# Patient Record
Sex: Female | Born: 1940 | Race: White | Hispanic: No | Smoking: Former smoker
Health system: Southern US, Community
[De-identification: ages and names within clinical notes are randomized; demographics above are authoritative.]

## PROBLEM LIST (undated history)

## (undated) DIAGNOSIS — I2510 Atherosclerotic heart disease of native coronary artery without angina pectoris: Secondary | ICD-10-CM

## (undated) DIAGNOSIS — Z9889 Other specified postprocedural states: Secondary | ICD-10-CM

## (undated) DIAGNOSIS — I701 Atherosclerosis of renal artery: Secondary | ICD-10-CM

## (undated) DIAGNOSIS — I739 Peripheral vascular disease, unspecified: Secondary | ICD-10-CM

## (undated) DIAGNOSIS — I471 Supraventricular tachycardia, unspecified: Secondary | ICD-10-CM

## (undated) DIAGNOSIS — E785 Hyperlipidemia, unspecified: Secondary | ICD-10-CM

## (undated) DIAGNOSIS — I774 Celiac artery compression syndrome: Secondary | ICD-10-CM

## (undated) DIAGNOSIS — I71019 Dissection of thoracic aorta, unspecified: Secondary | ICD-10-CM

## (undated) DIAGNOSIS — I714 Abdominal aortic aneurysm, without rupture, unspecified: Secondary | ICD-10-CM

## (undated) DIAGNOSIS — I771 Stricture of artery: Secondary | ICD-10-CM

## (undated) DIAGNOSIS — I10 Essential (primary) hypertension: Secondary | ICD-10-CM

## (undated) DIAGNOSIS — Z87891 Personal history of nicotine dependence: Secondary | ICD-10-CM

## (undated) DIAGNOSIS — I252 Old myocardial infarction: Secondary | ICD-10-CM

## (undated) DIAGNOSIS — I6522 Occlusion and stenosis of left carotid artery: Secondary | ICD-10-CM

## (undated) DIAGNOSIS — M1612 Unilateral primary osteoarthritis, left hip: Secondary | ICD-10-CM

## (undated) DIAGNOSIS — I5030 Unspecified diastolic (congestive) heart failure: Secondary | ICD-10-CM

## (undated) DIAGNOSIS — I7101 Dissection of thoracic aorta: Secondary | ICD-10-CM

## (undated) DIAGNOSIS — I340 Nonrheumatic mitral (valve) insufficiency: Secondary | ICD-10-CM

## (undated) HISTORY — DX: Nonrheumatic mitral (valve) insufficiency: I34.0

## (undated) HISTORY — DX: Personal history of nicotine dependence: Z87.891

## (undated) HISTORY — DX: Unspecified diastolic (congestive) heart failure: I50.30

## (undated) HISTORY — DX: Stricture of artery: I77.1

## (undated) HISTORY — DX: Occlusion and stenosis of left carotid artery: I65.22

## (undated) HISTORY — DX: Atherosclerosis of renal artery: I70.1

## (undated) HISTORY — DX: Hyperlipidemia, unspecified: E78.5

## (undated) HISTORY — DX: Celiac artery compression syndrome: I77.4

## (undated) HISTORY — PX: HIP SURGERY: SHX245

## (undated) HISTORY — DX: Supraventricular tachycardia, unspecified: I47.10

## (undated) HISTORY — PX: CARDIAC CATHETERIZATION: SHX172

## (undated) HISTORY — DX: Other specified postprocedural states: Z98.890

## (undated) HISTORY — DX: Old myocardial infarction: I25.2

## (undated) HISTORY — DX: Atherosclerotic heart disease of native coronary artery without angina pectoris: I25.10

## (undated) HISTORY — DX: Supraventricular tachycardia: I47.1

## (undated) HISTORY — PX: APPENDECTOMY: SHX54

## (undated) HISTORY — DX: Dissection of thoracic aorta, unspecified: I71.019

## (undated) HISTORY — DX: Unilateral primary osteoarthritis, left hip: M16.12

## (undated) HISTORY — DX: Dissection of thoracic aorta: I71.01

---

## 2003-11-16 ENCOUNTER — Emergency Department (HOSPITAL_COMMUNITY): Admission: EM | Admit: 2003-11-16 | Discharge: 2003-11-16 | Payer: Self-pay | Admitting: Emergency Medicine

## 2007-08-19 ENCOUNTER — Emergency Department (HOSPITAL_COMMUNITY): Admission: EM | Admit: 2007-08-19 | Discharge: 2007-08-19 | Payer: Self-pay | Admitting: Emergency Medicine

## 2008-08-19 ENCOUNTER — Emergency Department (HOSPITAL_COMMUNITY): Admission: EM | Admit: 2008-08-19 | Discharge: 2008-08-19 | Payer: Self-pay | Admitting: Emergency Medicine

## 2008-08-24 ENCOUNTER — Ambulatory Visit (HOSPITAL_COMMUNITY): Admission: RE | Admit: 2008-08-24 | Discharge: 2008-08-24 | Payer: Self-pay | Admitting: Urology

## 2008-09-13 ENCOUNTER — Ambulatory Visit (HOSPITAL_COMMUNITY): Admission: RE | Admit: 2008-09-13 | Discharge: 2008-09-13 | Payer: Self-pay | Admitting: Urology

## 2009-06-08 ENCOUNTER — Ambulatory Visit (HOSPITAL_COMMUNITY): Admission: RE | Admit: 2009-06-08 | Discharge: 2009-06-08 | Payer: Self-pay | Admitting: Urology

## 2009-10-10 ENCOUNTER — Ambulatory Visit (HOSPITAL_COMMUNITY): Admission: RE | Admit: 2009-10-10 | Discharge: 2009-10-10 | Payer: Self-pay | Admitting: Pulmonary Disease

## 2009-12-14 ENCOUNTER — Ambulatory Visit (HOSPITAL_COMMUNITY): Admission: RE | Admit: 2009-12-14 | Discharge: 2009-12-14 | Payer: Self-pay | Admitting: Urology

## 2010-11-20 ENCOUNTER — Other Ambulatory Visit (HOSPITAL_COMMUNITY): Payer: Self-pay | Admitting: Pulmonary Disease

## 2010-11-20 DIAGNOSIS — Z139 Encounter for screening, unspecified: Secondary | ICD-10-CM

## 2010-11-20 LAB — DIFFERENTIAL
Eosinophils Absolute: 0 10*3/uL (ref 0.0–0.7)
Eosinophils Relative: 1 % (ref 0–5)
Lymphs Abs: 0.2 10*3/uL — ABNORMAL LOW (ref 0.7–4.0)
Neutrophils Relative %: 92 % — ABNORMAL HIGH (ref 43–77)

## 2010-11-20 LAB — CBC
Hemoglobin: 14.9 g/dL (ref 12.0–15.0)
Platelets: 191 10*3/uL (ref 150–400)
RBC: 4.41 MIL/uL (ref 3.87–5.11)
RDW: 12.5 % (ref 11.5–15.5)

## 2010-11-20 LAB — BASIC METABOLIC PANEL
CO2: 25 mEq/L (ref 19–32)
Calcium: 9.5 mg/dL (ref 8.4–10.5)
Chloride: 106 mEq/L (ref 96–112)
Creatinine, Ser: 1.23 mg/dL — ABNORMAL HIGH (ref 0.4–1.2)
GFR calc Af Amer: 53 mL/min — ABNORMAL LOW (ref >60)
Potassium: 3.1 mEq/L — ABNORMAL LOW (ref 3.5–5.1)
Sodium: 141 mEq/L (ref 135–145)

## 2010-11-28 ENCOUNTER — Ambulatory Visit (HOSPITAL_COMMUNITY)
Admission: RE | Admit: 2010-11-28 | Discharge: 2010-11-28 | Disposition: A | Discharge status: Home / Self Care | Payer: Medicare Other | Source: Ambulatory Visit | Attending: Pulmonary Disease | Admitting: Pulmonary Disease

## 2010-11-28 DIAGNOSIS — Z139 Encounter for screening, unspecified: Secondary | ICD-10-CM

## 2010-11-28 DIAGNOSIS — Z1231 Encounter for screening mammogram for malignant neoplasm of breast: Secondary | ICD-10-CM | POA: Insufficient documentation

## 2011-01-01 ENCOUNTER — Emergency Department (HOSPITAL_COMMUNITY): Payer: Medicare Other

## 2011-01-01 ENCOUNTER — Emergency Department (HOSPITAL_COMMUNITY)
Admission: EM | Admit: 2011-01-01 | Discharge: 2011-01-02 | Disposition: A | Discharge status: Home / Self Care | Payer: Medicare Other | Attending: Emergency Medicine | Admitting: Emergency Medicine

## 2011-01-01 DIAGNOSIS — R1032 Left lower quadrant pain: Secondary | ICD-10-CM | POA: Insufficient documentation

## 2011-01-01 DIAGNOSIS — K4090 Unilateral inguinal hernia, without obstruction or gangrene, not specified as recurrent: Secondary | ICD-10-CM | POA: Insufficient documentation

## 2011-01-01 DIAGNOSIS — I10 Essential (primary) hypertension: Secondary | ICD-10-CM | POA: Insufficient documentation

## 2011-01-01 DIAGNOSIS — N1330 Unspecified hydronephrosis: Secondary | ICD-10-CM | POA: Insufficient documentation

## 2011-01-01 DIAGNOSIS — K5730 Diverticulosis of large intestine without perforation or abscess without bleeding: Secondary | ICD-10-CM | POA: Insufficient documentation

## 2011-01-01 DIAGNOSIS — Z8739 Personal history of other diseases of the musculoskeletal system and connective tissue: Secondary | ICD-10-CM | POA: Insufficient documentation

## 2011-01-01 DIAGNOSIS — Z79899 Other long term (current) drug therapy: Secondary | ICD-10-CM | POA: Insufficient documentation

## 2011-01-01 LAB — URINE MICROSCOPIC-ADD ON

## 2011-01-01 LAB — URINALYSIS, ROUTINE W REFLEX MICROSCOPIC
Bilirubin Urine: NEGATIVE
Glucose, UA: NEGATIVE mg/dL
Hgb urine dipstick: NEGATIVE
Nitrite: NEGATIVE
Specific Gravity, Urine: 1.015 (ref 1.005–1.030)
pH: 7 (ref 5.0–8.0)

## 2011-04-10 ENCOUNTER — Ambulatory Visit (HOSPITAL_COMMUNITY)
Admission: RE | Admit: 2011-04-10 | Discharge: 2011-04-10 | Disposition: A | Discharge status: Home / Self Care | Payer: Medicare Other | Source: Ambulatory Visit | Attending: Urology | Admitting: Urology

## 2011-04-10 ENCOUNTER — Other Ambulatory Visit (HOSPITAL_COMMUNITY): Payer: Self-pay | Admitting: Urology

## 2011-04-10 DIAGNOSIS — R1032 Left lower quadrant pain: Secondary | ICD-10-CM | POA: Insufficient documentation

## 2011-04-10 DIAGNOSIS — R109 Unspecified abdominal pain: Secondary | ICD-10-CM

## 2011-04-10 DIAGNOSIS — N200 Calculus of kidney: Secondary | ICD-10-CM | POA: Insufficient documentation

## 2011-04-10 DIAGNOSIS — M1610 Unilateral primary osteoarthritis, unspecified hip: Secondary | ICD-10-CM | POA: Insufficient documentation

## 2012-02-04 ENCOUNTER — Other Ambulatory Visit (HOSPITAL_COMMUNITY): Payer: Self-pay | Admitting: Pulmonary Disease

## 2012-02-04 DIAGNOSIS — R0989 Other specified symptoms and signs involving the circulatory and respiratory systems: Secondary | ICD-10-CM

## 2012-02-06 ENCOUNTER — Ambulatory Visit (HOSPITAL_COMMUNITY)
Admission: RE | Admit: 2012-02-06 | Discharge: 2012-02-06 | Disposition: A | Discharge status: Home / Self Care | Payer: Medicare Other | Source: Ambulatory Visit | Attending: Pulmonary Disease | Admitting: Pulmonary Disease

## 2012-02-06 DIAGNOSIS — I10 Essential (primary) hypertension: Secondary | ICD-10-CM | POA: Insufficient documentation

## 2012-02-06 DIAGNOSIS — R0989 Other specified symptoms and signs involving the circulatory and respiratory systems: Secondary | ICD-10-CM | POA: Insufficient documentation

## 2012-02-27 ENCOUNTER — Ambulatory Visit: Payer: Self-pay | Admitting: Vascular Surgery

## 2012-04-14 ENCOUNTER — Ambulatory Visit: Payer: Self-pay | Admitting: Vascular Surgery

## 2012-04-14 LAB — CBC WITH DIFFERENTIAL/PLATELET
Basophil %: 0.5 %
Eosinophil #: 0.1 10*3/uL (ref 0.0–0.7)
Eosinophil %: 2.2 %
HCT: 43.7 % (ref 35.0–47.0)
HGB: 14.6 g/dL (ref 12.0–16.0)
Lymphocyte %: 20.6 %
MCV: 101 fL — ABNORMAL HIGH (ref 80–100)
Monocyte #: 0.4 x10 3/mm (ref 0.2–0.9)
Monocyte %: 7 %
Neutrophil #: 4.1 10*3/uL (ref 1.4–6.5)
RDW: 12.4 % (ref 11.5–14.5)
WBC: 5.9 10*3/uL (ref 3.6–11.0)

## 2012-04-14 LAB — BASIC METABOLIC PANEL
Anion Gap: 3 — ABNORMAL LOW (ref 7–16)
BUN: 16 mg/dL (ref 7–18)
Creatinine: 0.87 mg/dL (ref 0.60–1.30)
EGFR (Non-African Amer.): >60
Glucose: 102 mg/dL — ABNORMAL HIGH (ref 65–99)
Osmolality: 279 (ref 275–301)

## 2012-04-17 ENCOUNTER — Inpatient Hospital Stay: Payer: Self-pay | Admitting: Vascular Surgery

## 2012-04-18 LAB — CBC WITH DIFFERENTIAL/PLATELET
Basophil %: 0.1 %
Eosinophil #: 0 10*3/uL (ref 0.0–0.7)
Eosinophil %: 0 %
HGB: 13.2 g/dL (ref 12.0–16.0)
Lymphocyte %: 8.6 %
Monocyte #: 0.5 x10 3/mm (ref 0.2–0.9)
Monocyte %: 5.7 %
Neutrophil %: 85.6 %
Platelet: 199 10*3/uL (ref 150–440)
RBC: 3.81 10*6/uL (ref 3.80–5.20)
WBC: 8.9 10*3/uL (ref 3.6–11.0)

## 2012-04-18 LAB — BASIC METABOLIC PANEL
Anion Gap: 7 (ref 7–16)
BUN: 12 mg/dL (ref 7–18)
Chloride: 109 mmol/L — ABNORMAL HIGH (ref 98–107)
Creatinine: 0.78 mg/dL (ref 0.60–1.30)
EGFR (African American): >60

## 2012-04-18 LAB — APTT: Activated PTT: 30.2 secs (ref 23.6–35.9)

## 2012-04-18 LAB — PROTIME-INR
INR: 0.9
Prothrombin Time: 12.7 secs (ref 11.5–14.7)

## 2012-09-08 ENCOUNTER — Ambulatory Visit: Payer: Self-pay | Admitting: Vascular Surgery

## 2012-09-08 LAB — CBC
HCT: 42.9 % (ref 35.0–47.0)
HGB: 14 g/dL (ref 12.0–16.0)
MCHC: 32.7 g/dL (ref 32.0–36.0)
MCV: 101 fL — ABNORMAL HIGH (ref 80–100)
Platelet: 231 10*3/uL (ref 150–440)
RBC: 4.26 10*6/uL (ref 3.80–5.20)
WBC: 4.3 10*3/uL (ref 3.6–11.0)

## 2012-09-08 LAB — BASIC METABOLIC PANEL
Anion Gap: 8 (ref 7–16)
Calcium, Total: 9 mg/dL (ref 8.5–10.1)
Chloride: 106 mmol/L (ref 98–107)
Co2: 27 mmol/L (ref 21–32)
Creatinine: 0.9 mg/dL (ref 0.60–1.30)
EGFR (African American): >60
Glucose: 96 mg/dL (ref 65–99)
Potassium: 4.3 mmol/L (ref 3.5–5.1)
Sodium: 141 mmol/L (ref 136–145)

## 2012-10-01 ENCOUNTER — Inpatient Hospital Stay: Payer: Self-pay | Admitting: Vascular Surgery

## 2012-10-02 LAB — CBC WITH DIFFERENTIAL/PLATELET
Basophil #: 0 10*3/uL (ref 0.0–0.1)
Basophil %: 0.2 %
Eosinophil #: 0 10*3/uL (ref 0.0–0.7)
Eosinophil %: 0.4 %
HCT: 44.6 % (ref 35.0–47.0)
Lymphocyte %: 16.3 %
MCH: 34.7 pg — ABNORMAL HIGH (ref 26.0–34.0)
MCHC: 34.4 g/dL (ref 32.0–36.0)
Monocyte #: 0.6 x10 3/mm (ref 0.2–0.9)
Neutrophil #: 7.6 10*3/uL — ABNORMAL HIGH (ref 1.4–6.5)
Neutrophil %: 76.7 %
Platelet: 262 10*3/uL (ref 150–440)
WBC: 9.9 10*3/uL (ref 3.6–11.0)

## 2012-10-02 LAB — BASIC METABOLIC PANEL
Anion Gap: 8 (ref 7–16)
BUN: 8 mg/dL (ref 7–18)
Calcium, Total: 8.7 mg/dL (ref 8.5–10.1)
Co2: 24 mmol/L (ref 21–32)
Glucose: 124 mg/dL — ABNORMAL HIGH (ref 65–99)
Osmolality: 274 (ref 275–301)
Potassium: 3.5 mmol/L (ref 3.5–5.1)

## 2012-10-02 LAB — PROTIME-INR: Prothrombin Time: 13 secs (ref 11.5–14.7)

## 2012-10-03 LAB — PATHOLOGY REPORT

## 2013-02-10 ENCOUNTER — Ambulatory Visit: Payer: Self-pay | Admitting: Vascular Surgery

## 2013-06-24 ENCOUNTER — Other Ambulatory Visit (HOSPITAL_COMMUNITY): Payer: Self-pay | Admitting: Pulmonary Disease

## 2013-06-24 DIAGNOSIS — Z139 Encounter for screening, unspecified: Secondary | ICD-10-CM

## 2013-06-29 ENCOUNTER — Ambulatory Visit (HOSPITAL_COMMUNITY)
Admission: RE | Admit: 2013-06-29 | Discharge: 2013-06-29 | Disposition: A | Discharge status: Home / Self Care | Payer: Medicare Other | Source: Ambulatory Visit | Attending: Pulmonary Disease | Admitting: Pulmonary Disease

## 2013-06-29 DIAGNOSIS — Z139 Encounter for screening, unspecified: Secondary | ICD-10-CM

## 2013-06-29 DIAGNOSIS — Z1231 Encounter for screening mammogram for malignant neoplasm of breast: Secondary | ICD-10-CM | POA: Insufficient documentation

## 2014-09-28 DIAGNOSIS — I714 Abdominal aortic aneurysm, without rupture: Secondary | ICD-10-CM | POA: Diagnosis not present

## 2014-09-28 DIAGNOSIS — K219 Gastro-esophageal reflux disease without esophagitis: Secondary | ICD-10-CM | POA: Diagnosis not present

## 2014-09-28 DIAGNOSIS — T8484XD Pain due to internal orthopedic prosthetic devices, implants and grafts, subsequent encounter: Secondary | ICD-10-CM | POA: Diagnosis not present

## 2014-09-28 DIAGNOSIS — I10 Essential (primary) hypertension: Secondary | ICD-10-CM | POA: Diagnosis not present

## 2014-11-23 NOTE — Op Note (Signed)
PATIENT NAME:  Elizabeth, [name] MR#:  [id number] DATE OF BIRTH:  10-28-1940  DATE OF PROCEDURE:  04/17/2012  PREOPERATIVE DIAGNOSES:  1. Bilateral carotid artery stenosis, left worse than right.  2. Hypertension.  3. Hyperlipidemia.   POSTOPERATIVE DIAGNOSES:  1. Bilateral carotid artery stenosis, left worse than right.  2. Hypertension.  3. Hyperlipidemia.   PROCEDURE PERFORMED:  Left carotid endarterectomy.   SURGEON: Annice Needy, MD   ANESTHESIA: General.   ESTIMATED BLOOD LOSS: 100 mL.   INDICATION FOR PROCEDURE: The patient is a 74 year old white female who was referred to me for evaluation of carotid artery stenosis. She had a CT scan which showed an approximately 80% left internal carotid artery stenosis and a nearly as bad right common carotid artery stenosis at the carotid bulb. With the left side being the dominant hemisphere and being the worse stenosis, this was elected to be repaired first;  and we will reassess her for repair of the right carotid artery following the left carotid surgery. The risks and benefits were discussed including stroke, heart attack, cardiopulmonary complications, and other issues including cranial nerve injury. Informed consent was obtained.   DESCRIPTION OF PROCEDURE: The patient was brought to the operative suite, and after an adequate level of general anesthesia was obtained the left neck and chest were sterilely prepped and draped and a sterile surgical field was created. She was placed in a modified beach chair position. She was sterilely prepped and draped and a sterile surgical field was created. An incision was created along the lateral border of the sternocleidomastoid, and we dissected out to the carotid artery ligating the facial vein between silk ties. A Weitlaner retractor was used to help facilitate our exposure. She was reasonably superficial and her common, external and internal carotid arteries were easily dissected out as was the  superior thyroid artery and circled with vessel loops. The patient was given 5000 units of intravenous heparin for systemic anticoagulation. She had a surprisingly large size caliber internal carotid artery for a smaller woman; which, for this reason, a primary repair was selected. The vessel loop was pulled up and control was obtained. The Pruitt-Inahara shunt was placed first in the internal carotid artery and flushed there, and then the common carotid artery, and approximately two minutes elapsed between clamping and shunt placement with restoration of flow. An endarterectomy was then performed in the typical fashion. An eversion endarterectomy was performed on the external carotid artery. The common carotid artery endpoint was cut flush with tenotomy scissors, and a nice feathered distal endpoint was created. Three 7-0 Prolene tacking sutures were used to tack down the distal endpoint and all loose flecks were removed. The vessel was locally heparinized. The suture line was run with two 6-0 Prolene sutures, first started at the distal endpoint and run to the halfway portion and then started at the proximal endpoint. The shunt was removed. The suture line was completed and flushed at the external carotid artery for several cardiac cycles prior to release of control. Two 6-0 Prolene patched were used for hemostasis, and hemostasis was complete. At the end there was nice pulse within the internal carotid artery, no bleeding. The wound was irrigated. Surgicel was placed. Three interrupted 3-0 sutures were placed in the sternocleidomastoid space. The platysma was closed with a running 3-0 Vicryl, and the skin was closed with 4-0 Monocryl. Dermabond was placed as a dressing. The patient tolerated the procedure well and was taken to the recovery room in stable  condition.  ____________________________ Annice NeedyJason S. Gryphon Vanderveen, MD jsd:cbb D: 04/17/2012 12:44:14 ET T: 04/17/2012 13:16:57 ET JOB#: [id number]  cc: Annice NeedyJason S. Carlina Derks, MD,  <Dictator> Annice NeedyJASON S Marrion Accomando MD ELECTRONICALLY SIGNED 04/21/2012 9:00

## 2014-11-26 NOTE — Op Note (Signed)
PATIENT NAME:  Elizabeth BasqueERRELL, Rosmarie M MR#:  [id number] DATE OF BIRTH:  January 20, 1941  DATE OF PROCEDURE:  10/01/2012  PREOPERATIVE DIAGNOSES:   1.  Carotid artery stenosis.  2.  Hypertension.  3.  Hyperlipidemia.  4.  Need for invasive blood pressure monitoring intraoperatively.   POSTOPERATIVE DIAGNOSIS:   1.  Carotid artery stenosis.  2.  Hypertension.  3.  Hyperlipidemia.  4.  Need for invasive blood pressure monitoring intraoperatively.   PROCEDURES:   1.  Ultrasound guidance for vascular access, left radial artery.  2.  Placement of left radial arterial line.   SURGEON:  Annice NeedyJason S. Crystalann Korf, MD  ANESTHESIA:  None.   ESTIMATED BLOOD LOSS:  Minimal.   INDICATION FOR PROCEDURE:  This is a 74 year old white female who is going to have a carotid endarterectomy. She has already been anesthetized by the anesthesiologist. They were unable to place an arterial line as part of their workup and have asked me perform this. This will allow invasive blood pressure monitoring intraoperatively and postoperatively.   DESCRIPTION OF PROCEDURE:  The patient's left wrist was prepped. Ultrasound was used to visualize a patent left radial artery. It was then accessed with direct ultrasound guidance with a micropuncture needle and micropuncture wire and a 5 French sheath were then placed. A 5 French sheath was then secured to the skin and used as an invasive arterial line for monitoring of her blood pressure.    ____________________________ Annice NeedyJason S. Yohanna Tow, MD jsd:si D: 10/01/2012 14:48:44 ET T: 10/01/2012 15:08:25 ET JOB#: [id number]  cc: Annice NeedyJason S. Elcie Pelster, MD, <Dictator> Annice NeedyJASON S Akio Hudnall MD ELECTRONICALLY SIGNED 10/02/2012 15:42

## 2014-11-26 NOTE — Op Note (Signed)
PATIENT NAME:  Elizabeth [name], Elizabeth [name] MR#:  [id number] DATE OF BIRTH:  02/28/1941  DATE OF PROCEDURE:  10/01/2012  PREOPERATIVE DIAGNOSES: 1.  Bilateral carotid artery stenosis.  2.  Status post left carotid endarterectomy.  3.  Hypertension.  4.  Hyperlipidemia.   POSTOPERATIVE DIAGNOSES: 1.  Bilateral carotid artery stenosis.  2.  Status post left carotid endarterectomy.  3.  Hypertension.  4.  Hyperlipidemia.   PROCEDURE: Right carotid endarterectomy with CorMatrix arterial defect reconstruction.   SURGEON: Annice Needy, M.D.   ANESTHESIA: General.   ESTIMATED BLOOD LOSS: Approximately 25 mL.   INDICATION FOR PROCEDURE: This is a 74 year old female with history of carotid artery stenosis. She was found several months ago to have bilateral high-grade carotid artery stenosis. She is status post left carotid endarterectomy about 5 months ago. She has recovered well from this and is ready to proceed with right carotid endarterectomy for stroke risk reduction. Risks including stroke, heart attack, cranial nerve injury, bleeding and infection were all discussed with the patient and informed consent was obtained.   DESCRIPTION OF PROCEDURE: The patient was brought to the operative suite and, after an adequate level of general anesthesia was obtained, the patient was placed in a modified beach chair position, a roll was placed under her shoulder and her neck was flexed and turned to the left. Her neck was then sterilely prepped and draped and a sterile surgical field was created. After appropriate surgical timeout and intravenous antibiotics, an incision was created along the anterior border of the sternocleidomastoid. We dissected down to the platysma with electrocautery. The sternocleidomastoid was dissected laterally. This exposed the facial vein, which ligated and divided between silk ties exposing the carotid artery. The common carotid artery, external carotid artery, superior thyroid artery and  internal carotid artery distal to the lesion were all encircled with vessel loops. The patient was systemically heparinized. This was allowed to circulate for several minutes then control was pulled up on the vessel loops. An anterior wall arteriotomy was created with an 11 blade and extended with Potts scissors. The Pruitt-Inahara shunt was then placed, first in the internal carotid artery, flushed and de-aired, then in the common carotid artery, flushed and de-aired, and then flow was restored to the brain. I then performed endarterectomy in the standard fashion. The proximal endpoint was cut flush with tenotomy scissors. An eversion endarterectomy was performed on the external carotid artery a nice feathered endpoint was created, on the internal carotid artery, with gentle traction. This was a heavily calcific lesion. Several loose flecks were removed and the vessel was locally heparinized. Due to the smaller size of this artery, an arterial reconstruction was required with a patch and a CorMatrix patch was selected. A 6-0 Prolene suture was started at the distal endpoint, after the patch had been cut and beveled. It was run approximately one half the length of the arteriotomy. I then cut and beveled the patch to match the arteriotomy and started a second 6-0 Prolene, at the proximal endpoint. The medial suture line was run and tied together. The lateral suture line was run approximately one quarter length of the arteriotomy. The Pruitt-Inahara shunt was then removed. The vessel was flushed. The external, internal and common carotid arteries were locally heparinized. The arteriotomy was then completed allowing flushing through the external carotid artery allowing several cardiac cycles to traverse up the external carotid artery prior to releasing control in the internal carotid artery. On release of control, there was a nice  pulse within the internal carotid artery distal to the patch. Two 6-0 Prolene patch  sutures were used for hemostasis and hemostasis was complete. Evicyl topical hemostatic agent was placed. After irrigation the wound was then closed with 3 interrupted 3-0 Vicryl in the sternocleidomastoid space. The platysma was closed with a running 3-0 Vicryl and the skin was closed with a 4-0 Monocryl. Dermabond was placed as a dressing. The patient tolerated the procedure well and was taken to the recovery room in stable condition. ____________________________ Annice NeedyJason S. Ninamarie Keel, MD jsd:sb D: 10/01/2012 10:05:23 ET T: 10/01/2012 10:24:25 ET JOB#: [id number]  cc: Annice NeedyJason S. Stanislawa Gaffin, MD, <Dictator> Kari BaarsEdward Hawkins, MD Annice NeedyJASON S Kerissa Coia MD ELECTRONICALLY SIGNED 10/01/2012 15:17

## 2014-11-26 NOTE — Discharge Summary (Signed)
PATIENT NAME:  Elizabeth [name], Janai M MR#:  [id number] DATE OF BIRTH:  Dec 15, 1940  DATE OF ADMISSION:  10/01/2012 DATE OF DISCHARGE:  10/02/2012  ADMITTING AND DISCHARGE DIAGNOSES:   1.  High-grade right carotid artery stenosis.  2.  Hypertension.  3.  Left carotid artery stenosis status post endarterectomy.   PROCEDURE PERFORMED WHILE IN THE HOSPITAL:  Right carotid endarterectomy.   BRIEF HISTORY:  A [age] year old white female with extensive vascular disease. She has previously undergone a left carotid endarterectomy some 5 to 6 months ago. She has high-grade right carotid artery stenosis and is now ready to have this repaired for stroke risk reduction.   HOSPITAL COURSE:  The patient was admitted to the hospital for same-day surgery and taken to the operating room where a right carotid endarterectomy was performed. She did well in the perioperative period. She was admitted to the Critical Care Unit overnight for observation. By postoperative day #1, she was able to tolerate a diet. Her blood pressure and heart rate were within a reasonable range. She had no significant neck swelling. No significant pain. She was swallowing well and neurologically intact. She will be discharged home accompanied by her family. Her diet is regular. Activity is as tolerated. She will resume all her previous home medications and she will follow up in our office in 3 to 4 weeks.    ____________________________ Annice NeedyJason S. Kaylenn Civil, MD jsd:si D: 10/02/2012 18:03:46 ET T: 10/02/2012 21:29:07 ET JOB#: [id number]  cc: Annice NeedyJason S. Marsella Suman, MD, <Dictator> Annice NeedyJASON S Gerardo Caiazzo MD ELECTRONICALLY SIGNED 10/09/2012 9:40

## 2014-12-08 DIAGNOSIS — I10 Essential (primary) hypertension: Secondary | ICD-10-CM | POA: Diagnosis not present

## 2014-12-08 DIAGNOSIS — I714 Abdominal aortic aneurysm, without rupture: Secondary | ICD-10-CM | POA: Diagnosis not present

## 2014-12-08 DIAGNOSIS — E785 Hyperlipidemia, unspecified: Secondary | ICD-10-CM | POA: Diagnosis not present

## 2014-12-08 DIAGNOSIS — I6529 Occlusion and stenosis of unspecified carotid artery: Secondary | ICD-10-CM | POA: Diagnosis not present

## 2015-01-27 DIAGNOSIS — I779 Disorder of arteries and arterioles, unspecified: Secondary | ICD-10-CM | POA: Diagnosis not present

## 2015-01-27 DIAGNOSIS — I10 Essential (primary) hypertension: Secondary | ICD-10-CM | POA: Diagnosis not present

## 2015-01-27 DIAGNOSIS — I714 Abdominal aortic aneurysm, without rupture: Secondary | ICD-10-CM | POA: Diagnosis not present

## 2015-01-27 DIAGNOSIS — T8484XD Pain due to internal orthopedic prosthetic devices, implants and grafts, subsequent encounter: Secondary | ICD-10-CM | POA: Diagnosis not present

## 2015-03-14 DIAGNOSIS — I714 Abdominal aortic aneurysm, without rupture: Secondary | ICD-10-CM | POA: Diagnosis not present

## 2015-03-14 DIAGNOSIS — M79609 Pain in unspecified limb: Secondary | ICD-10-CM | POA: Diagnosis not present

## 2015-03-14 DIAGNOSIS — I10 Essential (primary) hypertension: Secondary | ICD-10-CM | POA: Diagnosis not present

## 2015-03-14 DIAGNOSIS — E785 Hyperlipidemia, unspecified: Secondary | ICD-10-CM | POA: Diagnosis not present

## 2015-03-14 DIAGNOSIS — I6523 Occlusion and stenosis of bilateral carotid arteries: Secondary | ICD-10-CM | POA: Diagnosis not present

## 2015-04-29 DIAGNOSIS — H2513 Age-related nuclear cataract, bilateral: Secondary | ICD-10-CM | POA: Diagnosis not present

## 2015-04-29 DIAGNOSIS — H18413 Arcus senilis, bilateral: Secondary | ICD-10-CM | POA: Diagnosis not present

## 2015-04-29 DIAGNOSIS — I10 Essential (primary) hypertension: Secondary | ICD-10-CM | POA: Diagnosis not present

## 2015-04-29 DIAGNOSIS — H02839 Dermatochalasis of unspecified eye, unspecified eyelid: Secondary | ICD-10-CM | POA: Diagnosis not present

## 2015-05-30 DIAGNOSIS — I714 Abdominal aortic aneurysm, without rupture: Secondary | ICD-10-CM | POA: Diagnosis not present

## 2015-05-30 DIAGNOSIS — I779 Disorder of arteries and arterioles, unspecified: Secondary | ICD-10-CM | POA: Diagnosis not present

## 2015-05-30 DIAGNOSIS — T8484XD Pain due to internal orthopedic prosthetic devices, implants and grafts, subsequent encounter: Secondary | ICD-10-CM | POA: Diagnosis not present

## 2015-05-30 DIAGNOSIS — I10 Essential (primary) hypertension: Secondary | ICD-10-CM | POA: Diagnosis not present

## 2015-06-02 DIAGNOSIS — H2512 Age-related nuclear cataract, left eye: Secondary | ICD-10-CM | POA: Diagnosis not present

## 2015-06-13 DIAGNOSIS — Z961 Presence of intraocular lens: Secondary | ICD-10-CM | POA: Diagnosis not present

## 2015-06-13 DIAGNOSIS — H2512 Age-related nuclear cataract, left eye: Secondary | ICD-10-CM | POA: Diagnosis not present

## 2015-06-13 DIAGNOSIS — H25812 Combined forms of age-related cataract, left eye: Secondary | ICD-10-CM | POA: Diagnosis not present

## 2015-06-14 DIAGNOSIS — H25011 Cortical age-related cataract, right eye: Secondary | ICD-10-CM | POA: Diagnosis not present

## 2015-06-14 DIAGNOSIS — H2511 Age-related nuclear cataract, right eye: Secondary | ICD-10-CM | POA: Diagnosis not present

## 2015-06-14 DIAGNOSIS — H25041 Posterior subcapsular polar age-related cataract, right eye: Secondary | ICD-10-CM | POA: Diagnosis not present

## 2015-07-04 DIAGNOSIS — H2512 Age-related nuclear cataract, left eye: Secondary | ICD-10-CM | POA: Diagnosis not present

## 2015-07-04 DIAGNOSIS — H25811 Combined forms of age-related cataract, right eye: Secondary | ICD-10-CM | POA: Diagnosis not present

## 2015-07-04 DIAGNOSIS — Z961 Presence of intraocular lens: Secondary | ICD-10-CM | POA: Diagnosis not present

## 2015-07-04 DIAGNOSIS — H25813 Combined forms of age-related cataract, bilateral: Secondary | ICD-10-CM | POA: Diagnosis not present

## 2015-07-04 DIAGNOSIS — H2511 Age-related nuclear cataract, right eye: Secondary | ICD-10-CM | POA: Diagnosis not present

## 2015-10-04 DIAGNOSIS — K210 Gastro-esophageal reflux disease with esophagitis: Secondary | ICD-10-CM | POA: Diagnosis not present

## 2015-10-04 DIAGNOSIS — T8484XD Pain due to internal orthopedic prosthetic devices, implants and grafts, subsequent encounter: Secondary | ICD-10-CM | POA: Diagnosis not present

## 2015-10-04 DIAGNOSIS — I779 Disorder of arteries and arterioles, unspecified: Secondary | ICD-10-CM | POA: Diagnosis not present

## 2015-10-04 DIAGNOSIS — I10 Essential (primary) hypertension: Secondary | ICD-10-CM | POA: Diagnosis not present

## 2015-11-14 DIAGNOSIS — M79609 Pain in unspecified limb: Secondary | ICD-10-CM | POA: Diagnosis not present

## 2015-11-14 DIAGNOSIS — E785 Hyperlipidemia, unspecified: Secondary | ICD-10-CM | POA: Diagnosis not present

## 2015-12-20 DIAGNOSIS — I10 Essential (primary) hypertension: Secondary | ICD-10-CM | POA: Diagnosis not present

## 2015-12-20 DIAGNOSIS — M79609 Pain in unspecified limb: Secondary | ICD-10-CM | POA: Diagnosis not present

## 2016-01-25 ENCOUNTER — Other Ambulatory Visit: Payer: Self-pay | Admitting: Vascular Surgery

## 2016-01-25 DIAGNOSIS — I10 Essential (primary) hypertension: Secondary | ICD-10-CM | POA: Diagnosis not present

## 2016-01-25 DIAGNOSIS — E785 Hyperlipidemia, unspecified: Secondary | ICD-10-CM | POA: Diagnosis not present

## 2016-01-25 DIAGNOSIS — M79609 Pain in unspecified limb: Secondary | ICD-10-CM | POA: Diagnosis not present

## 2016-01-25 DIAGNOSIS — I714 Abdominal aortic aneurysm, without rupture: Secondary | ICD-10-CM | POA: Diagnosis not present

## 2016-01-31 ENCOUNTER — Other Ambulatory Visit
Admission: RE | Admit: 2016-01-31 | Discharge: 2016-01-31 | Disposition: A | Discharge status: Home / Self Care | Payer: Medicare Other | Source: Ambulatory Visit | Attending: Vascular Surgery | Admitting: Vascular Surgery

## 2016-01-31 DIAGNOSIS — I739 Peripheral vascular disease, unspecified: Secondary | ICD-10-CM | POA: Insufficient documentation

## 2016-01-31 LAB — BUN: BUN: 20 mg/dL (ref 6–20)

## 2016-01-31 LAB — CREATININE, SERUM
CREATININE: 0.96 mg/dL (ref 0.44–1.00)
GFR calc Af Amer: >60 mL/min (ref >60)
GFR calc non Af Amer: 57 mL/min — ABNORMAL LOW (ref >60)

## 2016-02-02 ENCOUNTER — Encounter
Admission: RE | Disposition: A | Discharge status: Home / Self Care | Payer: Self-pay | Source: Ambulatory Visit | Attending: Vascular Surgery

## 2016-02-02 ENCOUNTER — Observation Stay
Admission: RE | Admit: 2016-02-02 | Discharge: 2016-02-03 | Disposition: A | Discharge status: Home / Self Care | Payer: Medicare Other | Source: Ambulatory Visit | Attending: Vascular Surgery | Admitting: Vascular Surgery

## 2016-02-02 DIAGNOSIS — I6523 Occlusion and stenosis of bilateral carotid arteries: Secondary | ICD-10-CM | POA: Insufficient documentation

## 2016-02-02 DIAGNOSIS — I70213 Atherosclerosis of native arteries of extremities with intermittent claudication, bilateral legs: Principal | ICD-10-CM | POA: Insufficient documentation

## 2016-02-02 DIAGNOSIS — Z87891 Personal history of nicotine dependence: Secondary | ICD-10-CM | POA: Diagnosis not present

## 2016-02-02 DIAGNOSIS — E785 Hyperlipidemia, unspecified: Secondary | ICD-10-CM | POA: Diagnosis not present

## 2016-02-02 DIAGNOSIS — Z7982 Long term (current) use of aspirin: Secondary | ICD-10-CM | POA: Diagnosis not present

## 2016-02-02 DIAGNOSIS — I771 Stricture of artery: Secondary | ICD-10-CM | POA: Insufficient documentation

## 2016-02-02 DIAGNOSIS — R0989 Other specified symptoms and signs involving the circulatory and respiratory systems: Secondary | ICD-10-CM | POA: Diagnosis not present

## 2016-02-02 DIAGNOSIS — I714 Abdominal aortic aneurysm, without rupture, unspecified: Secondary | ICD-10-CM | POA: Diagnosis present

## 2016-02-02 DIAGNOSIS — Z7902 Long term (current) use of antithrombotics/antiplatelets: Secondary | ICD-10-CM | POA: Insufficient documentation

## 2016-02-02 DIAGNOSIS — Z96649 Presence of unspecified artificial hip joint: Secondary | ICD-10-CM | POA: Diagnosis not present

## 2016-02-02 DIAGNOSIS — I10 Essential (primary) hypertension: Secondary | ICD-10-CM | POA: Diagnosis not present

## 2016-02-02 DIAGNOSIS — Z79899 Other long term (current) drug therapy: Secondary | ICD-10-CM | POA: Diagnosis not present

## 2016-02-02 HISTORY — DX: Essential (primary) hypertension: I10

## 2016-02-02 HISTORY — DX: Peripheral vascular disease, unspecified: I73.9

## 2016-02-02 HISTORY — PX: PERIPHERAL VASCULAR CATHETERIZATION: SHX172C

## 2016-02-02 LAB — CREATININE, SERUM
Creatinine, Ser: 0.74 mg/dL (ref 0.44–1.00)
GFR calc Af Amer: >60 mL/min (ref >60)
GFR calc non Af Amer: >60 mL/min (ref >60)

## 2016-02-02 LAB — BUN: BUN: 16 mg/dL (ref 6–20)

## 2016-02-02 SURGERY — LOWER EXTREMITY ANGIOGRAPHY
Anesthesia: Moderate Sedation | Site: Leg Lower | Laterality: Right

## 2016-02-02 MED ORDER — METOPROLOL TARTRATE 5 MG/5ML IV SOLN: 2.0000 mg | INTRAVENOUS | Status: DC | PRN

## 2016-02-02 MED ORDER — PRAVASTATIN SODIUM 40 MG PO TABS: 40.0000 mg | ORAL_TABLET | Freq: Every day | ORAL | Status: DC

## 2016-02-02 MED ORDER — IOPAMIDOL (ISOVUE-300) INJECTION 61%
INTRAVENOUS | Status: DC | PRN
  Administered 2016-02-02: 120 mL via INTRAVENOUS

## 2016-02-02 MED ORDER — ONDANSETRON HCL 4 MG PO TABS: 4.0000 mg | ORAL_TABLET | Freq: Four times a day (QID) | ORAL | Status: DC | PRN

## 2016-02-02 MED ORDER — FENTANYL CITRATE (PF) 100 MCG/2ML IJ SOLN
INTRAMUSCULAR | Status: DC | PRN
  Administered 2016-02-02: 25 ug via INTRAVENOUS
  Administered 2016-02-02: 50 ug via INTRAVENOUS
  Administered 2016-02-02: 25 ug via INTRAVENOUS
  Administered 2016-02-02 (×2): 50 ug via INTRAVENOUS
  Administered 2016-02-02: 25 ug via INTRAVENOUS

## 2016-02-02 MED ORDER — ONDANSETRON HCL 4 MG/2ML IJ SOLN: 4.0000 mg | Freq: Four times a day (QID) | INTRAMUSCULAR | Status: DC | PRN

## 2016-02-02 MED ORDER — ACETAMINOPHEN 650 MG RE SUPP: 325.0000 mg | RECTAL | Status: DC | PRN

## 2016-02-02 MED ORDER — GUAIFENESIN-DM 100-10 MG/5ML PO SYRP: 15.0000 mL | ORAL_SOLUTION | ORAL | Status: DC | PRN

## 2016-02-02 MED ORDER — OXYCODONE-ACETAMINOPHEN 5-325 MG PO TABS
1.0000 | ORAL_TABLET | ORAL | Status: DC | PRN
  Administered 2016-02-02 – 2016-02-03 (×5): 2 via ORAL
  Filled 2016-02-02 (×2): qty 2
  Filled 2016-02-02 (×2): qty 1
  Filled 2016-02-02 (×2): qty 2

## 2016-02-02 MED ORDER — FENTANYL CITRATE (PF) 100 MCG/2ML IJ SOLN
INTRAMUSCULAR | Status: AC
  Filled 2016-02-02: qty 2

## 2016-02-02 MED ORDER — MIDAZOLAM HCL 5 MG/5ML IJ SOLN
INTRAMUSCULAR | Status: AC
  Filled 2016-02-02: qty 5

## 2016-02-02 MED ORDER — CLOPIDOGREL BISULFATE 75 MG PO TABS
75.0000 mg | ORAL_TABLET | Freq: Every day | ORAL | Status: DC
  Administered 2016-02-03: 75 mg via ORAL
  Filled 2016-02-02: qty 1

## 2016-02-02 MED ORDER — DEXTROSE 5 % IV SOLN: 1.5000 g | INTRAVENOUS | Status: DC

## 2016-02-02 MED ORDER — METHYLPREDNISOLONE SODIUM SUCC 125 MG IJ SOLR: 125.0000 mg | INTRAMUSCULAR | Status: DC | PRN

## 2016-02-02 MED ORDER — DIPHENHYDRAMINE HCL 50 MG/ML IJ SOLN
INTRAMUSCULAR | Status: DC | PRN
  Administered 2016-02-02: 50 mg via INTRAVENOUS

## 2016-02-02 MED ORDER — AMLODIPINE BESYLATE 10 MG PO TABS
10.0000 mg | ORAL_TABLET | Freq: Every day | ORAL | Status: DC
  Administered 2016-02-03: 10 mg via ORAL
  Filled 2016-02-02: qty 1

## 2016-02-02 MED ORDER — DIPHENHYDRAMINE HCL 50 MG/ML IJ SOLN
INTRAMUSCULAR | Status: AC
  Filled 2016-02-02: qty 1

## 2016-02-02 MED ORDER — SODIUM CHLORIDE 0.9 % IV SOLN: INTRAVENOUS | Status: DC

## 2016-02-02 MED ORDER — HEPARIN (PORCINE) IN NACL 2-0.9 UNIT/ML-% IJ SOLN
INTRAMUSCULAR | Status: AC
  Filled 2016-02-02: qty 1000

## 2016-02-02 MED ORDER — DEXTROSE-NACL 5-0.9 % IV SOLN
INTRAVENOUS | Status: DC
  Administered 2016-02-02: 16:00:00 via INTRAVENOUS

## 2016-02-02 MED ORDER — HEPARIN SODIUM (PORCINE) 1000 UNIT/ML IJ SOLN
INTRAMUSCULAR | Status: AC
  Filled 2016-02-02: qty 1

## 2016-02-02 MED ORDER — HYDROMORPHONE HCL 1 MG/ML IJ SOLN: 1.0000 mg | Freq: Once | INTRAMUSCULAR | Status: DC

## 2016-02-02 MED ORDER — SORBITOL 70 % SOLN
30.0000 mL | Freq: Every day | Status: DC | PRN
  Filled 2016-02-02: qty 30

## 2016-02-02 MED ORDER — PNEUMOCOCCAL VAC POLYVALENT 25 MCG/0.5ML IJ INJ: 0.5000 mL | INJECTION | INTRAMUSCULAR | Status: DC

## 2016-02-02 MED ORDER — PHENOL 1.4 % MT LIQD
1.0000 | OROMUCOSAL | Status: DC | PRN
  Filled 2016-02-02: qty 177

## 2016-02-02 MED ORDER — FAMOTIDINE 20 MG PO TABS: 40.0000 mg | ORAL_TABLET | ORAL | Status: DC | PRN

## 2016-02-02 MED ORDER — MORPHINE SULFATE (PF) 4 MG/ML IV SOLN: 2.0000 mg | INTRAVENOUS | Status: DC | PRN

## 2016-02-02 MED ORDER — LABETALOL HCL 5 MG/ML IV SOLN: 10.0000 mg | INTRAVENOUS | Status: DC | PRN

## 2016-02-02 MED ORDER — TRAMADOL HCL 50 MG PO TABS: 50.0000 mg | ORAL_TABLET | Freq: Four times a day (QID) | ORAL | Status: DC | PRN

## 2016-02-02 MED ORDER — SODIUM CHLORIDE 0.9 % IV SOLN: 500.0000 mL | Freq: Once | INTRAVENOUS | Status: DC | PRN

## 2016-02-02 MED ORDER — HYDRALAZINE HCL 20 MG/ML IJ SOLN: 5.0000 mg | INTRAMUSCULAR | Status: DC | PRN

## 2016-02-02 MED ORDER — HEPARIN SODIUM (PORCINE) 1000 UNIT/ML IJ SOLN
INTRAMUSCULAR | Status: AC
Start: 2016-02-02 — End: 2016-02-02
  Filled 2016-02-02: qty 1

## 2016-02-02 MED ORDER — METOPROLOL TARTRATE 50 MG PO TABS
50.0000 mg | ORAL_TABLET | Freq: Two times a day (BID) | ORAL | Status: DC
  Administered 2016-02-02 – 2016-02-03 (×2): 50 mg via ORAL
  Filled 2016-02-02 (×2): qty 1

## 2016-02-02 MED ORDER — ACETAMINOPHEN 650 MG RE SUPP: 650.0000 mg | Freq: Four times a day (QID) | RECTAL | Status: DC | PRN

## 2016-02-02 MED ORDER — HYDROMORPHONE HCL 1 MG/ML IJ SOLN: 0.5000 mg | INTRAMUSCULAR | Status: DC | PRN

## 2016-02-02 MED ORDER — LIDOCAINE-EPINEPHRINE (PF) 1 %-1:200000 IJ SOLN
INTRAMUSCULAR | Status: AC
  Filled 2016-02-02: qty 30

## 2016-02-02 MED ORDER — ACETAMINOPHEN 325 MG PO TABS: 325.0000 mg | ORAL_TABLET | ORAL | Status: DC | PRN

## 2016-02-02 MED ORDER — DEXTROSE 5 % IV SOLN
1.5000 g | INTRAVENOUS | Status: AC
  Administered 2016-02-02: 1.5 g via INTRAVENOUS

## 2016-02-02 MED ORDER — MIDAZOLAM HCL 2 MG/2ML IJ SOLN
INTRAMUSCULAR | Status: DC | PRN
  Administered 2016-02-02 (×6): 2 mg via INTRAVENOUS

## 2016-02-02 MED ORDER — ASPIRIN EC 81 MG PO TBEC
81.0000 mg | DELAYED_RELEASE_TABLET | Freq: Every day | ORAL | Status: DC
  Administered 2016-02-03: 81 mg via ORAL
  Filled 2016-02-02: qty 1

## 2016-02-02 MED ORDER — DOCUSATE SODIUM 100 MG PO CAPS
100.0000 mg | ORAL_CAPSULE | Freq: Two times a day (BID) | ORAL | Status: DC
  Administered 2016-02-03: 100 mg via ORAL
  Filled 2016-02-02: qty 1

## 2016-02-02 MED ORDER — MAGNESIUM HYDROXIDE 400 MG/5ML PO SUSP: 30.0000 mL | Freq: Every day | ORAL | Status: DC | PRN

## 2016-02-02 MED ORDER — ACETAMINOPHEN 325 MG PO TABS: 650.0000 mg | ORAL_TABLET | Freq: Four times a day (QID) | ORAL | Status: DC | PRN

## 2016-02-02 MED ORDER — HEPARIN SODIUM (PORCINE) 1000 UNIT/ML IJ SOLN
INTRAMUSCULAR | Status: DC | PRN
  Administered 2016-02-02: 3000 [IU] via INTRAVENOUS
  Administered 2016-02-02: 1500 [IU] via INTRAVENOUS

## 2016-02-02 MED ORDER — FLEET ENEMA 7-19 GM/118ML RE ENEM: 1.0000 | ENEMA | Freq: Once | RECTAL | Status: DC | PRN

## 2016-02-02 SURGICAL SUPPLY — 22 items
BALLN LUTONIX DCB 5X40X130 (BALLOONS) ×2
BALLN LUTONIX DCB 5X60X130 (BALLOONS) ×2
BALLN ULTRVRSE 6X80X75 (BALLOONS) ×2
BALLN ULTRVRSE 7X60X75C (BALLOONS) ×2
BALLOON LUTONIX DCB 5X40X130 (BALLOONS) ×1 IMPLANT
BALLOON LUTONIX DCB 5X60X130 (BALLOONS) ×1 IMPLANT
BALLOON ULTRVRSE 6X80X75 (BALLOONS) ×1 IMPLANT
BALLOON ULTRVRSE 7X60X75C (BALLOONS) ×1 IMPLANT
CATH PIG 70CM (CATHETERS) ×2 IMPLANT
CATH TORCON 5FR 0.38 (CATHETERS) ×2 IMPLANT
DEVICE PRESTO INFLATION (MISCELLANEOUS) ×2 IMPLANT
DEVICE STARCLOSE SE CLOSURE (Vascular Products) ×4 IMPLANT
DEVICE TORQUE (MISCELLANEOUS) ×2 IMPLANT
GLIDEWIRE STIFF .35X180X3 HYDR (WIRE) ×2 IMPLANT
PACK ANGIOGRAPHY (CUSTOM PROCEDURE TRAY) ×2 IMPLANT
SHEATH BRITE TIP 5FRX11 (SHEATH) ×2 IMPLANT
SHEATH BRITE TIP 6FRX11 (SHEATH) ×2 IMPLANT
STENT LIFESTAR 7X60X80 (Permanent Stent) ×2 IMPLANT
SYR MEDRAD MARK V 150ML (SYRINGE) ×2 IMPLANT
TUBING CONTRAST HIGH PRESS 72 (TUBING) ×2 IMPLANT
WIRE J 3MM .035X145CM (WIRE) ×2 IMPLANT
WIRE MAGIC TORQUE 260C (WIRE) ×4 IMPLANT

## 2016-02-02 NOTE — Op Note (Signed)
[facility] Percutaneous Study/Intervention Procedural Note   Date of Surgery: 02/02/2016  Surgeon(s):DEW,JASON   Assistants:none  Pre-operative Diagnosis: PAD with claudication bilateral lower extremities, abdominal aortic aneurysm  Post-operative diagnosis: Same with bilateral common iliac artery stenosis, a moderate sized mid infrarenal abdominal aortic aneurysm with a napkin ring like stenosis in the aorta below this and above and ectatic distal aorta, no significant infrainguinal stenosis bilaterally  Procedure(s) Performed: 1. Ultrasound guidance for vascular access bilateral femoral arteries 2. Catheter placement into aorta from bilateral femoral approaches 3. Aortogram and selective bilateral lower extremity angiogram 4. Percutaneous transluminal angioplasty of the aorta 5. Percutaneous transluminal angioplasty of the common iliac arteries with drug-coated angioplasty balloons in a kissing balloon fashion  6.  Self-expanding stent placement to the left common and external iliac artery with 7 mm diameter by 6 cm length stent for dissection and greater than 50% residual stenosis after angioplasty at the iliac bifurcation 7. StarClose closure device bilateral femoral arteries  EBL: 50 cc   Contrast: 120 cc  Fluoro Time: 9.1 minutes  Moderate Conscious Sedation Time: approximately 70 minutes using 10 mg of Versed and 225 mcg of Fentanyl  Indications: Patient is a 75 y.o.female with very short distance claudication and a small to medium size AAA. The patient has noninvasive study showing about a 3.4 cm infrarenal abdominal aortic aneurysm with severe aortic atherosclerosis and suggestion of iliac artery disease bilaterally. The patient is brought in for angiography for further evaluation and potential treatment. Risks and benefits are discussed and informed consent  is obtained  Procedure: The patient was identified and appropriate procedural time out was performed. The patient was then placed supine on the table and prepped and draped in the usual sterile fashion.Moderate conscious sedation was administered during a face to face encounter with the patient throughout the procedure with my supervision of the RN administering medicines and monitoring the patient's vital signs, pulse oximetry, telemetry and mental status throughout from the start of the procedure until the patient was taken to the recovery room. Ultrasound was used to evaluate the left common femoral artery. It was small but patent . A digital ultrasound image was acquired. A Seldinger needle was used to access the left common femoral artery under direct ultrasound guidance and a permanent image was performed. A 0.035 J wire was advanced without resistance and a 5Fr sheath was placed. The J-wire would not track into the aorta and imaging was performed through the left femoral sheath showing a high-grade stenosis in the proximal left common iliac artery extending down to the iliac bifurcation with a lesser degree of stenosis. I was able to navigate into the aorta with a Glidewire and a Kumpe catheter and imaging performed through the Kumpe catheter showed a napkin ring like stenosis in the aorta between and ectatic distal aorta and the aneurysmal mid infrarenal aorta. This stenosis was greater than 85%. I was then able to navigate through this with a Glidewire and a Kumpe catheter and advanced into the proximal abdominal aorta. Pigtail catheter was placed into the aorta and an AP aortogram was performed confirming the above findings and also including a 70-80% stenosis of the proximal right common iliac artery. The left renal artery appeared widely patent. There appeared to be a small disease right renal artery with no good right nephrogram on our aortogram. Selective left lower extremity angiogram was  then performed through the sheath. This demonstrated no significant in for inguinal stenosis on the left with  two-vessel runoff distally. I then used the ultrasound to access the right common femoral artery under direct ultrasound guidance with a Seldinger needle and a permanent image was recorded. A 6 French sheath was placed in the right femoral artery and using the Kumpe catheter and Glidewire I was able to navigate through the right common iliac artery stenosis and aortic stenosis and exchanged for a Magic torque wire in the aorta. I image the right leg through the right femoral sheath which showed mild stenosis in the mid to distal SFA of less than 30% and no stenosis in the popliteal artery with two-vessel runoff distally. The patient was systemically heparinized and a 6 French sheath was then placed on the left as well. I then placed a Magic torque wire's bilaterally having crossed the iliac artery stenosis in the aortic stenosis and proceeded with treatment. This was a somewhat difficult problem as the aortic aneurysm in the mid aorta and ectasia distally really precluded any safe stent placement in this aortic lesion. I started in the common iliac arteries by using a 5 mm diameter by 4 cm length and a 5 mm diameter by 6 cm length Lutonix drug-coated angioplasty balloon in a kissing balloon fashion in the common iliac arteries extending to the iliac bifurcation on the left. These were inflated to 12 atm but were slightly undersized on the left and more undersized on the right. The inflation was held for 1 minute. I then advanced these balloons into the mid to distal aorta to treat the napkin ringlike stenosis by using both 5 mm diameter balloons simultaneously to create the affect of a 10 mm balloon without having to upsize our sheaths. These inflations were taken to 12 atm as well. There was still significant stenosis located in all 3 locations so I upsized my balloon to a 7 mm diameter by 6 cm length  balloon on the right and a 6 mm diameter by 8 cm length balloon on the left. These were then inflated in the iliac arteries bilaterally with the left balloon extending about 2 cm into the external iliac artery as well to address the disease tracked more distally on the left. Inflations were taken to 10 atm on the right and 8 atm on the left and held for 1 minute. The balloons were then deflated and then advanced into the aorta to treat the napkin ringlike aortic stenosis separately without having to upsize our sheaths essentially creating a 13 mm balloon. This inflation was taken to 10 atm on both balloons. Completion angiogram still showed about a 50-60% residual stenosis in the aortic lesion, but this again was not an ideal location for stent placement so I elected to leave this in place. The right common iliac artery had only about a 20-25% residual stenosis. The proximal portion of the left common iliac artery had only about a 20% stenosis just there was now a dissection and stenosis at the iliac bifurcation that was high-grade residual. I elected to cover the distal left common iliac artery and proximal external iliac artery with a 7 mm diameter by 6 cm length self-expanding stent postdilated with a 6 mm balloon with excellent angiographic completion result and less than 20% residual stenosis. I elected to terminate the procedure. The sheath was removed and StarClose closure device was deployed in the right femoral artery with excellent hemostatic result. StarClose was performed in the left femoral artery with partial hemostatic result and pressure was held for 15 minutes to gain hemostasis which  was complete. The patient was taken to the recovery room in stable condition having tolerated the procedure well.  Findings:  Aortogram: Sluggish flow and a diseased small diseased right renal artery with a widely patent left renal artery. The mid infrarenal aorta had a 3.4-3.5 cm aneurysm with a  napkin ring like stenosis just below the aneurysm of greater than 85% and then an ectatic distal aorta below the stenosis. The right common iliac artery stenosis was about 70-80%. High-grade stenosis of the left common iliac artery of greater than 85% Bilateral Lower Extremity: No significant infrainguinal stenosis of greater than 50% in either lower extremity with two-vessel runoff distally   Disposition: Patient was taken to the recovery room in stable condition having tolerated the procedure well.  Complications: None  DEW,JASON 02/02/2016 11:16 AM

## 2016-02-02 NOTE — H&P (Signed)
  [facility] History & Physical Update  The patient was interviewed and re-examined.  The patient's previous History and Physical has been reviewed and is unchanged.  There is no change in the plan of care. We plan to proceed with the scheduled procedure.  Tajuan Dufault, MD  02/02/2016, 8:11 AM

## 2016-02-02 NOTE — Progress Notes (Signed)
1410- 20cc of air deflated from right and left PAD

## 2016-02-02 NOTE — Progress Notes (Signed)
Small to medium left groin hematoma Not enlarging, but PADs in place Will keep PADs on and watch overnight, likely discharge home tomorrow

## 2016-02-02 NOTE — OR Nursing (Signed)
10:20 IV infiltrated and restarted in lt wrist with @ 22 angio-cath., Heparin 3000 u and Fentanyl 50 mcg and versed 2 mg given via sheath by Dr. Wyn Quakerew until IV access was obtained.

## 2016-02-02 NOTE — Progress Notes (Signed)
Patient admitted to unit from special, patient alert and oriented, PRN pain med administer once, family at bedside dry dressing intact to bil groin, no bleeding noted.

## 2016-02-03 ENCOUNTER — Encounter: Payer: Self-pay | Admitting: Vascular Surgery

## 2016-02-03 DIAGNOSIS — Z7902 Long term (current) use of antithrombotics/antiplatelets: Secondary | ICD-10-CM | POA: Diagnosis not present

## 2016-02-03 DIAGNOSIS — I6523 Occlusion and stenosis of bilateral carotid arteries: Secondary | ICD-10-CM | POA: Diagnosis not present

## 2016-02-03 DIAGNOSIS — Z79899 Other long term (current) drug therapy: Secondary | ICD-10-CM | POA: Diagnosis not present

## 2016-02-03 DIAGNOSIS — I771 Stricture of artery: Secondary | ICD-10-CM | POA: Diagnosis not present

## 2016-02-03 DIAGNOSIS — I10 Essential (primary) hypertension: Secondary | ICD-10-CM | POA: Diagnosis not present

## 2016-02-03 DIAGNOSIS — Z96649 Presence of unspecified artificial hip joint: Secondary | ICD-10-CM | POA: Diagnosis not present

## 2016-02-03 DIAGNOSIS — R0989 Other specified symptoms and signs involving the circulatory and respiratory systems: Secondary | ICD-10-CM | POA: Diagnosis not present

## 2016-02-03 DIAGNOSIS — Z87891 Personal history of nicotine dependence: Secondary | ICD-10-CM | POA: Diagnosis not present

## 2016-02-03 DIAGNOSIS — I70213 Atherosclerosis of native arteries of extremities with intermittent claudication, bilateral legs: Secondary | ICD-10-CM | POA: Diagnosis not present

## 2016-02-03 DIAGNOSIS — Z7982 Long term (current) use of aspirin: Secondary | ICD-10-CM | POA: Diagnosis not present

## 2016-02-03 DIAGNOSIS — I714 Abdominal aortic aneurysm, without rupture: Secondary | ICD-10-CM | POA: Diagnosis not present

## 2016-02-03 DIAGNOSIS — E785 Hyperlipidemia, unspecified: Secondary | ICD-10-CM | POA: Diagnosis not present

## 2016-02-03 LAB — BASIC METABOLIC PANEL
Anion gap: 7 (ref 5–15)
BUN: 15 mg/dL (ref 6–20)
CALCIUM: 8.5 mg/dL — AB (ref 8.9–10.3)
CHLORIDE: 109 mmol/L (ref 101–111)
CO2: 25 mmol/L (ref 22–32)
CREATININE: 0.74 mg/dL (ref 0.44–1.00)
GFR calc Af Amer: >60 mL/min (ref >60)
GFR calc non Af Amer: >60 mL/min (ref >60)
GLUCOSE: 119 mg/dL — AB (ref 65–99)
Potassium: 3.4 mmol/L — ABNORMAL LOW (ref 3.5–5.1)
Sodium: 141 mmol/L (ref 135–145)

## 2016-02-03 LAB — CBC
HCT: 36.9 % (ref 35.0–47.0)
Hemoglobin: 12.6 g/dL (ref 12.0–16.0)
MCH: 33.5 pg (ref 26.0–34.0)
MCHC: 34.2 g/dL (ref 32.0–36.0)
MCV: 97.9 fL (ref 80.0–100.0)
PLATELETS: 209 10*3/uL (ref 150–440)
RBC: 3.77 MIL/uL — AB (ref 3.80–5.20)
RDW: 12.9 % (ref 11.5–14.5)
WBC: 6.1 10*3/uL (ref 3.6–11.0)

## 2016-02-03 MED ORDER — POTASSIUM CHLORIDE CRYS ER 20 MEQ PO TBCR
40.0000 meq | EXTENDED_RELEASE_TABLET | Freq: Once | ORAL | Status: AC
  Administered 2016-02-03: 40 meq via ORAL
  Filled 2016-02-03: qty 2

## 2016-02-03 MED ORDER — ASPIRIN 81 MG PO TBEC: 81.0000 mg | DELAYED_RELEASE_TABLET | Freq: Every day | ORAL | Status: DC

## 2016-02-03 NOTE — Discharge Instructions (Signed)
You may shower as of Sunday. No driving while on pain medications.

## 2016-02-03 NOTE — Progress Notes (Signed)
Foley cath removed per policy. Pt tolerated removal. I will continue to assess. 500 ml urine emptied prior to removal.

## 2016-02-03 NOTE — Care Management Obs Status (Signed)
MEDICARE OBSERVATION STATUS NOTIFICATION   Patient Details  Name: Elizabeth [name] MRN: [id number] Date of Birth: May 08, 1941   Medicare Observation Status Notification Given:  Yes    Marily MemosLisa M Oksana Deberry, RN 02/03/2016, 9:48 AM

## 2016-02-03 NOTE — Progress Notes (Signed)
[facility]  Daily Progress Note   Subjective: 1 Day Post-Op: Ultrasound guidance for vascular access bilateral femoral arteries, Catheter placement into aorta from bilateral femoral approaches, Aortogram and selective bilateral lower extremity angiogram, Percutaneous transluminal angioplasty of the aorta, Percutaneous transluminal angioplasty of the common iliac arteries with drug-coated angioplasty balloons in a kissing balloon fashion, Self-expanding stent placement to the left common and external iliac artery with 7 mm diameter by 6 cm length stent for dissection and greater than 50% residual stenosis after angioplasty at the iliac bifurcation with StarClose closure device bilateral femoral arteries  Patient admitted overnight for left groin hematoma. Stable throughout night of procedure. No complaints this AM. CBC stable. Minimal swelling with ecchymosis to the site. Patient still with foley in. Asking for d/c home.   Objective: Filed Vitals:   02/02/16 1518 02/02/16 1952 02/03/16 0501 02/03/16 1109  BP: 142/60 125/51 123/59 123/68  Pulse: 85 76 82 79  Temp: 98.6 F (37 C) 98.2 F (36.8 C) 99.7 F (37.6 C) 99.5 F (37.5 C)  TempSrc: Oral Oral Oral Oral  Resp: 16 18 18 19   Height: 5\' 2"  (1.575 m)     Weight: 63.594 kg (140 lb 3.2 oz)  58.922 kg (129 lb 14.4 oz)   SpO2: 97% 97% 96% 94%    Intake/Output Summary (Last 24 hours) at 02/03/16 1223 Last data filed at 02/03/16 0900  Gross per 24 hour  Intake   1460 ml  Output   1500 ml  Net    -40 ml   Physical Exam: A&Ox3, NAD CV: RRR Pulmonary: CTA Bilaterally Abdomen: Soft, Nontender, Nondistended Foley: intact, draining clear urine Groin: Left - Dressing clean, dry and intact, minimal swelling, some ecchymosis noted extending toward groin, skin intact.  Right: Dressing clean , dry and intact. No swelling or drainage noted.  Vascular:  Left Lower Extremity: Warm, non-tender, minimal edema  Right Lower  Extremity: Warm, non-tender, minimal edema  Laboratory: CBC    Component Value Date/Time   WBC 6.1 02/03/2016 0416   WBC 9.9 10/02/2012 0608   HGB 12.6 02/03/2016 0416   HGB 15.3 10/02/2012 0608   HCT 36.9 02/03/2016 0416   HCT 44.6 10/02/2012 0608   PLT 209 02/03/2016 0416   PLT 262 10/02/2012 0608   BMET    Component Value Date/Time   NA 141 02/03/2016 0416   NA 137 10/02/2012 0608   K 3.4* 02/03/2016 0416   K 3.5 10/02/2012 0608   CL 109 02/03/2016 0416   CL 105 10/02/2012 0608   CO2 25 02/03/2016 0416   CO2 24 10/02/2012 0608   GLUCOSE 119* 02/03/2016 0416   GLUCOSE 124* 10/02/2012 0608   BUN 15 02/03/2016 0416   BUN 8 10/02/2012 0608   CREATININE 0.74 02/03/2016 0416   CREATININE 0.84 10/02/2012 0608   CALCIUM 8.5* 02/03/2016 0416   CALCIUM 8.7 10/02/2012 0608   GFRNONAA >60 02/03/2016 0416   GFRNONAA >60 09/08/2012 1457   GFRAA >60 02/03/2016 0416   GFRAA >60 09/08/2012 1457   Assessment/Planning: 75 year old female s/p Aortogram and selective bilateral lower extremity angiogram - stable 1) Stable hematoma overnight 2) Stable CBC this AM 3) D/C foley. 4) Tolerating a regular diet 5) Ambulating 6) Patient may be discharged home when foley is removed and she is voiding on her own.   Cleda DaubKimberly Sarely Stracener PA-C 02/03/2016 12:23 PM

## 2016-02-03 NOTE — Progress Notes (Addendum)
Notified Dr. Sheryle Hailiamond of potassium level of 3.4.

## 2016-02-03 NOTE — Progress Notes (Signed)
A&O. Up with one assist. PAD to bil groin left in place. No bleeding. Hematoma noted, with no changes through the night. Percocet given for pain. SLept well during the night.

## 2016-02-03 NOTE — Discharge Summary (Signed)
[facility]    Discharge Summary    Patient ID:  Elizabeth [name] MRN: [id number] DOB/AGE: [birth year] 75 y.o.  Admit date: 02/02/2016 Discharge date: 02/03/2016 Date of Surgery: 02/02/2016 Surgeon: Surgeon(s): Annice NeedyJason S Dew, MD  Admission Diagnosis: RT lower extremity angio   ASO w claudication  Discharge Diagnoses:  RT lower extremity angio   ASO w claudication  Secondary Diagnoses: Past Medical History  Diagnosis Date  . Peripheral vascular disease (HCC)   . Hypertension     Procedure(s): Lower Extremity Angiography  Discharged Condition: stable  HPI:  The patient is a 75 year old female with very short distance claudication and a small to medium size AAA. The patient has noninvasive study showing about a 3.4 cm infrarenal abdominal aortic aneurysm with severe aortic atherosclerosis and suggestion of iliac artery disease bilaterally. The patient is brought in for angiography for further evaluation and potential treatment.  On 02/03/16, the patient underwent a ultrasound guidance for vascular access bilateral femoral arteries, Catheter placement into aorta from bilateral femoral approaches, Aortogram and selective bilateral lower extremity angiogram, Percutaneous transluminal angioplasty of the aorta, Percutaneous transluminal angioplasty of the common iliac arteries with drug-coated angioplasty balloons in a kissing balloon fashion, Self-expanding stent placement to the left common and external iliac artery with 7 mm diameter by 6 cm length stent for dissection and greater than 50% residual stenosis after angioplasty at the iliac bifurcation with StarClose closure device bilateral femoral arteries.  The patient tolerated the procedure well and was transferred from the OR to the PACU without issue. Post-procedure the patient was noted to have a small left groin hematoma at the femoral access site. She was admitted overnight for observation.  POD#1. Patient  hematoma stable overnight and into POD #1. CBC stable. Tolerating a regular diet, ambulating without difficulty and voiding s/p foley removal.   Hospital Course:  Elizabeth [name] is a 75 y.o. female is S/P Bilateral  Procedure(s): Lower Extremity Angiography  Physical exam:  A&Ox3, NAD CV: RRR Pulmonary: CTA Bilaterally Abdomen: Soft, Nontender, Nondistended Foley: intact, draining clear urine Groin: Left - Dressing clean, dry and intact, minimal swelling, some ecchymosis noted extending toward groin, skin intact. Right: Dressing clean , dry and intact. No swelling or drainage noted.  Vascular: Left Lower Extremity: Warm, non-tender, minimal edema Right Lower Extremity: Warm, non-tender, minimal edema  Post-op wounds: hematoma, ecchymotic but healing well  Pt. Ambulating, voiding and taking PO diet without difficulty.  Pt pain controlled with PO pain meds.  Labs as below  Complications:none  Consults:     Significant Diagnostic Studies: CBC Lab Results  Component Value Date   WBC 6.1 02/03/2016   HGB 12.6 02/03/2016   HCT 36.9 02/03/2016   MCV 97.9 02/03/2016   PLT 209 02/03/2016    BMET    Component Value Date/Time   NA 141 02/03/2016 0416   NA 137 10/02/2012 0608   K 3.4* 02/03/2016 0416   K 3.5 10/02/2012 0608   CL 109 02/03/2016 0416   CL 105 10/02/2012 0608   CO2 25 02/03/2016 0416   CO2 24 10/02/2012 0608   GLUCOSE 119* 02/03/2016 0416   GLUCOSE 124* 10/02/2012 0608   BUN 15 02/03/2016 0416   BUN 8 10/02/2012 0608   CREATININE 0.74 02/03/2016 0416   CREATININE 0.84 10/02/2012 0608   CALCIUM 8.5* 02/03/2016 0416   CALCIUM 8.7 10/02/2012 0608   GFRNONAA >60 02/03/2016 0416   GFRNONAA >60 09/08/2012 1457   GFRAA >  60 02/03/2016 0416   GFRAA >60 09/08/2012 1457   COAG Lab Results  Component Value Date   INR 1.0 10/02/2012   INR 0.9 04/18/2012     Disposition:  Discharge to :Home    Medication List     TAKE these medications        amLODipine 10 MG tablet  Commonly known as:  NORVASC  Take 10 mg by mouth daily.     aspirin 81 MG EC tablet  Take 1 tablet (81 mg total) by mouth daily.     clopidogrel 75 MG tablet  Commonly known as:  PLAVIX  Take 75 mg by mouth daily.     GOODY HEADACHE PO  Take 1 packet by mouth daily as needed (headache).     HYDROcodone-acetaminophen 10-325 MG tablet  Commonly known as:  NORCO  Take 1 tablet by mouth every 6 (six) hours as needed.     metoprolol 50 MG tablet  Commonly known as:  LOPRESSOR  Take 50 mg by mouth 2 (two) times daily.     multivitamin tablet  Take 1 tablet by mouth daily.     potassium citrate 10 MEQ (1080 MG) SR tablet  Commonly known as:  UROCIT-K  Take 10 mEq by mouth 2 (two) times daily.     pravastatin 40 MG tablet  Commonly known as:  PRAVACHOL  Take 40 mg by mouth daily.     VITAMIN B-12 PO  Take 1 tablet by mouth daily.       Verbal and written Discharge instructions given to the patient. Wound care per Discharge AVS Follow-up Information    Follow up with DEW,JASON, MD In 4 weeks.   Specialties:  Vascular Surgery, Radiology, Interventional Cardiology   Why:  with ABIs   Contact information:   [address] [place] [address] [phone]       Signed: Tonette LedererKIMBERLY A Camay Pedigo, PA-C  02/03/2016, 12:42 PM

## 2016-02-03 NOTE — Care Management (Signed)
From Home alone. Independent, drives, uses a cane. No needs anticipated.

## 2016-02-09 ENCOUNTER — Ambulatory Visit: Admission: RE | Admit: 2016-02-09 | Payer: Medicare Other | Source: Ambulatory Visit | Admitting: Vascular Surgery

## 2016-02-09 ENCOUNTER — Encounter: Admission: RE | Payer: Self-pay | Source: Ambulatory Visit

## 2016-02-09 SURGERY — LOWER EXTREMITY ANGIOGRAPHY
Anesthesia: Moderate Sedation | Laterality: Left

## 2016-03-09 DIAGNOSIS — I70213 Atherosclerosis of native arteries of extremities with intermittent claudication, bilateral legs: Secondary | ICD-10-CM | POA: Diagnosis not present

## 2016-03-09 DIAGNOSIS — I714 Abdominal aortic aneurysm, without rupture: Secondary | ICD-10-CM | POA: Diagnosis not present

## 2016-03-09 DIAGNOSIS — I10 Essential (primary) hypertension: Secondary | ICD-10-CM | POA: Diagnosis not present

## 2016-04-02 DIAGNOSIS — I779 Disorder of arteries and arterioles, unspecified: Secondary | ICD-10-CM | POA: Diagnosis not present

## 2016-04-02 DIAGNOSIS — I10 Essential (primary) hypertension: Secondary | ICD-10-CM | POA: Diagnosis not present

## 2016-04-02 DIAGNOSIS — K219 Gastro-esophageal reflux disease without esophagitis: Secondary | ICD-10-CM | POA: Diagnosis not present

## 2016-04-02 DIAGNOSIS — E782 Mixed hyperlipidemia: Secondary | ICD-10-CM | POA: Diagnosis not present

## 2016-04-04 DIAGNOSIS — K219 Gastro-esophageal reflux disease without esophagitis: Secondary | ICD-10-CM | POA: Diagnosis not present

## 2016-04-04 DIAGNOSIS — E782 Mixed hyperlipidemia: Secondary | ICD-10-CM | POA: Diagnosis not present

## 2016-04-04 DIAGNOSIS — Z79891 Long term (current) use of opiate analgesic: Secondary | ICD-10-CM | POA: Diagnosis not present

## 2016-04-04 DIAGNOSIS — I10 Essential (primary) hypertension: Secondary | ICD-10-CM | POA: Diagnosis not present

## 2016-04-04 DIAGNOSIS — I779 Disorder of arteries and arterioles, unspecified: Secondary | ICD-10-CM | POA: Diagnosis not present

## 2016-06-12 ENCOUNTER — Other Ambulatory Visit (INDEPENDENT_AMBULATORY_CARE_PROVIDER_SITE_OTHER): Payer: Self-pay | Admitting: Vascular Surgery

## 2016-06-12 DIAGNOSIS — M79604 Pain in right leg: Secondary | ICD-10-CM

## 2016-06-12 DIAGNOSIS — I739 Peripheral vascular disease, unspecified: Secondary | ICD-10-CM

## 2016-06-12 DIAGNOSIS — I700 Atherosclerosis of aorta: Secondary | ICD-10-CM

## 2016-06-12 DIAGNOSIS — I708 Atherosclerosis of other arteries: Principal | ICD-10-CM

## 2016-06-12 DIAGNOSIS — I70219 Atherosclerosis of native arteries of extremities with intermittent claudication, unspecified extremity: Secondary | ICD-10-CM

## 2016-06-12 DIAGNOSIS — M79605 Pain in left leg: Principal | ICD-10-CM

## 2016-06-15 ENCOUNTER — Ambulatory Visit (INDEPENDENT_AMBULATORY_CARE_PROVIDER_SITE_OTHER): Payer: Medicare Other | Admitting: Vascular Surgery

## 2016-06-15 ENCOUNTER — Ambulatory Visit (INDEPENDENT_AMBULATORY_CARE_PROVIDER_SITE_OTHER): Payer: Medicare Other

## 2016-06-15 DIAGNOSIS — I700 Atherosclerosis of aorta: Secondary | ICD-10-CM

## 2016-06-15 DIAGNOSIS — I708 Atherosclerosis of other arteries: Secondary | ICD-10-CM

## 2016-06-15 DIAGNOSIS — M79605 Pain in left leg: Secondary | ICD-10-CM | POA: Diagnosis not present

## 2016-06-15 DIAGNOSIS — I70219 Atherosclerosis of native arteries of extremities with intermittent claudication, unspecified extremity: Secondary | ICD-10-CM | POA: Diagnosis not present

## 2016-06-15 DIAGNOSIS — M79604 Pain in right leg: Secondary | ICD-10-CM | POA: Diagnosis not present

## 2016-06-15 DIAGNOSIS — I739 Peripheral vascular disease, unspecified: Secondary | ICD-10-CM | POA: Diagnosis not present

## 2016-06-19 ENCOUNTER — Telehealth: Payer: Self-pay | Admitting: Vascular Surgery

## 2016-06-19 DIAGNOSIS — I70213 Atherosclerosis of native arteries of extremities with intermittent claudication, bilateral legs: Secondary | ICD-10-CM

## 2016-06-19 DIAGNOSIS — I714 Abdominal aortic aneurysm, without rupture, unspecified: Secondary | ICD-10-CM

## 2016-06-19 NOTE — Telephone Encounter (Signed)
Call patient to go over her results. ABIs 0.95 bilaterally with good flow. She is symptomatically improved. Aortoiliac duplex shows 3.4 cm distal aortic aneurysm which was known. Iliac artery stents with some mild stenosis on the left but nothing high-grade. Plan to recheck in 6 months.

## 2016-10-03 DIAGNOSIS — T8484XD Pain due to internal orthopedic prosthetic devices, implants and grafts, subsequent encounter: Secondary | ICD-10-CM | POA: Diagnosis not present

## 2016-10-03 DIAGNOSIS — I779 Disorder of arteries and arterioles, unspecified: Secondary | ICD-10-CM | POA: Diagnosis not present

## 2016-10-03 DIAGNOSIS — E785 Hyperlipidemia, unspecified: Secondary | ICD-10-CM | POA: Diagnosis not present

## 2016-10-03 DIAGNOSIS — I10 Essential (primary) hypertension: Secondary | ICD-10-CM | POA: Diagnosis not present

## 2016-11-08 DIAGNOSIS — Z0000 Encounter for general adult medical examination without abnormal findings: Secondary | ICD-10-CM | POA: Diagnosis not present

## 2017-02-13 ENCOUNTER — Ambulatory Visit (INDEPENDENT_AMBULATORY_CARE_PROVIDER_SITE_OTHER): Payer: Medicare Other

## 2017-02-13 DIAGNOSIS — I70213 Atherosclerosis of native arteries of extremities with intermittent claudication, bilateral legs: Secondary | ICD-10-CM

## 2017-02-13 DIAGNOSIS — I714 Abdominal aortic aneurysm, without rupture, unspecified: Secondary | ICD-10-CM

## 2017-02-25 ENCOUNTER — Encounter (INDEPENDENT_AMBULATORY_CARE_PROVIDER_SITE_OTHER): Payer: Self-pay | Admitting: Vascular Surgery

## 2017-03-11 DIAGNOSIS — K219 Gastro-esophageal reflux disease without esophagitis: Secondary | ICD-10-CM | POA: Diagnosis not present

## 2017-03-11 DIAGNOSIS — T8484XD Pain due to internal orthopedic prosthetic devices, implants and grafts, subsequent encounter: Secondary | ICD-10-CM | POA: Diagnosis not present

## 2017-03-11 DIAGNOSIS — I10 Essential (primary) hypertension: Secondary | ICD-10-CM | POA: Diagnosis not present

## 2017-03-11 DIAGNOSIS — E785 Hyperlipidemia, unspecified: Secondary | ICD-10-CM | POA: Diagnosis not present

## 2017-03-12 ENCOUNTER — Other Ambulatory Visit (HOSPITAL_COMMUNITY): Payer: Self-pay | Admitting: Pulmonary Disease

## 2017-03-12 DIAGNOSIS — Z1231 Encounter for screening mammogram for malignant neoplasm of breast: Secondary | ICD-10-CM

## 2017-03-21 DIAGNOSIS — T438X4D Poisoning by other psychotropic drugs, undetermined, subsequent encounter: Secondary | ICD-10-CM | POA: Diagnosis not present

## 2017-03-21 DIAGNOSIS — I10 Essential (primary) hypertension: Secondary | ICD-10-CM | POA: Diagnosis not present

## 2017-03-21 DIAGNOSIS — E785 Hyperlipidemia, unspecified: Secondary | ICD-10-CM | POA: Diagnosis not present

## 2017-03-21 DIAGNOSIS — K219 Gastro-esophageal reflux disease without esophagitis: Secondary | ICD-10-CM | POA: Diagnosis not present

## 2017-05-20 ENCOUNTER — Ambulatory Visit (HOSPITAL_COMMUNITY): Payer: Medicare Other

## 2017-06-19 DIAGNOSIS — Z79891 Long term (current) use of opiate analgesic: Secondary | ICD-10-CM | POA: Diagnosis not present

## 2017-08-13 DIAGNOSIS — M545 Low back pain: Secondary | ICD-10-CM | POA: Diagnosis not present

## 2017-08-13 DIAGNOSIS — E785 Hyperlipidemia, unspecified: Secondary | ICD-10-CM | POA: Diagnosis not present

## 2017-08-13 DIAGNOSIS — I10 Essential (primary) hypertension: Secondary | ICD-10-CM | POA: Diagnosis not present

## 2017-08-13 DIAGNOSIS — I739 Peripheral vascular disease, unspecified: Secondary | ICD-10-CM | POA: Diagnosis not present

## 2017-10-31 DIAGNOSIS — I10 Essential (primary) hypertension: Secondary | ICD-10-CM | POA: Diagnosis not present

## 2017-10-31 DIAGNOSIS — M545 Low back pain: Secondary | ICD-10-CM | POA: Diagnosis not present

## 2017-10-31 DIAGNOSIS — I739 Peripheral vascular disease, unspecified: Secondary | ICD-10-CM | POA: Diagnosis not present

## 2017-10-31 DIAGNOSIS — E785 Hyperlipidemia, unspecified: Secondary | ICD-10-CM | POA: Diagnosis not present

## 2017-11-11 DIAGNOSIS — I10 Essential (primary) hypertension: Secondary | ICD-10-CM | POA: Diagnosis not present

## 2017-11-11 DIAGNOSIS — T8484XD Pain due to internal orthopedic prosthetic devices, implants and grafts, subsequent encounter: Secondary | ICD-10-CM | POA: Diagnosis not present

## 2017-11-11 DIAGNOSIS — Z79891 Long term (current) use of opiate analgesic: Secondary | ICD-10-CM | POA: Diagnosis not present

## 2017-11-11 DIAGNOSIS — I739 Peripheral vascular disease, unspecified: Secondary | ICD-10-CM | POA: Diagnosis not present

## 2017-11-11 DIAGNOSIS — E785 Hyperlipidemia, unspecified: Secondary | ICD-10-CM | POA: Diagnosis not present

## 2018-02-10 DIAGNOSIS — I739 Peripheral vascular disease, unspecified: Secondary | ICD-10-CM | POA: Diagnosis not present

## 2018-02-10 DIAGNOSIS — T8484XD Pain due to internal orthopedic prosthetic devices, implants and grafts, subsequent encounter: Secondary | ICD-10-CM | POA: Diagnosis not present

## 2018-02-10 DIAGNOSIS — I10 Essential (primary) hypertension: Secondary | ICD-10-CM | POA: Diagnosis not present

## 2018-02-10 DIAGNOSIS — E785 Hyperlipidemia, unspecified: Secondary | ICD-10-CM | POA: Diagnosis not present

## 2018-02-13 ENCOUNTER — Other Ambulatory Visit (INDEPENDENT_AMBULATORY_CARE_PROVIDER_SITE_OTHER): Payer: Self-pay | Admitting: Vascular Surgery

## 2018-02-13 DIAGNOSIS — I714 Abdominal aortic aneurysm, without rupture, unspecified: Secondary | ICD-10-CM

## 2018-02-13 DIAGNOSIS — Z95820 Peripheral vascular angioplasty status with implants and grafts: Secondary | ICD-10-CM

## 2018-02-13 DIAGNOSIS — I739 Peripheral vascular disease, unspecified: Secondary | ICD-10-CM

## 2018-02-14 ENCOUNTER — Encounter (INDEPENDENT_AMBULATORY_CARE_PROVIDER_SITE_OTHER): Payer: Self-pay | Admitting: Vascular Surgery

## 2018-02-14 ENCOUNTER — Ambulatory Visit (INDEPENDENT_AMBULATORY_CARE_PROVIDER_SITE_OTHER): Payer: Medicare Other

## 2018-02-14 ENCOUNTER — Ambulatory Visit (INDEPENDENT_AMBULATORY_CARE_PROVIDER_SITE_OTHER): Payer: Medicare Other | Admitting: Vascular Surgery

## 2018-02-14 VITALS — BP 128/74 | HR 72 | Resp 17 | Ht 61.0 in | Wt 136.8 lb

## 2018-02-14 DIAGNOSIS — I714 Abdominal aortic aneurysm, without rupture, unspecified: Secondary | ICD-10-CM

## 2018-02-14 DIAGNOSIS — Z95820 Peripheral vascular angioplasty status with implants and grafts: Secondary | ICD-10-CM

## 2018-02-14 DIAGNOSIS — I739 Peripheral vascular disease, unspecified: Secondary | ICD-10-CM

## 2018-02-14 DIAGNOSIS — I10 Essential (primary) hypertension: Secondary | ICD-10-CM

## 2018-02-14 NOTE — Progress Notes (Signed)
MRN : [id number]  Elizabeth [name] is a 77 y.o. (Jul 05, 1941) female who presents with chief complaint of  Chief Complaint  Patient presents with  . Follow-up    75yr abi,aorta iliac  .  History of Present Illness: Patient returns today in follow up of abdominal aortic aneurysm and iliac artery occlusive disease.  She is undergone previous stent placement to the iliac arteries in the past.  No current lifestyle limiting claudication, ischemic rest pain, or ulceration.  Her ABIs today are stable and normal at 0.99 on the right and 1.03 on the left.  She has some elevated velocities in her left iliac stent although these are stable from previous studies.  Her abdominal aortic aneurysm is stable at 3.7 cm in maximal diameter.  Current Outpatient Medications  Medication Sig Dispense Refill  . amLODipine (NORVASC) 10 MG tablet Take 10 mg by mouth daily.  12  . aspirin EC 81 MG EC tablet Take 1 tablet (81 mg total) by mouth daily.    . Aspirin-Acetaminophen-Caffeine (GOODY HEADACHE PO) Take 1 packet by mouth daily as needed (headache).    . clopidogrel (PLAVIX) 75 MG tablet Take 75 mg by mouth daily.  12  . Cyanocobalamin (VITAMIN B-12 PO) Take 1 tablet by mouth daily.    [name] HYDROcodone-acetaminophen (NORCO) 10-325 MG tablet Take 1 tablet by mouth every 6 (six) hours as needed.    . metoprolol (LOPRESSOR) 50 MG tablet Take 50 mg by mouth 2 (two) times daily.  12  . Multiple Vitamin (MULTIVITAMIN) tablet Take 1 tablet by mouth daily.    . potassium citrate (UROCIT-K) 10 MEQ (1080 MG) SR tablet Take 10 mEq by mouth 2 (two) times daily.  12  . pravastatin (PRAVACHOL) 40 MG tablet Take 40 mg by mouth daily.  12  . rosuvastatin (CRESTOR) 20 MG tablet Take 20 mg by mouth daily.  12   No current facility-administered medications for this visit.     Past Medical History:  Diagnosis Date  . Hypertension   . Peripheral vascular disease [facility])     Past Surgical History:  Procedure Laterality Date    . PERIPHERAL VASCULAR CATHETERIZATION Right 02/02/2016   Procedure: Lower Extremity Angiography;  Surgeon: Annice Needy, MD;  Location: [facility] INVASIVE CV LAB;  Service: Cardiovascular;  Laterality: Right;    Social History Social History   Tobacco Use  . Smoking status: Former Smoker    Last attempt to quit: 02/02/1999    Years since quitting: 19.0  . Smokeless tobacco: Never Used  Substance Use Topics  . Alcohol use: No  . Drug use: No     Family History Family History  Problem Relation Age of Onset  . Hypertension Mother   . Stroke Mother   . Hypertension Father   . Stroke Father      Allergies  Allergen Reactions  . Codeine Other (See Comments)    Reaction: Unsure  . Sulfa Antibiotics Other (See Comments)    Mouth blisters     REVIEW OF SYSTEMS (Negative unless checked)  Constitutional: [] Weight loss  [] Fever  [] Chills Cardiac: [] Chest pain   [] Chest pressure   [] Palpitations   [] Shortness of breath when laying flat   [] Shortness of breath at rest   [] Shortness of breath with exertion. Vascular:  [] Pain in legs with walking   [] Pain in legs at rest   [] Pain in legs when laying flat   [x] Claudication   [] Pain in feet when walking  [] Pain  in feet at rest  [] Pain in feet when laying flat   [] History of DVT   [] Phlebitis   [] Swelling in legs   [] Varicose veins   [] Non-healing ulcers Pulmonary:   [] Uses home oxygen   [] Productive cough   [] Hemoptysis   [] Wheeze  [] COPD   [] Asthma Neurologic:  [] Dizziness  [] Blackouts   [] Seizures   [] History of stroke   [] History of TIA  [] Aphasia   [] Temporary blindness   [] Dysphagia   [] Weakness or numbness in arms   [] Weakness or numbness in legs Musculoskeletal:  [x] Arthritis   [] Joint swelling   [] Joint pain   [] Low back pain Hematologic:  [] Easy bruising  [] Easy bleeding   [] Hypercoagulable state   [] Anemic   Gastrointestinal:  [] Blood in stool   [] Vomiting blood  [x] Gastroesophageal reflux/heartburn   [] Abdominal pain Genitourinary:   [] Chronic kidney disease   [] Difficult urination  [] Frequent urination  [] Burning with urination   [] Hematuria Skin:  [] Rashes   [] Ulcers   [] Wounds Psychological:  [] History of anxiety   []  History of major depression.  Physical Examination  BP 128/74 (BP Location: Right Arm)   Pulse 72   Resp 17   Ht 5\' 1"  (1.549 m)   Wt 136 lb 12.8 oz (62.1 kg)   BMI 25.85 kg/m  Gen:  WD/WN, NAD. Younger than stated age Head: /AT, No temporalis wasting. Ear/Nose/Throat: Hearing grossly intact, nares w/o erythema or drainage Eyes: Conjunctiva clear. Sclera non-icteric Neck: Supple.  Trachea midline Pulmonary:  Good air movement, no use of accessory muscles.  Cardiac: RRR, no JVD Vascular:  Vessel Right Left  Radial Palpable Palpable                          PT Palpable Palpable  DP Palpable Palpable    Musculoskeletal: M/S 5/5 throughout.  No deformity or atrophy. No edema. Neurologic: Sensation grossly intact in extremities.  Symmetrical.  Speech is fluent.  Psychiatric: Judgment intact, Mood & affect appropriate for pt's clinical situation. Dermatologic: No rashes or ulcers noted.  No cellulitis or open wounds.       Labs No results found for this or any previous visit (from the past 2160 hour(s)).  Radiology No results found.  Assessment/Plan  Peripheral vascular disease (HCC) Her ABIs today are stable and normal at 0.99 on the right and 1.03 on the left.  She has some elevated velocities in her left iliac stent although these are stable from previous studies.  No limb threatening symptoms and her claudication at this point is very minimal.  Recheck in 1 year  Hypertension blood pressure control important in reducing the progression of atherosclerotic disease. On appropriate oral medications.   AAA (abdominal aortic aneurysm) (HCC)  Her abdominal aortic aneurysm is stable at 3.7 cm in maximal diameter.  Well below the threshold for repair.  Recheck in 1  year.    Festus BarrenJason Dew, MD  02/14/2018 12:31 PM    This note was created with Dragon medical transcription system.  Any errors from dictation are purely unintentional

## 2018-02-14 NOTE — Assessment & Plan Note (Signed)
Her abdominal aortic aneurysm is stable at 3.7 cm in maximal diameter.  Well below the threshold for repair.  Recheck in 1 year.

## 2018-02-14 NOTE — Assessment & Plan Note (Signed)
Her ABIs today are stable and normal at 0.99 on the right and 1.03 on the left.  She has some elevated velocities in her left iliac stent although these are stable from previous studies.  No limb threatening symptoms and her claudication at this point is very minimal.  Recheck in 1 year

## 2018-02-14 NOTE — Assessment & Plan Note (Signed)
blood pressure control important in reducing the progression of atherosclerotic disease. On appropriate oral medications.  

## 2018-04-14 DIAGNOSIS — Z0000 Encounter for general adult medical examination without abnormal findings: Secondary | ICD-10-CM | POA: Diagnosis not present

## 2018-04-16 DIAGNOSIS — I714 Abdominal aortic aneurysm, without rupture: Secondary | ICD-10-CM | POA: Diagnosis not present

## 2018-04-16 DIAGNOSIS — I779 Disorder of arteries and arterioles, unspecified: Secondary | ICD-10-CM | POA: Diagnosis not present

## 2018-04-16 DIAGNOSIS — N3281 Overactive bladder: Secondary | ICD-10-CM | POA: Diagnosis not present

## 2018-04-16 DIAGNOSIS — Z0000 Encounter for general adult medical examination without abnormal findings: Secondary | ICD-10-CM | POA: Diagnosis not present

## 2018-04-16 DIAGNOSIS — N200 Calculus of kidney: Secondary | ICD-10-CM | POA: Diagnosis not present

## 2018-04-16 DIAGNOSIS — K219 Gastro-esophageal reflux disease without esophagitis: Secondary | ICD-10-CM | POA: Diagnosis not present

## 2018-05-17 ENCOUNTER — Inpatient Hospital Stay (HOSPITAL_COMMUNITY)
Admission: EM | Admit: 2018-05-17 | Discharge: 2018-05-28 | DRG: 281 | Disposition: A | Discharge status: Home / Self Care | Payer: Medicare Other | Attending: Family Medicine | Admitting: Family Medicine

## 2018-05-17 ENCOUNTER — Emergency Department
Admission: EM | Admit: 2018-05-17 | Discharge: 2018-05-17 | Disposition: A | Discharge status: Other Acute Inpatient Hospital | Payer: Medicare Other | Attending: Emergency Medicine | Admitting: Emergency Medicine

## 2018-05-17 ENCOUNTER — Emergency Department: Payer: Medicare Other

## 2018-05-17 ENCOUNTER — Encounter: Payer: Self-pay | Admitting: Emergency Medicine

## 2018-05-17 ENCOUNTER — Encounter (HOSPITAL_COMMUNITY): Payer: Self-pay

## 2018-05-17 ENCOUNTER — Other Ambulatory Visit: Payer: Self-pay

## 2018-05-17 DIAGNOSIS — Z789 Other specified health status: Secondary | ICD-10-CM | POA: Diagnosis not present

## 2018-05-17 DIAGNOSIS — R0902 Hypoxemia: Secondary | ICD-10-CM | POA: Diagnosis not present

## 2018-05-17 DIAGNOSIS — D7589 Other specified diseases of blood and blood-forming organs: Secondary | ICD-10-CM | POA: Diagnosis not present

## 2018-05-17 DIAGNOSIS — I10 Essential (primary) hypertension: Secondary | ICD-10-CM | POA: Insufficient documentation

## 2018-05-17 DIAGNOSIS — R109 Unspecified abdominal pain: Secondary | ICD-10-CM | POA: Diagnosis not present

## 2018-05-17 DIAGNOSIS — I2584 Coronary atherosclerosis due to calcified coronary lesion: Secondary | ICD-10-CM | POA: Diagnosis present

## 2018-05-17 DIAGNOSIS — I6523 Occlusion and stenosis of bilateral carotid arteries: Secondary | ICD-10-CM | POA: Diagnosis not present

## 2018-05-17 DIAGNOSIS — Z7902 Long term (current) use of antithrombotics/antiplatelets: Secondary | ICD-10-CM

## 2018-05-17 DIAGNOSIS — Z7982 Long term (current) use of aspirin: Secondary | ICD-10-CM | POA: Insufficient documentation

## 2018-05-17 DIAGNOSIS — I714 Abdominal aortic aneurysm, without rupture: Secondary | ICD-10-CM | POA: Diagnosis present

## 2018-05-17 DIAGNOSIS — I719 Aortic aneurysm of unspecified site, without rupture: Secondary | ICD-10-CM

## 2018-05-17 DIAGNOSIS — I214 Non-ST elevation (NSTEMI) myocardial infarction: Secondary | ICD-10-CM | POA: Diagnosis not present

## 2018-05-17 DIAGNOSIS — R079 Chest pain, unspecified: Secondary | ICD-10-CM

## 2018-05-17 DIAGNOSIS — Z87891 Personal history of nicotine dependence: Secondary | ICD-10-CM | POA: Insufficient documentation

## 2018-05-17 DIAGNOSIS — I7100 Dissection of unspecified site of aorta: Secondary | ICD-10-CM | POA: Diagnosis not present

## 2018-05-17 DIAGNOSIS — I774 Celiac artery compression syndrome: Secondary | ICD-10-CM | POA: Diagnosis not present

## 2018-05-17 DIAGNOSIS — Z882 Allergy status to sulfonamides status: Secondary | ICD-10-CM

## 2018-05-17 DIAGNOSIS — Z823 Family history of stroke: Secondary | ICD-10-CM

## 2018-05-17 DIAGNOSIS — I7101 Dissection of thoracic aorta: Secondary | ICD-10-CM | POA: Diagnosis not present

## 2018-05-17 DIAGNOSIS — I7789 Other specified disorders of arteries and arterioles: Secondary | ICD-10-CM | POA: Insufficient documentation

## 2018-05-17 DIAGNOSIS — Z9049 Acquired absence of other specified parts of digestive tract: Secondary | ICD-10-CM

## 2018-05-17 DIAGNOSIS — I701 Atherosclerosis of renal artery: Secondary | ICD-10-CM | POA: Diagnosis not present

## 2018-05-17 DIAGNOSIS — R1031 Right lower quadrant pain: Secondary | ICD-10-CM | POA: Diagnosis present

## 2018-05-17 DIAGNOSIS — I25110 Atherosclerotic heart disease of native coronary artery with unstable angina pectoris: Secondary | ICD-10-CM | POA: Diagnosis not present

## 2018-05-17 DIAGNOSIS — I708 Atherosclerosis of other arteries: Secondary | ICD-10-CM | POA: Diagnosis present

## 2018-05-17 DIAGNOSIS — Z79899 Other long term (current) drug therapy: Secondary | ICD-10-CM

## 2018-05-17 DIAGNOSIS — Z01810 Encounter for preprocedural cardiovascular examination: Secondary | ICD-10-CM | POA: Diagnosis not present

## 2018-05-17 DIAGNOSIS — I471 Supraventricular tachycardia: Secondary | ICD-10-CM | POA: Diagnosis present

## 2018-05-17 DIAGNOSIS — N261 Atrophy of kidney (terminal): Secondary | ICD-10-CM | POA: Diagnosis present

## 2018-05-17 DIAGNOSIS — I25118 Atherosclerotic heart disease of native coronary artery with other forms of angina pectoris: Secondary | ICD-10-CM | POA: Diagnosis not present

## 2018-05-17 DIAGNOSIS — K4090 Unilateral inguinal hernia, without obstruction or gangrene, not specified as recurrent: Secondary | ICD-10-CM | POA: Diagnosis present

## 2018-05-17 DIAGNOSIS — M545 Low back pain: Secondary | ICD-10-CM | POA: Diagnosis not present

## 2018-05-17 DIAGNOSIS — I729 Aneurysm of unspecified site: Secondary | ICD-10-CM | POA: Diagnosis not present

## 2018-05-17 DIAGNOSIS — I6522 Occlusion and stenosis of left carotid artery: Secondary | ICD-10-CM | POA: Diagnosis not present

## 2018-05-17 DIAGNOSIS — R000 Tachycardia, unspecified: Secondary | ICD-10-CM | POA: Diagnosis not present

## 2018-05-17 DIAGNOSIS — Z95820 Peripheral vascular angioplasty status with implants and grafts: Secondary | ICD-10-CM

## 2018-05-17 DIAGNOSIS — I2510 Atherosclerotic heart disease of native coronary artery without angina pectoris: Secondary | ICD-10-CM | POA: Diagnosis not present

## 2018-05-17 DIAGNOSIS — I160 Hypertensive urgency: Secondary | ICD-10-CM | POA: Diagnosis present

## 2018-05-17 DIAGNOSIS — J449 Chronic obstructive pulmonary disease, unspecified: Secondary | ICD-10-CM | POA: Diagnosis present

## 2018-05-17 DIAGNOSIS — I252 Old myocardial infarction: Secondary | ICD-10-CM

## 2018-05-17 DIAGNOSIS — Z885 Allergy status to narcotic agent status: Secondary | ICD-10-CM

## 2018-05-17 DIAGNOSIS — J9811 Atelectasis: Secondary | ICD-10-CM | POA: Diagnosis not present

## 2018-05-17 DIAGNOSIS — J90 Pleural effusion, not elsewhere classified: Secondary | ICD-10-CM | POA: Diagnosis not present

## 2018-05-17 DIAGNOSIS — Z66 Do not resuscitate: Secondary | ICD-10-CM | POA: Diagnosis present

## 2018-05-17 DIAGNOSIS — Z8249 Family history of ischemic heart disease and other diseases of the circulatory system: Secondary | ICD-10-CM

## 2018-05-17 DIAGNOSIS — I2582 Chronic total occlusion of coronary artery: Secondary | ICD-10-CM | POA: Diagnosis present

## 2018-05-17 DIAGNOSIS — N200 Calculus of kidney: Secondary | ICD-10-CM | POA: Diagnosis present

## 2018-05-17 DIAGNOSIS — G8929 Other chronic pain: Secondary | ICD-10-CM | POA: Diagnosis present

## 2018-05-17 DIAGNOSIS — I7103 Dissection of thoracoabdominal aorta: Secondary | ICD-10-CM | POA: Diagnosis not present

## 2018-05-17 DIAGNOSIS — I493 Ventricular premature depolarization: Secondary | ICD-10-CM | POA: Diagnosis present

## 2018-05-17 DIAGNOSIS — I700 Atherosclerosis of aorta: Secondary | ICD-10-CM

## 2018-05-17 DIAGNOSIS — I739 Peripheral vascular disease, unspecified: Secondary | ICD-10-CM | POA: Diagnosis present

## 2018-05-17 DIAGNOSIS — M25552 Pain in left hip: Secondary | ICD-10-CM | POA: Diagnosis present

## 2018-05-17 DIAGNOSIS — I5030 Unspecified diastolic (congestive) heart failure: Secondary | ICD-10-CM | POA: Diagnosis not present

## 2018-05-17 DIAGNOSIS — I350 Nonrheumatic aortic (valve) stenosis: Secondary | ICD-10-CM | POA: Diagnosis not present

## 2018-05-17 DIAGNOSIS — I712 Thoracic aortic aneurysm, without rupture: Secondary | ICD-10-CM | POA: Diagnosis not present

## 2018-05-17 DIAGNOSIS — I771 Stricture of artery: Secondary | ICD-10-CM | POA: Diagnosis not present

## 2018-05-17 DIAGNOSIS — M546 Pain in thoracic spine: Secondary | ICD-10-CM

## 2018-05-17 DIAGNOSIS — Z7189 Other specified counseling: Secondary | ICD-10-CM | POA: Diagnosis not present

## 2018-05-17 HISTORY — DX: Abdominal aortic aneurysm, without rupture: I71.4

## 2018-05-17 HISTORY — DX: Abdominal aortic aneurysm, without rupture, unspecified: I71.40

## 2018-05-17 LAB — BASIC METABOLIC PANEL
ANION GAP: 10 (ref 5–15)
BUN: 12 mg/dL (ref 8–23)
CHLORIDE: 101 mmol/L (ref 98–111)
CO2: 27 mmol/L (ref 22–32)
Calcium: 9.3 mg/dL (ref 8.9–10.3)
Creatinine, Ser: 0.97 mg/dL (ref 0.44–1.00)
GFR calc non Af Amer: 56 mL/min — ABNORMAL LOW (ref >60)
Glucose, Bld: 125 mg/dL — ABNORMAL HIGH (ref 70–99)
POTASSIUM: 4 mmol/L (ref 3.5–5.1)
Sodium: 138 mmol/L (ref 135–145)

## 2018-05-17 LAB — URINALYSIS, COMPLETE (UACMP) WITH MICROSCOPIC
BACTERIA UA: NONE SEEN
BILIRUBIN URINE: NEGATIVE
Glucose, UA: NEGATIVE mg/dL
HGB URINE DIPSTICK: NEGATIVE
Ketones, ur: NEGATIVE mg/dL
LEUKOCYTES UA: NEGATIVE
NITRITE: NEGATIVE
PH: 7 (ref 5.0–8.0)
Protein, ur: 30 mg/dL — AB
SPECIFIC GRAVITY, URINE: 1.017 (ref 1.005–1.030)

## 2018-05-17 LAB — TROPONIN I
Troponin I: <0.03 ng/mL (ref <0.03)
Troponin I: 0.03 ng/mL (ref <0.03)

## 2018-05-17 LAB — CBC
HEMATOCRIT: 46.2 % — AB (ref 36.0–46.0)
HEMATOCRIT: 48.1 % — AB (ref 36.0–46.0)
HEMOGLOBIN: 15.4 g/dL — AB (ref 12.0–15.0)
HEMOGLOBIN: 15 g/dL (ref 12.0–15.0)
MCH: 33.9 pg (ref 26.0–34.0)
MCH: 32.3 pg (ref 26.0–34.0)
MCHC: 33.3 g/dL (ref 30.0–36.0)
MCHC: 31.2 g/dL (ref 30.0–36.0)
MCV: 101.8 fL — ABNORMAL HIGH (ref 80.0–100.0)
MCV: 103.7 fL — ABNORMAL HIGH (ref 80.0–100.0)
NRBC: 0 % (ref 0.0–0.2)
NRBC: 0 % (ref 0.0–0.2)
Platelets: 239 10*3/uL (ref 150–400)
Platelets: 245 10*3/uL (ref 150–400)
RBC: 4.54 MIL/uL (ref 3.87–5.11)
RBC: 4.64 MIL/uL (ref 3.87–5.11)
RDW: 12.2 % (ref 11.5–15.5)
RDW: 12.3 % (ref 11.5–15.5)
WBC: 11.2 10*3/uL — AB (ref 4.0–10.5)
WBC: 10.1 10*3/uL (ref 4.0–10.5)

## 2018-05-17 LAB — COMPREHENSIVE METABOLIC PANEL
ALT: 16 U/L (ref 0–44)
AST: 40 U/L (ref 15–41)
Albumin: 4.6 g/dL (ref 3.5–5.0)
Alkaline Phosphatase: 86 U/L (ref 38–126)
Anion gap: 10 (ref 5–15)
BUN: 16 mg/dL (ref 8–23)
CHLORIDE: 103 mmol/L (ref 98–111)
CO2: 28 mmol/L (ref 22–32)
CREATININE: 0.96 mg/dL (ref 0.44–1.00)
Calcium: 9.6 mg/dL (ref 8.9–10.3)
GFR, EST NON AFRICAN AMERICAN: 56 mL/min — AB (ref >60)
Glucose, Bld: 125 mg/dL — ABNORMAL HIGH (ref 70–99)
POTASSIUM: 5.6 mmol/L — AB (ref 3.5–5.1)
Sodium: 141 mmol/L (ref 135–145)
Total Bilirubin: 1.1 mg/dL (ref 0.3–1.2)
Total Protein: 8 g/dL (ref 6.5–8.1)

## 2018-05-17 LAB — I-STAT CG4 LACTIC ACID, ED: LACTIC ACID, VENOUS: 3.48 mmol/L — AB (ref 0.5–1.9)

## 2018-05-17 LAB — LIPASE, BLOOD
LIPASE: 41 U/L (ref 11–51)
LIPASE: 29 U/L (ref 11–51)

## 2018-05-17 LAB — MRSA PCR SCREENING: MRSA by PCR: NEGATIVE

## 2018-05-17 LAB — PROTIME-INR
INR: 0.98
PROTHROMBIN TIME: 12.9 s (ref 11.4–15.2)

## 2018-05-17 LAB — I-STAT TROPONIN, ED: Troponin i, poc: 0 ng/mL (ref 0.00–0.08)

## 2018-05-17 LAB — AMYLASE: Amylase: 132 U/L — ABNORMAL HIGH (ref 28–100)

## 2018-05-17 MED ORDER — FENTANYL CITRATE (PF) 100 MCG/2ML IJ SOLN
50.0000 ug | INTRAMUSCULAR | Status: DC | PRN
  Administered 2018-05-17 – 2018-05-18 (×5): 50 ug via INTRAVENOUS
  Filled 2018-05-17 (×5): qty 2

## 2018-05-17 MED ORDER — HYDROMORPHONE HCL 1 MG/ML IJ SOLN
1.0000 mg | Freq: Once | INTRAMUSCULAR | Status: AC
  Administered 2018-05-17: 1 mg via INTRAVENOUS
  Filled 2018-05-17: qty 1

## 2018-05-17 MED ORDER — AMLODIPINE BESYLATE 10 MG PO TABS
10.0000 mg | ORAL_TABLET | Freq: Every day | ORAL | Status: DC
  Administered 2018-05-17 – 2018-05-28 (×12): 10 mg via ORAL
  Filled 2018-05-17 (×8): qty 1
  Filled 2018-05-17 (×3): qty 2
  Filled 2018-05-17: qty 1

## 2018-05-17 MED ORDER — HYDROMORPHONE HCL 1 MG/ML IJ SOLN
INTRAMUSCULAR | Status: AC
  Filled 2018-05-17: qty 1

## 2018-05-17 MED ORDER — MORPHINE SULFATE (PF) 4 MG/ML IV SOLN
4.0000 mg | Freq: Once | INTRAVENOUS | Status: AC
  Administered 2018-05-17: 4 mg via INTRAVENOUS
  Filled 2018-05-17: qty 1

## 2018-05-17 MED ORDER — METOPROLOL TARTRATE 5 MG/5ML IV SOLN
5.0000 mg | Freq: Four times a day (QID) | INTRAVENOUS | Status: DC
  Administered 2018-05-17 – 2018-05-18 (×3): 5 mg via INTRAVENOUS
  Filled 2018-05-17 (×2): qty 5

## 2018-05-17 MED ORDER — HYDROCODONE-ACETAMINOPHEN 5-325 MG PO TABS
1.0000 | ORAL_TABLET | Freq: Four times a day (QID) | ORAL | Status: DC | PRN
  Administered 2018-05-17 – 2018-05-18 (×2): 1 via ORAL
  Filled 2018-05-17 (×2): qty 1

## 2018-05-17 MED ORDER — ROSUVASTATIN CALCIUM 20 MG PO TABS
20.0000 mg | ORAL_TABLET | Freq: Every day | ORAL | Status: DC
  Administered 2018-05-17 – 2018-05-27 (×10): 20 mg via ORAL
  Filled 2018-05-17 (×9): qty 1

## 2018-05-17 MED ORDER — HYDROMORPHONE HCL 1 MG/ML IJ SOLN
1.0000 mg | Freq: Once | INTRAMUSCULAR | Status: AC
  Administered 2018-05-17: 1 mg via INTRAVENOUS

## 2018-05-17 MED ORDER — CLEVIDIPINE BUTYRATE 0.5 MG/ML IV EMUL
0.0000 mg/h | INTRAVENOUS | Status: DC
  Administered 2018-05-17: 1 mg/h via INTRAVENOUS
  Administered 2018-05-17: 6 mg/h via INTRAVENOUS
  Administered 2018-05-18: 1 mg/h via INTRAVENOUS
  Administered 2018-05-18: 6 mg/h via INTRAVENOUS
  Administered 2018-05-18: 3 mg/h via INTRAVENOUS
  Filled 2018-05-17 (×7): qty 50

## 2018-05-17 MED ORDER — LACTATED RINGERS IV SOLN
INTRAVENOUS | Status: DC
  Administered 2018-05-17 – 2018-05-18 (×2): via INTRAVENOUS
  Administered 2018-05-18: 1000 mL via INTRAVENOUS

## 2018-05-17 MED ORDER — HEPARIN SODIUM (PORCINE) 5000 UNIT/ML IJ SOLN
5000.0000 [IU] | Freq: Three times a day (TID) | INTRAMUSCULAR | Status: DC
  Administered 2018-05-17 – 2018-05-19 (×5): 5000 [IU] via SUBCUTANEOUS
  Filled 2018-05-17 (×6): qty 1

## 2018-05-17 MED ORDER — FAMOTIDINE 20 MG PO TABS
20.0000 mg | ORAL_TABLET | Freq: Two times a day (BID) | ORAL | Status: DC
  Administered 2018-05-17 – 2018-05-28 (×22): 20 mg via ORAL
  Filled 2018-05-17 (×22): qty 1

## 2018-05-17 MED ORDER — ACETAMINOPHEN 325 MG PO TABS
650.0000 mg | ORAL_TABLET | Freq: Four times a day (QID) | ORAL | Status: DC | PRN
  Administered 2018-05-18: 650 mg via ORAL
  Filled 2018-05-17: qty 2

## 2018-05-17 MED ORDER — IOHEXOL 350 MG/ML SOLN
75.0000 mL | Freq: Once | INTRAVENOUS | Status: AC | PRN
  Administered 2018-05-17: 75 mL via INTRAVENOUS

## 2018-05-17 MED ORDER — ONDANSETRON HCL 4 MG/2ML IJ SOLN: 4.0000 mg | Freq: Four times a day (QID) | INTRAMUSCULAR | Status: DC | PRN

## 2018-05-17 MED ORDER — LABETALOL HCL 5 MG/ML IV SOLN
20.0000 mg | Freq: Once | INTRAVENOUS | Status: AC
  Administered 2018-05-17: 10 mg via INTRAVENOUS
  Filled 2018-05-17: qty 4

## 2018-05-17 NOTE — ED Notes (Signed)
emtala reviewed by this RN 

## 2018-05-17 NOTE — ED Triage Notes (Signed)
To room via [facility].  Pt sent from [facility] for back pain radiating to abd.  CTA shows thoracic aorta descending ulcers; AAA 4 cm.

## 2018-05-17 NOTE — ED Provider Notes (Signed)
[facility] Emergency Department Provider Note   ____________________________________________   I have reviewed the triage vital signs and the nursing notes.   HISTORY  Chief Complaint Back pain  History limited by: Not Limited   HPI Elizabeth [name] is a 77 y.o. female who presents to the emergency department today because of concerns for back pain.  She states the pain started today.  Pain is located in her middle back and goes up into her shoulder blades.  It does radiate around to her right side.  She states it is a severe constant pain.  She has not noticed any significant shortness of breath or nausea with the pain.  Patient denies any recent illness.  States she does have a history of an aortic aneurysm and is followed for that.   Per medical record review patient has a history of abdominal aortic aneurysm.   Past Medical History:  Diagnosis Date  . Hypertension   . Peripheral vascular disease [facility])     Patient Active Problem List   Diagnosis Date Noted  . Peripheral vascular disease (HCC) 02/14/2018  . Hypertension 02/14/2018  . AAA (abdominal aortic aneurysm) (HCC) 02/02/2016    Past Surgical History:  Procedure Laterality Date  . APPENDECTOMY    . PERIPHERAL VASCULAR CATHETERIZATION Right 02/02/2016   Procedure: Lower Extremity Angiography;  Surgeon: Annice Needy, MD;  Location: [facility] INVASIVE CV LAB;  Service: Cardiovascular;  Laterality: Right;    Prior to Admission medications   Medication Sig Start Date End Date Taking? Authorizing Provider  amLODipine (NORVASC) 10 MG tablet Take 10 mg by mouth daily. 01/04/16   [provider]  aspirin EC 81 MG EC tablet Take 1 tablet (81 mg total) by mouth daily. 02/03/16   Stegmayer, Ranae Plumber, PA-C  Aspirin-Acetaminophen-Caffeine (GOODY HEADACHE PO) Take 1 packet by mouth daily as needed (headache).    [provider]  clopidogrel (PLAVIX) 75 MG tablet Take 75 mg by mouth daily.  01/04/16   [provider]  Cyanocobalamin (VITAMIN B-12 PO) Take 1 tablet by mouth daily.    [provider]  HYDROcodone-acetaminophen (NORCO) 10-325 MG tablet Take 1 tablet by mouth every 6 (six) hours as needed. 01/24/16   [provider]  metoprolol (LOPRESSOR) 50 MG tablet Take 50 mg by mouth 2 (two) times daily. 01/04/16   [provider]  Multiple Vitamin (MULTIVITAMIN) tablet Take 1 tablet by mouth daily.    [provider]  potassium citrate (UROCIT-K) 10 MEQ (1080 MG) SR tablet Take 10 mEq by mouth 2 (two) times daily. 01/04/16   [provider]  pravastatin (PRAVACHOL) 40 MG tablet Take 40 mg by mouth daily. 01/04/16   [provider]  rosuvastatin (CRESTOR) 20 MG tablet Take 20 mg by mouth daily. 01/15/18   [provider]    Allergies Codeine and Sulfa antibiotics  Family History  Problem Relation Age of Onset  . Hypertension Mother   . Stroke Mother   . Hypertension Father   . Stroke Father     Social History Social History   Tobacco Use  . Smoking status: Former Smoker    Last attempt to quit: 02/02/1999    Years since quitting: 19.2  . Smokeless tobacco: Never Used  Substance Use Topics  . Alcohol use: No  . Drug use: No    Review of Systems Constitutional: No fever/chills Eyes: No visual changes. ENT: No sore throat. Cardiovascular: Denies chest pain. Respiratory: Denies shortness of  breath. Gastrointestinal: No abdominal pain.  No nausea, no vomiting.  No diarrhea.   Genitourinary: Negative for dysuria. Musculoskeletal: Positive for back pain.  Skin: Negative for rash. Neurological: Negative for headaches, focal weakness or numbness.  ____________________________________________   PHYSICAL EXAM:  VITAL SIGNS: ED Triage Vitals  Enc Vitals Group     BP 05/17/18 1014 (!) 173/73     Pulse Rate 05/17/18 1014 68     Resp 05/17/18 1014 18     Temp 05/17/18 1014 98.6 F (37 C)      Temp Source 05/17/18 1014 Oral     SpO2 05/17/18 1014 99 %     Weight 05/17/18 1015 130 lb (59 kg)     Height 05/17/18 1015 5\' 1"  (1.549 m)     Head Circumference --      Peak Flow --      Pain Score 05/17/18 1015 8   Constitutional: Alert and oriented.  Eyes: Conjunctivae are normal.  ENT      Head: Normocephalic and atraumatic.      Nose: No congestion/rhinnorhea.      Mouth/Throat: Mucous membranes are moist.      Neck: No stridor. Hematological/Lymphatic/Immunilogical: No cervical lymphadenopathy. Cardiovascular: Normal rate, regular rhythm.  No murmurs, rubs, or gallops. Respiratory: Normal respiratory effort without tachypnea nor retractions. Breath sounds are clear and equal bilaterally. No wheezes/rales/rhonchi. Gastrointestinal: Soft and non tender. No rebound. No guarding.  Genitourinary: Deferred Musculoskeletal: Normal range of motion in all extremities. No lower extremity edema. Neurologic:  Normal speech and language. No gross focal neurologic deficits are appreciated.  Skin:  Skin is warm, dry and intact. No rash noted. Psychiatric: Mood and affect are normal. Speech and behavior are normal. Patient exhibits appropriate insight and judgment.  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   ____________________________________________   PROCEDURES  Procedures  ____________________________________________   INITIAL IMPRESSION / ASSESSMENT AND PLAN / ED  COURSE  Pertinent labs & imaging results that were available during my care of the patient were reviewed by me and considered in my medical decision making (see chart for details).   Presented to the emergency department today because of concerns for back pain.  Patient does have a history of aortic aneurysm and given this history did have concern for aortic pathology.  CT scan was concerning for 2 aortic ulcerations.  Discussed this finding with the patient.  Will plan on transferring to [facility] for vascular surgery evaluation.  ____________________________________________   FINAL CLINICAL IMPRESSION(S) / ED DIAGNOSES  Final diagnoses:  Midline thoracic back pain, unspecified chronicity  Ulcer of aorta (HCC)     Note: This dictation was prepared with Dragon dictation. Any transcriptional errors that result from this process are unintentional     Phineas Semen, MD 05/18/18 [phone]

## 2018-05-17 NOTE — H&P (Signed)
PULMONARY / CRITICAL CARE MEDICINE   NAME:  Elizabeth [name], MRN:  [id number], DOB:  16-Aug-1940, LOS: 0 ADMISSION DATE:  05/17/2018,   CHIEF COMPLAINT: Severe back and subcostal pain  BRIEF HISTORY:    This is a 77 year old with a history of peripheral vascular disease who had the sudden onset of severe back pain this morning with radiation around both flanks just below the ribs.  The pain was noncolicky and nonpleuritic.  There is  no associated chest pain or shortness of breath.  She was making coffee at the time of onset there have been no unusual exertion.  A CTA of the chest is shown to ulcerations of the descending aorta and she was referred from [place] to here for definitive care. HISTORY OF PRESENT ILLNESS   She reports that she has had good compliance with her usual blood pressure medications, has not been using any street drugs, and had no unusual exertion prior to the onset of her pain.  She denies any loss of strength or pain in her lower extremities she denies any difficulty with bladder control. SIGNIFICANT PAST MEDICAL HISTORY   Markable primarily for peripheral vascular disease with bilateral carotid endarterectomies, and stenting of the iliac artery.  SIGNIFICANT EVENTS:   STUDIES:   CTA findings as noted with incidental finding of right renal artery occlusion and atrophy of the right kidney.  There is calcification of the proximal celiac and SMA but flow through the arteries. CULTURES:  None  ANTIBIOTICS:  None  LINES/TUBES:    CONSULTANTS:  Vascular surgery SUBJECTIVE:  He says her pain is somewhat improved with control of her blood pressure.  CONSTITUTIONAL: BP 131/87   Pulse (!) 48   Resp 16   SpO2 95%   No intake/output data recorded.        PHYSICAL EXAM: General: This is an elderly female who appears to be somewhat younger than her stated age.  She is in no obvious distress and is certainly not squirming on the litter. Neuro: Alert and oriented  x3 and entirely appropriately interactive she is using all fours. HEENT: She has bilateral carotid bruits right greater than left Cardiovascular: S1 and S2 are regular without murmur rub or gallop Lungs: Aspirations are unlabored, there is symmetric air movement, no wheezes, Abdomen: The abdomen is soft without any organomegaly masses tenderness guarding or rebound.  I do not hear bruits  musculoskeletal: The feet are pink and warm without edema Skin:    RESOLVED PROBLEM LIST   ASSESSMENT AND PLAN   Acute aortic dissection, descending thoracic aorta.  Beta-blocker and blood pressure control have been ordered with Cleviprex.  Vascular surgery has been consulted. Single functional kidney by CTA, generous vascularity has been ordered. Macrocytosis of unknown significance  SUMMARY OF TODAY'S PLAN:  Beta-blockade and blood pressure control.  Consult vascular surgery.  Hydrated as she is essentially one functional kidney  Best Practice / Goals of Care / Disposition.   DVT PROPHYLAXIS: Subcutaneous heparin SUP: Pepcid NUTRITION: N.p.o. for now MOBILITY: GOALS OF CARE: FAMILY DISCUSSIONS:  DISPOSITION   LABS  Glucose No results for input(s): GLUCAP in the last 168 hours.  BMET Recent Labs  Lab 05/17/18 1031 05/17/18 1616  NA 141 138  K 5.6* 4.0  CL 103 101  CO2 28 27  BUN 16 12  CREATININE 0.96 0.97  GLUCOSE 125* 125*    Liver Enzymes Recent Labs  Lab 05/17/18 1031  AST 40  ALT 16  ALKPHOS 86  BILITOT 1.1  ALBUMIN 4.6    Electrolytes Recent Labs  Lab 05/17/18 1031 05/17/18 1616  CALCIUM 9.6 9.3    CBC Recent Labs  Lab 05/17/18 1031 05/17/18 1616  WBC 11.2* 10.1  HGB 15.4* 15.0  HCT 46.2* 48.1*  PLT 239 245    ABG No results for input(s): PHART, PCO2ART, PO2ART in the last 168 hours.  Coag's No results for input(s): APTT, INR in the last 168 hours.  Sepsis Markers Recent Labs  Lab 05/17/18 1635  LATICACIDVEN 3.48*    Cardiac Enzymes No  results for input(s): TROPONINI, PROBNP in the last 168 hours.  PAST MEDICAL HISTORY :   She  has a past medical history of AAA (abdominal aortic aneurysm) (HCC), Hypertension, and Peripheral vascular disease (HCC).  PAST SURGICAL HISTORY:  She  has a past surgical history that includes Cardiac catheterization (Right, 02/02/2016) and Appendectomy.  Allergies  Allergen Reactions  . Codeine Other (See Comments)    Reaction: Unsure  . Sulfa Antibiotics Other (See Comments)    Mouth blisters    No current facility-administered medications on file prior to encounter.    Current Outpatient Medications on File Prior to Encounter  Medication Sig  . amLODipine (NORVASC) 10 MG tablet Take 10 mg by mouth daily.  [name] aspirin EC 81 MG EC tablet Take 1 tablet (81 mg total) by mouth daily. (Patient taking differently: Take 81 mg by mouth daily. Does not take if she has taken Goody HA powder)  . Aspirin-Acetaminophen-Caffeine (GOODY HEADACHE PO) Take 1 packet by mouth daily as needed (headache).  . cholecalciferol (VITAMIN D) 1000 units tablet Take 1,000 Units by mouth daily.  . clopidogrel (PLAVIX) 75 MG tablet Take 75 mg by mouth daily.  . Cyanocobalamin (VITAMIN B-12 PO) Take 1 tablet by mouth daily.  [name] HYDROcodone-acetaminophen (NORCO) 10-325 MG tablet Take 1 tablet by mouth every 6 (six) hours as needed.  . metoprolol (LOPRESSOR) 50 MG tablet Take 50 mg by mouth 2 (two) times daily.  . Multiple Vitamin (MULTIVITAMIN) tablet Take 1 tablet by mouth daily.  . potassium citrate (UROCIT-K) 10 MEQ (1080 MG) SR tablet Take 10 mEq by mouth 2 (two) times daily.  . rosuvastatin (CRESTOR) 20 MG tablet Take 20 mg by mouth daily.    FAMILY HISTORY:   Her family history includes Hypertension in her father and mother; Stroke in her father and mother.  SOCIAL HISTORY:  She  reports that she quit smoking about 19 years ago. She has never used smokeless tobacco. She reports that she does not drink alcohol or  use drugs.  REVIEW OF SYSTEMS:    10 system review of systems is remarkably noncontributory remarkably she is not diabetic.  She has no known history of lung disease and despite her vascular disease is not known to have any coronary disease she has never had a myocardial infarction chest pain arrhythmias or syncopal spells.  Greater than 32 minutes was spent in the care of this potentially unstable patient today  Penny Pia, MD

## 2018-05-17 NOTE — Progress Notes (Signed)
CRITICAL VALUE ALERT  Critical Value:  Temperature 101.2, trending upward throughout admission.  Date & Time Notied:  05/17/18; 2350  Provider Notified:Deterding, Dorise Hiss, MD  Orders Received/Actions taken: Orders received for PRN acetaminophen. See MAR.

## 2018-05-17 NOTE — Plan of Care (Signed)
  Problem: Clinical Measurements: Goal: Ability to maintain clinical measurements within normal limits will improve Outcome: Progressing Goal: Cardiovascular complication will be avoided Outcome: Progressing   Problem: Pain Managment: Goal: General experience of comfort will improve Outcome: Progressing   

## 2018-05-17 NOTE — Plan of Care (Signed)
  Problem: Education: Goal: Knowledge of General Education information will improve Description Including pain rating scale, medication(s)/side effects and non-pharmacologic comfort measures Outcome: Progressing   Problem: Health Behavior/Discharge Planning: Goal: Ability to manage health-related needs will improve Outcome: Progressing   Problem: Clinical Measurements: Goal: Ability to maintain clinical measurements within normal limits will improve Outcome: Progressing Goal: Will remain free from infection Outcome: Progressing Goal: Diagnostic test results will improve Outcome: Progressing Goal: Respiratory complications will improve Outcome: Progressing Goal: Cardiovascular complication will be avoided 05/17/2018 2206 by Almetta Lovely, RN Outcome: Progressing 05/17/2018 2205 by Almetta Lovely, RN Outcome: Progressing   Problem: Activity: Goal: Risk for activity intolerance will decrease Outcome: Progressing   Problem: Nutrition: Goal: Adequate nutrition will be maintained Outcome: Progressing   Problem: Coping: Goal: Level of anxiety will decrease Outcome: Progressing   Problem: Elimination: Goal: Will not experience complications related to bowel motility Outcome: Progressing Goal: Will not experience complications related to urinary retention Outcome: Progressing   Problem: Pain Managment: Goal: General experience of comfort will improve Outcome: Progressing   Problem: Safety: Goal: Ability to remain free from injury will improve Outcome: Progressing   Problem: Skin Integrity: Goal: Risk for impaired skin integrity will decrease Outcome: Progressing

## 2018-05-17 NOTE — ED Notes (Signed)
Patient transported to CT 

## 2018-05-17 NOTE — ED Notes (Signed)
Pt medicated after she used the bathroom, pt was able to ambulate to toilet in room to void without difficulty.  Pt being picked up by [facility].

## 2018-05-17 NOTE — Consult Note (Signed)
ED Consult    Reason for Consult:  Thoracic IMH Referring Physician:  ED Dr. Patria Mane MRN #:  [id number]  History of Present Illness: This is a 77 y.o. female with a known history of infrarenal abdominal aortic aneurysm has also had bilateral carotid endarterectomies and left common iliac artery stenting.  She is now in the emergency department after experiencing back pain in the upper abdomen radiating up to her chest this morning.  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  She is now transferred here.  She is not having any nausea or vomiting or abdominal pain.  Her legs feel at their normal state.  She does not have any neurologic deficits.  Pain is somewhat improved.  Initial blood pressure on presentation to [place] was systolic 180 mmHg and now is much lower.  She has never had such pain before.    Past Medical History:  Diagnosis Date  . AAA (abdominal aortic aneurysm) (HCC)   . Hypertension   . Peripheral vascular disease [facility])     Past Surgical History:  Procedure Laterality Date  . APPENDECTOMY    . PERIPHERAL VASCULAR CATHETERIZATION Right 02/02/2016   Procedure: Lower Extremity Angiography;  Surgeon: Annice Needy, MD;  Location: [facility] INVASIVE CV LAB;  Service: Cardiovascular;  Laterality: Right;    Allergies  Allergen Reactions  . Codeine Other (See Comments)    Reaction: Unsure  . Sulfa Antibiotics Other (See Comments)    Mouth blisters    Prior to Admission medications   Medication Sig Start Date End Date Taking? Authorizing Provider  amLODipine (NORVASC) 10 MG tablet Take 10 mg by mouth daily. 01/04/16  Yes [provider]  aspirin EC 81 MG EC tablet Take 1 tablet (81 mg total) by mouth daily. Patient taking differently: Take 81 mg by mouth daily. Does not take if she has taken Goody HA powder 02/03/16  Yes Stegmayer, Ranae Plumber, PA-C  Aspirin-Acetaminophen-Caffeine (GOODY HEADACHE PO) Take 1  packet by mouth daily as needed (headache).   Yes [provider]  cholecalciferol (VITAMIN D) 1000 units tablet Take 1,000 Units by mouth daily.   Yes [provider]  clopidogrel (PLAVIX) 75 MG tablet Take 75 mg by mouth daily. 01/04/16  Yes [provider]  Cyanocobalamin (VITAMIN B-12 PO) Take 1 tablet by mouth daily.   Yes [provider]  HYDROcodone-acetaminophen (NORCO) 10-325 MG tablet Take 1 tablet by mouth every 6 (six) hours as needed. 01/24/16  Yes [provider]  metoprolol (LOPRESSOR) 50 MG tablet Take 50 mg by mouth 2 (two) times daily. 01/04/16  Yes [provider]  Multiple Vitamin (MULTIVITAMIN) tablet Take 1 tablet by mouth daily.   Yes [provider]  potassium citrate (UROCIT-K) 10 MEQ (1080 MG) SR tablet Take 10 mEq by mouth 2 (two) times daily. 01/04/16  Yes [provider]  rosuvastatin (CRESTOR) 20 MG tablet Take 20 mg by mouth daily. 01/15/18  Yes [provider]    Social History   Socioeconomic History  . Marital status: Widowed    Spouse name: Not on file  . Number of children: Not on file  . Years of education: Not on file  . Highest education level: Not on file  Occupational History  . Not on file  Social Needs  . Financial resource strain: Not on file  . Food insecurity:    Worry: Not on file    Inability: Not on file  .  Transportation needs:    Medical: Not on file    Non-medical: Not on file  Tobacco Use  . Smoking status: Former Smoker    Last attempt to quit: 02/02/1999    Years since quitting: 19.2  . Smokeless tobacco: Never Used  Substance and Sexual Activity  . Alcohol use: No  . Drug use: No  . Sexual activity: Not Currently  Lifestyle  . Physical activity:    Days per week: Not on file    Minutes per session: Not on file  . Stress: Not on file  Relationships  . Social connections:    Talks on phone: Not on file    Gets together: Not on file    Attends  religious service: Not on file    Active member of club or organization: Not on file    Attends meetings of clubs or organizations: Not on file    Relationship status: Not on file  . Intimate partner violence:    Fear of current or ex partner: Not on file    Emotionally abused: Not on file    Physically abused: Not on file    Forced sexual activity: Not on file  Other Topics Concern  . Not on file  Social History Narrative  . Not on file     Family History  Problem Relation Age of Onset  . Hypertension Mother   . Stroke Mother   . Hypertension Father   . Stroke Father     ROS: [x]  Positive   [ ]  Negative   [ ]  All sytems reviewed and are negative Cardiovascular: [x]  chest pain/pressure []  palpitations []  SOB lying flat []  DOE []  pain in legs while walking []  pain in legs at rest []  pain in legs at night []  non-healing ulcers []  hx of DVT []  swelling in legs  Pulmonary: []  productive cough []  asthma/wheezing []  home O2  Neurologic: []  weakness in []  arms []  legs []  numbness in []  arms []  legs []  hx of CVA []  mini stroke [] difficulty speaking or slurred speech []  temporary loss of vision in one eye []  dizziness  Hematologic: []  hx of cancer []  bleeding problems []  problems with blood clotting easily  Endocrine:   []  diabetes []  thyroid disease  GI []  vomiting blood []  blood in stool  GU: []  CKD/renal failure []  HD--[]  M/W/F or []  T/T/S []  burning with urination []  blood in urine  Psychiatric: []  anxiety []  depression  Musculoskeletal: []  arthritis [x]  joint pain- right hip and shoulder  Integumentary: []  rashes []  ulcers  Constitutional: []  fever []  chills   Physical Examination  Vitals:   05/17/18 1630 05/17/18 1645  BP: (!) 108/91 131/87  Pulse: 93 (!) 48  Resp: 18 16  SpO2: 94% 95%   There is no height or weight on file to calculate BMI.  General: No acute distress HENT: WNL, normocephalic-there is a thrill present in her  left internal carotid artery Pulmonary: normal non-labored breathing Cardiac: Palpable right femoral pulse 2+ left 1+ there is a right palpable posterior tibial and left dorsalis pedis pulse Right radial pulses 2+ and left is faint at best Abdomen:  soft, NT/ND, no masses Extremities: Well-perfused Musculoskeletal: no muscle wasting or atrophy  Neurologic: A&O X 3; Appropriate Affect ; SENSATION: normal; MOTOR FUNCTION:  moving all extremities equally. Speech is fluent/normal  CBC    Component Value Date/Time   WBC 11.2 (H) 05/17/2018 1031   RBC 4.54 05/17/2018 1031   HGB 15.4 (  H) 05/17/2018 1031   HGB 15.3 10/02/2012 0608   HCT 46.2 (H) 05/17/2018 1031   HCT 44.6 10/02/2012 0608   PLT 239 05/17/2018 1031   PLT 262 10/02/2012 0608   MCV 101.8 (H) 05/17/2018 1031   MCV 101 (H) 10/02/2012 0608   MCH 33.9 05/17/2018 1031   MCHC 33.3 05/17/2018 1031   RDW 12.2 05/17/2018 1031   RDW 12.3 10/02/2012 0608   LYMPHSABS 1.6 10/02/2012 0608   MONOABS 0.6 10/02/2012 0608   EOSABS 0.0 10/02/2012 0608   BASOSABS 0.0 10/02/2012 0608    BMET    Component Value Date/Time   NA 141 05/17/2018 1031   NA 137 10/02/2012 0608   K 5.6 (H) 05/17/2018 1031   K 3.5 10/02/2012 0608   CL 103 05/17/2018 1031   CL 105 10/02/2012 0608   CO2 28 05/17/2018 1031   CO2 24 10/02/2012 0608   GLUCOSE 125 (H) 05/17/2018 1031   GLUCOSE 124 (H) 10/02/2012 0608   BUN 16 05/17/2018 1031   BUN 8 10/02/2012 0608   CREATININE 0.96 05/17/2018 1031   CREATININE 0.84 10/02/2012 0608   CALCIUM 9.6 05/17/2018 1031   CALCIUM 8.7 10/02/2012 0608   GFRNONAA 56 (L) 05/17/2018 1031   GFRNONAA >60 09/08/2012 1457   GFRAA >60 05/17/2018 1031   GFRAA >60 09/08/2012 1457    COAGS: Lab Results  Component Value Date   INR 1.0 10/02/2012   INR 0.9 04/18/2012     Non-Invasive Vascular Imaging:   IMPRESSION: Vascular:  1. 5 mm proximal descending thoracic aorta and 12 mm distal descending thoracic aorta  penetrating ulcers with adjacent intramural hematoma. No dissection. 2. Intermediate density thickening of the central pulmonary arteries could reflect a small amount of mediastinal hematoma versus possibly wall thickening related to underlying vasculitis. 3. 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 4. Interval enlargement of the infrarenal abdominal aortic aneurysm, now measuring 4.0 cm. Recommend followup by ultrasound in 1 year. This recommendation follows ACR consensus guidelines: White Paper of the ACR Incidental Findings Committee II on Vascular Findings. J Am Coll Radiol 2013; 10:789-794. 5. 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 Mild stenosis of the proximal left common carotid artery. 6. Mild stenosis of the celiac artery origin. Moderate stenosis of the SMA and left renal artery origins. 7. 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 8. Left common and external iliac stent with severe focal stenosis of the external iliac artery just distal to the stent.  I reviewed her CT scan in depth which demonstrates 2 small penetrating aortic ulcers the one in the proximal descending thoracic aorta the likely cause of her IMH.  There are no other scans of her thoracic aorta to compare but this is likely the cause of her pain.  She has a highly calcified left subclavian artery that may very well be occluded.  IMH appears to terminate just above the celiac artery where there is heavy calcification of both the aorta and the visceral branches.  The right renal artery appears diminutive as does the right kidney.  She has a 4 cm infrarenal abdominal aortic aneurysm which although somewhat larger in size than previous scanning does not appear to be the cause of her pain.  ASSESSMENT/PLAN: This is a 77 y.o. 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  She needs blood pressure and pain control.  I discussed with her the expected outcomes of improved pain and blood pressure with repeat CT scanning demonstrating no progression or improvement versus possible persistent pain and/or hypertension with worsening of the appearance on CT scan that would require surgical repair with thoracic endograft doing.  That approach would be complicated somewhat by her small access arteries as well as stenosis of her left common iliac artery stent.  This time would expect with conservative measures we can avoid surgery.  Plan to rescan in 48 to 72 hours from initial presentation.  Brandon C. Randie Heinz, MD [facility] Office: [phone] Pager: [phone]

## 2018-05-17 NOTE — ED Provider Notes (Addendum)
[facility] EMERGENCY DEPARTMENT Provider Note   CSN: [id number] Arrival date & time: 05/17/18  1528     History   Chief Complaint Chief Complaint  Patient presents with  . Back Pain  . Abdominal Pain    HPI Elizabeth [name] is a 77 y.o. female.  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  Patient presented the outside hospital with severe back pain radiating around to her chest anteriorly.  No significant shortness of breath.  No fevers or chills.  She is never had pain like this before.  She has a known abdominal aortic aneurysm.  This is being followed with serial examinations.  Patient presents the emergency department now as a transfer to be seen by the vascular surgery team.  She presents with some mild ongoing discomfort and pain at this time.   Past Medical History:  Diagnosis Date  . Hypertension   . Peripheral vascular disease [facility])     Patient Active Problem List   Diagnosis Date Noted  . Peripheral vascular disease (HCC) 02/14/2018  . Hypertension 02/14/2018  . AAA (abdominal aortic aneurysm) (HCC) 02/02/2016    Past Surgical History:  Procedure Laterality Date  . APPENDECTOMY    . PERIPHERAL VASCULAR CATHETERIZATION Right 02/02/2016   Procedure: Lower Extremity Angiography;  Surgeon: Annice Needy, MD;  Location: [facility] INVASIVE CV LAB;  Service: Cardiovascular;  Laterality: Right;     OB History   None      Home Medications    Prior to Admission medications   Medication Sig Start Date End Date Taking? Authorizing Provider  amLODipine (NORVASC) 10 MG tablet Take 10 mg by mouth daily. 01/04/16   [provider]  aspirin EC 81 MG EC tablet Take 1 tablet (81 mg total) by mouth daily. Patient taking differently: Take 81 mg by mouth daily. Does not take if she has taken Goody HA powder 02/03/16   Stegmayer,  Cala Bradford A, PA-C  Aspirin-Acetaminophen-Caffeine (GOODY HEADACHE PO) Take 1 packet by mouth daily as needed (headache).    [provider]  cholecalciferol (VITAMIN D) 1000 units tablet Take 1,000 Units by mouth daily.    [provider]  clopidogrel (PLAVIX) 75 MG tablet Take 75 mg by mouth daily. 01/04/16   [provider]  Cyanocobalamin (VITAMIN B-12 PO) Take 1 tablet by mouth daily.    [provider]  HYDROcodone-acetaminophen (NORCO) 10-325 MG tablet Take 1 tablet by mouth every 6 (six) hours as needed. 01/24/16   [provider]  metoprolol (LOPRESSOR) 50 MG tablet Take 50 mg by mouth 2 (two) times daily. 01/04/16   [provider]  Multiple Vitamin (MULTIVITAMIN) tablet Take 1 tablet by mouth daily.    [provider]  potassium citrate (UROCIT-K) 10 MEQ (1080 MG) SR tablet Take 10 mEq by mouth 2 (two) times daily. 01/04/16   [provider]  pravastatin (PRAVACHOL) 40 MG tablet Take 40 mg by mouth daily. 01/04/16   [provider]  rosuvastatin (CRESTOR) 20 MG tablet Take 20 mg by mouth daily. 01/15/18   [provider]    Family History Family History  Problem Relation Age of Onset  . Hypertension Mother   . Stroke Mother   . Hypertension Father   . Stroke Father     Social History Social History   Tobacco Use  . Smoking status: Former Smoker  Last attempt to quit: 02/02/1999    Years since quitting: 19.2  . Smokeless tobacco: Never Used  Substance Use Topics  . Alcohol use: No  . Drug use: No     Allergies   Codeine and Sulfa antibiotics   Review of Systems Review of Systems  All other systems reviewed and are negative.    Physical Exam Updated Vital Signs There were no vitals taken for this visit.  Physical Exam  Constitutional: She is oriented to person, place, and time. She appears well-developed and well-nourished. No distress.  HENT:  Head: Normocephalic and  atraumatic.  Eyes: EOM are normal.  Neck: Normal range of motion.  Cardiovascular: Normal rate, regular rhythm and normal heart sounds.  Pulmonary/Chest: Effort normal and breath sounds normal.  Abdominal: Soft. She exhibits no distension. There is no tenderness.  Musculoskeletal: Normal range of motion.  Neurological: She is alert and oriented to person, place, and time.  Skin: Skin is warm and dry.  Psychiatric: She has a normal mood and affect. Judgment normal.  Nursing note and vitals reviewed.    ED Treatments / Results  Labs (all labs ordered are listed, but only abnormal results are displayed) Labs Reviewed  CBC  BASIC METABOLIC PANEL  AMYLASE  LIPASE, BLOOD  I-STAT CG4 LACTIC ACID, ED  I-STAT TROPONIN, ED    EKG EKG Interpretation  Date/Time:  Saturday May 17 2018 16:08:31 EDT Ventricular Rate:  101 PR Interval:    QRS Duration: 76 QT Interval:  325 QTC Calculation: 422 R Axis:   61 Text Interpretation:  Sinus tachycardia Multiple premature complexes, vent & supraven nonspecific inferior st changes No old tracing to compare Confirmed by Azalia Bilis ([id number]) on 05/17/2018 4:23:30 PM   Radiology Ct Angio Chest/abd/pel For Dissection W And/or Wo Contrast  Result Date: 05/17/2018 CLINICAL DATA:  Right-sided back pain radiating around to the right upper abdomen. History of aortic aneurysm. EXAM: CT ANGIOGRAPHY CHEST, ABDOMEN AND PELVIS TECHNIQUE: Multidetector CT imaging through the chest, abdomen and pelvis was performed using the standard protocol during bolus administration of intravenous contrast. Multiplanar reconstructed images and MIPs were obtained and reviewed to evaluate the vascular anatomy. CONTRAST:  75mL OMNIPAQUE IOHEXOL 350 MG/ML SOLN COMPARISON:  CT abdomen pelvis dated Jan 01, 2011. FINDINGS: CTA CHEST FINDINGS Cardiovascular: There is crescentic high density along the left aspect of the distal descending thoracic aorta adjacent to a 12 mm  penetrating ulcer. Additional crescentic high density along the left aspect of the proximal descending aorta with smaller 5 mm penetrating ulcer along the medial proximal descending aorta just beyond the arch (series 8, image 77). No definite S section. 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 Coronary, aortic arch, and branch vessel atherosclerotic vascular disease. Mild stenosis of the proximal left common carotid artery. 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 Intermediate density thickening along the central pulmonary arteries. Normal heart size. No pericardial effusion. No central pulmonary embolism. Mediastinum/Nodes: No enlarged mediastinal, hilar, or axillary lymph nodes. Thyroid gland, trachea, and esophagus demonstrate no significant findings. Lungs/Pleura: No focal consolidation, pleural effusion, or pneumothorax. Calcified granuloma in the left upper lobe. No suspicious pulmonary nodule. Musculoskeletal: No chest wall abnormality. No acute or significant osseous findings. Review of the MIP images confirms the above findings. CTA ABDOMEN AND PELVIS FINDINGS VASCULAR Aorta: Interval enlargement of the infrarenal abdominal aortic aneurysm, now measuring 4.0 cm, previously 2.8 cm. Extensive atherosclerotic calcification. No dissection or significant stenosis. Celiac: Mild stenosis  of the origin due to calcific atherosclerosis. No aneurysm, dissection, or vasculitis. SMA: Moderate stenosis of the origin due to calcific atherosclerosis. No aneurysm, dissection, or vasculitis. Renals: Severe stenosis of the right renal artery origin with diminutive right renal artery. Moderate stenosis of the left renal artery origin due to calcific atherosclerosis. No aneurysm, dissection, or vasculitis. IMA: Patent without evidence of aneurysm, dissection, vasculitis or significant stenosis. Inflow: Left common and external iliac stent with severe focal stenosis at the distal stent.  No aneurysm, dissection, or vasculitis. Veins: No obvious venous abnormality within the limitations of this arterial phase study. Review of the MIP images confirms the above findings. NON-VASCULAR Hepatobiliary: No focal liver abnormality is seen. No gallstones, gallbladder wall thickening, or biliary dilatation. Pancreas: Unremarkable. No pancreatic ductal dilatation or surrounding inflammatory changes. Spleen: Normal in size without focal abnormality. Adrenals/Urinary Tract: The adrenal glands are unremarkable. Progressive now severe atrophy of the right kidney. Small nonobstructive left renal calculi. Small cyst in the left lower pole. No hydronephrosis. The bladder is decompressed. Stomach/Bowel: Stomach is within normal limits. Appendix is surgically absent. No evidence of bowel wall thickening, distention, or inflammatory changes. Moderate sigmoid diverticulosis. Lymphatic: No enlarged abdominal or pelvic lymph nodes. Reproductive: Uterus and bilateral adnexa are unremarkable. Other: Unchanged moderate fat containing left inguinal hernia. Probable small focal area of calcified fat necrosis in the inferior aspect of the hernia is unchanged. No free fluid or pneumoperitoneum. Musculoskeletal: No acute or significant osseous findings. Atrophy of the left gluteus muscles. Unchanged severe left hip osteoarthritis with deformity of the femoral head and acetabulum. Review of the MIP images confirms the above findings. IMPRESSION: Vascular: 1. 5 mm proximal descending thoracic aorta and 12 mm distal descending thoracic aorta penetrating ulcers with adjacent intramural hematoma. No dissection. 2. Intermediate density thickening of the central pulmonary arteries could reflect a small amount of mediastinal hematoma versus possibly wall thickening related to underlying vasculitis. 3. 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 4. Interval enlargement of the infrarenal abdominal aortic aneurysm,  now measuring 4.0 cm. Recommend followup by ultrasound in 1 year. This recommendation follows ACR consensus guidelines: White Paper of the ACR Incidental Findings Committee II on Vascular Findings. J Am Coll Radiol 2013; 10:789-794. 5. 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 Mild stenosis of the proximal left common carotid artery. 6. Mild stenosis of the celiac artery origin. Moderate stenosis of the SMA and left renal artery origins. 7. 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 8. Left common and external iliac stent with severe focal stenosis of the external iliac artery just distal to the stent. Other: 1. Nonobstructive left nephrolithiasis. 2. Moderate fat containing left inguinal hernia. These results were called by telephone at the time of interpretation on 05/17/2018 at 1:06 pm to Dr. Phineas Semen , who verbally acknowledged these results. Electronically Signed   By: Obie Dredge M.D.   On: 05/17/2018 13:09    Procedures .Critical Care Performed by: Azalia Bilis, MD Authorized by: Azalia Bilis, MD     CRITICAL CARE Performed by: Azalia Bilis Total critical care time: 31 minutes Critical care time was exclusive of separately billable procedures and treating other patients. Critical care was necessary to treat or prevent imminent or life-threatening deterioration. Critical care was time spent personally by me on the following activities: development of treatment plan with patient and/or surrogate as well as nursing, discussions with consultants, evaluation of  patient's response to treatment, examination of patient, obtaining history from patient or surrogate, ordering and performing treatments and interventions, ordering and review of laboratory studies, ordering and review of radiographic studies, pulse oximetry and re-evaluation of patient's condition.   Medications Ordered in ED Medications - No data to  display   Initial Impression / Assessment and Plan / ED Course  I have reviewed the triage vital signs and the nursing notes.  Pertinent labs & imaging results that were available during my care of the patient were reviewed by me and considered in my medical decision making (see chart for details).     Accepted in transfer by Dr Randie Heinz, Vascular.  I spoke with Dr. Randie Heinz and he is recommending placement in the intensive care unit with the intensivist aggressively managing her blood pressure.  IV labetalol now.  He will see the patient in consultation.  Patient still has some mild ongoing chest discomfort at this time.  Repeat EKG now. I don't see and ecg or troponin from the OSH. Will obtain now.  Will repeat hgb. will repeat BMP to make sure noted hyperkalemia was hemolysis    Final Clinical Impressions(s) / ED Diagnoses   Final diagnoses:  Penetrating atherosclerotic ulcer of aorta [facility])    ED Discharge Orders    None       Azalia Bilis, MD 05/17/18 1600    Azalia Bilis, MD 05/17/18 1605    Azalia Bilis, MD 05/17/18 1624

## 2018-05-17 NOTE — Progress Notes (Signed)
CRITICAL VALUE ALERT  Critical Value:  Troponin 0.03  Date & Time Notied:  05/17/18 2310  Provider Notified: Elita Boone  Orders Received/Actions taken: No orders. Patient asymptomatic.

## 2018-05-17 NOTE — ED Triage Notes (Signed)
States  Began pain R back that radiates aroudn to R upper abdomen about 7am today.

## 2018-05-18 DIAGNOSIS — I7100 Dissection of unspecified site of aorta: Secondary | ICD-10-CM

## 2018-05-18 LAB — MAGNESIUM: Magnesium: 1.8 mg/dL (ref 1.7–2.4)

## 2018-05-18 LAB — CBC
HCT: 43 % (ref 36.0–46.0)
Hemoglobin: 13.9 g/dL (ref 12.0–15.0)
MCH: 33 pg (ref 26.0–34.0)
MCHC: 32.3 g/dL (ref 30.0–36.0)
MCV: 102.1 fL — AB (ref 80.0–100.0)
NRBC: 0 % (ref 0.0–0.2)
PLATELETS: 211 10*3/uL (ref 150–400)
RBC: 4.21 MIL/uL (ref 3.87–5.11)
RDW: 12.6 % (ref 11.5–15.5)
WBC: 6.9 10*3/uL (ref 4.0–10.5)

## 2018-05-18 LAB — COMPREHENSIVE METABOLIC PANEL
ALK PHOS: 62 U/L (ref 38–126)
ALT: 15 U/L (ref 0–44)
AST: 28 U/L (ref 15–41)
Albumin: 3.6 g/dL (ref 3.5–5.0)
Anion gap: 11 (ref 5–15)
BILIRUBIN TOTAL: 0.9 mg/dL (ref 0.3–1.2)
BUN: 9 mg/dL (ref 8–23)
CALCIUM: 9.1 mg/dL (ref 8.9–10.3)
CO2: 24 mmol/L (ref 22–32)
CREATININE: 0.8 mg/dL (ref 0.44–1.00)
Chloride: 104 mmol/L (ref 98–111)
GFR calc non Af Amer: >60 mL/min (ref >60)
Glucose, Bld: 111 mg/dL — ABNORMAL HIGH (ref 70–99)
Potassium: 3.4 mmol/L — ABNORMAL LOW (ref 3.5–5.1)
SODIUM: 139 mmol/L (ref 135–145)
Total Protein: 7 g/dL (ref 6.5–8.1)

## 2018-05-18 LAB — TROPONIN I: Troponin I: 0.48 ng/mL (ref <0.03)

## 2018-05-18 LAB — TRIGLYCERIDES: Triglycerides: 148 mg/dL (ref <150)

## 2018-05-18 MED ORDER — DOCUSATE SODIUM 100 MG PO CAPS
100.0000 mg | ORAL_CAPSULE | Freq: Every day | ORAL | Status: DC
  Administered 2018-05-18 – 2018-05-26 (×6): 100 mg via ORAL
  Filled 2018-05-18 (×11): qty 1

## 2018-05-18 MED ORDER — FENTANYL CITRATE (PF) 100 MCG/2ML IJ SOLN
100.0000 ug | INTRAMUSCULAR | Status: DC | PRN
  Administered 2018-05-18: 100 ug via INTRAVENOUS
  Administered 2018-05-19: 25 ug via INTRAVENOUS
  Administered 2018-05-19 – 2018-05-26 (×5): 100 ug via INTRAVENOUS
  Filled 2018-05-18 (×8): qty 2

## 2018-05-18 MED ORDER — FENTANYL CITRATE (PF) 100 MCG/2ML IJ SOLN
100.0000 ug | Freq: Once | INTRAMUSCULAR | Status: AC
  Administered 2018-05-18: 100 ug via INTRAVENOUS
  Filled 2018-05-18: qty 2

## 2018-05-18 MED ORDER — FENTANYL CITRATE (PF) 100 MCG/2ML IJ SOLN
INTRAMUSCULAR | Status: AC
  Administered 2018-05-18: 100 ug
  Filled 2018-05-18: qty 2

## 2018-05-18 MED ORDER — ORAL CARE MOUTH RINSE
15.0000 mL | Freq: Two times a day (BID) | OROMUCOSAL | Status: DC
  Administered 2018-05-18 – 2018-05-28 (×17): 15 mL via OROMUCOSAL

## 2018-05-18 MED ORDER — OXYCODONE HCL ER 10 MG PO T12A
10.0000 mg | EXTENDED_RELEASE_TABLET | Freq: Two times a day (BID) | ORAL | Status: DC
  Administered 2018-05-18 – 2018-05-28 (×21): 10 mg via ORAL
  Filled 2018-05-18 (×21): qty 1

## 2018-05-18 MED ORDER — POLYETHYLENE GLYCOL 3350 17 G PO PACK: 17.0000 g | PACK | Freq: Every day | ORAL | Status: DC | PRN

## 2018-05-18 MED ORDER — LABETALOL HCL 100 MG PO TABS
100.0000 mg | ORAL_TABLET | Freq: Two times a day (BID) | ORAL | Status: DC
  Administered 2018-05-18 – 2018-05-19 (×3): 100 mg via ORAL
  Filled 2018-05-18 (×3): qty 1

## 2018-05-18 MED ORDER — METOPROLOL TARTRATE 5 MG/5ML IV SOLN
5.0000 mg | Freq: Four times a day (QID) | INTRAVENOUS | Status: DC | PRN
  Administered 2018-05-18 – 2018-05-20 (×2): 5 mg via INTRAVENOUS
  Filled 2018-05-18 (×2): qty 5

## 2018-05-18 MED ORDER — FENTANYL CITRATE (PF) 100 MCG/2ML IJ SOLN: 100.0000 ug | Freq: Once | INTRAMUSCULAR | Status: AC

## 2018-05-18 MED ORDER — HYDROCODONE-ACETAMINOPHEN 7.5-325 MG PO TABS
1.0000 | ORAL_TABLET | ORAL | Status: DC | PRN
  Administered 2018-05-18 – 2018-05-28 (×30): 1 via ORAL
  Filled 2018-05-18 (×31): qty 1

## 2018-05-18 NOTE — Plan of Care (Signed)
  Problem: Education: Goal: Knowledge of General Education information will improve Description Including pain rating scale, medication(s)/side effects and non-pharmacologic comfort measures Outcome: Progressing   Problem: Health Behavior/Discharge Planning: Goal: Ability to manage health-related needs will improve Outcome: Progressing   Problem: Clinical Measurements: Goal: Ability to maintain clinical measurements within normal limits will improve Outcome: Progressing Goal: Will remain free from infection Outcome: Progressing Goal: Diagnostic test results will improve Outcome: Progressing Goal: Cardiovascular complication will be avoided Outcome: Progressing   Problem: Nutrition: Goal: Adequate nutrition will be maintained Outcome: Progressing   Problem: Coping: Goal: Level of anxiety will decrease Outcome: Progressing   Problem: Elimination: Goal: Will not experience complications related to urinary retention Outcome: Progressing   Problem: Pain Managment: Goal: General experience of comfort will improve Outcome: Progressing

## 2018-05-18 NOTE — Progress Notes (Signed)
MD Wallace Cullens aware of elevated troponin 0.48. No orders at this time.  Norva Karvonen, RN

## 2018-05-18 NOTE — Progress Notes (Signed)
PULMONARY / CRITICAL CARE MEDICINE   NAME:  Elizabeth [name], MRN:  [id number], DOB:  Jan 16, 1941, LOS: 1 ADMISSION DATE:  05/17/2018,, CHIEF COMPLAINT: Back pain  BRIEF HISTORY:    77 year old with severe peripheral vascular disease presenting with back pain radiating beneath both costal margins bilaterally found to have descending thoracic aorta ulcerations. HISTORY OF PRESENT ILLNESS   She reports that she has had good compliance with her usual blood pressure medications, has not been using any street drugs, and had no unusual exertion prior to the onset of her pain.  She denies any loss of strength or pain in her lower extremities she denies any difficulty with bladder control SIGNIFICANT PAST MEDICAL HISTORY   Peripheral vascular disease, bilateral carotid endarterectomy, stenting of the iliac artery.  SIGNIFICANT EVENTS:  CTA on 10/12 STUDIES:    CULTURES:    ANTIBIOTICS:  None  LINES/TUBES:   None CONSULTANTS:  [facility] SUBJECTIVE:  Is requiring high doses of Cleviprex for blood pressure control.  She reports her usual medications at home are metoprolol and Norvasc.  77 normally takes regular doses of Norco for chronic pain.  She is reporting persistent back pain this morning, there is no intra-abdominal pain there is no pain in the lower extremities.  CONSTITUTIONAL: BP (!) 143/75   Pulse (!) 113   Temp 99.1 F (37.3 C) (Oral)   Resp 18   Ht 5\' 1"  (1.549 m)   Wt 63.6 kg   SpO2 97%   BMI 26.49 kg/m   I/O last 3 completed shifts: In: 1503.9 [P.O.:90; I.V.:1413.9] Out: 600 [Urine:600]        PHYSICAL EXAM: General: Elderly female in no obvious distress. Neuro: Alert and oriented x3 moving all fours HEENT: Bilateral carotid bruits Cardiovascular: S1 and S2 are regular without murmur rub or gallop Lungs: Respirations are unlabored, there is symmetric air movement, no wheezes. Abdomen: The abdomen is flat and soft without any organomegaly masses or  tenderness Musculoskeletal: Feet are pink and warm Skin: no Edema  RESOLVED PROBLEM LIST   ASSESSMENT AND PLAN   Descending thoracic aortic dissection.  Continue beta-blockade and blood pressure control with Cleviprex for now.  I am increasing her oral antihypertensive agents hoping to wean the Cleviprex to off.  I am attempting to improve control of her chronic pain as an adjunct to getting her blood pressure under control.  SUMMARY OF TODAY'S PLAN:    Best Practice / Goals of Care / Disposition.   DVT PROPHYLAXIS: sub Cutaneous heparin SUP: Diet has been ordered NUTRITION: MOBILITY: GOALS OF CARE: LABS  Glucose No results for input(s): GLUCAP in the last 168 hours.  BMET Recent Labs  Lab 05/17/18 1031 05/17/18 1616 05/18/18 0617  NA 141 138 139  K 5.6* 4.0 3.4*  CL 103 101 104  CO2 28 27 24   BUN 16 12 9   CREATININE 0.96 0.97 0.80  GLUCOSE 125* 125* 111*    Liver Enzymes Recent Labs  Lab 05/17/18 1031 05/18/18 0617  AST 40 28  ALT 16 15  ALKPHOS 86 62  BILITOT 1.1 0.9  ALBUMIN 4.6 3.6    Electrolytes Recent Labs  Lab 05/17/18 1031 05/17/18 1616 05/18/18 0617  CALCIUM 9.6 9.3 9.1  MG  --   --  1.8    CBC Recent Labs  Lab 05/17/18 1031 05/17/18 1616 05/18/18 0617  WBC 11.2* 10.1 6.9  HGB 15.4* 15.0 13.9  HCT 46.2* 48.1* 43.0  PLT 239 245 211  ABG No results for input(s): PHART, PCO2ART, PO2ART in the last 168 hours.  Coag's Recent Labs  Lab 05/17/18 1648  INR 0.98    Sepsis Markers Recent Labs  Lab 05/17/18 1635  LATICACIDVEN 3.48*    Cardiac Enzymes Recent Labs  Lab 05/17/18 1621 05/17/18 2110 05/18/18 0617  TROPONINI <0.03 0.03* 0.48*    PAST MEDICAL HISTORY :   She  has a past medical history of AAA (abdominal aortic aneurysm) (HCC), Hypertension, and Peripheral vascular disease (HCC).  PAST SURGICAL HISTORY:  She  has a past surgical history that includes Cardiac catheterization (Right, 02/02/2016) and  Appendectomy.  Allergies  Allergen Reactions  . Codeine Other (See Comments)    Reaction: Unsure  . Sulfa Antibiotics Other (See Comments)    Mouth blisters    No current facility-administered medications on file prior to encounter.    Current Outpatient Medications on File Prior to Encounter  Medication Sig  . amLODipine (NORVASC) 10 MG tablet Take 10 mg by mouth daily.  [name] aspirin EC 81 MG EC tablet Take 1 tablet (81 mg total) by mouth daily. (Patient taking differently: Take 81 mg by mouth daily. Does not take if she has taken Goody HA powder)  . Aspirin-Acetaminophen-Caffeine (GOODY HEADACHE PO) Take 1 packet by mouth daily as needed (headache).  . cholecalciferol (VITAMIN D) 1000 units tablet Take 1,000 Units by mouth daily.  . clopidogrel (PLAVIX) 75 MG tablet Take 75 mg by mouth daily.  . Cyanocobalamin (VITAMIN B-12 PO) Take 1 tablet by mouth daily.  [name] HYDROcodone-acetaminophen (NORCO) 10-325 MG tablet Take 1 tablet by mouth every 6 (six) hours as needed.  . metoprolol (LOPRESSOR) 50 MG tablet Take 50 mg by mouth 2 (two) times daily.  . Multiple Vitamin (MULTIVITAMIN) tablet Take 1 tablet by mouth daily.  . potassium citrate (UROCIT-K) 10 MEQ (1080 MG) SR tablet Take 10 mEq by mouth 2 (two) times daily.  . rosuvastatin (CRESTOR) 20 MG tablet Take 20 mg by mouth daily.    FAMILY HISTORY:   Her family history includes Hypertension in her father and mother; Stroke in her father and mother.  SOCIAL HISTORY:  She  reports that she quit smoking about 19 years ago. She has never used smokeless tobacco. She reports that she does not drink alcohol or use drugs.  REVIEW OF SYSTEMS:     WJ Gray,MD

## 2018-05-18 NOTE — Progress Notes (Signed)
  Progress Note    05/18/2018 11:28 AM * No surgery found *  Subjective: pain remains although improved  Vitals:   05/18/18 1045 05/18/18 1100  BP: 133/63 (!) 143/75  Pulse: (!) 114 (!) 113  Resp: 16 18  Temp:    SpO2: 97% 97%    Physical Exam: aaox3 Moving all extremities well Abdomen is soft 2+ right femoral pulse, no left femoral pulse Feet are well perfused  CBC    Component Value Date/Time   WBC 6.9 05/18/2018 0617   RBC 4.21 05/18/2018 0617   HGB 13.9 05/18/2018 0617   HGB 15.3 10/02/2012 0608   HCT 43.0 05/18/2018 0617   HCT 44.6 10/02/2012 0608   PLT 211 05/18/2018 0617   PLT 262 10/02/2012 0608   MCV 102.1 (H) 05/18/2018 0617   MCV 101 (H) 10/02/2012 0608   MCH 33.0 05/18/2018 0617   MCHC 32.3 05/18/2018 0617   RDW 12.6 05/18/2018 0617   RDW 12.3 10/02/2012 0608   LYMPHSABS 1.6 10/02/2012 0608   MONOABS 0.6 10/02/2012 0608   EOSABS 0.0 10/02/2012 0608   BASOSABS 0.0 10/02/2012 0608    BMET    Component Value Date/Time   NA 139 05/18/2018 0617   NA 137 10/02/2012 0608   K 3.4 (L) 05/18/2018 0617   K 3.5 10/02/2012 0608   CL 104 05/18/2018 0617   CL 105 10/02/2012 0608   CO2 24 05/18/2018 0617   CO2 24 10/02/2012 0608   GLUCOSE 111 (H) 05/18/2018 0617   GLUCOSE 124 (H) 10/02/2012 0608   BUN 9 05/18/2018 0617   BUN 8 10/02/2012 0608   CREATININE 0.80 05/18/2018 0617   CREATININE 0.84 10/02/2012 0608   CALCIUM 9.1 05/18/2018 0617   CALCIUM 8.7 10/02/2012 0608   GFRNONAA >60 05/18/2018 0617   GFRNONAA >60 09/08/2012 1457   GFRAA >60 05/18/2018 0617   GFRAA >60 09/08/2012 1457    INR    Component Value Date/Time   INR 0.98 05/17/2018 1648   INR 1.0 10/02/2012 0608     Intake/Output Summary (Last 24 hours) at 05/18/2018 1128 Last data filed at 05/18/2018 1000 Gross per 24 hour  Intake 1611.91 ml  Output 1100 ml  Net 511.91 ml     Assessment:  77 y.o. female is here with acute aortic syndrome Plan: Ok for oob to  chair Ordered carotid duplex for evaluation Will plan re-CT  Tuesday if symptoms continue to improve  Mara Favero C. Randie Heinz, MD [facility] Office: [phone] Pager: [phone]  05/18/2018 11:28 AM

## 2018-05-19 ENCOUNTER — Inpatient Hospital Stay (HOSPITAL_COMMUNITY): Payer: Medicare Other

## 2018-05-19 ENCOUNTER — Encounter (HOSPITAL_COMMUNITY)
Admission: EM | Disposition: A | Discharge status: Home / Self Care | Payer: Self-pay | Source: Home / Self Care | Attending: Internal Medicine

## 2018-05-19 DIAGNOSIS — I252 Old myocardial infarction: Secondary | ICD-10-CM

## 2018-05-19 DIAGNOSIS — I214 Non-ST elevation (NSTEMI) myocardial infarction: Secondary | ICD-10-CM

## 2018-05-19 DIAGNOSIS — I25110 Atherosclerotic heart disease of native coronary artery with unstable angina pectoris: Secondary | ICD-10-CM

## 2018-05-19 DIAGNOSIS — R079 Chest pain, unspecified: Secondary | ICD-10-CM

## 2018-05-19 DIAGNOSIS — I25118 Atherosclerotic heart disease of native coronary artery with other forms of angina pectoris: Secondary | ICD-10-CM

## 2018-05-19 DIAGNOSIS — I7103 Dissection of thoracoabdominal aorta: Secondary | ICD-10-CM

## 2018-05-19 DIAGNOSIS — I10 Essential (primary) hypertension: Secondary | ICD-10-CM

## 2018-05-19 DIAGNOSIS — I350 Nonrheumatic aortic (valve) stenosis: Secondary | ICD-10-CM

## 2018-05-19 HISTORY — PX: LEFT HEART CATH AND CORONARY ANGIOGRAPHY: CATH118249

## 2018-05-19 LAB — RENAL FUNCTION PANEL
ANION GAP: 8 (ref 5–15)
Albumin: 3.3 g/dL — ABNORMAL LOW (ref 3.5–5.0)
BUN: 12 mg/dL (ref 8–23)
CO2: 27 mmol/L (ref 22–32)
Calcium: 8.9 mg/dL (ref 8.9–10.3)
Chloride: 104 mmol/L (ref 98–111)
Creatinine, Ser: 0.81 mg/dL (ref 0.44–1.00)
GFR calc Af Amer: >60 mL/min (ref >60)
Glucose, Bld: 112 mg/dL — ABNORMAL HIGH (ref 70–99)
POTASSIUM: 4.1 mmol/L (ref 3.5–5.1)
Phosphorus: 2.4 mg/dL — ABNORMAL LOW (ref 2.5–4.6)
Sodium: 139 mmol/L (ref 135–145)

## 2018-05-19 LAB — TROPONIN I: TROPONIN I: 7.5 ng/mL — AB (ref <0.03)

## 2018-05-19 LAB — MAGNESIUM: Magnesium: 2.1 mg/dL (ref 1.7–2.4)

## 2018-05-19 SURGERY — LEFT HEART CATH AND CORONARY ANGIOGRAPHY
Anesthesia: LOCAL

## 2018-05-19 MED ORDER — ONDANSETRON HCL 4 MG/2ML IJ SOLN: 4.0000 mg | Freq: Four times a day (QID) | INTRAMUSCULAR | Status: DC | PRN

## 2018-05-19 MED ORDER — MIDAZOLAM HCL 2 MG/2ML IJ SOLN
INTRAMUSCULAR | Status: AC
  Filled 2018-05-19: qty 2

## 2018-05-19 MED ORDER — SODIUM CHLORIDE 0.9% FLUSH: 3.0000 mL | INTRAVENOUS | Status: DC | PRN

## 2018-05-19 MED ORDER — METOPROLOL TARTRATE 25 MG PO TABS
25.0000 mg | ORAL_TABLET | Freq: Once | ORAL | Status: AC
  Administered 2018-05-19: 25 mg via ORAL
  Filled 2018-05-19: qty 1

## 2018-05-19 MED ORDER — POLYETHYLENE GLYCOL 3350 17 G PO PACK
17.0000 g | PACK | Freq: Every day | ORAL | Status: DC
  Administered 2018-05-20 – 2018-05-24 (×4): 17 g via ORAL
  Filled 2018-05-19 (×7): qty 1

## 2018-05-19 MED ORDER — SODIUM CHLORIDE 0.9 % IV SOLN: 250.0000 mL | INTRAVENOUS | Status: DC | PRN

## 2018-05-19 MED ORDER — ACETAMINOPHEN 325 MG PO TABS: 650.0000 mg | ORAL_TABLET | ORAL | Status: DC | PRN

## 2018-05-19 MED ORDER — VERAPAMIL HCL 2.5 MG/ML IV SOLN
INTRAVENOUS | Status: AC
  Filled 2018-05-19: qty 2

## 2018-05-19 MED ORDER — LIDOCAINE HCL (PF) 1 % IJ SOLN
INTRAMUSCULAR | Status: DC | PRN
  Administered 2018-05-19: 2 mL

## 2018-05-19 MED ORDER — METOPROLOL TARTRATE 50 MG PO TABS
50.0000 mg | ORAL_TABLET | Freq: Two times a day (BID) | ORAL | Status: DC
  Administered 2018-05-19 – 2018-05-25 (×13): 50 mg via ORAL
  Filled 2018-05-19 (×13): qty 1

## 2018-05-19 MED ORDER — HEPARIN (PORCINE) IN NACL 100-0.45 UNIT/ML-% IJ SOLN
700.0000 [IU]/h | INTRAMUSCULAR | Status: DC
  Administered 2018-05-19: 700 [IU]/h via INTRAVENOUS
  Filled 2018-05-19: qty 250

## 2018-05-19 MED ORDER — SODIUM CHLORIDE 0.9% FLUSH: 3.0000 mL | Freq: Two times a day (BID) | INTRAVENOUS | Status: DC

## 2018-05-19 MED ORDER — ASPIRIN 81 MG PO CHEW: 81.0000 mg | CHEWABLE_TABLET | ORAL | Status: DC

## 2018-05-19 MED ORDER — VERAPAMIL HCL 2.5 MG/ML IV SOLN
INTRAVENOUS | Status: DC | PRN
  Administered 2018-05-19: 10 mL via INTRA_ARTERIAL

## 2018-05-19 MED ORDER — IOHEXOL 350 MG/ML SOLN
INTRAVENOUS | Status: DC | PRN
  Administered 2018-05-19: 55 mL via INTRA_ARTERIAL

## 2018-05-19 MED ORDER — SODIUM CHLORIDE 0.9 % WEIGHT BASED INFUSION: 1.0000 mL/kg/h | INTRAVENOUS | Status: DC

## 2018-05-19 MED ORDER — HEPARIN (PORCINE) IN NACL 1000-0.9 UT/500ML-% IV SOLN
INTRAVENOUS | Status: DC | PRN
  Administered 2018-05-19 (×3): 500 mL

## 2018-05-19 MED ORDER — HEPARIN (PORCINE) IN NACL 1000-0.9 UT/500ML-% IV SOLN
INTRAVENOUS | Status: AC
  Filled 2018-05-19: qty 500

## 2018-05-19 MED ORDER — SODIUM CHLORIDE 0.9 % IV SOLN: INTRAVENOUS | Status: AC

## 2018-05-19 MED ORDER — HEPARIN SODIUM (PORCINE) 1000 UNIT/ML IJ SOLN
INTRAMUSCULAR | Status: DC | PRN
  Administered 2018-05-19: 3000 [IU] via INTRAVENOUS

## 2018-05-19 MED ORDER — HEPARIN (PORCINE) IN NACL 100-0.45 UNIT/ML-% IJ SOLN
1150.0000 [IU]/h | INTRAMUSCULAR | Status: DC
  Administered 2018-05-20: 700 [IU]/h via INTRAVENOUS
  Administered 2018-05-21: 900 [IU]/h via INTRAVENOUS
  Administered 2018-05-22: 1150 [IU]/h via INTRAVENOUS
  Filled 2018-05-19 (×2): qty 250

## 2018-05-19 MED ORDER — FENTANYL CITRATE (PF) 100 MCG/2ML IJ SOLN
INTRAMUSCULAR | Status: AC
  Filled 2018-05-19: qty 2

## 2018-05-19 MED ORDER — FENTANYL CITRATE (PF) 100 MCG/2ML IJ SOLN
INTRAMUSCULAR | Status: DC | PRN
  Administered 2018-05-19: 25 ug via INTRAVENOUS

## 2018-05-19 MED ORDER — POTASSIUM CITRATE ER 10 MEQ (1080 MG) PO TBCR
10.0000 meq | EXTENDED_RELEASE_TABLET | Freq: Two times a day (BID) | ORAL | Status: DC
  Administered 2018-05-20 – 2018-05-28 (×17): 10 meq via ORAL
  Filled 2018-05-19 (×20): qty 1

## 2018-05-19 MED ORDER — LIDOCAINE HCL (PF) 1 % IJ SOLN
INTRAMUSCULAR | Status: AC
  Filled 2018-05-19: qty 30

## 2018-05-19 MED ORDER — MIDAZOLAM HCL 2 MG/2ML IJ SOLN
INTRAMUSCULAR | Status: DC | PRN
  Administered 2018-05-19: 0.5 mg via INTRAVENOUS

## 2018-05-19 MED ORDER — SODIUM CHLORIDE 0.9% FLUSH
3.0000 mL | Freq: Two times a day (BID) | INTRAVENOUS | Status: DC
  Administered 2018-05-20 – 2018-05-28 (×14): 3 mL via INTRAVENOUS

## 2018-05-19 MED ORDER — SODIUM CHLORIDE 0.9 % WEIGHT BASED INFUSION
3.0000 mL/kg/h | INTRAVENOUS | Status: DC
  Administered 2018-05-19: 3 mL/kg/h via INTRAVENOUS

## 2018-05-19 SURGICAL SUPPLY — 10 items
CATH 5FR JL3.5 JR4 ANG PIG MP (CATHETERS) ×2 IMPLANT
DEVICE RAD TR BAND REGULAR (VASCULAR PRODUCTS) ×2 IMPLANT
GLIDESHEATH SLEND A-KIT 6F 22G (SHEATH) ×2 IMPLANT
GUIDEWIRE INQWIRE 1.5J.035X260 (WIRE) ×1 IMPLANT
INQWIRE 1.5J .035X260CM (WIRE) ×2
KIT HEART LEFT (KITS) ×2 IMPLANT
PACK CARDIAC CATHETERIZATION (CUSTOM PROCEDURE TRAY) ×2 IMPLANT
SHEATH PROBE COVER 6X72 (BAG) ×2 IMPLANT
TRANSDUCER W/STOPCOCK (MISCELLANEOUS) ×2 IMPLANT
TUBING CIL FLEX 10 FLL-RA (TUBING) ×2 IMPLANT

## 2018-05-19 NOTE — Progress Notes (Signed)
Called with critical troponin, trend 0.03 -> 0.48 -> to now 7.5 EKG with increased STD in ant/lateral leads    P:  Cards consulted  NPO for now  Posey Boyer, AGACNP-BC [facility] Pgr: [phone] or if no answer [phone] 05/19/2018, 11:37 AM

## 2018-05-19 NOTE — CV Procedure (Signed)
   LHC and coronary angio via right ulnar. Absent right radial by [place] interrogation.  Patent hemostasis post procedure and with pre-procedure provokable recruitment of pleth signal with ulnar compression (after 20 seconds).  Findings: Total distal RCA; total ostial circumflex, and 60-70% LM and 80% LAD.  Normal LV function.

## 2018-05-19 NOTE — Progress Notes (Signed)
ANTICOAGULATION CONSULT NOTE - Initial Consult  Pharmacy Consult for Heparin Indication: chest pain/ACS - NSTEMI  Allergies  Allergen Reactions  . Codeine Other (See Comments)    Reaction: Unsure  . Sulfa Antibiotics Other (See Comments)    Mouth blisters    Patient Measurements: Height: 5\' 1"  (154.9 cm) Weight: 134 lb 0.6 oz (60.8 kg) IBW/kg (Calculated) : 47.8 Heparin Dosing Weight: 60 kg  Vital Signs: Temp: 99.4 F (37.4 C) (10/14 1232) Temp Source: Oral (10/14 1232) BP: 128/72 (10/14 1300) Pulse Rate: 96 (10/14 1315)  Labs: Recent Labs    05/17/18 1031 05/17/18 1616  05/17/18 1648 05/17/18 2110 05/18/18 0617 05/19/18 1011  HGB 15.4* 15.0  --   --   --  13.9  --   HCT 46.2* 48.1*  --   --   --  43.0  --   PLT 239 245  --   --   --  211  --   LABPROT  --   --   --  12.9  --   --   --   INR  --   --   --  0.98  --   --   --   CREATININE 0.96 0.97  --   --   --  0.80 0.81  TROPONINI  --   --    < >  --  0.03* 0.48* 7.50*   < > = values in this interval not displayed.    Estimated Creatinine Clearance: 50.2 mL/min (by C-G formula based on SCr of 0.81 mg/dL).   Medical History: Past Medical History:  Diagnosis Date  . AAA (abdominal aortic aneurysm) (HCC)   . Hypertension   . Peripheral vascular disease (HCC)     Medications:  Scheduled:  . amLODipine  10 mg Oral Daily  . [START ON 05/20/2018] aspirin  81 mg Oral Pre-Cath  . docusate sodium  100 mg Oral Daily  . famotidine  20 mg Oral BID  . mouth rinse  15 mL Mouth Rinse BID  . metoprolol tartrate  50 mg Oral BID  . oxyCODONE  10 mg Oral Q12H  . polyethylene glycol  17 g Oral Daily  . potassium citrate  10 mEq Oral BID WC  . rosuvastatin  20 mg Oral q1800  . sodium chloride flush  3 mL Intravenous Q12H    Assessment: Elizabeth [name] is a 41 YOF who was admitted on 10/12 with back pain found to have an aortic ulceration with intermural hematoma. Since her admission, repeat EKG has shown new ST  depression and troponins increased up to 7.5, deemed to be an NSTEMI. Patient planning to go to cath lab today.  Pharmacy has been consulted for heparin dosing. CBC is within normal limits.     Goal of Therapy:  Heparin level 0.3-0.7 units/ml Monitor platelets by anticoagulation protocol: Yes   Plan:  Start heparin infusion at 700 units/hr  Follow-up anticoagulation plan post-cath  Arvilla Market, PharmD PGY1 Pharmacy Resident Phone [phone] 05/19/2018     1:48 PM

## 2018-05-19 NOTE — Progress Notes (Signed)
ANTICOAGULATION CONSULT NOTE  Pharmacy Consult for Heparin Indication: chest pain/ACS - NSTEMI  Allergies  Allergen Reactions  . Codeine Other (See Comments)    Reaction: Unsure  . Sulfa Antibiotics Other (See Comments)    Mouth blisters    Patient Measurements: Height: 5\' 1"  (154.9 cm) Weight: 134 lb 0.6 oz (60.8 kg) IBW/kg (Calculated) : 47.8 Heparin Dosing Weight: 60 kg  Vital Signs: Temp: 99.4 F (37.4 C) (10/14 1232) Temp Source: Oral (10/14 1232) BP: 126/60 (10/14 1500) Pulse Rate: 98 (10/14 1515)  Labs: Recent Labs    05/17/18 1031 05/17/18 1616  05/17/18 1648 05/17/18 2110 05/18/18 0617 05/19/18 1011  HGB 15.4* 15.0  --   --   --  13.9  --   HCT 46.2* 48.1*  --   --   --  43.0  --   PLT 239 245  --   --   --  211  --   LABPROT  --   --   --  12.9  --   --   --   INR  --   --   --  0.98  --   --   --   CREATININE 0.96 0.97  --   --   --  0.80 0.81  TROPONINI  --   --    < >  --  0.03* 0.48* 7.50*   < > = values in this interval not displayed.    Estimated Creatinine Clearance: 50.2 mL/min (by C-G formula based on SCr of 0.81 mg/dL).   Medical History: Past Medical History:  Diagnosis Date  . AAA (abdominal aortic aneurysm) (HCC)   . Hypertension   . Peripheral vascular disease (HCC)     Medications:  Scheduled:  . [MAR Hold] amLODipine  10 mg Oral Daily  . [START ON 05/20/2018] aspirin  81 mg Oral Pre-Cath  . [MAR Hold] docusate sodium  100 mg Oral Daily  . [MAR Hold] famotidine  20 mg Oral BID  . [MAR Hold] mouth rinse  15 mL Mouth Rinse BID  . [MAR Hold] metoprolol tartrate  50 mg Oral BID  . [MAR Hold] oxyCODONE  10 mg Oral Q12H  . [MAR Hold] polyethylene glycol  17 g Oral Daily  . [MAR Hold] potassium citrate  10 mEq Oral BID WC  . [MAR Hold] rosuvastatin  20 mg Oral q1800  . sodium chloride flush  3 mL Intravenous Q12H    Assessment: Elizabeth [name] is a 50 YOF who was admitted on 10/12 with back pain found to have an aortic  ulceration with intermural hematoma. Since her admission, repeat EKG has shown new ST depression and troponins increased up to 7.5, deemed to be an NSTEMI.  Underwent cardiac cath today finding multivessel disease - now awaiting further cardiology work-up. Pharmacy consulted to restart heparin infusion 9 hours after sheath removal (documented on 10/14@1652 ). Last CBC WNL yesterday. No s/sx of bleeding documented.       Goal of Therapy:  Heparin level 0.3-0.7 units/ml Monitor platelets by anticoagulation protocol: Yes   Plan:  Restart heparin infusion at 700 units/hr on 10/15@0200  Obtain heparin level 8 hours after restart Monitor daily HL, CBC, and for s/sx of bleeding Follow-up for cardiology plan  Girard Cooter, PharmD Clinical Pharmacist  Pager: [phone] Phone: [phone] 05/19/2018     5:08 PM

## 2018-05-19 NOTE — Progress Notes (Addendum)
PULMONARY / CRITICAL CARE MEDICINE   NAME:  Elizabeth [name], MRN:  [id number], DOB:  03-06-1941, LOS: 2 ADMISSION DATE:  05/17/2018,, CHIEF COMPLAINT: Back pain  BRIEF HISTORY:    77 year old with severe peripheral vascular disease presenting with back pain radiating beneath both costal margins bilaterally found to have descending thoracic aorta ulcerations.  SIGNIFICANT PAST MEDICAL HISTORY   HTN, PVD, infrarenal AAA, bilateral carotid endarterectomy, stenting of the iliac artery.  SIGNIFICANT EVENTS:  10/12 Admit >>  STUDIES:   10/12 CTA chest/ abd/ pelvis >> 1. 5 mm proximal descending thoracic aorta and 12 mm distal descending thoracic aorta penetrating ulcers with adjacent intramural hematoma. No dissection. 2. Intermediate density thickening of the central pulmonary arteries could reflect a small amount of mediastinal hematoma versus possibly wall thickening related to underlying vasculitis. 3. 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 4. Interval enlargement of the infrarenal abdominal aortic aneurysm, now measuring 4.0 cm. Recommend followup by ultrasound in 1 year.  5. 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 Mild stenosis of the proximal left common carotid artery. 6. Mild stenosis of the celiac artery origin. Moderate stenosis of the SMA and left renal artery origins. 7. 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 8. Left common and external iliac stent with severe focal stenosis of the external iliac artery just distal to the stent.  Other: 1. Nonobstructive left nephrolithiasis. 2. Moderate fat containing left inguinal hernia.  10/14 Carotid duplex >>  CULTURES:  05/17/2018 MRSA PCR >> neg  ANTIBIOTICS:  None  LINES/TUBES:  PIV  CONSULTANTS:  10/12 Vascular Surgery   SUBJECTIVE:  cleviprex off since 2300 last night Some intermittent tachycardia  Patient without  complaint.  Reports mid back pain is better, now at a 4/ 10.  States she sat up in recliner for 2 hours this morning   CONSTITUTIONAL: BP (!) 112/54 (BP Location: Right Arm)   Pulse (!) 101   Temp 99.2 F (37.3 C) (Oral)   Resp 18   Ht 5\' 1"  (1.549 m)   Wt 60.8 kg   SpO2 94%   BMI 25.33 kg/m   I/O last 3 completed shifts: In: 2877.2 [P.O.:610; I.V.:2267.2] Out: 1350 [Urine:1350]    PHYSICAL EXAM: General:  Elderly female lying in bed in NAD HEENT: MM pink/moist Neuro: Alert, oriented, MAE CV:  rrr, no mumur, +1 bil DP/ warm  PULM: even/non-labored, lungs bilaterally clear, slightly diminished in bases GI: soft, non-tender, bs active  Extremities: warm/dry, no BLE edema  Skin: no rashes   RESOLVED PROBLEM LIST   ASSESSMENT AND PLAN   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  Bilateral vertebral artery flow is antegrade.  Pain management as below Renal panel today   Hypertension Urgency, PVD Tachycardia - UOP remains adequate  P:  PRN cleviprex gtt for goal SBP < 130 Continue norvasc 10 mg daily,  Will restart home metoprolol 50mg  BID to help with tachycardia, d/c labetalol 100 mg BID Prn lopressor  Daily  crestor  Holding asa, plavix    Chronic pain - mostly for left hip P:  Continued scheduled OxyContin 10 mg BID  norco prn  Tylenol prn   Elevated troponin w/ nonspecific twave changes P:  Repeat EKG and troponin now  Hx of nephrolithiasis P:  Will restart home potassium citrate as requested   SUMMARY OF TODAY'S  PLAN:  Ongoing medical managment  Best Practice / Goals of Care / Disposition.   DVT PROPHYLAXIS: SCDs/ SQ heparin  SUP: pepcid NUTRITION: heart healthy MOBILITY:  OOB to chair GOALS OF CARE:  Full  Patient updated on plan of care at bedside.  LABS  Glucose No results for input(s): GLUCAP in the last 168 hours.  BMET Recent Labs  Lab 05/17/18 1031 05/17/18 1616 05/18/18 0617  NA 141 138 139  K 5.6* 4.0 3.4*  CL 103 101 104  CO2 28 27 24   BUN 16 12 9   CREATININE 0.96 0.97 0.80  GLUCOSE 125* 125* 111*    Liver Enzymes Recent Labs  Lab 05/17/18 1031 05/18/18 0617  AST 40 28  ALT 16 15  ALKPHOS 86 62  BILITOT 1.1 0.9  ALBUMIN 4.6 3.6    Electrolytes Recent Labs  Lab 05/17/18 1031 05/17/18 1616 05/18/18 0617  CALCIUM 9.6 9.3 9.1  MG  --   --  1.8    CBC Recent Labs  Lab 05/17/18 1031 05/17/18 1616 05/18/18 0617  WBC 11.2* 10.1 6.9  HGB 15.4* 15.0 13.9  HCT 46.2* 48.1* 43.0  PLT 239 245 211    ABG No results for input(s): PHART, PCO2ART, PO2ART in the last 168 hours.  Coag's Recent Labs  Lab 05/17/18 1648  INR 0.98    Sepsis Markers Recent Labs  Lab 05/17/18 1635  LATICACIDVEN 3.48*    Cardiac Enzymes Recent Labs  Lab 05/17/18 1621 05/17/18 2110 05/18/18 0617  TROPONINI <0.03 0.03* 0.48*   Posey Boyer, AGACNP-BC [facility] Pgr: [phone] or if no answer [phone] 05/19/2018, 10:04 AM  Attending Note:  77 year old female here with AAA without rupture who was hypertensive.  Patient was started on clevaprex for BP control given AAA.  On exam, she is alert and oriented but complains of chest pain.  I reviewed abdominal CT myself, AAA noted.  F/U troponin is 7.  EKG reviewed and patient is having worsening ST segment depression.  Cardiology consult called.  Maintain on beta blockers.  Continue to hold ASA and plavix as there is a concern for a leak but will defer that to cardiology once they see the patient.   Pain control via morphine as needed.  Titrate O2 for sat of 92-95%.  Hold in the ICU given clevaprex and concern for a NSTEMI.  PCCM will continue to follow.  The patient is critically ill with multiple organ systems failure and requires high complexity decision making for assessment and support, frequent evaluation and titration of therapies, application of advanced monitoring technologies and extensive interpretation of multiple databases.   Critical Care Time devoted to patient care services described in this note is  33  Minutes. This time reflects time of care of this signee Dr Koren Bound. This critical care time does not reflect procedure time, or teaching time or supervisory time of PA/NP/Med student/Med Resident etc but could involve care discussion time.  [name], M.D. [facility]. Pager: [phone]. After hours pager: [phone].

## 2018-05-19 NOTE — H&P (View-Only) (Signed)
Cardiology Consultation:   Patient ID: Elizabeth [name] MRN: [id number]; DOB: Sep 29, 1940  Admit date: 05/17/2018 Date of Consult: 05/19/2018  Primary Care Provider: Kari Baars, MD Primary Cardiologist: New Dr. Allyson Sabal  Patient Profile:   Elizabeth [name] is a 77 y.o. female with a hx of peripheral vascular disease status post left common iliac artery stenting followed by [place] vascular vein, bilateral carotid endarterectomy, hypertension and abdominal aortic aneurysm who is being seen today for the evaluation of non-STEMI at the request of  Dr. Wallace Cullens.  History of Present Illness:   Elizabeth [name] presented to [facility] ER  for acute onset back pain radiating upper abdominal bilaterally up to her chest. 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  Transferred to [facility] for further treatment and evaluation under critical care team service.  Patient was seen by Dr. Randie Heinz. BP stable.  Plan for repeat scan tomorrow.  However, patient continued to have ongoing back pain.  Repeat EKG this morning showed new anterior lateral ST depression-personally reviewed. Troponin 0.03>>0.48>>7.5.  cardiology  is asked for management of non-STEMI.  Denies prior history of MI or CAD.  She had a normal stress test in 2013 prior to her iliac artery stent.  Remote tobacco smoking quit in 1992.  Mostly sedentary lifestyle due to chronic hip pain.  However felt stable candidate for surgery.  She denies any exertional chest pain prior to admission.  She has a chronic intermittent dyspnea on exertion.  CT Angio Chest/Abd/Pel for Dissection W and/or Wo Contrast 05/17/18  IMPRESSION: Vascular:  1. 5 mm proximal descending thoracic aorta and 12 mm distal descending thoracic aorta penetrating ulcers with adjacent intramural hematoma. No dissection. 2. Intermediate density thickening of the central pulmonary arteries could reflect a small amount of  mediastinal hematoma versus possibly wall thickening related to underlying vasculitis. 3. 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 4. Interval enlargement of the infrarenal abdominal aortic aneurysm, now measuring 4.0 cm. Recommend followup by ultrasound in 1 year. This recommendation follows ACR consensus guidelines: White Paper of the ACR Incidental Findings Committee II on Vascular Findings. J Am Coll Radiol 2013; 10:789-794. 5. 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 Mild stenosis of the proximal left common carotid artery. 6. Mild stenosis of the celiac artery origin. Moderate stenosis of the SMA and left renal artery origins. 7. 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 8. Left common and external iliac stent with severe focal stenosis of the external iliac artery just distal to the stent.  Other:  1. Nonobstructive left nephrolithiasis. 2. Moderate fat containing left inguinal hernia.  Past Medical History:  Diagnosis Date  . AAA (abdominal aortic aneurysm) (HCC)   . Hypertension   . Peripheral vascular disease [facility])     Past Surgical History:  Procedure Laterality Date  . APPENDECTOMY    . PERIPHERAL VASCULAR CATHETERIZATION Right 02/02/2016   Procedure: Lower Extremity Angiography;  Surgeon: Annice Needy, MD;  Location: [facility] INVASIVE CV LAB;  Service: Cardiovascular;  Laterality: Right;    Inpatient Medications: Scheduled Meds: . amLODipine  10 mg Oral Daily  . docusate sodium  100 mg Oral Daily  . famotidine  20 mg Oral BID  . heparin  5,000 Units Subcutaneous Q8H  . mouth rinse  15 mL Mouth Rinse BID  . metoprolol tartrate  50 mg Oral BID  . oxyCODONE  10 mg Oral Q12H  . polyethylene glycol  17 g Oral Daily  . potassium citrate  10 mEq Oral BID WC  . rosuvastatin  20 mg Oral q1800   Continuous Infusions: . clevidipine Stopped (05/18/18 2354)  . lactated  ringers 10 mL/hr at 05/19/18 1200   PRN Meds: acetaminophen, fentaNYL (SUBLIMAZE) injection, HYDROcodone-acetaminophen, metoprolol tartrate, ondansetron (ZOFRAN) IV  Allergies:    Allergies  Allergen Reactions  . Codeine Other (See Comments)    Reaction: Unsure  . Sulfa Antibiotics Other (See Comments)    Mouth blisters    Social History:   Social History   Socioeconomic History  . Marital status: Widowed    Spouse name: Not on file  . Number of children: Not on file  . Years of education: Not on file  . Highest education level: Not on file  Occupational History  . Not on file  Social Needs  . Financial resource strain: Not on file  . Food insecurity:    Worry: Not on file    Inability: Not on file  . Transportation needs:    Medical: Not on file    Non-medical: Not on file  Tobacco Use  . Smoking status: Former Smoker    Last attempt to quit: 02/02/1999    Years since quitting: 19.3  . Smokeless tobacco: Never Used  Substance and Sexual Activity  . Alcohol use: No  . Drug use: No  . Sexual activity: Not Currently  Lifestyle  . Physical activity:    Days per week: Not on file    Minutes per session: Not on file  . Stress: Not on file  Relationships  . Social connections:    Talks on phone: Not on file    Gets together: Not on file    Attends religious service: Not on file    Active member of club or organization: Not on file    Attends meetings of clubs or organizations: Not on file    Relationship status: Not on file  . Intimate partner violence:    Fear of current or ex partner: Not on file    Emotionally abused: Not on file    Physically abused: Not on file    Forced sexual activity: Not on file  Other Topics Concern  . Not on file  Social History Narrative  . Not on file    Family History:   Family History  Problem Relation Age of Onset  . Hypertension Mother   . Stroke Mother   . Hypertension Father   . Stroke Father      ROS:  Please see  the history of present illness.  All other ROS reviewed and negative.     Physical Exam/Data:   Vitals:   05/19/18 1145 05/19/18 1148 05/19/18 1200 05/19/18 1215  BP:  130/61 (!) 129/58   Pulse: 99 96 93 (!) 108  Resp: (!) 22  14 16   Temp:      TempSrc:      SpO2: 97%  98% 98%  Weight:      Height:        Intake/Output Summary (Last 24 hours) at 05/19/2018 1227 Last data filed at 05/19/2018 1200 Gross per 24 hour  Intake 982.3 ml  Output 1400 ml  Net -417.7 ml   Filed Weights   05/17/18 1723 05/18/18 0600 05/19/18 0530  Weight: 64.4 kg 63.6 kg 60.8 kg   Body mass index is 25.33 kg/m.  General: Thin frail female in no  acute distress HEENT: normal Lymph: no adenopathy Neck: no JVD Endocrine:  No thryomegaly Vascular: No carotid bruits; FA pulses 2+ bilaterally without bruits  Cardiac:  normal S1, S2; RRR; no murmur  Lungs:  clear to auscultation bilaterally, no wheezing, rhonchi or rales  Abd: soft, nontender, no hepatomegaly  Ext: no edema Musculoskeletal:  No deformities, BUE and BLE strength normal and equal Skin: warm and dry  Neuro:  CNs 2-12 intact, no focal abnormalities noted Psych:  Normal affect   Telemetry:  Telemetry was personally reviewed and demonstrates: Sinus rhythm at rate of 90s  Relevant CV Studies: As above   Laboratory Data:  Chemistry Recent Labs  Lab 05/17/18 1616 05/18/18 0617 05/19/18 1011  NA 138 139 139  K 4.0 3.4* 4.1  CL 101 104 104  CO2 27 24 27   GLUCOSE 125* 111* 112*  BUN 12 9 12   CREATININE 0.97 0.80 0.81  CALCIUM 9.3 9.1 8.9  GFRNONAA 56* >60 >60  GFRAA >60 >60 >60  ANIONGAP 10 11 8     Recent Labs  Lab 05/17/18 1031 05/18/18 0617 05/19/18 1011  PROT 8.0 7.0  --   ALBUMIN 4.6 3.6 3.3*  AST 40 28  --   ALT 16 15  --   ALKPHOS 86 62  --   BILITOT 1.1 0.9  --    Hematology Recent Labs  Lab 05/17/18 1031 05/17/18 1616 05/18/18 0617  WBC 11.2* 10.1 6.9  RBC 4.54 4.64 4.21  HGB 15.4* 15.0 13.9  HCT  46.2* 48.1* 43.0  MCV 101.8* 103.7* 102.1*  MCH 33.9 32.3 33.0  MCHC 33.3 31.2 32.3  RDW 12.2 12.3 12.6  PLT 239 245 211   Cardiac Enzymes Recent Labs  Lab 05/17/18 1621 05/17/18 2110 05/18/18 0617 05/19/18 1011  TROPONINI <0.03 0.03* 0.48* 7.50*    Recent Labs  Lab 05/17/18 1631  TROPIPOC 0.00    Radiology/Studies:  Ct Angio Chest/abd/pel For Dissection W And/or Wo Contrast  Result Date: 05/17/2018 CLINICAL DATA:  Right-sided back pain radiating around to the right upper abdomen. History of aortic aneurysm. EXAM: CT ANGIOGRAPHY CHEST, ABDOMEN AND PELVIS TECHNIQUE: Multidetector CT imaging through the chest, abdomen and pelvis was performed using the standard protocol during bolus administration of intravenous contrast. Multiplanar reconstructed images and MIPs were obtained and reviewed to evaluate the vascular anatomy. CONTRAST:  75mL OMNIPAQUE IOHEXOL 350 MG/ML SOLN COMPARISON:  CT abdomen pelvis dated Jan 01, 2011. FINDINGS: CTA CHEST FINDINGS Cardiovascular: There is crescentic high density along the left aspect of the distal descending thoracic aorta adjacent to a 12 mm penetrating ulcer. Additional crescentic high density along the left aspect of the proximal descending aorta with smaller 5 mm penetrating ulcer along the medial proximal descending aorta just beyond the arch (series 8, image 77). No definite S section. 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 Coronary, aortic arch, and branch vessel atherosclerotic vascular disease. Mild stenosis of the proximal left common carotid artery. 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 Intermediate density thickening along the central pulmonary arteries. Normal heart size. No pericardial effusion. No central pulmonary embolism. Mediastinum/Nodes: No enlarged mediastinal, hilar, or axillary lymph nodes. Thyroid gland, trachea, and esophagus demonstrate no significant findings. Lungs/Pleura: No focal  consolidation, pleural effusion, or pneumothorax. Calcified granuloma in the left upper lobe. No suspicious pulmonary nodule. Musculoskeletal: No chest wall abnormality. No acute or significant osseous findings. Review of the MIP images confirms the above findings. CTA ABDOMEN AND PELVIS  FINDINGS VASCULAR Aorta: Interval enlargement of the infrarenal abdominal aortic aneurysm, now measuring 4.0 cm, previously 2.8 cm. Extensive atherosclerotic calcification. No dissection or significant stenosis. Celiac: Mild stenosis of the origin due to calcific atherosclerosis. No aneurysm, dissection, or vasculitis. SMA: Moderate stenosis of the origin due to calcific atherosclerosis. No aneurysm, dissection, or vasculitis. Renals: Severe stenosis of the right renal artery origin with diminutive right renal artery. Moderate stenosis of the left renal artery origin due to calcific atherosclerosis. No aneurysm, dissection, or vasculitis. IMA: Patent without evidence of aneurysm, dissection, vasculitis or significant stenosis. Inflow: Left common and external iliac stent with severe focal stenosis at the distal stent. No aneurysm, dissection, or vasculitis. Veins: No obvious venous abnormality within the limitations of this arterial phase study. Review of the MIP images confirms the above findings. NON-VASCULAR Hepatobiliary: No focal liver abnormality is seen. No gallstones, gallbladder wall thickening, or biliary dilatation. Pancreas: Unremarkable. No pancreatic ductal dilatation or surrounding inflammatory changes. Spleen: Normal in size without focal abnormality. Adrenals/Urinary Tract: The adrenal glands are unremarkable. Progressive now severe atrophy of the right kidney. Small nonobstructive left renal calculi. Small cyst in the left lower pole. No hydronephrosis. The bladder is decompressed. Stomach/Bowel: Stomach is within normal limits. Appendix is surgically absent. No evidence of bowel wall thickening, distention, or  inflammatory changes. Moderate sigmoid diverticulosis. Lymphatic: No enlarged abdominal or pelvic lymph nodes. Reproductive: Uterus and bilateral adnexa are unremarkable. Other: Unchanged moderate fat containing left inguinal hernia. Probable small focal area of calcified fat necrosis in the inferior aspect of the hernia is unchanged. No free fluid or pneumoperitoneum. Musculoskeletal: No acute or significant osseous findings. Atrophy of the left gluteus muscles. Unchanged severe left hip osteoarthritis with deformity of the femoral head and acetabulum. Review of the MIP images confirms the above findings. IMPRESSION: Vascular: 1. 5 mm proximal descending thoracic aorta and 12 mm distal descending thoracic aorta penetrating ulcers with adjacent intramural hematoma. No dissection. 2. Intermediate density thickening of the central pulmonary arteries could reflect a small amount of mediastinal hematoma versus possibly wall thickening related to underlying vasculitis. 3. 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 4. Interval enlargement of the infrarenal abdominal aortic aneurysm, now measuring 4.0 cm. Recommend followup by ultrasound in 1 year. This recommendation follows ACR consensus guidelines: White Paper of the ACR Incidental Findings Committee II on Vascular Findings. J Am Coll Radiol 2013; 10:789-794. 5. 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 Mild stenosis of the proximal left common carotid artery. 6. Mild stenosis of the celiac artery origin. Moderate stenosis of the SMA and left renal artery origins. 7. 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 8. Left common and external iliac stent with severe focal stenosis of the external iliac artery just distal to the stent. Other: 1. Nonobstructive left nephrolithiasis. 2. Moderate fat containing left inguinal hernia. These results were called by telephone at the time of  interpretation on 05/17/2018 at 1:06 pm to Dr. Phineas Semen , who verbally acknowledged these results. Electronically Signed   By: Obie Dredge M.D.   On: 05/17/2018 13:09    Assessment and Plan:   1. Non-STEMI in setting of acute ulceration of thoracic aorta -Troponin peaked to 7.  EKG with new anterior lateral ST depression.  Ongoing back pain radiating to upper abdomen and then chest.  No exertional chest pain/pressure prior to presentation. - The patient understands that risks include but  are not limited to stroke (1 in 1000), death (1 in 1000), kidney failure [usually temporary] (1 in 500), bleeding (1 in 200), allergic reaction [possibly serious] (1 in 200), and agrees to proceed.  - Start heparin without bolus (Ok to start, Dr. Allyson Sabal discussed with Dr. Randie Heinz).    2. Acute ulceration with intermural hematoma of the thoracic aorta. -Continue to have ongoing pain despite better blood pressure.  Plan to repeat scan tomorrow.  3.  Carotid artery disease status post bilateral endarterectomy  -Pending carotid Doppler reading.  Preliminary report: Right CEA is patent. 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  - On Plavix  4.  Peripheral vascular disease s/p left common iliac artery stenting  5. HTN - BP controlled on cleviprex, amlodipine and metoprolol   4. AAA - Slightly larger than prior, now at 4cm.    For questions or updates, please contact [facility] Please consult [email or web address] for contact info under     Lorelei Pont, PA  05/19/2018 12:27 PM   Agree with note by Chelsea Aus PA-C  We are asked to see Shawnie Dapper for non-STEMI.  She is a patient of Dr. Darcella Cheshire, vascular surgery.  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  Other problems as outlined.  She developed back pain on Saturday and presented to [facility]  regional.  Her initial enzymes were low but they rose to 7.  She continues to have intermittent back pain with lateral ST segment depression.  Based on this, I feel she would require cardiac catheterization today.  I have spoken to Dr. Randie Heinz who says that she can be anticoagulated as necessary.  Her exam is benign.  She has a diminished right radial pulse and a good right common femoral pulse.  Discussed the case with Dr. Katrinka Blazing as well, cardiologist he will be performing a cardiac catheterization.  Runell Gess, M.D., FACP, [facility], [facility] [place] [facility] [address] [place], [place]  [address]  [phone] 05/19/2018 1:25 PM

## 2018-05-19 NOTE — Progress Notes (Addendum)
Day of Surgery Procedure(s) (LRB): LEFT HEART CATH AND CORONARY ANGIOGRAPHY (N/A) Subjective: Patient examined, recent images of coronary angiogram and CTA of thoracic aorta personally reviewed and counseled with patient and family.  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  She was placed in ICU for observation and blood pressure monitoring.  She had no neurologic deficit.  She has been carefully followed by Dr. Randie Heinz.  She developed more chest and back pain over the past 24 hours with a rising troponin consistent with non-STEMI.  She was added today Cath Lab schedule today for left heart cath via right ulnar artery.  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  There is a 60% left main stenosis and a 80% proximal LAD stenosis.  LVEDP appeared to be normal.  LVEF 50%. Patient is comfortable post cath with stable vital signs, no chest pain.  She is still having some back pain  The patient denies venous disease of lower extremities, DVT or varicose veins.  On exam her saphenous vein appears to be small.  Patient has a heavy smoking history.  CTA of the chest pulmonary windows show moderate changes of COPD.  The patient has poor mobility because of 3 operations on her left hip.  She uses a walker-cane at home..  Objective: Vital signs in last 24 hours: Temp:  [98.8 F (37.1 C)-99.4 F (37.4 C)] 99.4 F (37.4 C) (10/14 1232) Pulse Rate:  [80-122] 105 (10/14 1900) Cardiac Rhythm: Sinus tachycardia (10/14 1200) Resp:  [12-35] 14 (10/14 1900) BP: (96-157)/(45-101) 153/74 (10/14 1900) SpO2:  [93 %-100  %] 95 % (10/14 1900) Weight:  [60.8 kg] 60.8 kg (10/14 0530)  Hemodynamic parameters for last 24 hours:  Sinus rhythm  Intake/Output from previous day: 10/13 0701 - 10/14 0700 In: 1555 [P.O.:550; I.V.:1005] Out: 750 [Urine:750] Intake/Output this shift: No intake/output data recorded.       Exam    General-elderly fragile appearing female no acute distress accompanied by family    Neck- no JVD, no cervical adenopathy palpable, no carotid bruit.  Bilateral carotid incisions.   Lungs-equal breath sounds with scattered rhonchi   Cor- regular rate and rhythm, no murmur , gallop   Abdomen- soft, non-tender   Extremities - warm, non-tender, minimal edema   Neuro- oriented, appropriate, no focal weakness  Lab Results: Recent Labs    05/17/18 1616 05/18/18 0617  WBC 10.1 6.9  HGB 15.0 13.9  HCT 48.1* 43.0  PLT 245 211   BMET:  Recent Labs    05/18/18 0617 05/19/18 1011  NA 139 139  K 3.4* 4.1  CL 104 104  CO2 24 27  GLUCOSE 111* 112*  BUN 9 12  CREATININE 0.80 0.81  CALCIUM 9.1 8.9    PT/INR:  Recent Labs    05/17/18 1648  LABPROT 12.9  INR 0.98   ABG No results found for: PHART, HCO3, TCO2, ACIDBASEDEF, O2SAT CBG (last 3)  No results for input(s): GLUCAP in the last 72 hours.  Assessment/Plan: S/P Procedure(s) (LRB): LEFT HEART CATH AND CORONARY ANGIOGRAPHY (N/A) Patient would be high risk for CABG, cardiopulmonary bypass, sternotomy with recent 2 sites of penetrating ulcer  in her descending thoracic aorta.  High-dose heparin and cardiac coronary bypass would definitely be a risk for  turning the ulcerated areas into a full dissection.  She appears to have poor mechanics of breathing as well and has not had PFTs with her heavy smoking history.  Additionally the patient is very adverse to having sternotomy and CABG after having seen 1 of her family members go through the procedure.  She does not think she would do well.  I have ordered echocardiogram and  vein mapping of her saphenous vein.  However, the patient is very set on discussing PCI approach for her coronary disease. Will follow.  LOS: 2 days    Kathlee Nations Trigt III 05/19/2018

## 2018-05-19 NOTE — Consult Note (Addendum)
Cardiology Consultation:   Patient ID: Elizabeth [name] MRN: [id number]; DOB: 05/28/1941  Admit date: 05/17/2018 Date of Consult: 05/19/2018  Primary Care Provider: Hawkins, Edward, MD Primary Cardiologist: New Dr. Ourania Hamler  Patient Profile:   Elizabeth [name] is a 77 y.o. female with a hx of peripheral vascular disease status post left common iliac artery stenting followed by Maceo vascular vein, bilateral carotid endarterectomy, hypertension and abdominal aortic aneurysm who is being seen today for the evaluation of non-STEMI at the request of  Dr. Gray.  History of Present Illness:   Elizabeth [name] presented to [facility] ER  for acute onset back pain radiating upper abdominal bilaterally up to her chest. 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  Transferred to [place] [facility] for further treatment and evaluation under critical care team service.  Patient was seen by Dr. Cain. BP stable.  Plan for repeat scan tomorrow.  However, patient continued to have ongoing back pain.  Repeat EKG this morning showed new anterior lateral ST depression-personally reviewed. Troponin 0.03>>0.48>>7.5.  cardiology  is asked for management of non-STEMI.  Denies prior history of MI or CAD.  She had a normal stress test in 2013 prior to her iliac artery stent.  Remote tobacco smoking quit in 1992.  Mostly sedentary lifestyle due to chronic hip pain.  However felt stable candidate for surgery.  She denies any exertional chest pain prior to admission.  She has a chronic intermittent dyspnea on exertion.  CT Angio Chest/Abd/Pel for Dissection W and/or Wo Contrast 05/17/18  IMPRESSION: Vascular:  1. 5 mm proximal descending thoracic aorta and 12 mm distal descending thoracic aorta penetrating ulcers with adjacent intramural hematoma. No dissection. 2. Intermediate density thickening of the central pulmonary arteries could reflect a small amount of  mediastinal hematoma versus possibly wall thickening related to underlying vasculitis. 3. 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 4. Interval enlargement of the infrarenal abdominal aortic aneurysm, now measuring 4.0 cm. Recommend followup by ultrasound in 1 year. This recommendation follows ACR consensus guidelines: White Paper of the ACR Incidental Findings Committee II on Vascular Findings. J Am Coll Radiol 2013; 10:789-794. 5. 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 Mild stenosis of the proximal left common carotid artery. 6. Mild stenosis of the celiac artery origin. Moderate stenosis of the SMA and left renal artery origins. 7. 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 8. Left common and external iliac stent with severe focal stenosis of the external iliac artery just distal to the stent.  Other:  1. Nonobstructive left nephrolithiasis. 2. Moderate fat containing left inguinal hernia.  Past Medical History:  Diagnosis Date  . AAA (abdominal aortic aneurysm) (HCC)   . Hypertension   . Peripheral vascular disease (HCC)     Past Surgical History:  Procedure Laterality Date  . APPENDECTOMY    . PERIPHERAL VASCULAR CATHETERIZATION Right 02/02/2016   Procedure: Lower Extremity Angiography;  Surgeon: Jason S Dew, MD;  Location: [facility] INVASIVE CV LAB;  Service: Cardiovascular;  Laterality: Right;    Inpatient Medications: Scheduled Meds: . amLODipine  10 mg Oral Daily  . docusate sodium  100 mg Oral Daily  . famotidine  20 mg Oral BID  . heparin  5,000 Units Subcutaneous Q8H  . mouth rinse  15 mL Mouth Rinse BID  . metoprolol tartrate  50 mg Oral BID  . oxyCODONE    10 mg Oral Q12H  . polyethylene glycol  17 g Oral Daily  . potassium citrate  10 mEq Oral BID WC  . rosuvastatin  20 mg Oral q1800   Continuous Infusions: . clevidipine Stopped (05/18/18 2354)  . lactated  ringers 10 mL/hr at 05/19/18 1200   PRN Meds: acetaminophen, fentaNYL (SUBLIMAZE) injection, HYDROcodone-acetaminophen, metoprolol tartrate, ondansetron (ZOFRAN) IV  Allergies:    Allergies  Allergen Reactions  . Codeine Other (See Comments)    Reaction: Unsure  . Sulfa Antibiotics Other (See Comments)    Mouth blisters    Social History:   Social History   Socioeconomic History  . Marital status: Widowed    Spouse name: Not on file  . Number of children: Not on file  . Years of education: Not on file  . Highest education level: Not on file  Occupational History  . Not on file  Social Needs  . Financial resource strain: Not on file  . Food insecurity:    Worry: Not on file    Inability: Not on file  . Transportation needs:    Medical: Not on file    Non-medical: Not on file  Tobacco Use  . Smoking status: Former Smoker    Last attempt to quit: 02/02/1999    Years since quitting: 19.3  . Smokeless tobacco: Never Used  Substance and Sexual Activity  . Alcohol use: No  . Drug use: No  . Sexual activity: Not Currently  Lifestyle  . Physical activity:    Days per week: Not on file    Minutes per session: Not on file  . Stress: Not on file  Relationships  . Social connections:    Talks on phone: Not on file    Gets together: Not on file    Attends religious service: Not on file    Active member of club or organization: Not on file    Attends meetings of clubs or organizations: Not on file    Relationship status: Not on file  . Intimate partner violence:    Fear of current or ex partner: Not on file    Emotionally abused: Not on file    Physically abused: Not on file    Forced sexual activity: Not on file  Other Topics Concern  . Not on file  Social History Narrative  . Not on file    Family History:   Family History  Problem Relation Age of Onset  . Hypertension Mother   . Stroke Mother   . Hypertension Father   . Stroke Father      ROS:  Please see  the history of present illness.  All other ROS reviewed and negative.     Physical Exam/Data:   Vitals:   05/19/18 1145 05/19/18 1148 05/19/18 1200 05/19/18 1215  BP:  130/61 (!) 129/58   Pulse: 99 96 93 (!) 108  Resp: (!) 22  14 16  Temp:      TempSrc:      SpO2: 97%  98% 98%  Weight:      Height:        Intake/Output Summary (Last 24 hours) at 05/19/2018 1227 Last data filed at 05/19/2018 1200 Gross per 24 hour  Intake 982.3 ml  Output 1400 ml  Net -417.7 ml   Filed Weights   05/17/18 1723 05/18/18 0600 05/19/18 0530  Weight: 64.4 kg 63.6 kg 60.8 kg   Body mass index is 25.33 kg/m.  General: Thin frail female in no   acute distress HEENT: normal Lymph: no adenopathy Neck: no JVD Endocrine:  No thryomegaly Vascular: No carotid bruits; FA pulses 2+ bilaterally without bruits  Cardiac:  normal S1, S2; RRR; no murmur  Lungs:  clear to auscultation bilaterally, no wheezing, rhonchi or rales  Abd: soft, nontender, no hepatomegaly  Ext: no edema Musculoskeletal:  No deformities, BUE and BLE strength normal and equal Skin: warm and dry  Neuro:  CNs 2-12 intact, no focal abnormalities noted Psych:  Normal affect   Telemetry:  Telemetry was personally reviewed and demonstrates: Sinus rhythm at rate of 90s  Relevant CV Studies: As above   Laboratory Data:  Chemistry Recent Labs  Lab 05/17/18 1616 05/18/18 0617 05/19/18 1011  NA 138 139 139  K 4.0 3.4* 4.1  CL 101 104 104  CO2 27 24 27  GLUCOSE 125* 111* 112*  BUN 12 9 12  CREATININE 0.97 0.80 0.81  CALCIUM 9.3 9.1 8.9  GFRNONAA 56* >60 >60  GFRAA >60 >60 >60  ANIONGAP 10 11 8    Recent Labs  Lab 05/17/18 1031 05/18/18 0617 05/19/18 1011  PROT 8.0 7.0  --   ALBUMIN 4.6 3.6 3.3*  AST 40 28  --   ALT 16 15  --   ALKPHOS 86 62  --   BILITOT 1.1 0.9  --    Hematology Recent Labs  Lab 05/17/18 1031 05/17/18 1616 05/18/18 0617  WBC 11.2* 10.1 6.9  RBC 4.54 4.64 4.21  HGB 15.4* 15.0 13.9  HCT  46.2* 48.1* 43.0  MCV 101.8* 103.7* 102.1*  MCH 33.9 32.3 33.0  MCHC 33.3 31.2 32.3  RDW 12.2 12.3 12.6  PLT 239 245 211   Cardiac Enzymes Recent Labs  Lab 05/17/18 1621 05/17/18 2110 05/18/18 0617 05/19/18 1011  TROPONINI <0.03 0.03* 0.48* 7.50*    Recent Labs  Lab 05/17/18 1631  TROPIPOC 0.00    Radiology/Studies:  Ct Angio Chest/abd/pel For Dissection W And/or Wo Contrast  Result Date: 05/17/2018 CLINICAL DATA:  Right-sided back pain radiating around to the right upper abdomen. History of aortic aneurysm. EXAM: CT ANGIOGRAPHY CHEST, ABDOMEN AND PELVIS TECHNIQUE: Multidetector CT imaging through the chest, abdomen and pelvis was performed using the standard protocol during bolus administration of intravenous contrast. Multiplanar reconstructed images and MIPs were obtained and reviewed to evaluate the vascular anatomy. CONTRAST:  75mL OMNIPAQUE IOHEXOL 350 MG/ML SOLN COMPARISON:  CT abdomen pelvis dated Jan 01, 2011. FINDINGS: CTA CHEST FINDINGS Cardiovascular: There is crescentic high density along the left aspect of the distal descending thoracic aorta adjacent to a 12 mm penetrating ulcer. Additional crescentic high density along the left aspect of the proximal descending aorta with smaller 5 mm penetrating ulcer along the medial proximal descending aorta just beyond the arch (series 8, image 77). No definite S section. 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 Coronary, aortic arch, and branch vessel atherosclerotic vascular disease. Mild stenosis of the proximal left common carotid artery. 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 Intermediate density thickening along the central pulmonary arteries. Normal heart size. No pericardial effusion. No central pulmonary embolism. Mediastinum/Nodes: No enlarged mediastinal, hilar, or axillary lymph nodes. Thyroid gland, trachea, and esophagus demonstrate no significant findings. Lungs/Pleura: No focal  consolidation, pleural effusion, or pneumothorax. Calcified granuloma in the left upper lobe. No suspicious pulmonary nodule. Musculoskeletal: No chest wall abnormality. No acute or significant osseous findings. Review of the MIP images confirms the above findings. CTA ABDOMEN AND PELVIS   FINDINGS VASCULAR Aorta: Interval enlargement of the infrarenal abdominal aortic aneurysm, now measuring 4.0 cm, previously 2.8 cm. Extensive atherosclerotic calcification. No dissection or significant stenosis. Celiac: Mild stenosis of the origin due to calcific atherosclerosis. No aneurysm, dissection, or vasculitis. SMA: Moderate stenosis of the origin due to calcific atherosclerosis. No aneurysm, dissection, or vasculitis. Renals: Severe stenosis of the right renal artery origin with diminutive right renal artery. Moderate stenosis of the left renal artery origin due to calcific atherosclerosis. No aneurysm, dissection, or vasculitis. IMA: Patent without evidence of aneurysm, dissection, vasculitis or significant stenosis. Inflow: Left common and external iliac stent with severe focal stenosis at the distal stent. No aneurysm, dissection, or vasculitis. Veins: No obvious venous abnormality within the limitations of this arterial phase study. Review of the MIP images confirms the above findings. NON-VASCULAR Hepatobiliary: No focal liver abnormality is seen. No gallstones, gallbladder wall thickening, or biliary dilatation. Pancreas: Unremarkable. No pancreatic ductal dilatation or surrounding inflammatory changes. Spleen: Normal in size without focal abnormality. Adrenals/Urinary Tract: The adrenal glands are unremarkable. Progressive now severe atrophy of the right kidney. Small nonobstructive left renal calculi. Small cyst in the left lower pole. No hydronephrosis. The bladder is decompressed. Stomach/Bowel: Stomach is within normal limits. Appendix is surgically absent. No evidence of bowel wall thickening, distention, or  inflammatory changes. Moderate sigmoid diverticulosis. Lymphatic: No enlarged abdominal or pelvic lymph nodes. Reproductive: Uterus and bilateral adnexa are unremarkable. Other: Unchanged moderate fat containing left inguinal hernia. Probable small focal area of calcified fat necrosis in the inferior aspect of the hernia is unchanged. No free fluid or pneumoperitoneum. Musculoskeletal: No acute or significant osseous findings. Atrophy of the left gluteus muscles. Unchanged severe left hip osteoarthritis with deformity of the femoral head and acetabulum. Review of the MIP images confirms the above findings. IMPRESSION: Vascular: 1. 5 mm proximal descending thoracic aorta and 12 mm distal descending thoracic aorta penetrating ulcers with adjacent intramural hematoma. No dissection. 2. Intermediate density thickening of the central pulmonary arteries could reflect a small amount of mediastinal hematoma versus possibly wall thickening related to underlying vasculitis. 3. 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 4. Interval enlargement of the infrarenal abdominal aortic aneurysm, now measuring 4.0 cm. Recommend followup by ultrasound in 1 year. This recommendation follows ACR consensus guidelines: White Paper of the ACR Incidental Findings Committee II on Vascular Findings. J Am Coll Radiol 2013; 10:789-794. 5. 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 Mild stenosis of the proximal left common carotid artery. 6. Mild stenosis of the celiac artery origin. Moderate stenosis of the SMA and left renal artery origins. 7. 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 8. Left common and external iliac stent with severe focal stenosis of the external iliac artery just distal to the stent. Other: 1. Nonobstructive left nephrolithiasis. 2. Moderate fat containing left inguinal hernia. These results were called by telephone at the time of  interpretation on 05/17/2018 at 1:06 pm to Dr. GRAYDON GOODMAN , who verbally acknowledged these results. Electronically Signed   By: William T Derry M.D.   On: 05/17/2018 13:09    Assessment and Plan:   1. Non-STEMI in setting of acute ulceration of thoracic aorta -Troponin peaked to 7.  EKG with new anterior lateral ST depression.  Ongoing back pain radiating to upper abdomen and then chest.  No exertional chest pain/pressure prior to presentation. - The patient understands that risks include but   are not limited to stroke (1 in 1000), death (1 in 1000), kidney failure [usually temporary] (1 in 500), bleeding (1 in 200), allergic reaction [possibly serious] (1 in 200), and agrees to proceed.  - Start heparin without bolus (Ok to start, Dr. Emlyn Maves discussed with Dr. Cain).    2. Acute ulceration with intermural hematoma of the thoracic aorta. -Continue to have ongoing pain despite better blood pressure.  Plan to repeat scan tomorrow.  3.  Carotid artery disease status post bilateral endarterectomy  -Pending carotid Doppler reading.  Preliminary report: Right CEA is patent. 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  - On Plavix  4.  Peripheral vascular disease s/p left common iliac artery stenting  5. HTN - BP controlled on cleviprex, amlodipine and metoprolol   4. AAA - Slightly larger than prior, now at 4cm.    For questions or updates, please contact [facility] Please consult [email or web address] for contact info under     Signed, Bhavinkumar Bhagat, PA  05/19/2018 12:27 PM   Agree with note by Vin Bhagat PA-C  We are asked to see Elizabeth [name] for non-STEMI.  She is a patient of Dr. Cain's, vascular surgery.  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  Other problems as outlined.  She developed back pain on Saturday and presented to   regional.  Her initial enzymes were low but they rose to 7.  She continues to have intermittent back pain with lateral ST segment depression.  Based on this, I feel she would require cardiac catheterization today.  I have spoken to Dr. Cain who says that she can be anticoagulated as necessary.  Her exam is benign.  She has a diminished right radial pulse and a good right common femoral pulse.  Discussed the case with Dr. Smith as well, cardiologist he will be performing a cardiac catheterization.  Elizabeth [name] J. Ginamarie Banfield, M.D., FACP, FACC, FAHA, FSCAI [facility] [address] [place], [place]  [address]  [phone] 05/19/2018 1:25 PM  

## 2018-05-19 NOTE — Progress Notes (Signed)
  Progress Note    05/19/2018 10:54 AM * No surgery found *  Subjective: Still having pain rated at 4 out of 10 upper back radiating around to abdomen  Vitals:   05/19/18 0804 05/19/18 1027  BP:  (!) 126/101  Pulse:  (!) 108  Resp:    Temp: 99.2 F (37.3 C)   SpO2:      Physical Exam: Awake alert oriented Palpable right radial pulse there is no palpable left radial pulse Abdomen is soft nontender Palpable right femoral pulse no palpable left femoral pulse Palpable right posterior tibial pulse no palpable left posterior or dorsalis pedis pulses Feet are sensorimotor intact  CBC    Component Value Date/Time   WBC 6.9 05/18/2018 0617   RBC 4.21 05/18/2018 0617   HGB 13.9 05/18/2018 0617   HGB 15.3 10/02/2012 0608   HCT 43.0 05/18/2018 0617   HCT 44.6 10/02/2012 0608   PLT 211 05/18/2018 0617   PLT 262 10/02/2012 0608   MCV 102.1 (H) 05/18/2018 0617   MCV 101 (H) 10/02/2012 0608   MCH 33.0 05/18/2018 0617   MCHC 32.3 05/18/2018 0617   RDW 12.6 05/18/2018 0617   RDW 12.3 10/02/2012 0608   LYMPHSABS 1.6 10/02/2012 0608   MONOABS 0.6 10/02/2012 0608   EOSABS 0.0 10/02/2012 0608   BASOSABS 0.0 10/02/2012 0608    BMET    Component Value Date/Time   NA 139 05/18/2018 0617   NA 137 10/02/2012 0608   K 3.4 (L) 05/18/2018 0617   K 3.5 10/02/2012 0608   CL 104 05/18/2018 0617   CL 105 10/02/2012 0608   CO2 24 05/18/2018 0617   CO2 24 10/02/2012 0608   GLUCOSE 111 (H) 05/18/2018 0617   GLUCOSE 124 (H) 10/02/2012 0608   BUN 9 05/18/2018 0617   BUN 8 10/02/2012 0608   CREATININE 0.80 05/18/2018 0617   CREATININE 0.84 10/02/2012 0608   CALCIUM 9.1 05/18/2018 0617   CALCIUM 8.7 10/02/2012 0608   GFRNONAA >60 05/18/2018 0617   GFRNONAA >60 09/08/2012 1457   GFRAA >60 05/18/2018 0617   GFRAA >60 09/08/2012 1457    INR    Component Value Date/Time   INR 0.98 05/17/2018 1648   INR 1.0 10/02/2012 0608     Intake/Output Summary (Last 24 hours) at 05/19/2018  1054 Last data filed at 05/19/2018 0800 Gross per 24 hour  Intake 1285.42 ml  Output 790 ml  Net 495.42 ml   Carotid duplex demonstrates 89-99% left ICA stenosis  Assessment:  77 y.o. female is here with thoracic IMH that appears to be acute she has persistent pain despite better blood pressure management.  Plan: Plan to rescan tomorrow.  Brandon C. Randie Heinz, MD [facility] [place] Office: [phone] Pager: [phone]  05/19/2018 10:54 AM

## 2018-05-19 NOTE — Progress Notes (Signed)
CRITICAL VALUE ALERT  Critical Value: troponin 7.50  Date & Time Notied:  05/19/2018 & 1125  Provider Notified: Dr. Molli Knock  Orders Received/Actions taken: EKG completed, CARD notified

## 2018-05-19 NOTE — Progress Notes (Signed)
VASCULAR LAB PRELIMINARY  PRELIMINARY  PRELIMINARY  PRELIMINARY  Carotid duplex completed.    Preliminary report:  Right CEA is patent.  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  Bilateral vertebral artery flow is antegrade.   Colt Martelle, RVT 05/19/2018, 9:23 AM

## 2018-05-19 NOTE — Interval H&P Note (Signed)
Cath Lab Visit (complete for each Cath Lab visit)  Clinical Evaluation Leading to the Procedure:   ACS: Yes.    Non-ACS:    Anginal Classification: CCS III  Anti-ischemic medical therapy: No Therapy  Non-Invasive Test Results: No non-invasive testing performed  Prior CABG: No previous CABG      History and Physical Interval Note:  05/19/2018 4:03 PM  Elizabeth [name]  has presented today for surgery, with the diagnosis of ns  The various methods of treatment have been discussed with the patient and family. After consideration of risks, benefits and other options for treatment, the patient has consented to  Procedure(s): LEFT HEART CATH AND CORONARY ANGIOGRAPHY (N/A) as a surgical intervention .  The patient's history has been reviewed, patient examined, no change in status, stable for surgery.  I have reviewed the patient's chart and labs.  Questions were answered to the patient's satisfaction.     Lyn Records III

## 2018-05-20 ENCOUNTER — Encounter (HOSPITAL_COMMUNITY): Payer: Self-pay | Admitting: Interventional Cardiology

## 2018-05-20 ENCOUNTER — Inpatient Hospital Stay (HOSPITAL_COMMUNITY): Payer: Medicare Other

## 2018-05-20 ENCOUNTER — Other Ambulatory Visit: Payer: Self-pay | Admitting: *Deleted

## 2018-05-20 DIAGNOSIS — I5030 Unspecified diastolic (congestive) heart failure: Secondary | ICD-10-CM

## 2018-05-20 DIAGNOSIS — Z01810 Encounter for preprocedural cardiovascular examination: Secondary | ICD-10-CM

## 2018-05-20 DIAGNOSIS — I2510 Atherosclerotic heart disease of native coronary artery without angina pectoris: Secondary | ICD-10-CM

## 2018-05-20 DIAGNOSIS — I7101 Dissection of thoracic aorta: Principal | ICD-10-CM

## 2018-05-20 DIAGNOSIS — R0902 Hypoxemia: Secondary | ICD-10-CM

## 2018-05-20 DIAGNOSIS — I739 Peripheral vascular disease, unspecified: Secondary | ICD-10-CM

## 2018-05-20 LAB — CBC
HCT: 37 % (ref 36.0–46.0)
HEMOGLOBIN: 11.5 g/dL — AB (ref 12.0–15.0)
MCH: 32.6 pg (ref 26.0–34.0)
MCHC: 31.1 g/dL (ref 30.0–36.0)
MCV: 104.8 fL — ABNORMAL HIGH (ref 80.0–100.0)
PLATELETS: 166 10*3/uL (ref 150–400)
RBC: 3.53 MIL/uL — ABNORMAL LOW (ref 3.87–5.11)
RDW: 12.5 % (ref 11.5–15.5)
WBC: 4.2 10*3/uL (ref 4.0–10.5)
nRBC: 0 % (ref 0.0–0.2)

## 2018-05-20 LAB — RENAL FUNCTION PANEL
ALBUMIN: 2.6 g/dL — AB (ref 3.5–5.0)
Anion gap: 7 (ref 5–15)
BUN: 12 mg/dL (ref 8–23)
CALCIUM: 8.4 mg/dL — AB (ref 8.9–10.3)
CO2: 27 mmol/L (ref 22–32)
Chloride: 106 mmol/L (ref 98–111)
Creatinine, Ser: 0.85 mg/dL (ref 0.44–1.00)
GFR calc Af Amer: >60 mL/min (ref >60)
GFR calc non Af Amer: >60 mL/min (ref >60)
Glucose, Bld: 101 mg/dL — ABNORMAL HIGH (ref 70–99)
PHOSPHORUS: 3.4 mg/dL (ref 2.5–4.6)
POTASSIUM: 3.9 mmol/L (ref 3.5–5.1)
SODIUM: 140 mmol/L (ref 135–145)

## 2018-05-20 LAB — LIPID PANEL
CHOL/HDL RATIO: 3.5 ratio
Cholesterol: 99 mg/dL (ref 0–200)
HDL: 28 mg/dL — ABNORMAL LOW (ref >40)
LDL Cholesterol: 43 mg/dL (ref 0–99)
Triglycerides: 139 mg/dL (ref <150)
VLDL: 28 mg/dL (ref 0–40)

## 2018-05-20 LAB — SURGICAL PCR SCREEN
MRSA, PCR: NEGATIVE
Staphylococcus aureus: NEGATIVE

## 2018-05-20 LAB — ECHOCARDIOGRAM COMPLETE
Height: 61 in
Weight: 2151.69 oz

## 2018-05-20 LAB — HEPARIN LEVEL (UNFRACTIONATED): Heparin Unfractionated: <0.1 IU/mL — ABNORMAL LOW (ref 0.30–0.70)

## 2018-05-20 LAB — MAGNESIUM: MAGNESIUM: 1.9 mg/dL (ref 1.7–2.4)

## 2018-05-20 MED ORDER — ASPIRIN EC 81 MG PO TBEC
81.0000 mg | DELAYED_RELEASE_TABLET | Freq: Every day | ORAL | Status: DC
  Administered 2018-05-20 – 2018-05-28 (×9): 81 mg via ORAL
  Filled 2018-05-20 (×9): qty 1

## 2018-05-20 MED ORDER — NITROGLYCERIN IN D5W 200-5 MCG/ML-% IV SOLN
5.0000 ug/min | INTRAVENOUS | Status: DC
  Administered 2018-05-20: 5 ug/min via INTRAVENOUS
  Filled 2018-05-20: qty 250

## 2018-05-20 NOTE — Progress Notes (Signed)
  Progress Note    05/20/2018 2:36 PM 1 Day Post-Op  Subjective: Still having mild back pain  Vitals:   05/20/18 1300 05/20/18 1400  BP: 138/66 121/60  Pulse: 87 84  Resp: 14 (!) 21  Temp: 98.9 F (37.2 C)   SpO2: 96% 95%    Physical Exam: Awake alert oriented Nonlabored respirations Abdomen is soft and nontender Palpable femoral pulse on the right I cannot palpate on the left Palpable PT on the right I cannot palpate any tibials on the left Both feet are warm   CBC    Component Value Date/Time   WBC 4.2 05/20/2018 0249   RBC 3.53 (L) 05/20/2018 0249   HGB 11.5 (L) 05/20/2018 0249   HGB 15.3 10/02/2012 0608   HCT 37.0 05/20/2018 0249   HCT 44.6 10/02/2012 0608   PLT 166 05/20/2018 0249   PLT 262 10/02/2012 0608   MCV 104.8 (H) 05/20/2018 0249   MCV 101 (H) 10/02/2012 0608   MCH 32.6 05/20/2018 0249   MCHC 31.1 05/20/2018 0249   RDW 12.5 05/20/2018 0249   RDW 12.3 10/02/2012 0608   LYMPHSABS 1.6 10/02/2012 0608   MONOABS 0.6 10/02/2012 0608   EOSABS 0.0 10/02/2012 0608   BASOSABS 0.0 10/02/2012 0608    BMET    Component Value Date/Time   NA 140 05/20/2018 0249   NA 137 10/02/2012 0608   K 3.9 05/20/2018 0249   K 3.5 10/02/2012 0608   CL 106 05/20/2018 0249   CL 105 10/02/2012 0608   CO2 27 05/20/2018 0249   CO2 24 10/02/2012 0608   GLUCOSE 101 (H) 05/20/2018 0249   GLUCOSE 124 (H) 10/02/2012 0608   BUN 12 05/20/2018 0249   BUN 8 10/02/2012 0608   CREATININE 0.85 05/20/2018 0249   CREATININE 0.84 10/02/2012 0608   CALCIUM 8.4 (L) 05/20/2018 0249   CALCIUM 8.7 10/02/2012 0608   GFRNONAA >60 05/20/2018 0249   GFRNONAA >60 09/08/2012 1457   GFRAA >60 05/20/2018 0249   GFRAA >60 09/08/2012 1457    INR    Component Value Date/Time   INR 0.98 05/17/2018 1648   INR 1.0 10/02/2012 0608     Intake/Output Summary (Last 24 hours) at 05/20/2018 1436 Last data filed at 05/20/2018 1400 Gross per 24 hour  Intake 743.47 ml  Output 1000 ml  Net  -256.53 ml     Assessment/plan:  77 y.o. female is here with type B IMH with thoracic PA use and known small abdominal aortic aneurysm with high-grade stenosis left internal carotid artery and complicated by an STEMI since arrival.  We are planning for interval CT angios to evaluate her thoracic aorta however will wait on this given work-up for CABG as well as recent contrast load.  I discussed this with the patient and her family they are in agreement.   Elizabeth Eppolito C. Randie Heinz, MD [facility] [place] Office: [phone] Pager: [phone]  05/20/2018 2:36 PM

## 2018-05-20 NOTE — Plan of Care (Signed)
  Problem: Education: Goal: Knowledge of General Education information will improve Description Including pain rating scale, medication(s)/side effects and non-pharmacologic comfort measures Outcome: Progressing   

## 2018-05-20 NOTE — Progress Notes (Signed)
Bilateral lower extremity vein mapping completed. [employer], RVS 05/20/2018 12:58 PM

## 2018-05-20 NOTE — Progress Notes (Signed)
  Echocardiogram 2D Echocardiogram has been performed.  Elizabeth [name] 05/20/2018, 2:09 PM

## 2018-05-20 NOTE — Progress Notes (Signed)
ANTICOAGULATION CONSULT NOTE  Pharmacy Consult for Heparin Indication: chest pain/ACS - NSTEMI  Allergies  Allergen Reactions  . Codeine Other (See Comments)    Reaction: Unsure  . Sulfa Antibiotics Other (See Comments)    Mouth blisters    Patient Measurements: Height: 5\' 1"  (154.9 cm) Weight: 134 lb 7.7 oz (61 kg) IBW/kg (Calculated) : 47.8 Heparin Dosing Weight: 60 kg  Vital Signs: Temp: 98.8 F (37.1 C) (10/15 1655) Temp Source: Oral (10/15 1655) BP: 141/67 (10/15 1800) Pulse Rate: 85 (10/15 1800)  Labs: Recent Labs    05/17/18 2110 05/18/18 0617 05/19/18 1011 05/20/18 0249 05/20/18 1829  HGB  --  13.9  --  11.5*  --   HCT  --  43.0  --  37.0  --   PLT  --  211  --  166  --   HEPARINUNFRC  --   --   --   --  <0.10*  CREATININE  --  0.80 0.81 0.85  --   TROPONINI 0.03* 0.48* 7.50*  --   --     Estimated Creatinine Clearance: 47.9 mL/min (by C-G formula based on SCr of 0.85 mg/dL).   Medical History: Past Medical History:  Diagnosis Date  . AAA (abdominal aortic aneurysm) (HCC)   . Hypertension   . Peripheral vascular disease (HCC)     Medications:  Scheduled:  . amLODipine  10 mg Oral Daily  . aspirin EC  81 mg Oral Daily  . docusate sodium  100 mg Oral Daily  . famotidine  20 mg Oral BID  . mouth rinse  15 mL Mouth Rinse BID  . metoprolol tartrate  50 mg Oral BID  . oxyCODONE  10 mg Oral Q12H  . polyethylene glycol  17 g Oral Daily  . potassium citrate  10 mEq Oral BID WC  . rosuvastatin  20 mg Oral q1800  . sodium chloride flush  3 mL Intravenous Q12H    Assessment: Elizabeth [name] is a 36 YOF who was admitted on 10/12 with back pain found to have an aortic ulceration with intermural hematoma. Since her admission, repeat EKG has shown new ST depression and troponins increased up to 7.5, deemed to be an NSTEMI.  Underwent cardiac cath finding multivessel disease - now awaiting further cardiology work-up. Heparin infusion running on L side,  heparin level able to be drawn from R side after 6 pm (24 hours from cath). Level came back undetectable - no infusion issues or signs of infiltration. No s/sx of bleeding.     Of note, CT angio showed 2 penetrating ulcers with adjacent intramural hematoma (no dissection) - so no bolus and will reduce goal range.      Goal of Therapy:  Heparin level 0.3-0.5 units/ml Monitor platelets by anticoagulation protocol: Yes   Plan:  Increase heparin infusion to 800 units/hr Obtain heparin level 8 hours after rate change Monitor daily HL, CBC, and for s/sx of bleeding Follow-up for cardiology plan  Girard Cooter, PharmD Clinical Pharmacist  Pager: [phone] Phone: [phone] 05/20/2018     8:53 PM

## 2018-05-20 NOTE — Progress Notes (Addendum)
PULMONARY / CRITICAL CARE MEDICINE   NAME:  Elizabeth [name], MRN:  [id number], DOB:  1940/10/12, LOS: 3 ADMISSION DATE:  05/17/2018,, CHIEF COMPLAINT: Back pain  BRIEF HISTORY:    77 year old with severe peripheral vascular disease presenting with back pain radiating beneath both costal margins bilaterally found to have descending thoracic aorta ulcerations.  SIGNIFICANT PAST MEDICAL HISTORY   HTN, PVD, infrarenal AAA, bilateral carotid endarterectomy, stenting of the iliac artery.  SIGNIFICANT EVENTS:  10/12 Admit >> 10/14 NSTEMI >> LHC  STUDIES:   10/12 CTA chest/ abd/ pelvis >> 1. 5 mm proximal descending thoracic aorta and 12 mm distal descending thoracic aorta penetrating ulcers with adjacent intramural hematoma. No dissection. 2. Intermediate density thickening of the central pulmonary arteries could reflect a small amount of mediastinal hematoma versus possibly wall thickening related to underlying vasculitis. 3. 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 4. Interval enlargement of the infrarenal abdominal aortic aneurysm, now measuring 4.0 cm. Recommend followup by ultrasound in 1 year.  5. 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 Mild stenosis of the proximal left common carotid artery. 6. Mild stenosis of the celiac artery origin. Moderate stenosis of the SMA and left renal artery origins. 7. 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 8. Left common and external iliac stent with severe focal stenosis of the external iliac artery just distal to the stent. Other: 1. Nonobstructive left nephrolithiasis. 2. Moderate fat containing left inguinal hernia.  10/14 Carotid duplex >>  10/14 LHC >> - total occlusion of the mid to distal RCA.  Distal RCA fills via well-formed collaterals around the left ventricular apex from the LAD. - Total occlusion of a relatively small circumflex.  Minimal  collaterals are noted. - Ostial 60% left main with pressure damping noted during engagement.  At least 60% distal left main is noted. - Heavily calcified 80% proximal LAD followed by eccentric calcified slitlike 70% mid LAD stenosis. - Normal LV function with EF 55%  TTE >> Saphenous vein mapping >>  CULTURES:  05/17/2018 MRSA PCR >> neg  ANTIBIOTICS:  None  LINES/TUBES:  PIV  CONSULTANTS:  10/12 Vascular Surgery  10/14 Cardiology 10/14 TCTS  SUBJECTIVE:  Patient reports improved pain, currently 2/10 mid back.  Ongoing emotional distress over events from yesterday and now faced with only option of high risk surgery  CONSTITUTIONAL: BP 125/60   Pulse 73   Temp 98.2 F (36.8 C) (Oral)   Resp 14   Ht 5\' 1"  (1.549 m)   Wt 61 kg   SpO2 97%   BMI 25.41 kg/m   I/O last 3 completed shifts: In: 853.3 [P.O.:200; I.V.:653.3] Out: 1980 [Urine:1980]    PHYSICAL EXAM: General:  Frail elderly female lying in bed, appears worried but NAD HEENT: MM pink/moist, pupils 3/reactive, no JVD Neuro:  Alert, oriented, non focal  CV: ST 101, no murmur +1 distal pulses PULM: even/non-labored, lungs bilaterally clear anteriorly, no wheeze, diminished bases  GI: soft, non-tender, bs + Extremities: warm/dry, no LE edema  Skin: no rashes   RESOLVED PROBLEM LIST   ASSESSMENT AND PLAN   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 with one on proximal descending thoracic aorta felt by vascular to be cause of her IMH P:  Vascular surgery following, to be determined if repair could done with CABG Ongoing monitoring for dissection given heparin gtt Strict BP control   NSTEMI -  LHC 10/15 with total occlusions of circ and RCA, with single remaining conduit to supply LAD - PCI not an option per Cardiology 10/15 - TCTS evaluated 10/14 and will be very high risk P:  Heparin gtt per pharmacy  TTE and vein mapping of saphenous vein for today  Cards and TCTS  following Daily ASA   Hypertension Urgency, PVD Tachycardia P:  PRN cleviprex gtt for goal SBP < 130 Continue norvasc 10 mg daily,  continue metoprolol 50mg  BID Prn lopressor  Daily crestor    Chronic Left hip pain  P:  OxyContin 10 mg BID  PRN norco prn  Tylenol prn   Hx of nephrolithiasis P:  Continue home potassium citrate for now  Former tobacco abuse with no formal workup or PFTs  P:  Baseline CXR today   SUMMARY OF TODAY'S PLAN:  Ongoing BP management TTE and vein mapping   Best Practice / Goals of Care / Disposition.   DVT PROPHYLAXIS: heparin gtt SUP: pepcid NUTRITION: heart healthy MOBILITY:  OOB to chair GOALS OF CARE:  Full  Patient updated on plan of care at bedside and family 10/15.   LABS  Glucose No results for input(s): GLUCAP in the last 168 hours.  BMET Recent Labs  Lab 05/18/18 0617 05/19/18 1011 05/20/18 0249  NA 139 139 140  K 3.4* 4.1 3.9  CL 104 104 106  CO2 24 27 27   BUN 9 12 12   CREATININE 0.80 0.81 0.85  GLUCOSE 111* 112* 101*    Liver Enzymes Recent Labs  Lab 05/17/18 1031 05/18/18 0617 05/19/18 1011 05/20/18 0249  AST 40 28  --   --   ALT 16 15  --   --   ALKPHOS 86 62  --   --   BILITOT 1.1 0.9  --   --   ALBUMIN 4.6 3.6 3.3* 2.6*    Electrolytes Recent Labs  Lab 05/18/18 0617 05/19/18 1011 05/20/18 0249  CALCIUM 9.1 8.9 8.4*  MG 1.8 2.1 1.9  PHOS  --  2.4* 3.4    CBC Recent Labs  Lab 05/17/18 1616 05/18/18 0617 05/20/18 0249  WBC 10.1 6.9 4.2  HGB 15.0 13.9 11.5*  HCT 48.1* 43.0 37.0  PLT 245 211 166    ABG No results for input(s): PHART, PCO2ART, PO2ART in the last 168 hours.  Coag's Recent Labs  Lab 05/17/18 1648  INR 0.98    Sepsis Markers Recent Labs  Lab 05/17/18 1635  LATICACIDVEN 3.48*    Cardiac Enzymes Recent Labs  Lab 05/17/18 2110 05/18/18 0617 05/19/18 1011  TROPONINI 0.03* 0.48* 7.50*   Posey Boyer, AGACNP-BC [facility] Pgr:  [phone] or if no answer [phone] 05/20/2018, 8:15 AM  Attending Note:  77 year old female vasculopath with a N-STEMI who presents with AAA and needing drips for BP control.  While in the ICU N-STEMI was diagnosed and patient was taken to the cath lab and now being recommended for a CABG.  Undergoing vein mapping and echo today.  On exam, lungs are clear.  I reviewed chest CT myself, no emphysema noted but evidence of chronic bronchitis noted.  Discussed with PCCM-NP.  N-STEMI  - Heparin  - ASA  - Vein mapping  - Echo  AAA:  - HR control as ordered  - Tele monitoring  - ?vascular will repair while in the OR, not sure  HTN:  - Switch to PO  - Control of HR is the most important aspect  at this point  CAD:  - CABG  - Vein mapping  - Echo  Hypoxemia:  - Titrate O2 for sat of 92-95% given CAD  Chronic bronchitis:  - Albuterol PRN (patient is on non as OP)  - Will need PFTs at one point but can be done as outpatient  Hold in the ICU for CABG in AM.  Patient seen and examined, agree with above note.  I dictated the care and orders written for this patient under my direction.  [name], MD [phone]

## 2018-05-20 NOTE — Progress Notes (Addendum)
Progress Note  Patient Name: Elizabeth [name] Date of Encounter: 05/20/2018  Primary Cardiologist: Elizabeth [name]  Subjective   Patient feeling okay this AM. She states her pain back pain is better today at 2/10 and denies shortness of breath.  Patient wanted to discuss PCI options; she was informed today that their is no good PCI option for her anatomy and that surgery would be her best course of action. Patient now amenable to surgery.  Inpatient Medications    Scheduled Meds: . amLODipine  10 mg Oral Daily  . docusate sodium  100 mg Oral Daily  . famotidine  20 mg Oral BID  . mouth rinse  15 mL Mouth Rinse BID  . metoprolol tartrate  50 mg Oral BID  . oxyCODONE  10 mg Oral Q12H  . polyethylene glycol  17 g Oral Daily  . potassium citrate  10 mEq Oral BID WC  . rosuvastatin  20 mg Oral q1800  . sodium chloride flush  3 mL Intravenous Q12H   Continuous Infusions: . sodium chloride    . clevidipine Stopped (05/18/18 2354)  . heparin 700 Units/hr (05/20/18 0230)  . lactated ringers 182 mL/hr at 05/19/18 1500   PRN Meds: sodium chloride, acetaminophen, fentaNYL (SUBLIMAZE) injection, HYDROcodone-acetaminophen, metoprolol tartrate, ondansetron (ZOFRAN) IV, sodium chloride flush   Vital Signs    Vitals:   05/20/18 0300 05/20/18 0400 05/20/18 0500 05/20/18 0600  BP: (!) 113/54 (!) 116/59 (!) 116/54 (!) 101/52  Pulse: 72 75 67 65  Resp: 12 17 16 14   Temp:  98.2 F (36.8 C)    TempSrc:  Oral    SpO2: 93% 95% 95% 95%  Weight:   61 kg   Height:        Intake/Output Summary (Last 24 hours) at 05/20/2018 0648 Last data filed at 05/20/2018 0600 Gross per 24 hour  Intake 621.32 ml  Output 1180 ml  Net -558.68 ml   Filed Weights   05/18/18 0600 05/19/18 0530 05/20/18 0500  Weight: 63.6 kg 60.8 kg 61 kg    Telemetry    Sinus Rhythm, Few PVCs - Personally Reviewed  ECG   None Today  Physical Exam   GEN: No acute distress.   Neck: No JVD, Bilateral Bruit    Cardiac: RRR; Systolic murmur; No rubs, or gallops.  Respiratory: Clear to auscultation bilaterally. GI: Soft, nontender, non-distended  MS: No edema; No deformity; Clean R ulnar cath site, pulse intact, bruising Neuro:  Nonfocal  Psych: Normal affect   Labs    Chemistry Recent Labs  Lab 05/17/18 1031  05/18/18 0617 05/19/18 1011 05/20/18 0249  NA 141   < > 139 139 140  K 5.6*   < > 3.4* 4.1 3.9  CL 103   < > 104 104 106  CO2 28   < > 24 27 27   GLUCOSE 125*   < > 111* 112* 101*  BUN 16   < > 9 12 12   CREATININE 0.96   < > 0.80 0.81 0.85  CALCIUM 9.6   < > 9.1 8.9 8.4*  PROT 8.0  --  7.0  --   --   ALBUMIN 4.6  --  3.6 3.3* 2.6*  AST 40  --  28  --   --   ALT 16  --  15  --   --   ALKPHOS 86  --  62  --   --   BILITOT 1.1  --  0.9  --   --  GFRNONAA 56*   < > >60 >60 >60  GFRAA >60   < > >60 >60 >60  ANIONGAP 10   < > 11 8 7    < > = values in this interval not displayed.     Hematology Recent Labs  Lab 05/17/18 1616 05/18/18 0617 05/20/18 0249  WBC 10.1 6.9 4.2  RBC 4.64 4.21 3.53*  HGB 15.0 13.9 11.5*  HCT 48.1* 43.0 37.0  MCV 103.7* 102.1* 104.8*  MCH 32.3 33.0 32.6  MCHC 31.2 32.3 31.1  RDW 12.3 12.6 12.5  PLT 245 211 166    Cardiac Enzymes Recent Labs  Lab 05/17/18 1621 05/17/18 2110 05/18/18 0617 05/19/18 1011  TROPONINI <0.03 0.03* 0.48* 7.50*    Recent Labs  Lab 05/17/18 1631  TROPIPOC 0.00     BNPNo results for input(s): BNP, PROBNP in the last 168 hours.   DDimer No results for input(s): DDIMER in the last 168 hours.   Radiology    No results found.  Cardiac Studies   Cath 05/19/18  Total occlusion of the mid to distal RCA.  Distal RCA fills via well-formed collaterals around the left ventricular apex from the LAD.  Total occlusion of a relatively small circumflex.  Minimal collaterals are noted.  Ostial 60% left main with pressure damping noted during engagement.  At least 60% distal left main is noted.  Heavily  calcified 80% proximal LAD followed by eccentric calcified slitlike 70% mid LAD stenosis.  Normal LV function with EF 55%.  RECOMMENDATIONS:   The patient has a single remaining conduit which is the left main coronary artery supplying the LAD.  Both the circumflex and right coronary are occluded.  The distal left main, proximal LAD is aneurysmal and heavily calcified.  The LAD supplies well-formed collaterals to the PDA.  Recommend evaluation for potential surgical revascularization.  No appealing interventional options exist.   Echo Today   Patient Profile     76 y.o. female with a hx of peripheral vascular disease status post left common iliac artery stenting followed by [place] vascular vein, bilateral carotid endarterectomy, hypertension and abdominal aortic aneurysm who is being seen for NSTEMI. Cath showed severe multi-vessel disease. CABG workup ongoing.  Assessment & Plan    1) Non-STEMI: In setting of acute ulceration of thoracic aorta. Troponin peaked to 7.  EKG with new anterior lateral ST depression. Severe multi-vessel disease on cath as above. CT Surgery has been consulted and have begun their work up. Patient wanted to discuss PCI options; she was informed today that their is no good PCI option for her anatomy and that surgery would be her best course of action. Patient now amenable to surgery. - Appreciate CT surgery recomendations - Continue Heparin gtt, ASA, Rosuvastatin 20mg  Daily, and Metoprolol 50mg  BID - Nitro gtt - Echo today - Saphenous vein mapping  2) Acute ulceration with intramural hematoma of the thoracic aorta. - Pain improved today, BP 120s-130s systolic this AM. Repeat scan planned.  3) Carotid artery disease: S/P bilateral endarterectomy. Doppler showed: Right ICA/CEA patent; 80-99% Left ICA stenosis.  - On Plavix  4) PAD: s/p left common iliac artery stenting  5) HTN: BP 120s-130s Sytolic this AM - Continue cleviprex, amlodipine and  metoprolol  - Nitro gtt as above  4. AAA: Slightly larger than prior, now at 4cm.    For questions or updates, please contact [facility] Please consult [email or web address] for contact info under      Signed, Elizabeth Cord, MD  05/20/2018, 6:48 AM    Agree with note by Dr. Ginette [name]  Elizabeth [name] had cardiac catheterization performed via the right ulnar artery by Dr. Katrinka Blazing yesterday.  She is high-grade ostial and distal left main, proximal LAD, occluded circumflex at the origin and occluded distal RCA at the origin of the PDA with grade 3 left-to-right collaterals.  Amazingly, she has preserved LV function.  Optimal revascularization strategy is coronary bypass grafting.  I had a long discussion with the patient today and she agrees to proceed.  A percutaneous approach would not be optimal given the severity of her disease.  She does have a small abdominal aortic aneurysm and a penetrating thoracic aortic ulcer with an intramural hematoma which will need to be taken into consideration.  Dr. Randie Heinz is her vascular surgeon who can address this as well.  Her exam is benign.  Vital signs are stable.  She is on IV heparin.  We will add low-dose aspirin as well as low-dose IV nitroglycerin given her residual low-grade chest pain with diffuse ST segment depression.  Runell Gess, M.D., FACP, [facility], [facility] [address] [place], [place]  [address]  [phone] 05/20/2018 9:56 AM

## 2018-05-21 ENCOUNTER — Inpatient Hospital Stay (HOSPITAL_COMMUNITY): Payer: Medicare Other

## 2018-05-21 DIAGNOSIS — I700 Atherosclerosis of aorta: Secondary | ICD-10-CM

## 2018-05-21 LAB — CBC
HEMATOCRIT: 37.2 % (ref 36.0–46.0)
HEMOGLOBIN: 12 g/dL (ref 12.0–15.0)
MCH: 33.1 pg (ref 26.0–34.0)
MCHC: 32.3 g/dL (ref 30.0–36.0)
MCV: 102.8 fL — ABNORMAL HIGH (ref 80.0–100.0)
NRBC: 0 % (ref 0.0–0.2)
Platelets: 173 10*3/uL (ref 150–400)
RBC: 3.62 MIL/uL — ABNORMAL LOW (ref 3.87–5.11)
RDW: 12.4 % (ref 11.5–15.5)
WBC: 5.2 10*3/uL (ref 4.0–10.5)

## 2018-05-21 LAB — RENAL FUNCTION PANEL
Albumin: 2.8 g/dL — ABNORMAL LOW (ref 3.5–5.0)
Anion gap: 9 (ref 5–15)
BUN: 14 mg/dL (ref 8–23)
CHLORIDE: 104 mmol/L (ref 98–111)
CO2: 26 mmol/L (ref 22–32)
Calcium: 9 mg/dL (ref 8.9–10.3)
Creatinine, Ser: 0.84 mg/dL (ref 0.44–1.00)
GFR calc non Af Amer: >60 mL/min (ref >60)
Glucose, Bld: 93 mg/dL (ref 70–99)
PHOSPHORUS: 3.1 mg/dL (ref 2.5–4.6)
POTASSIUM: 3.8 mmol/L (ref 3.5–5.1)
Sodium: 139 mmol/L (ref 135–145)

## 2018-05-21 LAB — BLOOD GAS, ARTERIAL
Acid-Base Excess: 2.1 mmol/L — ABNORMAL HIGH (ref 0.0–2.0)
Bicarbonate: 26 mmol/L (ref 20.0–28.0)
FIO2: 21
O2 Saturation: 96.3 %
Patient temperature: 98.6
pCO2 arterial: 39.4 mmHg (ref 32.0–48.0)
pH, Arterial: 7.435 (ref 7.350–7.450)
pO2, Arterial: 83.6 mmHg (ref 83.0–108.0)

## 2018-05-21 LAB — SPIROMETRY WITH GRAPH
FEF 25-75 Pre: 0.96 L/sec
FEF2575-%Pred-Pre: 64 %
FEV1-%Pred-Pre: 76 %
FEV1-Pre: 1.39 L
FEV1FVC-%Pred-Pre: 96 %
FEV6-%Pred-Pre: 83 %
FEV6-Pre: 1.93 L
FEV6FVC-%Pred-Pre: 105 %
FVC-%Pred-Pre: 78 %
FVC-Pre: 1.93 L
Pre FEV1/FVC ratio: 72 %
Pre FEV6/FVC Ratio: 100 %

## 2018-05-21 LAB — TROPONIN I: Troponin I: 4.14 ng/mL (ref <0.03)

## 2018-05-21 LAB — PLATELET INHIBITION P2Y12: Platelet Function  P2Y12: 270 [PRU] (ref 194–418)

## 2018-05-21 LAB — TRIGLYCERIDES: Triglycerides: 153 mg/dL — ABNORMAL HIGH (ref <150)

## 2018-05-21 LAB — HEPARIN LEVEL (UNFRACTIONATED)
Heparin Unfractionated: <0.1 IU/mL — ABNORMAL LOW (ref 0.30–0.70)
Heparin Unfractionated: 0.18 IU/mL — ABNORMAL LOW (ref 0.30–0.70)

## 2018-05-21 MED ORDER — ISOSORBIDE MONONITRATE ER 30 MG PO TB24
15.0000 mg | ORAL_TABLET | Freq: Every day | ORAL | Status: DC
  Administered 2018-05-21 – 2018-05-28 (×8): 15 mg via ORAL
  Filled 2018-05-21 (×8): qty 1

## 2018-05-21 MED ORDER — IOPAMIDOL (ISOVUE-370) INJECTION 76%
INTRAVENOUS | Status: AC
  Administered 2018-05-21: 90 mL
  Filled 2018-05-21: qty 50

## 2018-05-21 NOTE — Progress Notes (Signed)
2 Days Post-Op Procedure(s) (LRB): LEFT HEART CATH AND CORONARY ANGIOGRAPHY (N/A) Subjective:  77 year old with recent admission for thoracic aortic penetrating ulcers and focal dissection complicated by non-STEMI found to have chronic occlusion of the RCA with 80% stenosis of a complex proximal LAD stenosis  Back pain better today from penetrating ulcers and focal dissection of descending thoracic aorta Troponin now on decline Vein mapping shows probable saphenous vein conduit available Patient will be high risk for stroke because of severe left carotid disease and will assessment with CTA Patient has not been out of bed for 36 hours and cardiac rehab has been requested for post STEMI rehab Patient needs to recover from penetrating ulcers and focal dissection and recover from non-STEMI prior to potential surgery.  Surgical risk may be prohibitive due to cerebrovascular disease and diffuse vascular disease.  If PCI not possible then medical therapy may be her best long-term option Objective: Vital signs in last 24 hours: Temp:  [98.3 F (36.8 C)-98.9 F (37.2 C)] 98.5 F (36.9 C) (10/16 0842) Pulse Rate:  [66-103] 83 (10/16 0800) Cardiac Rhythm: Normal sinus rhythm (10/16 0800) Resp:  [10-21] 14 (10/16 0800) BP: (103-174)/(54-88) 136/72 (10/16 0800) SpO2:  [92 %-99 %] 96 % (10/16 0800) Weight:  [59.7 kg] 59.7 kg (10/16 0539)  Hemodynamic parameters for last 24 hours:    Intake/Output from previous day: 10/15 0701 - 10/16 0700 In: 557.1 [P.O.:440; I.V.:117.1] Out: 1000 [Urine:1000] Intake/Output this shift: Total I/O In: 111.1 [I.V.:111.1] Out: -   Regular rhythm Soft systolic murmur 2/6 Abdomen soft nontender Lungs clear  Lab Results: Recent Labs    05/20/18 0249 05/21/18 0430  WBC 4.2 5.2  HGB 11.5* 12.0  HCT 37.0 37.2  PLT 166 173   BMET:  Recent Labs    05/20/18 0249 05/21/18 0430  NA 140 139  K 3.9 3.8  CL 106 104  CO2 27 26  GLUCOSE 101* 93  BUN 12  14  CREATININE 0.85 0.84  CALCIUM 8.4* 9.0    PT/INR: No results for input(s): LABPROT, INR in the last 72 hours. ABG No results found for: PHART, HCO3, TCO2, ACIDBASEDEF, O2SAT CBG (last 3)  No results for input(s): GLUCAP in the last 72 hours.  Assessment/Plan: S/P Procedure(s) (LRB): LEFT HEART CATH AND CORONARY ANGIOGRAPHY (N/A) Continue current medical therapy We will follow-up results of neck CTA and discuss with Dr. Randie Heinz  LOS: 4 days    Kathlee Nations Trigt III 05/21/2018

## 2018-05-21 NOTE — Progress Notes (Signed)
ANTICOAGULATION CONSULT NOTE  Pharmacy Consult for Heparin Indication: chest pain/ACS - NSTEMI  Allergies  Allergen Reactions  . Codeine Other (See Comments)    Reaction: Unsure  . Sulfa Antibiotics Other (See Comments)    Mouth blisters    Patient Measurements: Height: 5\' 1"  (154.9 cm) Weight: 131 lb 9.8 oz (59.7 kg) IBW/kg (Calculated) : 47.8 Heparin Dosing Weight: 60 kg  Vital Signs: Temp: 98.6 F (37 C) (10/16 0000) Temp Source: Oral (10/16 0000) BP: 106/54 (10/16 0600) Pulse Rate: 67 (10/16 0600)  Labs: Recent Labs    05/19/18 1011 05/20/18 0249 05/20/18 1829 05/21/18 0430  HGB  --  11.5*  --  12.0  HCT  --  37.0  --  37.2  PLT  --  166  --  173  HEPARINUNFRC  --   --  <0.10* <0.10*  CREATININE 0.81 0.85  --  0.84  TROPONINI 7.50*  --   --  4.14*    Estimated Creatinine Clearance: 48.1 mL/min (by C-G formula based on SCr of 0.84 mg/dL).   Medical History: Past Medical History:  Diagnosis Date  . AAA (abdominal aortic aneurysm) (HCC)   . Hypertension   . Peripheral vascular disease (HCC)     Medications:  Scheduled:  . amLODipine  10 mg Oral Daily  . aspirin EC  81 mg Oral Daily  . docusate sodium  100 mg Oral Daily  . famotidine  20 mg Oral BID  . mouth rinse  15 mL Mouth Rinse BID  . metoprolol tartrate  50 mg Oral BID  . oxyCODONE  10 mg Oral Q12H  . polyethylene glycol  17 g Oral Daily  . potassium citrate  10 mEq Oral BID WC  . rosuvastatin  20 mg Oral q1800  . sodium chloride flush  3 mL Intravenous Q12H    Assessment: Elizabeth [name] is a 72 YOF who was admitted on 10/12 with back pain found to have an aortic ulceration with intermural hematoma. Since her admission, repeat EKG has shown new ST depression and troponins increased up to 7.5, deemed to be an NSTEMI.  Underwent cardiac cath finding multivessel disease - now awaiting further cardiology work-up. Heparin infusion running on L side, heparin level able to be drawn from R side  after 6 pm (24 hours from cath). Level came back undetectable - no infusion issues or signs of infiltration. No s/sx of bleeding.     Of note, CT angio showed 2 penetrating ulcers with adjacent intramural hematoma (no dissection) - so no bolus and will reduce goal range.   Heparin level remains <0.1 this am     Goal of Therapy:  Heparin level 0.3-0.5 units/ml Monitor platelets by anticoagulation protocol: Yes   Plan:  Increase heparin infusion to 900 units/hr Obtain heparin level 8 hours after rate change Monitor daily HL, CBC, and for s/sx of bleeding Follow-up for cardiology plan  Talbert Cage, PharmD Clinical Pharmacist  05/21/2018     6:23 AM

## 2018-05-21 NOTE — Progress Notes (Signed)
ANTICOAGULATION CONSULT NOTE  Pharmacy Consult for Heparin Indication: NSTEMI  Allergies  Allergen Reactions  . Codeine Other (See Comments)    Reaction: Unsure  . Sulfa Antibiotics Other (See Comments)    Mouth blisters    Patient Measurements: Height: 5\' 1"  (154.9 cm) Weight: 131 lb 9.8 oz (59.7 kg) IBW/kg (Calculated) : 47.8 Heparin Dosing Weight: 60 kg  Vital Signs: Temp: 99.3 F (37.4 C) (10/16 1145) Temp Source: Oral (10/16 0842) BP: 131/65 (10/16 1318) Pulse Rate: 86 (10/16 1318)  Labs: Recent Labs    05/19/18 1011 05/20/18 0249 05/20/18 1829 05/21/18 0430 05/21/18 1448  HGB  --  11.5*  --  12.0  --   HCT  --  37.0  --  37.2  --   PLT  --  166  --  173  --   HEPARINUNFRC  --   --  <0.10* <0.10* 0.18*  CREATININE 0.81 0.85  --  0.84  --   TROPONINI 7.50*  --   --  4.14*  --     Estimated Creatinine Clearance: 48.1 mL/min (by C-G formula based on SCr of 0.84 mg/dL).  Assessment: Elizabeth [name] is a 30 YOF who was admitted on 10/12 with back pain found to have an aortic ulceration with intermural hematoma. Since her admission, repeat EKG has shown new ST depression and troponins increased up to 7.5, deemed to be an NSTEMI.  Underwent cardiac cath finding multivessel disease - now awaiting further cardiology work-up. Heparin restarted post cath. Heparin level remains subtherapeutic (0.18) on gtt at 900 units/hr.  No issues with line or bleeding reported per RN.  Of note, CT angio showed 2 penetrating ulcers with adjacent intramural hematoma (no dissection) - so no bolus and will reduce goal range.      Goal of Therapy:  Heparin level 0.3-0.5 units/ml Monitor platelets by anticoagulation protocol: Yes   Plan:  Increase heparin infusion to 1050 units/hr Obtain heparin level 8 hours after rate change Monitor daily HL, CBC, and for s/sx of bleeding Follow-up for cardiology plan  Christoper Fabian, PharmD, BCPS Clinical pharmacist  **Pharmacist phone directory  can now be found on [email or web address] ([email or web address]).  Listed under [facility]. 05/21/2018     3:45 PM

## 2018-05-21 NOTE — Plan of Care (Signed)

## 2018-05-21 NOTE — Progress Notes (Signed)
PULMONARY / CRITICAL CARE MEDICINE   NAME:  Elizabeth [name], MRN:  [id number], DOB:  1941-04-24, LOS: 4 ADMISSION DATE:  05/17/2018,, CHIEF COMPLAINT: Back pain  BRIEF HISTORY:    77 year old with severe peripheral vascular disease presenting with back pain radiating beneath both costal margins bilaterally found to have descending thoracic aorta ulcerations.  SIGNIFICANT PAST MEDICAL HISTORY   HTN, PVD, infrarenal AAA, bilateral carotid endarterectomy, stenting of the iliac artery.  SIGNIFICANT EVENTS:  10/12 Admit >> 10/14 NSTEMI >> LHC  STUDIES:   10/12 CTA chest/ abd/ pelvis >> 1. 5 mm proximal descending thoracic aorta and 12 mm distal descending thoracic aorta penetrating ulcers with adjacent intramural hematoma. No dissection. 2. Intermediate density thickening of the central pulmonary arteries could reflect a small amount of mediastinal hematoma versus possibly wall thickening related to underlying vasculitis. 3. 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 4. Interval enlargement of the infrarenal abdominal aortic aneurysm, now measuring 4.0 cm. Recommend followup by ultrasound in 1 year.  5. 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 Mild stenosis of the proximal left common carotid artery. 6. Mild stenosis of the celiac artery origin. Moderate stenosis of the SMA and left renal artery origins. 7. 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 8. Left common and external iliac stent with severe focal stenosis of the external iliac artery just distal to the stent. Other: 1. Nonobstructive left nephrolithiasis. 2. Moderate fat containing left inguinal hernia.  10/14 Carotid duplex >>  10/14 LHC >> - total occlusion of the mid to distal RCA.  Distal RCA fills via well-formed collaterals around the left ventricular apex from the LAD. - Total occlusion of a relatively small circumflex.  Minimal  collaterals are noted. - Ostial 60% left main with pressure damping noted during engagement.  At least 60% distal left main is noted. - Heavily calcified 80% proximal LAD followed by eccentric calcified slitlike 70% mid LAD stenosis. - Normal LV function with EF 55%  TTE >> Saphenous vein mapping >>  CULTURES:  05/17/2018 MRSA PCR >> neg  ANTIBIOTICS:  None  LINES/TUBES:  PIV  CONSULTANTS:  10/12 Vascular Surgery  10/14 Cardiology 10/14 TCTS  SUBJECTIVE:  No events overnight, no new complaints  CONSTITUTIONAL: BP 134/68   Pulse 72   Temp 99.3 F (37.4 C)   Resp 13   Ht 5\' 1"  (1.549 m)   Wt 59.7 kg   SpO2 96%   BMI 24.87 kg/m   I/O last 3 completed shifts: In: 817.1 [P.O.:440; I.V.:377.1] Out: 1400 [Urine:1400]    PHYSICAL EXAM: General:  Elderly female, resting in exam bed, NAD HEENT: /AT, PERRL, EOM-I and MMM Neuro:  Alert and interactive, moving all ext to command CV: RRR, Nl S1/S2 and -M/R/G PULM: Decreased BS at the bases GI: Soft, NT, ND and +BS Extremities: -edema and -tenderness Skin: no rashes   I reviewed CXR myself, no acute disease note  RESOLVED PROBLEM LIST   ASSESSMENT AND PLAN   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  Discussed with cardiology P:  - CABG after addressing carotid and AAA - Vascular surgery would like to wait for 2 days for repeat CT - Strict BP and HR control   NSTEMI - LHC 10/15 with total occlusions of circ and RCA, with single remaining  conduit to supply LAD - PCI not an option per Cardiology 10/15 - TCTS evaluated 10/14 and will be very high risk P:  - Heparin per pharmacy - TTE and vein mapping per CVTS - PFTs today - Cards and TCTS following - ASA  Hypertension Urgency, PVD Tachycardia P:  - D/C drips for BP control - Continue norvasc 10 mg daily,  - Continue Lopressor 50 mg PO  BID - Prn lopressor  - Daily crestor   Chronic Left hip pain  P:  - OxyContin 10 mg BID  - PRN norco prn  - Tylenol prn   Hx of nephrolithiasis P:  - Potassium citrate as ordered  Former tobacco abuse with no formal workup or PFTs  P:  Baseline CXR today   SUMMARY OF TODAY'S PLAN:  Transfer to tele and to [facility] service with PCCM off 10/17.  Best Practice / Goals of Care / Disposition.   DVT PROPHYLAXIS: heparin gtt SUP: pepcid NUTRITION: heart healthy MOBILITY:  OOB to chair GOALS OF CARE:  Full  Patient updated on plan of care at bedside and family 10/15.   LABS  Glucose No results for input(s): GLUCAP in the last 168 hours.  BMET Recent Labs  Lab 05/19/18 1011 05/20/18 0249 05/21/18 0430  NA 139 140 139  K 4.1 3.9 3.8  CL 104 106 104  CO2 27 27 26   BUN 12 12 14   CREATININE 0.81 0.85 0.84  GLUCOSE 112* 101* 93    Liver Enzymes Recent Labs  Lab 05/17/18 1031 05/18/18 0617 05/19/18 1011 05/20/18 0249 05/21/18 0430  AST 40 28  --   --   --   ALT 16 15  --   --   --   ALKPHOS 86 62  --   --   --   BILITOT 1.1 0.9  --   --   --   ALBUMIN 4.6 3.6 3.3* 2.6* 2.8*    Electrolytes Recent Labs  Lab 05/18/18 0617 05/19/18 1011 05/20/18 0249 05/21/18 0430  CALCIUM 9.1 8.9 8.4* 9.0  MG 1.8 2.1 1.9  --   PHOS  --  2.4* 3.4 3.1    CBC Recent Labs  Lab 05/18/18 0617 05/20/18 0249 05/21/18 0430  WBC 6.9 4.2 5.2  HGB 13.9 11.5* 12.0  HCT 43.0 37.0 37.2  PLT 211 166 173    ABG No results for input(s): PHART, PCO2ART, PO2ART in the last 168 hours.  Coag's Recent Labs  Lab 05/17/18 1648  INR 0.98    Sepsis Markers Recent Labs  Lab 05/17/18 1635  LATICACIDVEN 3.48*    Cardiac Enzymes Recent Labs  Lab 05/18/18 0617 05/19/18 1011 05/21/18 0430  TROPONINI 0.48* 7.50* 4.14*   [name], M.D. [facility] Pulmonary/Critical Care Medicine. Pager: [phone]. After hours pager: [phone].

## 2018-05-21 NOTE — Progress Notes (Signed)
  Progress Note    05/21/2018 10:28 AM 2 Days Post-Op  Subjective: Chest and back pain much better  Vitals:   05/21/18 0900 05/21/18 1000  BP: (!) 152/80 (!) 149/68  Pulse: 92 84  Resp: 14 16  Temp:    SpO2: 98% 98%    Physical Exam: Awake alert oriented Nonlabored respirations Abdomen is soft Palpable pulses right lower extremity all the way to the posterior tibial, there are no palpable pulses on the left other than a weak femoral pulse both feet are well-perfused  CBC    Component Value Date/Time   WBC 5.2 05/21/2018 0430   RBC 3.62 (L) 05/21/2018 0430   HGB 12.0 05/21/2018 0430   HGB 15.3 10/02/2012 0608   HCT 37.2 05/21/2018 0430   HCT 44.6 10/02/2012 0608   PLT 173 05/21/2018 0430   PLT 262 10/02/2012 0608   MCV 102.8 (H) 05/21/2018 0430   MCV 101 (H) 10/02/2012 0608   MCH 33.1 05/21/2018 0430   MCHC 32.3 05/21/2018 0430   RDW 12.4 05/21/2018 0430   RDW 12.3 10/02/2012 0608   LYMPHSABS 1.6 10/02/2012 0608   MONOABS 0.6 10/02/2012 0608   EOSABS 0.0 10/02/2012 0608   BASOSABS 0.0 10/02/2012 0608    BMET    Component Value Date/Time   NA 139 05/21/2018 0430   NA 137 10/02/2012 0608   K 3.8 05/21/2018 0430   K 3.5 10/02/2012 0608   CL 104 05/21/2018 0430   CL 105 10/02/2012 0608   CO2 26 05/21/2018 0430   CO2 24 10/02/2012 0608   GLUCOSE 93 05/21/2018 0430   GLUCOSE 124 (H) 10/02/2012 0608   BUN 14 05/21/2018 0430   BUN 8 10/02/2012 0608   CREATININE 0.84 05/21/2018 0430   CREATININE 0.84 10/02/2012 0608   CALCIUM 9.0 05/21/2018 0430   CALCIUM 8.7 10/02/2012 0608   GFRNONAA >60 05/21/2018 0430   GFRNONAA >60 09/08/2012 1457   GFRAA >60 05/21/2018 0430   GFRAA >60 09/08/2012 1457    INR    Component Value Date/Time   INR 0.98 05/17/2018 1648   INR 1.0 10/02/2012 0608     Intake/Output Summary (Last 24 hours) at 05/21/2018 1028 Last data filed at 05/21/2018 0800 Gross per 24 hour  Intake 520.08 ml  Output 1000 ml  Net -479.92 ml      Assessment/plan:  77 y.o. female is here with thoracic IMH found to have high-grade stenosis in her carotid on duplex but I cannot corroborate this on her recent CTA.  We will need to repeat a CTA of her chest but I would likely wait another 2 days given a second contrast load.  Currently her pain is well controlled she has no palpable effusion and does not have any indication for aortic intervention.    Ambar Raphael C. Randie Heinz, MD [facility] Office: [phone] Pager: [phone]  05/21/2018 10:28 AM

## 2018-05-21 NOTE — Progress Notes (Addendum)
Progress Note  Patient Name: Elizabeth [name] Date of Encounter: 05/21/2018  Primary Cardiologist: Dr. Allyson Sabal  Subjective   Patient feeling okay today. He back pain remains stable at 2/10. She denies chest pain or shortness of breath.    Inpatient Medications    Scheduled Meds: . amLODipine  10 mg Oral Daily  . aspirin EC  81 mg Oral Daily  . docusate sodium  100 mg Oral Daily  . famotidine  20 mg Oral BID  . mouth rinse  15 mL Mouth Rinse BID  . metoprolol tartrate  50 mg Oral BID  . oxyCODONE  10 mg Oral Q12H  . polyethylene glycol  17 g Oral Daily  . potassium citrate  10 mEq Oral BID WC  . rosuvastatin  20 mg Oral q1800  . sodium chloride flush  3 mL Intravenous Q12H   Continuous Infusions: . sodium chloride    . clevidipine Stopped (05/18/18 2354)  . heparin 900 Units/hr (05/21/18 0622)  . lactated ringers 182 mL/hr at 05/19/18 1500  . nitroGLYCERIN 5 mcg/min (05/20/18 1800)   PRN Meds: sodium chloride, acetaminophen, fentaNYL (SUBLIMAZE) injection, HYDROcodone-acetaminophen, metoprolol tartrate, ondansetron (ZOFRAN) IV, sodium chloride flush   Vital Signs    Vitals:   05/21/18 0400 05/21/18 0500 05/21/18 0539 05/21/18 0600  BP: 132/78 114/62  (!) 106/54  Pulse: 91 83  67  Resp: 14 15  12   Temp: 98.3 F (36.8 C)     TempSrc: Oral     SpO2: 99% 93%  93%  Weight:   59.7 kg   Height:        Intake/Output Summary (Last 24 hours) at 05/21/2018 0647 Last data filed at 05/21/2018 0600 Gross per 24 hour  Intake 555.62 ml  Output 1000 ml  Net -444.38 ml   Filed Weights   05/19/18 0530 05/20/18 0500 05/21/18 0539  Weight: 60.8 kg 61 kg 59.7 kg    Telemetry    Sinus Rhythm, Episode of Sinus Tach to 103  - Personally Reviewed  ECG   None Today  Physical Exam   GEN: No acute distress.   Neck: No JVD, Bilateral Bruit  Cardiac: RRR; Systolic murmur; No rubs, or gallops.  Respiratory: Clear to auscultation bilaterally. GI: Soft, nontender,  non-distended  MS: No edema; No deformity; Clean R ulnar cath site, pulse intact Neuro:  Nonfocal  Psych: Normal affect   Labs    Chemistry Recent Labs  Lab 05/17/18 1031  05/18/18 0617 05/19/18 1011 05/20/18 0249 05/21/18 0430  NA 141   < > 139 139 140 139  K 5.6*   < > 3.4* 4.1 3.9 3.8  CL 103   < > 104 104 106 104  CO2 28   < > 24 27 27 26   GLUCOSE 125*   < > 111* 112* 101* 93  BUN 16   < > 9 12 12 14   CREATININE 0.96   < > 0.80 0.81 0.85 0.84  CALCIUM 9.6   < > 9.1 8.9 8.4* 9.0  PROT 8.0  --  7.0  --   --   --   ALBUMIN 4.6  --  3.6 3.3* 2.6* 2.8*  AST 40  --  28  --   --   --   ALT 16  --  15  --   --   --   ALKPHOS 86  --  62  --   --   --   BILITOT 1.1  --  0.9  --   --   --   GFRNONAA 56*   < > >60 >60 >60 >60  GFRAA >60   < > >60 >60 >60 >60  ANIONGAP 10   < > 11 8 7 9    < > = values in this interval not displayed.     Hematology Recent Labs  Lab 05/18/18 0617 05/20/18 0249 05/21/18 0430  WBC 6.9 4.2 5.2  RBC 4.21 3.53* 3.62*  HGB 13.9 11.5* 12.0  HCT 43.0 37.0 37.2  MCV 102.1* 104.8* 102.8*  MCH 33.0 32.6 33.1  MCHC 32.3 31.1 32.3  RDW 12.6 12.5 12.4  PLT 211 166 173    Cardiac Enzymes Recent Labs  Lab 05/17/18 2110 05/18/18 0617 05/19/18 1011 05/21/18 0430  TROPONINI 0.03* 0.48* 7.50* 4.14*    Recent Labs  Lab 05/17/18 1631  TROPIPOC 0.00     BNPNo results for input(s): BNP, PROBNP in the last 168 hours.   DDimer No results for input(s): DDIMER in the last 168 hours.   Radiology    No results found.  Cardiac Studies   Cath 05/19/18  Total occlusion of the mid to distal RCA.  Distal RCA fills via well-formed collaterals around the left ventricular apex from the LAD.  Total occlusion of a relatively small circumflex.  Minimal collaterals are noted.  Ostial 60% left main with pressure damping noted during engagement.  At least 60% distal left main is noted.  Heavily calcified 80% proximal LAD followed by eccentric calcified  slitlike 70% mid LAD stenosis.  Normal LV function with EF 55%.  RECOMMENDATIONS:   The patient has a single remaining conduit which is the left main coronary artery supplying the LAD.  Both the circumflex and right coronary are occluded.  The distal left main, proximal LAD is aneurysmal and heavily calcified.  The LAD supplies well-formed collaterals to the PDA.  Recommend evaluation for potential surgical revascularization.  No appealing interventional options exist.   Echo 05/20/18: Study Conclusions - Left ventricle: The cavity size was normal. Wall thickness was   normal. Systolic function was normal. The estimated ejection   fraction was in the range of 60% to 65%. Hypokinesis of the   mid-apicalinferolateral myocardium; consistent with ischemia in   the distribution of the right coronary or left circumflex   coronary artery. Doppler parameters are consistent with abnormal   left ventricular relaxation (grade 1 diastolic dysfunction). - Mitral valve: There was mild regurgitation.   Patient Profile     77 y.o. female with a hx of peripheral vascular disease status post left common iliac artery stenting followed by [place] vascular vein, bilateral carotid endarterectomy, hypertension and abdominal aortic aneurysm who is being seen for NSTEMI. Cath showed severe multi-vessel disease. CABG workup ongoing.  Assessment & Plan    1) Non-STEMI: 10/12, In setting of acute ulceration of thoracic aorta. Troponin peaked to 7. Severe multi-vessel disease on cath as above. Poor targets for PCI intervention discussed with patient. CT Surgery consulted and have begun their work up. ECHO with EF 60-65%, Hypokinesis of mid-apicalinferolateral myocardium, and G1DD. There is concern for surgical risk given patient's comorbid conditions. Patient is to work with cardiac rehab this week and under go further evaluation. Stable for transfer to telemetry today. - CABG workup per CT Surgery - Continue  Heparin gtt, ASA, Rosuvastatin 20mg  Daily, and Metoprolol 50mg  BID - Stop nitro gtt and start IMDUR  2) Acute ulceration with intramural hematoma of the thoracic aorta. - Pain  stable today, BP 120s systolic this AM. Repeat scan planned.  3) Carotid artery disease: S/P bilateral endarterectomy. Doppler showed: Right ICA/CEA patent; 80-99% Left ICA stenosis.  - On Plavix  4) PAD: s/p left common iliac artery stenting  5) HTN: BP 120s Sytolic this AM - Continue amlodipine, metoprolol, Nitro gtt - No Cleviprex use since 10/13, Will D/C  4. AAA: Slightly larger than prior, now at 4cm.    For questions or updates, please contact [facility] Please consult [email or web address] for contact info under      Signed, Beola Cord, MD  05/21/2018, 6:47 AM    Agree with note by Dr. Alinda Money  Elizabeth [name] had a non-STEMI.  Cardiac catheterization revealed high-grade ostial distal left main, proximal LAD disease with an occluded ostial circumflex and distal RCA with grade 3 left to right collaterals.  EF is normal.  She remains on IV heparin.  She is being evaluated by Dr. Maren Beach for CABG which I think is the optimal revascularization strategy given her anatomy although there may be confounding variables such as her vascular disease followed by Dr. Randie Heinz.  She no longer has chest/back pain.  We will wean her off her IV nitro and switch to a long-acting oral preparation.  Her exam is benign.  She can be transferred to telemetry bed.  Cardiac rehab.  Await final surgical decision.  Runell Gess, M.D., FACP, [facility], [facility] [address] [place], [place]  [address]  [phone] 05/21/2018 1:39 PM

## 2018-05-22 ENCOUNTER — Inpatient Hospital Stay (HOSPITAL_COMMUNITY): Payer: Medicare Other

## 2018-05-22 DIAGNOSIS — Z01810 Encounter for preprocedural cardiovascular examination: Secondary | ICD-10-CM

## 2018-05-22 LAB — BASIC METABOLIC PANEL
Anion gap: 8 (ref 5–15)
BUN: 10 mg/dL (ref 8–23)
CALCIUM: 8.8 mg/dL — AB (ref 8.9–10.3)
CHLORIDE: 102 mmol/L (ref 98–111)
CO2: 28 mmol/L (ref 22–32)
CREATININE: 0.76 mg/dL (ref 0.44–1.00)
Glucose, Bld: 101 mg/dL — ABNORMAL HIGH (ref 70–99)
Potassium: 4 mmol/L (ref 3.5–5.1)
Sodium: 138 mmol/L (ref 135–145)

## 2018-05-22 LAB — CBC
HCT: 35.7 % — ABNORMAL LOW (ref 36.0–46.0)
HEMOGLOBIN: 11.6 g/dL — AB (ref 12.0–15.0)
MCH: 33.2 pg (ref 26.0–34.0)
MCHC: 32.5 g/dL (ref 30.0–36.0)
MCV: 102.3 fL — ABNORMAL HIGH (ref 80.0–100.0)
PLATELETS: 186 10*3/uL (ref 150–400)
RBC: 3.49 MIL/uL — ABNORMAL LOW (ref 3.87–5.11)
RDW: 12.2 % (ref 11.5–15.5)
WBC: 4.7 10*3/uL (ref 4.0–10.5)
nRBC: 0 % (ref 0.0–0.2)

## 2018-05-22 LAB — HEPARIN LEVEL (UNFRACTIONATED)
HEPARIN UNFRACTIONATED: 0.29 [IU]/mL — AB (ref 0.30–0.70)
Heparin Unfractionated: 0.45 IU/mL (ref 0.30–0.70)

## 2018-05-22 NOTE — Progress Notes (Signed)
ANTICOAGULATION CONSULT NOTE  Pharmacy Consult for Heparin Indication: NSTEMI  Allergies  Allergen Reactions  . Codeine Other (See Comments)    Reaction: Unsure  . Sulfa Antibiotics Other (See Comments)    Mouth blisters    Patient Measurements: Height: 5\' 1"  (154.9 cm) Weight: 135 lb 12.9 oz (61.6 kg) IBW/kg (Calculated) : 47.8 Heparin Dosing Weight: 60 kg  Vital Signs: Temp: 98.9 F (37.2 C) (10/16 2014) Temp Source: Oral (10/17 0432) BP: 114/52 (10/17 0432) Pulse Rate: 66 (10/17 0432)  Labs: Recent Labs    05/19/18 1011  05/20/18 0249  05/21/18 0430 05/21/18 1448 05/22/18 0002 05/22/18 0614  HGB  --    < > 11.5*  --  12.0  --   --  11.6*  HCT  --   --  37.0  --  37.2  --   --  35.7*  PLT  --   --  166  --  173  --   --  186  HEPARINUNFRC  --   --   --    < > <0.10* 0.18* 0.29* 0.45  CREATININE 0.81  --  0.85  --  0.84  --   --   --   TROPONINI 7.50*  --   --   --  4.14*  --   --   --    < > = values in this interval not displayed.    Estimated Creatinine Clearance: 48.7 mL/min (by C-G formula based on SCr of 0.84 mg/dL).  Assessment: Elizabeth [name] is a 72 YOF who was admitted on 10/12 with back pain found to have an aortic ulceration with intermural hematoma. Since her admission, repeat EKG has shown new ST depression and troponins increased up to 7.5, deemed to be an NSTEMI.  Underwent cardiac cath finding multivessel disease - now awaiting further cardiology work-up. Heparin restarted post cath. Heparin at goal this morning.  No issues with line or bleeding reported per RN.  Of note, CT angio showed 2 penetrating ulcers with adjacent intramural hematoma (no dissection) - so no bolus and will reduce goal range.      Goal of Therapy:  Heparin level 0.3-0.5 units/ml Monitor platelets by anticoagulation protocol: Yes   Plan:  Heparin infusion at 1150 units/hr Monitor daily HL, CBC, and for s/sx of bleeding Follow-up for cardiology plan  Sheppard Coil  PharmD., BCPS Clinical Pharmacist 05/22/2018 8:06 AM

## 2018-05-22 NOTE — Progress Notes (Signed)
ANTICOAGULATION CONSULT NOTE  Pharmacy Consult for Heparin Indication: multivessel CAD   Allergies  Allergen Reactions  . Codeine Other (See Comments)    Reaction: Unsure  . Sulfa Antibiotics Other (See Comments)    Mouth blisters    Patient Measurements: Height: 5\' 1"  (154.9 cm) Weight: 131 lb 9.8 oz (59.7 kg) IBW/kg (Calculated) : 47.8 Heparin Dosing Weight: 60 kg  Vital Signs: Temp: 98.9 F (37.2 C) (10/16 2014) Temp Source: Oral (10/16 2014) BP: 143/63 (10/16 2014) Pulse Rate: 95 (10/16 2014)  Labs: Recent Labs    05/19/18 1011 05/20/18 0249  05/21/18 0430 05/21/18 1448 05/22/18 0002  HGB  --  11.5*  --  12.0  --   --   HCT  --  37.0  --  37.2  --   --   PLT  --  166  --  173  --   --   HEPARINUNFRC  --   --    < > <0.10* 0.18* 0.29*  CREATININE 0.81 0.85  --  0.84  --   --   TROPONINI 7.50*  --   --  4.14*  --   --    < > = values in this interval not displayed.    Estimated Creatinine Clearance: 48.1 mL/min (by C-G formula based on SCr of 0.84 mg/dL).  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

## 2018-05-22 NOTE — Care Management Important Message (Signed)
Important Message  Patient Details  Name: Elizabeth [name] MRN: [id number] Date of Birth: 1940/12/28   Medicare Important Message Given:  Yes    Shakeisha Horine P Shauntavia Brackin 05/22/2018, 11:18 AM

## 2018-05-22 NOTE — Progress Notes (Signed)
PROGRESS NOTE    Elizabeth [name]  WUJ:[id number] DOB: 06/11/1941 DOA: 05/17/2018 PCP: Kari Baars, MD   Brief Narrative:  HPI on 05/17/2018 by Dr. Warren Lacy (PCCM) This is a 77 year old with a history of peripheral vascular disease who had the sudden onset of severe back pain this morning with radiation around both flanks just below the ribs.  The pain was noncolicky and nonpleuritic.  There is  no associated chest pain or shortness of breath.  She was making coffee at the time of onset there have been no unusual exertion.  A CTA of the chest is shown to ulcerations of the descending aorta and she was referred from [place] to here for definitive care.  Interim history Admitted for descending thoracic aortic aneurysm and is currently being followed by vascular surgery.  Also noted to have an NSTEMI followed by cardiology, will need a CABG. 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, one proximal descending thoracic aorta-which is felt to be the cause of her IMH -Vascular surgery consulted and appreciated  NSTEMI -Cardiology consulted and appreciated, opponent peaked to 7 -Left heart catheterization on 05/20/2018 with total occlusions of the circumflex and RCA, single remaining conduit to supply LAD -Patient is not a PCI candidate as per cardiology -Cardiothoracic surgery consulted and appreciated, patient will be high risk -Continue heparin per pharmacy as well as aspirin, statin, metoprolol -Echocardiogram: EF 60 to 65%, hypokinesis of the mid apical inferior lateral myocardium.  Grade 1 diastolic dysfunction -Vein mapping ordered by surgery  Essential hypertension -Patient presented with hypertensive urgency and required drips for blood pressure control -Currently blood pressure is stable, continue Norvasc, Lopressor  Chronic left hip pain -Continue OxyContin, Tylenol as needed  History of nephrolithiasis -Continue  potassium citrate  Former tobacco abuser -PFTs have been ordered  PAD -Status post left common iliac artery stenting -CTA neck showed atheromatous change along great vessel origins, likely flow reducing involving the left subclavian -Vascular surgery following -Continue medications as above  DVT Prophylaxis  heparin  Code Status: Full  Family Communication: None at bedside  Disposition Plan: Admitted. Pending further recommendations from vascular surgery as well as cardiothoracic surgery  Consultants PCCM Cardiology Vascular surgery Cardiothoracic surgery  Procedures  Echocardiogram Carotid Doppler Left heart catheterization  Antibiotics   Anti-infectives (From admission, onward)   None      Subjective:   Elizabeth [name] seen and examined today.  Planes of hip pain but states it is currently a 2 out of 10.  Denies current chest pain, shortness breath, abdominal pain, nausea vomiting, diarrhea constipation, dizziness or headache.  Objective:   Vitals:   05/21/18 2014 05/22/18 0432 05/22/18 0500 05/22/18 0913  BP: (!) 143/63 (!) 114/52  (!) 153/75  Pulse: 95 66  (!) 108  Resp: 13 15    Temp: 98.9 F (37.2 C)     TempSrc: Oral Oral    SpO2: 98% 96%    Weight:   61.6 kg   Height:        Intake/Output Summary (Last 24 hours) at 05/22/2018 1239 Last data filed at 05/22/2018 0600 Gross per 24 hour  Intake 342.37 ml  Output 800 ml  Net -457.63 ml   Filed Weights   05/20/18 0500 05/21/18 0539 05/22/18 0500  Weight: 61 kg 59.7 kg 61.6 kg    Exam  General: Well developed, well nourished, NAD, appears stated age  HEENT: NCAT, mucous membranes moist.   Neck: Supple  Cardiovascular: S1 S2 auscultated, RRR, 2/6 SEM  Respiratory: Clear to auscultation bilaterally with equal chest rise  Abdomen: Soft, nontender, nondistended, + bowel sounds  Extremities: warm dry without cyanosis clubbing or edema  Neuro: AAOx3, nonfocal  Psych: Normal affect and  demeanor   Data Reviewed: I have personally reviewed following labs and imaging studies  CBC: Recent Labs  Lab 05/17/18 1616 05/18/18 0617 05/20/18 0249 05/21/18 0430 05/22/18 0614  WBC 10.1 6.9 4.2 5.2 4.7  HGB 15.0 13.9 11.5* 12.0 11.6*  HCT 48.1* 43.0 37.0 37.2 35.7*  MCV 103.7* 102.1* 104.8* 102.8* 102.3*  PLT 245 211 166 173 186   Basic Metabolic Panel: Recent Labs  Lab 05/18/18 0617 05/19/18 1011 05/20/18 0249 05/21/18 0430 05/22/18 0614  NA 139 139 140 139 138  K 3.4* 4.1 3.9 3.8 4.0  CL 104 104 106 104 102  CO2 24 27 27 26 28   GLUCOSE 111* 112* 101* 93 101*  BUN 9 12 12 14 10   CREATININE 0.80 0.81 0.85 0.84 0.76  CALCIUM 9.1 8.9 8.4* 9.0 8.8*  MG 1.8 2.1 1.9  --   --   PHOS  --  2.4* 3.4 3.1  --    GFR: Estimated Creatinine Clearance: 51.1 mL/min (by C-G formula based on SCr of 0.76 mg/dL). Liver Function Tests: Recent Labs  Lab 05/17/18 1031 05/18/18 0617 05/19/18 1011 05/20/18 0249 05/21/18 0430  AST 40 28  --   --   --   ALT 16 15  --   --   --   ALKPHOS 86 62  --   --   --   BILITOT 1.1 0.9  --   --   --   PROT 8.0 7.0  --   --   --   ALBUMIN 4.6 3.6 3.3* 2.6* 2.8*   Recent Labs  Lab 05/17/18 1031 05/17/18 1621  LIPASE 41 29  AMYLASE  --  132*   No results for input(s): AMMONIA in the last 168 hours. Coagulation Profile: Recent Labs  Lab 05/17/18 1648  INR 0.98   Cardiac Enzymes: Recent Labs  Lab 05/17/18 1621 05/17/18 2110 05/18/18 0617 05/19/18 1011 05/21/18 0430  TROPONINI <0.03 0.03* 0.48* 7.50* 4.14*   BNP (last 3 results) No results for input(s): PROBNP in the last 8760 hours. HbA1C: No results for input(s): HGBA1C in the last 72 hours. CBG: No results for input(s): GLUCAP in the last 168 hours. Lipid Profile: Recent Labs    05/20/18 0249 05/21/18 0430  CHOL 99  --   HDL 28*  --   LDLCALC 43  --   TRIG 139 153*  CHOLHDL 3.5  --    Thyroid Function Tests: No results for input(s): TSH, T4TOTAL, FREET4,  T3FREE, THYROIDAB in the last 72 hours. Anemia Panel: No results for input(s): VITAMINB12, FOLATE, FERRITIN, TIBC, IRON, RETICCTPCT in the last 72 hours. Urine analysis:    Component Value Date/Time   COLORURINE YELLOW (A) 05/17/2018 1032   APPEARANCEUR CLEAR (A) 05/17/2018 1032   LABSPEC 1.017 05/17/2018 1032   PHURINE 7.0 05/17/2018 1032   GLUCOSEU NEGATIVE 05/17/2018 1032   HGBUR NEGATIVE 05/17/2018 1032   BILIRUBINUR NEGATIVE 05/17/2018 1032   KETONESUR NEGATIVE 05/17/2018 1032   PROTEINUR 30 (A) 05/17/2018 1032   UROBILINOGEN 0.2 01/01/2011 2129   NITRITE NEGATIVE 05/17/2018 1032   LEUKOCYTESUR NEGATIVE 05/17/2018 1032   Sepsis Labs: @LABRCNTIP (procalcitonin:4,lacticidven:4)  ) Recent Results (from the past 240 hour(s))  MRSA PCR Screening     Status:  None   Collection Time: 05/17/18  5:30 PM  Result Value Ref Range Status   MRSA by PCR NEGATIVE NEGATIVE Final    Comment:        The GeneXpert MRSA Assay (FDA approved for NASAL specimens only), is one component of a comprehensive MRSA colonization surveillance program. It is not intended to diagnose MRSA infection nor to guide or monitor treatment for MRSA infections. Performed at [facility], [address], [place], [place] [address]   Surgical pcr screen     Status: None   Collection Time: 05/20/18  6:10 PM  Result Value Ref Range Status   MRSA, PCR NEGATIVE NEGATIVE Final   Staphylococcus aureus NEGATIVE NEGATIVE Final    Comment: (NOTE) The Xpert SA Assay (FDA approved for NASAL specimens in patients 60 years of age and older), is one component of a comprehensive surveillance program. It is not intended to diagnose infection nor to guide or monitor treatment. Performed at [facility], [address], [place], [place] [address]       Radiology Studies: Dg Chest 2 View  Result Date: 05/22/2018 CLINICAL DATA:  Recent myocardial infarction EXAM: CHEST - 2 VIEW COMPARISON:  05/17/2018  FINDINGS: Cardiac shadow is at the upper limits of normal in size. The lungs are well aerated bilaterally. Calcified granuloma is noted in the left apex stable from the prior exam. New left retrocardiac density is noted with associated effusion. No acute bony abnormality is noted. IMPRESSION: New left retrocardiac atelectasis with associated left-sided effusion. These changes somewhat are similar to the recent CTA of the neck. Electronically Signed   By: Alcide Clever M.D.   On: 05/22/2018 09:52   Ct Angio Neck W Or Wo Contrast  Result Date: 05/21/2018 CLINICAL DATA:  Arterial stricture and occlusion. Follow-up stenosis. Non STEMI. Back pain. EXAM: CT ANGIOGRAPHY NECK TECHNIQUE: Multidetector CT imaging of the neck was performed using the standard protocol during bolus administration of intravenous contrast. Multiplanar CT image reconstructions and MIPs were obtained to evaluate the vascular anatomy. Carotid stenosis measurements (when applicable) are obtained utilizing NASCET criteria, using the distal internal carotid diameter as the denominator. CONTRAST:  90mL ISOVUE-370 IOPAMIDOL (ISOVUE-370) INJECTION 76% COMPARISON:  CTA neck 02/27/2012.  CT chest 05/17/2018. FINDINGS: Aortic arch: Direct origin of small LEFT vertebral from the arch. Extreme calcification at the LEFT subclavian origin, suspected high-grade stenosis. Nonstenotic calcification at the origin of the LEFT common carotid, innominate, RIGHT common carotid, and RIGHT subclavian. Posterior penetrating ulcers of the thoracic aorta are observed. These measure 4 x 5 mm as seen on series 5, image 96, and 6 x 11 mm on series 5, image 109. Intramural hematoma redemonstrated. These appears slightly increased from prior recent chest CT. In addition, there is a new LEFT pleural effusion, intermediate Hounsfield attenuation 15-20 HU, which was not present on 05/17/2018. Right carotid system: No evidence of dissection, stenosis (50% or greater) or occlusion.  Reportedly, the patient is status post endarterectomy. Left carotid system: No evidence of dissection, stenosis (50% or greater) or occlusion. Reportedly, the patient is status post endarterectomy. Vertebral arteries: Dominant RIGHT vertebral. Ostial stenosis calcification of the RIGHT vertebral estimated 50%. No similar ostial stenosis of the LEFT vertebral. No clear stenosis in the neck. LEFT vertebral contributes directly to the LEFT PICA. Skeleton: Spondylosis. Other neck: No masses. Upper chest: New LEFT pleural effusion. IMPRESSION: No significant carotid stenosis status post reportedly BILATERAL carotid endarterectomy. Marked improvement compared with 02/27/2012. Posterior penetrating ulcers  of the thoracic aorta, which appear slightly increased from recent chest CT. In addition, there is a new LEFT pleural effusion, of intermediate attenuation. Atheromatous change all great vessel origins, likely flow reducing involving the LEFT subclavian; see discussion above. These results were called by telephone at the time of interpretation on 05/21/2018 at 1:18 pm to Dr. Randie Heinz, Who verbally acknowledged these results. Electronically Signed   By: Elsie Stain M.D.   On: 05/21/2018 13:23     Scheduled Meds: . amLODipine  10 mg Oral Daily  . aspirin EC  81 mg Oral Daily  . docusate sodium  100 mg Oral Daily  . famotidine  20 mg Oral BID  . isosorbide mononitrate  15 mg Oral Daily  . mouth rinse  15 mL Mouth Rinse BID  . metoprolol tartrate  50 mg Oral BID  . oxyCODONE  10 mg Oral Q12H  . polyethylene glycol  17 g Oral Daily  . potassium citrate  10 mEq Oral BID WC  . rosuvastatin  20 mg Oral q1800  . sodium chloride flush  3 mL Intravenous Q12H   Continuous Infusions: . sodium chloride    . heparin 1,150 Units/hr (05/22/18 0921)  . lactated ringers 182 mL/hr at 05/19/18 1500     LOS: 5 days   Time Spent in minutes   30 minutes  Delois Silvester D.O. on 05/22/2018 at 12:39 PM  Between 7am to  7pm - Please see pager noted on [email or web address]  After 7pm go to [email or web address]  And look for the night coverage person covering for me after hours  [facility] Office  [phone]

## 2018-05-22 NOTE — Progress Notes (Addendum)
Progress Note  Patient Name: Elizabeth [name] Date of Encounter: 05/22/2018  Primary Cardiologist: Dr. Allyson Sabal  Subjective   Elizabeth [name] is feeling okay today. Her back pain remains stable at 2/10. Denies chest pain or SOB.  Inpatient Medications    Scheduled Meds: . amLODipine  10 mg Oral Daily  . aspirin EC  81 mg Oral Daily  . docusate sodium  100 mg Oral Daily  . famotidine  20 mg Oral BID  . isosorbide mononitrate  15 mg Oral Daily  . mouth rinse  15 mL Mouth Rinse BID  . metoprolol tartrate  50 mg Oral BID  . oxyCODONE  10 mg Oral Q12H  . polyethylene glycol  17 g Oral Daily  . potassium citrate  10 mEq Oral BID WC  . rosuvastatin  20 mg Oral q1800  . sodium chloride flush  3 mL Intravenous Q12H   Continuous Infusions: . sodium chloride    . heparin 1,150 Units/hr (05/22/18 0111)  . lactated ringers 182 mL/hr at 05/19/18 1500   PRN Meds: sodium chloride, acetaminophen, fentaNYL (SUBLIMAZE) injection, HYDROcodone-acetaminophen, metoprolol tartrate, ondansetron (ZOFRAN) IV, sodium chloride flush   Vital Signs    Vitals:   05/21/18 1318 05/21/18 2014 05/22/18 0432 05/22/18 0500  BP: 131/65 (!) 143/63 (!) 114/52   Pulse: 86 95 66   Resp: 13 13 15    Temp:  98.9 F (37.2 C)    TempSrc:  Oral Oral   SpO2: 100% 98% 96%   Weight:    61.6 kg  Height:        Intake/Output Summary (Last 24 hours) at 05/22/2018 0700 Last data filed at 05/22/2018 0600 Gross per 24 hour  Intake 493.16 ml  Output 1300 ml  Net -806.84 ml   Filed Weights   05/20/18 0500 05/21/18 0539 05/22/18 0500  Weight: 61 kg 59.7 kg 61.6 kg    Telemetry    Sinus Rhythm, Few PVCs  - Personally Reviewed  ECG   None Today  Physical Exam   GEN: No acute distress.   Neck: No JVD, Bilateral Bruit  Cardiac: RRR; Systolic murmur; No rubs, or gallops.  Respiratory: Clear to auscultation bilaterally. GI: Soft, nontender, non-distended  MS: No edema; No deformity; Clean R ulnar cath site,  pulse intact Neuro:  Nonfocal  Psych: Normal affect   Labs    Chemistry Recent Labs  Lab 05/17/18 1031  05/18/18 0617 05/19/18 1011 05/20/18 0249 05/21/18 0430  NA 141   < > 139 139 140 139  K 5.6*   < > 3.4* 4.1 3.9 3.8  CL 103   < > 104 104 106 104  CO2 28   < > 24 27 27 26   GLUCOSE 125*   < > 111* 112* 101* 93  BUN 16   < > 9 12 12 14   CREATININE 0.96   < > 0.80 0.81 0.85 0.84  CALCIUM 9.6   < > 9.1 8.9 8.4* 9.0  PROT 8.0  --  7.0  --   --   --   ALBUMIN 4.6  --  3.6 3.3* 2.6* 2.8*  AST 40  --  28  --   --   --   ALT 16  --  15  --   --   --   ALKPHOS 86  --  62  --   --   --   BILITOT 1.1  --  0.9  --   --   --  GFRNONAA 56*   < > >60 >60 >60 >60  GFRAA >60   < > >60 >60 >60 >60  ANIONGAP 10   < > 11 8 7 9    < > = values in this interval not displayed.     Hematology Recent Labs  Lab 05/18/18 0617 05/20/18 0249 05/21/18 0430  WBC 6.9 4.2 5.2  RBC 4.21 3.53* 3.62*  HGB 13.9 11.5* 12.0  HCT 43.0 37.0 37.2  MCV 102.1* 104.8* 102.8*  MCH 33.0 32.6 33.1  MCHC 32.3 31.1 32.3  RDW 12.6 12.5 12.4  PLT 211 166 173    Cardiac Enzymes Recent Labs  Lab 05/17/18 2110 05/18/18 0617 05/19/18 1011 05/21/18 0430  TROPONINI 0.03* 0.48* 7.50* 4.14*    Recent Labs  Lab 05/17/18 1631  TROPIPOC 0.00     BNPNo results for input(s): BNP, PROBNP in the last 168 hours.   DDimer No results for input(s): DDIMER in the last 168 hours.   Radiology    Ct Angio Neck W Or Wo Contrast  Result Date: 05/21/2018 CLINICAL DATA:  Arterial stricture and occlusion. Follow-up stenosis. Non STEMI. Back pain. EXAM: CT ANGIOGRAPHY NECK TECHNIQUE: Multidetector CT imaging of the neck was performed using the standard protocol during bolus administration of intravenous contrast. Multiplanar CT image reconstructions and MIPs were obtained to evaluate the vascular anatomy. Carotid stenosis measurements (when applicable) are obtained utilizing NASCET criteria, using the distal internal  carotid diameter as the denominator. CONTRAST:  90mL ISOVUE-370 IOPAMIDOL (ISOVUE-370) INJECTION 76% COMPARISON:  CTA neck 02/27/2012.  CT chest 05/17/2018. FINDINGS: Aortic arch: Direct origin of small LEFT vertebral from the arch. Extreme calcification at the LEFT subclavian origin, suspected high-grade stenosis. Nonstenotic calcification at the origin of the LEFT common carotid, innominate, RIGHT common carotid, and RIGHT subclavian. Posterior penetrating ulcers of the thoracic aorta are observed. These measure 4 x 5 mm as seen on series 5, image 96, and 6 x 11 mm on series 5, image 109. Intramural hematoma redemonstrated. These appears slightly increased from prior recent chest CT. In addition, there is a new LEFT pleural effusion, intermediate Hounsfield attenuation 15-20 HU, which was not present on 05/17/2018. Right carotid system: No evidence of dissection, stenosis (50% or greater) or occlusion. Reportedly, the patient is status post endarterectomy. Left carotid system: No evidence of dissection, stenosis (50% or greater) or occlusion. Reportedly, the patient is status post endarterectomy. Vertebral arteries: Dominant RIGHT vertebral. Ostial stenosis calcification of the RIGHT vertebral estimated 50%. No similar ostial stenosis of the LEFT vertebral. No clear stenosis in the neck. LEFT vertebral contributes directly to the LEFT PICA. Skeleton: Spondylosis. Other neck: No masses. Upper chest: New LEFT pleural effusion. IMPRESSION: No significant carotid stenosis status post reportedly BILATERAL carotid endarterectomy. Marked improvement compared with 02/27/2012. 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 In addition, there is a new LEFT pleural effusion, of intermediate attenuation. Atheromatous change all great vessel origins, likely flow reducing involving the LEFT subclavian; see discussion above. These results were called by telephone at the  time of interpretation on 05/21/2018 at 1:18 pm to Dr. Randie Heinz, Who verbally acknowledged these results. Electronically Signed   By: Elsie Stain M.D.   On: 05/21/2018 13:23    Cardiac Studies   Cath 05/19/18  Total occlusion of the mid to distal RCA.  Distal RCA fills via well-formed collaterals around the left ventricular apex from the LAD.  Total occlusion of a relatively small circumflex.  Minimal collaterals are noted.  Ostial 60% left main with pressure damping noted during engagement.  At least 60% distal left main is noted.  Heavily calcified 80% proximal LAD followed by eccentric calcified slitlike 70% mid LAD stenosis.  Normal LV function with EF 55%.  RECOMMENDATIONS:   The patient has a single remaining conduit which is the left main coronary artery supplying the LAD.  Both the circumflex and right coronary are occluded.  The distal left main, proximal LAD is aneurysmal and heavily calcified.  The LAD supplies well-formed collaterals to the PDA.  Recommend evaluation for potential surgical revascularization.  No appealing interventional options exist.   Echo 05/20/18: Study Conclusions - Left ventricle: The cavity size was normal. Wall thickness was   normal. Systolic function was normal. The estimated ejection   fraction was in the range of 60% to 65%. Hypokinesis of the   mid-apicalinferolateral myocardium; consistent with ischemia in   the distribution of the right coronary or left circumflex   coronary artery. Doppler parameters are consistent with abnormal   left ventricular relaxation (grade 1 diastolic dysfunction). - Mitral valve: There was mild regurgitation.   Patient Profile     77 y.o. female with a hx of peripheral vascular disease status post left common iliac artery stenting followed by [place] vascular vein, bilateral carotid endarterectomy, hypertension and abdominal aortic aneurysm who is being seen for NSTEMI. Cath showed severe multi-vessel  disease. CABG evaluation ongoing.  Assessment & Plan    1) Non-STEMI: NSTEMI on 10/12, In setting of acute ulceration of thoracic aorta. Troponin peaked to 7. Severe multi-vessel disease on cath as above.  ECHO with EF 60-65%, Hypokinesis of mid-apicalinferolateral myocardium, and G1DD. Poor anatomy for PCI intervention; surgery felt to be best strategy for revascularization. We await results of CT surgery evaluation and discussion with patient and vascualar surgery, given comorbid vascular disease. - CABG workup per CT Surgery - Appreciate Vascular surgery assistance  - Continue with cardiac rehab - Continue Heparin gtt, ASA, Rosuvastatin 20mg  Daily, and Metoprolol 50mg  BID, IMDUR  2) Acute ulceration with intramural hematoma of the thoracic aorta. - Pain stable today, BP 110s systolic this AM. Repeat scan planned.  3) Carotid artery disease: S/P bilateral endarterectomy. Doppler showed: Right ICA/CEA patent; 80-99% Left ICA stenosis. No significant stenosis of CTA Neck. - On Plavix  4) PAD: s/p left common iliac artery stenting. CTA neck showed Atheromatous change  Of all great vessel origins, likely flow reducing involving the LEFT subclavian. Vascular Surgery is following.  5) HTN: BP 120s Systolic this AM - Continue amlodipine, metoprolol, IMDUR  4. AAA: 4 cm, Slightly larger than previous.    For questions or updates, please contact [facility] Please consult [email or web address] for contact info under      Signed, Beola Cord, MD  05/22/2018, 7:00 AM    Agree with note by Dr. Alinda Money  Ms. Terrel remains clinically stable.  She is still being considered for CABG for high risk anatomy by Dr. Maren Beach.  She also has a penetrating ulcer and a intramural hematoma of her thoracic aorta followed by Dr. Randie Heinz.  She had a small non-STEMI with cath that showed an occluded distal RCA filling by LAD to PDA collaterals, left main and proximal LAD disease as well as an occluded  circumflex.  She has normal LV function.  While she has good targets for CABG her comorbidities make her high risk.  She is high risk for PCI and stenting as well.  Should  she be turned down for surgical revascularization I believe the best option would be continued medical therapy reserving high risk percutaneous intervention for recalcitrant symptoms or recurrent ischemic events.  She will be ambulated with cardiac rehab.  We will discontinue her intravenous heparin tomorrow.  Runell Gess, M.D., FACP, [facility], [facility] [address] [place], [place]  [address]  [phone] 05/22/2018 11:18 AM

## 2018-05-22 NOTE — Progress Notes (Signed)
  Progress Note    05/22/2018 12:41 PM 3 Days Post-Op  Subjective: No acute events, upper back pain has resolved  Vitals:   05/22/18 0432 05/22/18 0913  BP: (!) 114/52 (!) 153/75  Pulse: 66 (!) 108  Resp: 15   Temp:    SpO2: 96%     Physical Exam: Awake alert oriented Nonlabored respirations Abdomen is soft nontender Palpable right common femoral pulse weak on the left Both feet are warm  CBC    Component Value Date/Time   WBC 4.7 05/22/2018 0614   RBC 3.49 (L) 05/22/2018 0614   HGB 11.6 (L) 05/22/2018 0614   HGB 15.3 10/02/2012 0608   HCT 35.7 (L) 05/22/2018 0614   HCT 44.6 10/02/2012 0608   PLT 186 05/22/2018 0614   PLT 262 10/02/2012 0608   MCV 102.3 (H) 05/22/2018 0614   MCV 101 (H) 10/02/2012 0608   MCH 33.2 05/22/2018 0614   MCHC 32.5 05/22/2018 0614   RDW 12.2 05/22/2018 0614   RDW 12.3 10/02/2012 0608   LYMPHSABS 1.6 10/02/2012 0608   MONOABS 0.6 10/02/2012 0608   EOSABS 0.0 10/02/2012 0608   BASOSABS 0.0 10/02/2012 0608    BMET    Component Value Date/Time   NA 138 05/22/2018 0614   NA 137 10/02/2012 0608   K 4.0 05/22/2018 0614   K 3.5 10/02/2012 0608   CL 102 05/22/2018 0614   CL 105 10/02/2012 0608   CO2 28 05/22/2018 0614   CO2 24 10/02/2012 0608   GLUCOSE 101 (H) 05/22/2018 0614   GLUCOSE 124 (H) 10/02/2012 0608   BUN 10 05/22/2018 0614   BUN 8 10/02/2012 0608   CREATININE 0.76 05/22/2018 0614   CREATININE 0.84 10/02/2012 0608   CALCIUM 8.8 (L) 05/22/2018 0614   CALCIUM 8.7 10/02/2012 0608   GFRNONAA >60 05/22/2018 0614   GFRNONAA >60 09/08/2012 1457   GFRAA >60 05/22/2018 0614   GFRAA >60 09/08/2012 1457    INR    Component Value Date/Time   INR 0.98 05/17/2018 1648   INR 1.0 10/02/2012 0608     Intake/Output Summary (Last 24 hours) at 05/22/2018 1241 Last data filed at 05/22/2018 0600 Gross per 24 hour  Intake 342.37 ml  Output 800 ml  Net -457.63 ml     Assessment/plan:  77 y.o. female is here with thoracic  IMH and has been complicated by non-STEMI.  Cardio placed and is very high-grade stenosis on the left this is not corroborated on CTA.  She has had 3 contrast doses while inpatient.  We will plan CTA of chest abdomen pelvis tomorrow if creatinine remains stable.  She does have development of a small pleural effusion on the left and her thoracic aorta did appear somewhat larger on the low cuts from the CTA of the neck but given that she is hemodynamically stable currently her symptoms have improved there is no role for intervention at this time.  Will follow CT scan tomorrow and discussed with patient.   Trask Vosler C. Randie Heinz, MD [facility] Office: [phone] Pager: [phone]  05/22/2018 12:41 PM

## 2018-05-22 NOTE — Progress Notes (Addendum)
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 with L pleural effusion developing  thoracic aortic disease needs to show improvement/ treatment  before high risk CABG  Objective: Vital signs in last 24 hours: Temp:  [98.5 F (36.9 C)-99.3 F (37.4 C)] 98.9 F (37.2 C) (10/16 2014) Pulse Rate:  [66-95] 66 (10/17 0432) Cardiac Rhythm: Normal sinus rhythm (10/16 2200) Resp:  [13-17] 15 (10/17 0432) BP: (114-152)/(52-80) 114/52 (10/17 0432) SpO2:  [96 %-100 %] 96 % (10/17 0432) Weight:  [61.6 kg] 61.6 kg (10/17 0500)  Hemodynamic parameters for last 24 hours:    Intake/Output from previous day: 10/16 0701 - 10/17 0700 In: 493.2 [P.O.:120; I.V.:373.2] Out: 1300 [Urine:1300] Intake/Output this shift: No intake/output data recorded.    Lab Results: Recent Labs    05/21/18 0430 05/22/18 0614  WBC 5.2 4.7  HGB 12.0 11.6*  HCT 37.2 35.7*  PLT 173 186   BMET:  Recent Labs    05/21/18 0430 05/22/18 0614  NA 139 138  K 3.8 4.0  CL 104 102  CO2 26 28  GLUCOSE 93 101*  BUN 14 10  CREATININE 0.84 0.76  CALCIUM 9.0 8.8*    PT/INR: No results for input(s): LABPROT, INR in the last 72 hours. ABG    Component Value Date/Time   PHART 7.435 05/21/2018 1730   HCO3 26.0 05/21/2018 1730   O2SAT 96.3 05/21/2018 1730   CBG (last 3)  No results for input(s): GLUCAP in the last 72 hours.  Assessment/Plan: S/P Procedure(s) (LRB): LEFT HEART CATH AND CORONARY ANGIOGRAPHY (N/A) rec  Care with low dose heparin for  nonstemi-ACS with aortic hematomas from focal dissection Medical therapy of the CAD is the best option at this time  LOS: 5 days    Elizabeth [name] 05/22/2018

## 2018-05-22 NOTE — Progress Notes (Signed)
CARDIAC REHAB PHASE I   PRE:  Rate/Rhythm: 97 SR    BP: sitting 136/69    SaO2: 93 2L  MODE:  Ambulation: 70 ft   POST:  Rate/Rhythm: 113 ST    BP: sitting 111/63     SaO2: 93-95 2L  Pt eager to try to ambulate. Used RW and 2L, assist x2 and gait belt. She became dizzy initially with standing, this passed but she continued to have heavy legs for 40 ft. She was happy to walk but was weak. To recliner. BP decreased after walk. Denied back pain. Pt has IS and practiced to 1200 mL. Pt sts she is not wanting to pursue CABG and not interested in the [facility] booklet at this time. Reminded her the book is on the unit if she changes her mind.  [id number]  Harriet Masson CES, ACSM 05/22/2018 2:57 PM

## 2018-05-22 NOTE — Progress Notes (Signed)
Pre-CABG testing has been completed. Carotid artery duplex was completed on 05/19/18.  ABI's Right 1.01 Left 0.79  Palmar waveforms Right Palmar waveforms are obliterated with radial and ulnar compression. Left Palmar waveforms are obliterated with radial and ulnar compression.  05/22/18 12:56 PM Olen Cordial RVT

## 2018-05-22 NOTE — Plan of Care (Signed)

## 2018-05-23 ENCOUNTER — Inpatient Hospital Stay (HOSPITAL_COMMUNITY): Payer: Medicare Other

## 2018-05-23 ENCOUNTER — Encounter (HOSPITAL_COMMUNITY): Payer: Self-pay | Admitting: Radiology

## 2018-05-23 DIAGNOSIS — Z7189 Other specified counseling: Secondary | ICD-10-CM

## 2018-05-23 DIAGNOSIS — Z789 Other specified health status: Secondary | ICD-10-CM

## 2018-05-23 LAB — CBC
HCT: 36 % (ref 36.0–46.0)
Hemoglobin: 11.7 g/dL — ABNORMAL LOW (ref 12.0–15.0)
MCH: 33.1 pg (ref 26.0–34.0)
MCHC: 32.5 g/dL (ref 30.0–36.0)
MCV: 101.7 fL — AB (ref 80.0–100.0)
NRBC: 0 % (ref 0.0–0.2)
PLATELETS: 214 10*3/uL (ref 150–400)
RBC: 3.54 MIL/uL — ABNORMAL LOW (ref 3.87–5.11)
RDW: 12.1 % (ref 11.5–15.5)
WBC: 5.1 10*3/uL (ref 4.0–10.5)

## 2018-05-23 LAB — HEPARIN LEVEL (UNFRACTIONATED): Heparin Unfractionated: 0.46 IU/mL (ref 0.30–0.70)

## 2018-05-23 MED ORDER — IOPAMIDOL (ISOVUE-370) INJECTION 76%
100.0000 mL | Freq: Once | INTRAVENOUS | Status: AC | PRN
  Administered 2018-05-23: 100 mL via INTRAVENOUS

## 2018-05-23 MED ORDER — IOPAMIDOL (ISOVUE-370) INJECTION 76%
INTRAVENOUS | Status: AC
  Filled 2018-05-23: qty 100

## 2018-05-23 MED ORDER — ENOXAPARIN SODIUM 40 MG/0.4ML ~~LOC~~ SOLN
40.0000 mg | SUBCUTANEOUS | Status: DC
  Administered 2018-05-23 – 2018-05-27 (×5): 40 mg via SUBCUTANEOUS
  Filled 2018-05-23 (×5): qty 0.4

## 2018-05-23 NOTE — Progress Notes (Signed)
ANTICOAGULATION CONSULT NOTE  Pharmacy Consult for Heparin Indication: NSTEMI  Allergies  Allergen Reactions  . Codeine Other (See Comments)    Reaction: Unsure  . Sulfa Antibiotics Other (See Comments)    Mouth blisters    Patient Measurements: Height: 5\' 1"  (154.9 cm) Weight: 132 lb 15 oz (60.3 kg) IBW/kg (Calculated) : 47.8 Heparin Dosing Weight: 60 kg  Vital Signs: Temp: 99 F (37.2 C) (10/18 0500) Temp Source: Oral (10/18 0500) BP: 130/69 (10/18 0500) Pulse Rate: 82 (10/18 0500)  Labs: Recent Labs    05/21/18 0430  05/22/18 0002 05/22/18 0614 05/23/18 0458  HGB 12.0  --   --  11.6* 11.7*  HCT 37.2  --   --  35.7* 36.0  PLT 173  --   --  186 214  HEPARINUNFRC <0.10*   < > 0.29* 0.45 0.46  CREATININE 0.84  --   --  0.76  --   TROPONINI 4.14*  --   --   --   --    < > = values in this interval not displayed.    Estimated Creatinine Clearance: 50.6 mL/min (by C-G formula based on SCr of 0.76 mg/dL).  Assessment: Elizabeth [name] is a 40 YOF who was admitted on 10/12 with back pain found to have an aortic ulceration with intermural hematoma. Since her admission, repeat EKG has shown new ST depression and troponins increased up to 7.5, deemed to be an NSTEMI.  Underwent cardiac cath finding multivessel disease. Heparin restarted post cath. Heparin at goal this morning.  No issues with line or bleeding reported per RN. CVTS is holding off on surgery until she recovers from this dissection. Vascular if following closely.   Of note, CT angio showed 2 penetrating ulcers with adjacent intramural hematoma (no dissection) - so no bolus and will reduce goal range. Repeating CT angio today.      Goal of Therapy:  Heparin level 0.3-0.5 units/ml Monitor platelets by anticoagulation protocol: Yes   Plan:  Heparin infusion at 1150 units/hr Monitor daily HL, CBC, and for s/sx of bleeding Follow-up for cardiology plan  Sheppard Coil PharmD., BCPS Clinical  Pharmacist 05/23/2018 8:59 AM
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
## 2018-05-23 NOTE — Progress Notes (Signed)
  Progress Note    05/23/2018 8:28 AM 4 Days Post-Op  Subjective: Denies chest or upper back pain  Vitals:   05/22/18 2023 05/23/18 0500  BP: 140/66 130/69  Pulse: 94 82  Resp: 14 13  Temp: 98.3 F (36.8 C) 99 F (37.2 C)  SpO2: 94% 93%    Physical Exam: Awake alert oriented moving all extremities Abdomen is soft nontender Palpable right posterior tibial pulse Both feet are well-perfused  CBC    Component Value Date/Time   WBC 5.1 05/23/2018 0458   RBC 3.54 (L) 05/23/2018 0458   HGB 11.7 (L) 05/23/2018 0458   HGB 15.3 10/02/2012 0608   HCT 36.0 05/23/2018 0458   HCT 44.6 10/02/2012 0608   PLT 214 05/23/2018 0458   PLT 262 10/02/2012 0608   MCV 101.7 (H) 05/23/2018 0458   MCV 101 (H) 10/02/2012 0608   MCH 33.1 05/23/2018 0458   MCHC 32.5 05/23/2018 0458   RDW 12.1 05/23/2018 0458   RDW 12.3 10/02/2012 0608   LYMPHSABS 1.6 10/02/2012 0608   MONOABS 0.6 10/02/2012 0608   EOSABS 0.0 10/02/2012 0608   BASOSABS 0.0 10/02/2012 0608    BMET    Component Value Date/Time   NA 138 05/22/2018 0614   NA 137 10/02/2012 0608   K 4.0 05/22/2018 0614   K 3.5 10/02/2012 0608   CL 102 05/22/2018 0614   CL 105 10/02/2012 0608   CO2 28 05/22/2018 0614   CO2 24 10/02/2012 0608   GLUCOSE 101 (H) 05/22/2018 0614   GLUCOSE 124 (H) 10/02/2012 0608   BUN 10 05/22/2018 0614   BUN 8 10/02/2012 0608   CREATININE 0.76 05/22/2018 0614   CREATININE 0.84 10/02/2012 0608   CALCIUM 8.8 (L) 05/22/2018 0614   CALCIUM 8.7 10/02/2012 0608   GFRNONAA >60 05/22/2018 0614   GFRNONAA >60 09/08/2012 1457   GFRAA >60 05/22/2018 0614   GFRAA >60 09/08/2012 1457    INR    Component Value Date/Time   INR 0.98 05/17/2018 1648   INR 1.0 10/02/2012 0608     Intake/Output Summary (Last 24 hours) at 05/23/2018 0828 Last data filed at 05/23/2018 0600 Gross per 24 hour  Intake 3385.6 ml  Output 950 ml  Net 2435.6 ml     Assessment:  77 y.o. female is here with thoracic IMH  complicated by non-STEMI.  Plan: Repeat CT angio dissection protocol today. She appears comfortable and perfusing all of her end organs hopefully we can get by without any intervention during this hospitalization.   Khing Belcher C. Randie Heinz, MD [facility] Office: [phone] Pager: [phone]  05/23/2018 8:28 AM

## 2018-05-23 NOTE — Care Management Note (Signed)
Case Management Note  Patient Details  Name: SHYVONNE CHASTANG MRN: [id number] Date of Birth: 11-06-1940  Subjective/Objective:  Pt presented for Nstemi-PTA from home alone, however pt will transition to son's home after discharge. PT recommendations for [facility] PT- Pt is agreeable to [facility] RN as well for disease management. Agency List provided and patient chose [facility] for Services.                   Action/Plan: CM did make referral with [facility] and SOC to begin within 24-48 hours post transition home. DME will be delivered to room prior to transition home. No further needs from CM at this time.   Expected Discharge Date:  05-23-18            Expected Discharge Plan:  Home w Home Health Services  In-House Referral:  NA  Discharge planning Services  CM Consult  Post Acute Care Choice:  Home Health, Durable Medical Equipment Choice offered to:  Patient, Adult Children  DME Arranged:  Walker rolling, 3-N-1, Shower stool DME Agency:  [facility]  HH Arranged:  RN, Disease Management, PT HH Agency:  [facility]  Status of Service:  Completed, signed off  If discussed at Long Length of Stay Meetings, dates discussed:    Additional Comments:  Gala Lewandowsky, RN 05/23/2018, 3:20 PM

## 2018-05-23 NOTE — Progress Notes (Addendum)
PROGRESS NOTE    Elizabeth [name]  ZOX:[id number] DOB: [birth year] DOA: 05/17/2018 PCP: Kari Baars, MD   Brief Narrative:  HPI on 05/17/2018 by Dr. Warren Lacy (PCCM) This is a 77 year old with a history of peripheral vascular disease who had the sudden onset of severe back pain this morning with radiation around both flanks just below the ribs.  The pain was noncolicky and nonpleuritic.  There is  no associated chest pain or shortness of breath.  She was making coffee at the time of onset there have been no unusual exertion.  A CTA of the chest is shown to ulcerations of the descending aorta and she was referred from [place] to here for definitive care.  Interim history Admitted for descending thoracic aortic aneurysm and is currently being followed by vascular surgery.  Also noted to have an NSTEMI followed by cardiology. 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 Randie Heinz today, no surgical intervention this hospitalization. If the CTA is stable, and BP remains stable, may be discharged home.  NSTEMI -Cardiology consulted and appreciated, troponin peaked to 7 -Left heart catheterization on 05/20/2018 with total occlusions of the circumflex and RCA, single remaining conduit to supply LAD -Patient is not a PCI candidate as per cardiology -Cardiothoracic surgery consulted and appreciated, patient will be high risk -Continue aspirin, statin, metoprolol -heparin discontinued -Echocardiogram: EF 60 to 65%, hypokinesis of the mid apical inferior lateral myocardium.  Grade 1 diastolic dysfunction -Vein mapping ordered by surgery -Discussed surgery with patient, she does not feel that she wants to go through CABG surgery due to pain and risks.  -Plan for medical  management per cardiology  Essential hypertension -Patient presented with hypertensive urgency and required drips for blood pressure control -Currently blood pressure is stable, continue Norvasc, Lopressor  Chronic left hip pain -Continue OxyContin, Tylenol as needed  History of nephrolithiasis -Continue potassium citrate  Former tobacco abuser -PFTs have been ordered  PAD -Status post left common iliac artery stenting -CTA neck showed atheromatous change along great vessel origins, likely flow reducing involving the left subclavian -Vascular surgery following -Continue medications as above  Physical deconditioning -PT consulted, recommended HH -Case management consulted  Goals of care/Code status -Dicussed code status with patient, she prefers to be DNR, no compressions or intubation -she wishes to be comfortable and with her family  DVT Prophylaxis  Heparin drip discontinued, placed on lovenox  Code Status: DNR  Family Communication: None at bedside  Disposition Plan: Admitted. Pending CTA. Suspect home with home health in 24 hours  Consultants PCCM Cardiology Vascular surgery Cardiothoracic surgery  Procedures  Echocardiogram Carotid Doppler Left heart catheterization  Antibiotics   Anti-infectives (From admission, onward)   None      Subjective:   Elizabeth [name] seen and examined today.  Continues to have hip pain. Denies current chest pain, shortness of breath, abdominal pain, N/V/D/C.  Objective:   Vitals:   05/22/18 2023 05/23/18 0500 05/23/18 1017 05/23/18 1114  BP: 140/66 130/69 (!) 163/68 136/69  Pulse: 94 82 (!) 108 (!) 106  Resp: 14 13  20   Temp: 98.3 F (36.8 C) 99 F (37.2 C)    TempSrc: Oral Oral    SpO2: 94% 93%  100%  Weight:  60.3 kg    Height:  Intake/Output Summary (Last 24 hours) at 05/23/2018 1405 Last data filed at 05/23/2018 1151 Gross per 24 hour  Intake 3085.6 ml  Output 1450 ml  Net 1635.6 ml   Filed  Weights   05/21/18 0539 05/22/18 0500 05/23/18 0500  Weight: 59.7 kg 61.6 kg 60.3 kg   Exam  General: Well developed, well nourished, NAD, appears stated age  HEENT: NCAT, mucous membranes moist.   Neck: Supple  Cardiovascular: S1 S2 auscultated, 2/6 SEM, RRR  Respiratory: Clear to auscultation bilaterally with equal chest rise  Abdomen: Soft, nontender, nondistended, + bowel sounds  Extremities: warm dry without cyanosis clubbing or edema  Neuro: AAOx3, nonfocal  Psych: Normal affect and demeanor   Data Reviewed: I have personally reviewed following labs and imaging studies  CBC: Recent Labs  Lab 05/18/18 0617 05/20/18 0249 05/21/18 0430 05/22/18 0614 05/23/18 0458  WBC 6.9 4.2 5.2 4.7 5.1  HGB 13.9 11.5* 12.0 11.6* 11.7*  HCT 43.0 37.0 37.2 35.7* 36.0  MCV 102.1* 104.8* 102.8* 102.3* 101.7*  PLT 211 166 173 186 214   Basic Metabolic Panel: Recent Labs  Lab 05/18/18 0617 05/19/18 1011 05/20/18 0249 05/21/18 0430 05/22/18 0614  NA 139 139 140 139 138  K 3.4* 4.1 3.9 3.8 4.0  CL 104 104 106 104 102  CO2 24 27 27 26 28   GLUCOSE 111* 112* 101* 93 101*  BUN 9 12 12 14 10   CREATININE 0.80 0.81 0.85 0.84 0.76  CALCIUM 9.1 8.9 8.4* 9.0 8.8*  MG 1.8 2.1 1.9  --   --   PHOS  --  2.4* 3.4 3.1  --    GFR: Estimated Creatinine Clearance: 50.6 mL/min (by C-G formula based on SCr of 0.76 mg/dL). Liver Function Tests: Recent Labs  Lab 05/17/18 1031 05/18/18 0617 05/19/18 1011 05/20/18 0249 05/21/18 0430  AST 40 28  --   --   --   ALT 16 15  --   --   --   ALKPHOS 86 62  --   --   --   BILITOT 1.1 0.9  --   --   --   PROT 8.0 7.0  --   --   --   ALBUMIN 4.6 3.6 3.3* 2.6* 2.8*   Recent Labs  Lab 05/17/18 1031 05/17/18 1621  LIPASE 41 29  AMYLASE  --  132*   No results for input(s): AMMONIA in the last 168 hours. Coagulation Profile: Recent Labs  Lab 05/17/18 1648  INR 0.98   Cardiac Enzymes: Recent Labs  Lab 05/17/18 1621 05/17/18 2110  05/18/18 0617 05/19/18 1011 05/21/18 0430  TROPONINI <0.03 0.03* 0.48* 7.50* 4.14*   BNP (last 3 results) No results for input(s): PROBNP in the last 8760 hours. HbA1C: No results for input(s): HGBA1C in the last 72 hours. CBG: No results for input(s): GLUCAP in the last 168 hours. Lipid Profile: Recent Labs    05/21/18 0430  TRIG 153*   Thyroid Function Tests: No results for input(s): TSH, T4TOTAL, FREET4, T3FREE, THYROIDAB in the last 72 hours. Anemia Panel: No results for input(s): VITAMINB12, FOLATE, FERRITIN, TIBC, IRON, RETICCTPCT in the last 72 hours. Urine analysis:    Component Value Date/Time   COLORURINE YELLOW (A) 05/17/2018 1032   APPEARANCEUR CLEAR (A) 05/17/2018 1032   LABSPEC 1.017 05/17/2018 1032   PHURINE 7.0 05/17/2018 1032   GLUCOSEU NEGATIVE 05/17/2018 1032   HGBUR NEGATIVE 05/17/2018 1032   BILIRUBINUR NEGATIVE 05/17/2018 1032   KETONESUR NEGATIVE 05/17/2018 1032  PROTEINUR 30 (A) 05/17/2018 1032   UROBILINOGEN 0.2 01/01/2011 2129   NITRITE NEGATIVE 05/17/2018 1032   LEUKOCYTESUR NEGATIVE 05/17/2018 1032   Sepsis Labs: @LABRCNTIP (procalcitonin:4,lacticidven:4)  ) Recent Results (from the past 240 hour(s))  MRSA PCR Screening     Status: None   Collection Time: 05/17/18  5:30 PM  Result Value Ref Range Status   MRSA by PCR NEGATIVE NEGATIVE Final    Comment:        The GeneXpert MRSA Assay (FDA approved for NASAL specimens only), is one component of a comprehensive MRSA colonization surveillance program. It is not intended to diagnose MRSA infection nor to guide or monitor treatment for MRSA infections. Performed at [facility], [address] [place], [place] [address]   Surgical pcr screen     Status: None   Collection Time: 05/20/18  6:10 PM  Result Value Ref Range Status   MRSA, PCR NEGATIVE NEGATIVE Final   Staphylococcus aureus NEGATIVE NEGATIVE Final    Comment: (NOTE) The Xpert SA Assay (FDA approved for NASAL  specimens in patients 35 years of age and older), is one component of a comprehensive surveillance program. It is not intended to diagnose infection nor to guide or monitor treatment. Performed at [facility], [address], [place], [place] [address]       Radiology Studies: Dg Chest 2 View  Result Date: 05/22/2018 CLINICAL DATA:  Recent myocardial infarction EXAM: CHEST - 2 VIEW COMPARISON:  05/17/2018 FINDINGS: Cardiac shadow is at the upper limits of normal in size. The lungs are well aerated bilaterally. Calcified granuloma is noted in the left apex stable from the prior exam. New left retrocardiac density is noted with associated effusion. No acute bony abnormality is noted. IMPRESSION: New left retrocardiac atelectasis with associated left-sided effusion. These changes somewhat are similar to the recent CTA of the neck. Electronically Signed   By: Alcide Clever M.D.   On: 05/22/2018 09:52   Dg Chest Port 1 View  Result Date: 05/23/2018 CLINICAL DATA:  Pleural effusion. EXAM: PORTABLE CHEST 1 VIEW COMPARISON:  Chest x-ray dated 05/22/2018. Chest CT dated 05/17/2018. FINDINGS: Stable veiled opacity at the LEFT lung base, compatible with mild atelectasis and/or small layering pleural effusion. RIGHT lung remains clear. No pneumothorax seen. Heart size is stable. Increased prominence of the LEFT perihilar shadow. IMPRESSION: 1. Increased prominence of the LEFT perihilar shadow, which could indicate increased thickening of the LEFT pulmonary artery walls due to underlying vasculitis and/or increasing fluid/hematoma adjacent to the central pulmonary arteries as suggested on recent chest CT angiogram of 05/17/2018. Recommend repeat chest CT angiogram for further characterization. 2. Stable veiled opacity at the LEFT lung base, compatible with small layering pleural effusion and/or atelectasis. 3.  Aortic Atherosclerosis (ICD10-I70.0). These results will be called to the ordering clinician  or representative by the Radiologist Assistant, and communication documented in the PACS or zVision Dashboard. Electronically Signed   By: Bary Richard M.D.   On: 05/23/2018 08:49     Scheduled Meds: . amLODipine  10 mg Oral Daily  . aspirin EC  81 mg Oral Daily  . docusate sodium  100 mg Oral Daily  . famotidine  20 mg Oral BID  . isosorbide mononitrate  15 mg Oral Daily  . mouth rinse  15 mL Mouth Rinse BID  . metoprolol tartrate  50 mg Oral BID  . oxyCODONE  10 mg Oral Q12H  . polyethylene glycol  17 g Oral Daily  .  potassium citrate  10 mEq Oral BID WC  . rosuvastatin  20 mg Oral q1800  . sodium chloride flush  3 mL Intravenous Q12H   Continuous Infusions: . sodium chloride    . lactated ringers 182 mL/hr at 05/19/18 1500     LOS: 6 days   Time Spent in minutes   45 minutes  Elizabeth [name] D.O. on 05/23/2018 at 2:05 PM  Between 7am to 7pm - Please see pager noted on [email or web address]  After 7pm go to [email or web address]  And look for the night coverage person covering for me after hours  [facility] Office  [phone]

## 2018-05-23 NOTE — Progress Notes (Addendum)
Progress Note  Patient Name: Elizabeth [name] Date of Encounter: 05/23/2018  Primary Cardiologist: Dr. Allyson Sabal  Subjective   No chest or back pain. Denies Dyspnea.   Inpatient Medications    Scheduled Meds: . amLODipine  10 mg Oral Daily  . aspirin EC  81 mg Oral Daily  . docusate sodium  100 mg Oral Daily  . famotidine  20 mg Oral BID  . isosorbide mononitrate  15 mg Oral Daily  . mouth rinse  15 mL Mouth Rinse BID  . metoprolol tartrate  50 mg Oral BID  . oxyCODONE  10 mg Oral Q12H  . polyethylene glycol  17 g Oral Daily  . potassium citrate  10 mEq Oral BID WC  . rosuvastatin  20 mg Oral q1800  . sodium chloride flush  3 mL Intravenous Q12H   Continuous Infusions: . sodium chloride    . lactated ringers 182 mL/hr at 05/19/18 1500   PRN Meds: sodium chloride, acetaminophen, fentaNYL (SUBLIMAZE) injection, HYDROcodone-acetaminophen, metoprolol tartrate, ondansetron (ZOFRAN) IV, sodium chloride flush   Vital Signs    Vitals:   05/22/18 0913 05/22/18 1438 05/22/18 2023 05/23/18 0500  BP: (!) 153/75 111/63 140/66 130/69  Pulse: (!) 108 96 94 82  Resp:  13 14 13   Temp:  98.6 F (37 C) 98.3 F (36.8 C) 99 F (37.2 C)  TempSrc:  Oral Oral Oral  SpO2:  95% 94% 93%  Weight:    60.3 kg  Height:        Intake/Output Summary (Last 24 hours) at 05/23/2018 0857 Last data filed at 05/23/2018 0600 Gross per 24 hour  Intake 3145.6 ml  Output 950 ml  Net 2195.6 ml   Filed Weights   05/21/18 0539 05/22/18 0500 05/23/18 0500  Weight: 59.7 kg 61.6 kg 60.3 kg    Telemetry    SR at 70-90s - Personally Reviewed  ECG    N/A  Physical Exam   GEN: Thin frail elderly female in no acute distress.   Neck: No JVD Cardiac: RRR, Systolic murmurs, rubs, or gallops.  Respiratory: Clear to auscultation bilaterally. GI: Soft, nontender, non-distended  MS: No edema; No deformity. Neuro:  Nonfocal  Psych: Normal affect   Labs    Chemistry Recent Labs  Lab  05/17/18 1031  05/18/18 0617 05/19/18 1011 05/20/18 0249 05/21/18 0430 05/22/18 0614  NA 141   < > 139 139 140 139 138  K 5.6*   < > 3.4* 4.1 3.9 3.8 4.0  CL 103   < > 104 104 106 104 102  CO2 28   < > 24 27 27 26 28   GLUCOSE 125*   < > 111* 112* 101* 93 101*  BUN 16   < > 9 12 12 14 10   CREATININE 0.96   < > 0.80 0.81 0.85 0.84 0.76  CALCIUM 9.6   < > 9.1 8.9 8.4* 9.0 8.8*  PROT 8.0  --  7.0  --   --   --   --   ALBUMIN 4.6  --  3.6 3.3* 2.6* 2.8*  --   AST 40  --  28  --   --   --   --   ALT 16  --  15  --   --   --   --   ALKPHOS 86  --  62  --   --   --   --   BILITOT 1.1  --  0.9  --   --   --   --  GFRNONAA 56*   < > >60 >60 >60 >60 >60  GFRAA >60   < > >60 >60 >60 >60 >60  ANIONGAP 10   < > 11 8 7 9 8    < > = values in this interval not displayed.     Hematology Recent Labs  Lab 05/21/18 0430 05/22/18 0614 05/23/18 0458  WBC 5.2 4.7 5.1  RBC 3.62* 3.49* 3.54*  HGB 12.0 11.6* 11.7*  HCT 37.2 35.7* 36.0  MCV 102.8* 102.3* 101.7*  MCH 33.1 33.2 33.1  MCHC 32.3 32.5 32.5  RDW 12.4 12.2 12.1  PLT 173 186 214    Cardiac Enzymes Recent Labs  Lab 05/17/18 2110 05/18/18 0617 05/19/18 1011 05/21/18 0430  TROPONINI 0.03* 0.48* 7.50* 4.14*    Recent Labs  Lab 05/17/18 1631  TROPIPOC 0.00     BNPNo results for input(s): BNP, PROBNP in the last 168 hours.   DDimer No results for input(s): DDIMER in the last 168 hours.   Radiology    Dg Chest 2 View  Result Date: 05/22/2018 CLINICAL DATA:  Recent myocardial infarction EXAM: CHEST - 2 VIEW COMPARISON:  05/17/2018 FINDINGS: Cardiac shadow is at the upper limits of normal in size. The lungs are well aerated bilaterally. Calcified granuloma is noted in the left apex stable from the prior exam. New left retrocardiac density is noted with associated effusion. No acute bony abnormality is noted. IMPRESSION: New left retrocardiac atelectasis with associated left-sided effusion. These changes somewhat are similar  to the recent CTA of the neck. Electronically Signed   By: Alcide Clever M.D.   On: 05/22/2018 09:52   Ct Angio Neck W Or Wo Contrast  Result Date: 05/21/2018 CLINICAL DATA:  Arterial stricture and occlusion. Follow-up stenosis. Non STEMI. Back pain. EXAM: CT ANGIOGRAPHY NECK TECHNIQUE: Multidetector CT imaging of the neck was performed using the standard protocol during bolus administration of intravenous contrast. Multiplanar CT image reconstructions and MIPs were obtained to evaluate the vascular anatomy. Carotid stenosis measurements (when applicable) are obtained utilizing NASCET criteria, using the distal internal carotid diameter as the denominator. CONTRAST:  90mL ISOVUE-370 IOPAMIDOL (ISOVUE-370) INJECTION 76% COMPARISON:  CTA neck 02/27/2012.  CT chest 05/17/2018. FINDINGS: Aortic arch: Direct origin of small LEFT vertebral from the arch. Extreme calcification at the LEFT subclavian origin, suspected high-grade stenosis. Nonstenotic calcification at the origin of the LEFT common carotid, innominate, RIGHT common carotid, and RIGHT subclavian. Posterior penetrating ulcers of the thoracic aorta are observed. These measure 4 x 5 mm as seen on series 5, image 96, and 6 x 11 mm on series 5, image 109. Intramural hematoma redemonstrated. These appears slightly increased from prior recent chest CT. In addition, there is a new LEFT pleural effusion, intermediate Hounsfield attenuation 15-20 HU, which was not present on 05/17/2018. Right carotid system: No evidence of dissection, stenosis (50% or greater) or occlusion. Reportedly, the patient is status post endarterectomy. Left carotid system: No evidence of dissection, stenosis (50% or greater) or occlusion. Reportedly, the patient is status post endarterectomy. Vertebral arteries: Dominant RIGHT vertebral. Ostial stenosis calcification of the RIGHT vertebral estimated 50%. No similar ostial stenosis of the LEFT vertebral. No clear stenosis in the neck. LEFT  vertebral contributes directly to the LEFT PICA. Skeleton: Spondylosis. Other neck: No masses. Upper chest: New LEFT pleural effusion. IMPRESSION: No significant carotid stenosis status post reportedly BILATERAL carotid endarterectomy. Marked improvement compared with 02/27/2012. 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  In addition, there is a new LEFT pleural effusion, of intermediate attenuation. Atheromatous change all great vessel origins, likely flow reducing involving the LEFT subclavian; see discussion above. These results were called by telephone at the time of interpretation on 05/21/2018 at 1:18 pm to Dr. Randie Heinz, Who verbally acknowledged these results. Electronically Signed   By: Elsie Stain M.D.   On: 05/21/2018 13:23   Dg Chest Port 1 View  Result Date: 05/23/2018 CLINICAL DATA:  Pleural effusion. EXAM: PORTABLE CHEST 1 VIEW COMPARISON:  Chest x-ray dated 05/22/2018. Chest CT dated 05/17/2018. FINDINGS: Stable veiled opacity at the LEFT lung base, compatible with mild atelectasis and/or small layering pleural effusion. RIGHT lung remains clear. No pneumothorax seen. Heart size is stable. Increased prominence of the LEFT perihilar shadow. IMPRESSION: 1. Increased prominence of the LEFT perihilar shadow, which could indicate increased thickening of the LEFT pulmonary artery walls due to underlying vasculitis and/or increasing fluid/hematoma adjacent to the central pulmonary arteries as suggested on recent chest CT angiogram of 05/17/2018. Recommend repeat chest CT angiogram for further characterization. 2. Stable veiled opacity at the LEFT lung base, compatible with small layering pleural effusion and/or atelectasis. 3.  Aortic Atherosclerosis (ICD10-I70.0). These results will be called to the ordering clinician or representative by the Radiologist Assistant, and communication documented in the PACS or zVision Dashboard. Electronically  Signed   By: Bary Richard M.D.   On: 05/23/2018 08:49    Cardiac Studies   Cath 05/19/18  Total occlusion of the mid to distal RCA. Distal RCA fills via well-formed collaterals around the left ventricular apex from the LAD.  Total occlusion of a relatively small circumflex. Minimal collaterals are noted.  Ostial 60% left main with pressure damping noted during engagement. At least 60% distal left main is noted.  Heavily calcified 80% proximal LAD followed by eccentric calcified slitlike 70% mid LAD stenosis.  Normal LV function with EF 55%.  RECOMMENDATIONS:   The patient has a single remaining conduit which is the left main coronary artery supplying the LAD. Both the circumflex and right coronary are occluded. The distal left main, proximal LAD is aneurysmal and heavily calcified. The LAD supplies well-formed collaterals to the PDA.  Recommend evaluation for potential surgical revascularization.  No appealing interventional options exist.   Echo 05/20/18: Study Conclusions - Left ventricle: The cavity size was normal. Wall thickness was normal. Systolic function was normal. The estimated ejection fraction was in the range of 60% to 65%. Hypokinesis of the mid-apicalinferolateral myocardium; consistent with ischemia in the distribution of the right coronary or left circumflex coronary artery. Doppler parameters are consistent with abnormal left ventricular relaxation (grade 1 diastolic dysfunction). - Mitral valve: There was mild regurgitation.   Patient Profile     77 y.o. female with a hx of peripheral vascular disease status post left common iliac artery stenting followed by [place] vascular vein, bilateral carotid endarterectomy, hypertension and abdominal aortic aneurysmwho is being seen for NSTEMI. Cath showed severe multi-vessel disease. CABG evaluation ongoing.  Assessment & Plan    1) Non-STEMI: NSTEMI on 10/12, In setting of acute  ulceration of thoracic aorta. Troponin peaked to 7. Severe multi-vessel disease on cath as above.  ECHO with EF 60-65%, Hypokinesis of mid-apicalinferolateral myocardium, and G1DD. Poor anatomy for PCI intervention; surgery felt to be best strategy for revascularization. Seen by Dr. Donata Clay and felt medical management best options currently. Heparin and nitro gtt discontinued on 10/16. No chest pain. Back pain improving.  - Continue ASA,  Rosuvastatin 20mg  Daily, and Metoprolol 50mg  BID, IMDUR  2) Acuteulceration with intramural hematoma of the thoracic aorta. - Pain stable today, BP 110s systolic this AM. Repeat scan planned today.   3)Carotid artery disease: S/P bilateral endarterectomy. Doppler showed: Right ICA/CEA patent; 80-99% Left ICA stenosis. No significant stenosis of CTA Neck. - On Plavix  4)PAD: s/pleft common iliac artery stenting. CTA neck showed Atheromatous change  Of all great vessel origins, likely flow reducing involving the LEFT subclavian. Vascular Surgery is following.  5) HTN: BP in 130-140s Since last night.  - Continue amlodipine and IMDUR. May consider increasing BB.    4. AAA: 4 cm, Slightly larger than previous.   For questions or updates, please contact [facility] Please consult [email or web address] for contact info under        Signed, Manson Passey, PA  05/23/2018, 8:57 AM    Agree with note by Chelsea Aus PA-C  Off IV heparin for the last 2 days.  No chest or back pain.  Cardiac stable.  Plans are for medical therapy of her CAD at this time.  Dr. Randie Heinz is following her vascular issues including her penetrating thoracic aortic ulcer and sub-/intramural hematoma.  She is stable for discharge today from a cardiac point of view if Dr. Randie Heinz feels she is stable from a vascular point of view.  I will see her back in the office in several weeks.  TOC 7 with Genene Churn, M.D., FACP, [facility], [facility]  [facility] [address] [place], [place]  [address]  [phone] 05/23/2018 10:18 AM

## 2018-05-23 NOTE — Progress Notes (Signed)
Report called to RN by radiology.  Chest Xray=Increased prominence of the LEFT perihilar shadow, which could indicate increased thickening of the LEFT pulmonary artery walls due to underlying vasculitis and/or increasing fluid/hematoma adjacent to the central pulmonary arteries as suggested on recent chest CTangiogram of 05/17/2018. Recommend repeat chest CT angiogram for further characterization.  Report called to Dr Catha Gosselin.

## 2018-05-23 NOTE — Progress Notes (Signed)
CARDIAC REHAB PHASE I   Pt just ambulated in hallway with PT, maintaining sats on room air. Pt states she feels better walking today than she did yesterday. Pt given MI book. Reviewed importance of ambulating as able, and encouraged safety. Will refer to [facility] [place], pt not able to attend at this time. Family at bedside.  [id number] Reynold Bowen, RN BSN 05/23/2018 11:40 AM

## 2018-05-24 ENCOUNTER — Inpatient Hospital Stay (HOSPITAL_COMMUNITY): Payer: Medicare Other

## 2018-05-24 LAB — CBC
HCT: 37 % (ref 36.0–46.0)
Hemoglobin: 12.2 g/dL (ref 12.0–15.0)
MCH: 33.7 pg (ref 26.0–34.0)
MCHC: 33 g/dL (ref 30.0–36.0)
MCV: 102.2 fL — ABNORMAL HIGH (ref 80.0–100.0)
PLATELETS: 240 10*3/uL (ref 150–400)
RBC: 3.62 MIL/uL — AB (ref 3.87–5.11)
RDW: 12.1 % (ref 11.5–15.5)
WBC: 6.1 10*3/uL (ref 4.0–10.5)
nRBC: 0 % (ref 0.0–0.2)

## 2018-05-24 LAB — TRIGLYCERIDES: TRIGLYCERIDES: 200 mg/dL — AB (ref <150)

## 2018-05-24 NOTE — Progress Notes (Signed)
Progress Note  Patient Name: Elizabeth [name] Date of Encounter: 05/24/2018  Primary Cardiologist: Dr. Allyson Sabal  Subjective   Patient feeling okay today. He back pain remains stable at 2/10. She denies chest pain or shortness of breath.    Inpatient Medications    Scheduled Meds: . amLODipine  10 mg Oral Daily  . aspirin EC  81 mg Oral Daily  . docusate sodium  100 mg Oral Daily  . enoxaparin (LOVENOX) injection  40 mg Subcutaneous Q24H  . famotidine  20 mg Oral BID  . iopamidol      . isosorbide mononitrate  15 mg Oral Daily  . mouth rinse  15 mL Mouth Rinse BID  . metoprolol tartrate  50 mg Oral BID  . oxyCODONE  10 mg Oral Q12H  . polyethylene glycol  17 g Oral Daily  . potassium citrate  10 mEq Oral BID WC  . rosuvastatin  20 mg Oral q1800  . sodium chloride flush  3 mL Intravenous Q12H   Continuous Infusions: . sodium chloride    . lactated ringers 182 mL/hr at 05/19/18 1500   PRN Meds: sodium chloride, acetaminophen, fentaNYL (SUBLIMAZE) injection, HYDROcodone-acetaminophen, metoprolol tartrate, ondansetron (ZOFRAN) IV, sodium chloride flush   Vital Signs    Vitals:   05/23/18 1114 05/23/18 1458 05/23/18 2119 05/24/18 0548  BP: 136/69 132/65 137/67 134/65  Pulse: (!) 106 92 93 (!) 111  Resp: 20  13 (!) 25  Temp:  100 F (37.8 C) 98.7 F (37.1 C) 98.7 F (37.1 C)  TempSrc:  Oral Oral Oral  SpO2: 100% 94%  95%  Weight:    61.1 kg  Height:        Intake/Output Summary (Last 24 hours) at 05/24/2018 0834 Last data filed at 05/24/2018 0636 Gross per 24 hour  Intake 500 ml  Output 900 ml  Net -400 ml   Filed Weights   05/22/18 0500 05/23/18 0500 05/24/18 0548  Weight: 61.6 kg 60.3 kg 61.1 kg    Telemetry    Sinus Rhythm, Episode of Sinus Tach to 103  - Personally Reviewed  ECG   None Today  Physical Exam   GEN: No acute distress.   Neck: No JVD, Bilateral Bruit  Cardiac: RRR; Systolic murmur; No rubs, or gallops.  Respiratory: Clear to  auscultation bilaterally. GI: Soft, nontender, non-distended  MS: No edema; No deformity; Clean R ulnar cath site, pulse intact Neuro:  Nonfocal  Psych: Normal affect   Labs    Chemistry Recent Labs  Lab 05/17/18 1031  05/18/18 0617 05/19/18 1011 05/20/18 0249 05/21/18 0430 05/22/18 0614  NA 141   < > 139 139 140 139 138  K 5.6*   < > 3.4* 4.1 3.9 3.8 4.0  CL 103   < > 104 104 106 104 102  CO2 28   < > 24 27 27 26 28   GLUCOSE 125*   < > 111* 112* 101* 93 101*  BUN 16   < > 9 12 12 14 10   CREATININE 0.96   < > 0.80 0.81 0.85 0.84 0.76  CALCIUM 9.6   < > 9.1 8.9 8.4* 9.0 8.8*  PROT 8.0  --  7.0  --   --   --   --   ALBUMIN 4.6  --  3.6 3.3* 2.6* 2.8*  --   AST 40  --  28  --   --   --   --   ALT 16  --  15  --   --   --   --   ALKPHOS 86  --  62  --   --   --   --   BILITOT 1.1  --  0.9  --   --   --   --   GFRNONAA 56*   < > >60 >60 >60 >60 >60  GFRAA >60   < > >60 >60 >60 >60 >60  ANIONGAP 10   < > 11 8 7 9 8    < > = values in this interval not displayed.     Hematology Recent Labs  Lab 05/22/18 0614 05/23/18 0458 05/24/18 0558  WBC 4.7 5.1 6.1  RBC 3.49* 3.54* 3.62*  HGB 11.6* 11.7* 12.2  HCT 35.7* 36.0 37.0  MCV 102.3* 101.7* 102.2*  MCH 33.2 33.1 33.7  MCHC 32.5 32.5 33.0  RDW 12.2 12.1 12.1  PLT 186 214 240    Cardiac Enzymes Recent Labs  Lab 05/17/18 2110 05/18/18 0617 05/19/18 1011 05/21/18 0430  TROPONINI 0.03* 0.48* 7.50* 4.14*    Recent Labs  Lab 05/17/18 1631  TROPIPOC 0.00     BNPNo results for input(s): BNP, PROBNP in the last 168 hours.   DDimer No results for input(s): DDIMER in the last 168 hours.   Radiology    Dg Chest Port 1 View  Result Date: 05/23/2018 CLINICAL DATA:  Pleural effusion. EXAM: PORTABLE CHEST 1 VIEW COMPARISON:  Chest x-ray dated 05/22/2018. Chest CT dated 05/17/2018. FINDINGS: Stable veiled opacity at the LEFT lung base, compatible with mild atelectasis and/or small layering pleural effusion. RIGHT lung  remains clear. No pneumothorax seen. Heart size is stable. Increased prominence of the LEFT perihilar shadow. IMPRESSION: 1. Increased prominence of the LEFT perihilar shadow, which could indicate increased thickening of the LEFT pulmonary artery walls due to underlying vasculitis and/or increasing fluid/hematoma adjacent to the central pulmonary arteries as suggested on recent chest CT angiogram of 05/17/2018. Recommend repeat chest CT angiogram for further characterization. 2. Stable veiled opacity at the LEFT lung base, compatible with small layering pleural effusion and/or atelectasis. 3.  Aortic Atherosclerosis (ICD10-I70.0). These results will be called to the ordering clinician or representative by the Radiologist Assistant, and communication documented in the PACS or zVision Dashboard. Electronically Signed   By: Bary Richard M.D.   On: 05/23/2018 08:49   Ct Angio Chest/abd/pel For Dissection W And/or W/wo  Result Date: 05/24/2018 CLINICAL DATA:  77 year old with thoracic aortic disease and pre planning CT examination. 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 EXAM: CT ANGIOGRAPHY CHEST, ABDOMEN AND PELVIS TECHNIQUE: Multidetector CT imaging through the chest, abdomen and pelvis was performed using the standard protocol during bolus administration of intravenous contrast. Multiplanar reconstructed images and MIPs were obtained and reviewed to evaluate the vascular anatomy. CONTRAST:  ISOVUE-370 IOPAMIDOL (ISOVUE-370) INJECTION 76% COMPARISON:  05/17/2018 FINDINGS: CTA CHEST FINDINGS Cardiovascular: Again noted is extensive atherosclerotic disease involving the thoracic aorta. Again noted is a penetrating ulcer along the proximal descending thoracic aorta on sequence 8, image 40. This penetrating ulcer is slightly more conspicuous than it was on the previous examination. There is a the new large penetrating ulcer along the posterior proximal/mid descending thoracic aorta on sequence  8, image 51 measuring 1 cm in diameter. The size of the thoracic aorta is difficult to measure because there is now left pleural fluid. However, the size of the descending thoracic aorta has not significantly changed. Interestingly, the pleural fluid appears to markedly increase  between the pre and postcontrast studies which is roughly 2 minutes. Pleural fluid adjacent to the aorta is low density. The mid descending thoracic aorta roughly measures 3.9 cm and minimally changed from the study on 05/17/2018. Large penetrating ulcer along the posterior distal descending thoracic aorta measuring roughly 1.1 cm in diameter previously measuring 0.8 cm. Again noted is evidence for intramural hematoma within the distal descending thoracic aorta near the hiatus. Findings are concerning for evolution of the intramural hematoma and penetrating ulcers into a developing dissection involving the descending thoracic aorta. Main pulmonary arteries are patent. Normal caliber of the aortic root and ascending thoracic aorta without dissection flap. Great vessels are patent. Extensive atherosclerotic disease involving the proximal left subclavian artery with areas of stenosis. Atherosclerotic disease involving the origin of left common carotid artery which is difficult to evaluate due to artifact from the adjacent contrast in the venous system. Previously, there was stranding or edema around the main pulmonary arteries which is less conspicuous on this examination. Heart size is normal without significant pericardial fluid. Coronary artery calcifications. Mediastinum/Nodes: No chest lymphadenopathy. Thyroid tissue is unremarkable. Lungs/Pleura: Trachea and mainstem bronchi are patent. New small left pleural effusion. Apparently, the left pleural fluid increases between the pre and post contrast images on this study. This is approximately a 2 minute interval. The fluid in left pleural space appears to be low-density rather than blood  attenuation. Stable scarring at the lung apices. Ground-glass densities along the medial right upper lobe may be related to atelectasis or adjacent air trapping. Compressive atelectasis in the left lower lobe associated with the left pleural effusion. 6 mm calcified granuloma in the left upper lobe. Small pleural-based densities in the upper lobes bilaterally probably postinflammatory. Musculoskeletal: No acute bone abnormality. Review of the MIP images confirms the above findings. CTA ABDOMEN AND PELVIS FINDINGS VASCULAR Aorta: Extensive atherosclerotic disease in the aorta with an infrarenal abdominal aortic aneurysm. Infrarenal abdominal aorta aneurysm measures 4 cm and stable. No evidence for an abdominal aortic dissection or intramural hematoma in the abdominal aorta. Celiac: Celiac trunk is patent with mild-to-moderate stenosis at the origin due to mixed plaque. Left gastric artery appears to originate directly from the aorta just above the celiac trunk. SMA: Atherosclerotic disease at the origin of the SMA with at least mild stenosis. Renals: Right renal artery is patent but small. There appears to be significant disease and stenosis near the origin. The right kidney is atrophic. Left renal artery is patent with extensive calcified plaque and at least mild stenosis. IMA: IMA is patent. Inflow: Aneurysm terminates well above the aortic bifurcation. Right common, internal and external iliac arteries are patent. Iliac arteries are small. Right external iliac artery roughly measures 6 mm in greatest diameter. Left iliac arteries are patent with a patent stent involving the left common and left external iliac artery. Left internal iliac artery is patent. The left external iliac artery is small measuring roughly 5 mm. Common femoral arteries are patent bilaterally but small. Proximal femoral arteries are patent. Veins: No obvious venous abnormality within the limitations of this arterial phase study. Review of the  MIP images confirms the above findings. NON-VASCULAR Hepatobiliary: Mild distention of the gallbladder without surrounding inflammatory changes. No acute abnormality to the liver. Pancreas: Unremarkable. No pancreatic ductal dilatation or surrounding inflammatory changes. Spleen: Normal in size without focal abnormality. Adrenals/Urinary Tract: Mild fullness in the right adrenal gland probably represents an adenoma or hyperplasia. Mild fullness of the left adrenal gland lateral limb  is unchanged. Atrophic right kidney. Tiny calcification in left kidney lower pole is suggestive for small stone adjacent to a cortical cyst. Minimal left perinephric stranding. Additional small stone in the left kidney upper pole region measuring roughly 4 mm. High-density material in the left renal collecting system is most compatible with contrast. Urinary bladder is decompressed and contains a small amount of contrast. Stomach/Bowel: Extensive diverticulosis in the sigmoid colon. Stomach is within normal limits. No evidence for bowel dilatation or obstruction. No evidence for focal bowel inflammation. Lymphatic: No significant lymph node enlargement in the abdomen or pelvis. Reproductive: There appears to be a small uterus. No gross abnormality to the uterus or adnexal structures. Other: Again noted is a large left femoral hernia containing fat. Negative for free fluid. No free air. Musculoskeletal: Again noted is a severe joint space narrowing and degenerative disease in the left hip joint. Remodeling of the left hip joint with extensive subchondral cyst formation. Findings are likely related to a posttraumatic changes with secondary severe degenerative disease. Stable compression deformity involving the T12 vertebral body. Review of the MIP images confirms the above findings. IMPRESSION: 1. Interval change in the descending thoracic aortic disease. New and enlarging penetrating ulcerations suggesting evolution from an intramural  hematoma into an aortic dissection. The size of the descending thoracic aorta has not significantly changed although somewhat difficult to measure due to the new left pleural fluid. 2. Development of a small to moderate sized left pleural effusion. The pleural fluid is very peculiar in that there appears to be significant increase in left pleural fluid DURING the CTA examination. Although the pleural fluid is low-density, this fluid may be related to the aortic disease. 3. No change in the abdominal aortic aneurysm measuring 4 cm. 4. Diffuse atherosclerotic disease throughout the chest, abdomen and pelvis. Again noted is a stenosis involving the left subclavian artery and left common carotid artery. Stenosis involving the visceral arteries as described. 5. Small but patent left iliac arteries with a patent left iliac artery stent. 6. Nonobstructive left renal calculi. 7. Large left femoral hernia containing fat. These results were called by telephone at the time of interpretation on 05/24/2018 at 7:43 am to Dr. Wyonia Hough, who verbally acknowledged these results. Electronically Signed   By: Richarda Overlie M.D.   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  Distal RCA fills via well-formed collaterals around the left ventricular apex from the LAD.  Total occlusion of a relatively small circumflex.  Minimal collaterals are noted.  Ostial 60% left main with pressure damping noted during engagement.  At least 60% distal left main is noted.  Heavily calcified 80% proximal LAD followed by eccentric calcified slitlike 70% mid LAD stenosis.  Normal LV function with EF 55%.  RECOMMENDATIONS:   The patient has a single remaining conduit which is the left main coronary artery supplying the LAD.  Both the circumflex and right coronary are occluded.  The distal left main, proximal LAD is aneurysmal and heavily calcified.  The LAD supplies well-formed  collaterals to the PDA.  Recommend evaluation for potential surgical revascularization.  No appealing interventional options exist.   Echo 05/20/18: Study Conclusions - Left ventricle: The cavity size was normal. Wall thickness was   normal. Systolic function was normal. The estimated ejection   fraction was in the range of 60% to 65%. Hypokinesis of the   mid-apicalinferolateral myocardium; consistent with ischemia in  the distribution of the right coronary or left circumflex   coronary artery. Doppler parameters are consistent with abnormal   left ventricular relaxation (grade 1 diastolic dysfunction). - Mitral valve: There was mild regurgitation.   Patient Profile     77 y.o. female with a hx of peripheral vascular disease status post left common iliac artery stenting followed by [place] vascular vein, bilateral carotid endarterectomy, hypertension and abdominal aortic aneurysm who is being seen for NSTEMI. Cath showed severe multi-vessel disease. CABG workup ongoing.  Assessment & Plan    1) Non-STEMI: 10/12, In setting of acute ulceration of thoracic aorta. Troponin peaked to 7. Severe multi-vessel disease on cath as above. Poor targets for PCI intervention discussed with patient. CT Surgery consulted and have begun their work up. ECHO with EF 60-65%, Hypokinesis of mid-apicalinferolateral myocardium, and G1DD. There is concern for surgical risk given patient's comorbid conditions. Patient is to work with cardiac rehab this week and under go further evaluation. Stable CVTS declined surgery medical Rx  - Continue  ASA, Rosuvastatin 20mg  Daily, and Metoprolol 50mg  BID and imdur    2) Acute ulceration with intramural hematoma of the thoracic aorta. - Pain stable today, seen by Dr Myra Gianotti this am plan for repeat CT in a few days keep in hospital   3) Carotid artery disease: S/P bilateral endarterectomy. Doppler showed: Right ICA/CEA patent; 80-99% Left ICA stenosis.  - On  Plavix  4) PAD: s/p left common iliac artery stenting  5) HTN: BP 120s Sytolic this AM - Continue amlodipine, metoprolol, Nitro gtt - No Cleviprex use since 10/13, Will D/C  4. AAA: Slightly larger than prior, now at 4cm.  5. Pleural effusion: left small by CXR yesterday f/u per primary service     For questions or updates, please contact [facility] Please consult [email or web address] for contact info under      Signed, Charlton Haws, MD  05/24/2018, 8:34 AM

## 2018-05-24 NOTE — Progress Notes (Signed)
PROGRESS NOTE    Elizabeth [name]  ZOX:[id number] DOB: May 08, 1941 DOA: 05/17/2018 PCP: Kari Baars, MD   Brief Narrative:  HPI on 05/17/2018 by Dr. Warren Lacy (PCCM) This is a 77 year old with a history of peripheral vascular disease who had the sudden onset of severe back pain this morning with radiation around both flanks just below the ribs.  The pain was noncolicky and nonpleuritic.  There is  no associated chest pain or shortness of breath.  She was making coffee at the time of onset there have been no unusual exertion.  A CTA of the chest is shown to ulcerations of the descending aorta and she was referred from [place] to here for definitive care.  Interim history Admitted for descending thoracic aortic aneurysm and is currently being followed by vascular surgery.  Also noted to have an NSTEMI followed by cardiology. 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  New and enlarging penetrating ulcerations suggesting evolution from an intramural hematoma and to an aortic dissection. -Vascular surgery would like to continue to monitor patient and obtain repeat CT scan in a few days. Continue to control BP.  NSTEMI -Cardiology consulted and appreciated, troponin peaked to 7 -Left heart catheterization on 05/20/2018 with total occlusions of the circumflex and RCA, single remaining conduit to supply LAD -Patient is not a PCI candidate as per cardiology -Cardiothoracic surgery consulted and appreciated, patient will be high risk -Continue aspirin, statin, metoprolol (will not restart plavix at this time given above findings) -heparin discontinued -Echocardiogram: EF 60 to 65%, hypokinesis of the mid apical inferior lateral  myocardium.  Grade 1 diastolic dysfunction -Vein mapping ordered by surgery -Discussed surgery with patient, she does not feel that she wants to go through CABG surgery due to pain and risks.  -Plan for medical management per cardiology  Pleural effusion -Patient noted to have development of a small to moderate sized left pleural effusion -Chest x-ray ordered for later today -Currently patient has no shortness of breath  Essential hypertension -Patient presented with hypertensive urgency and required drips for blood pressure control -Currently blood pressure is stable, continue Norvasc, Lopressor  Chronic left hip pain -Continue OxyContin, Tylenol as needed  History of nephrolithiasis -Continue potassium citrate  Former tobacco abuser -PFTs have been ordered  PAD -Status post left common iliac artery stenting -CTA neck showed atheromatous change along great vessel origins, likely flow reducing involving the left subclavian -Vascular surgery following -Continue medications as above  Physical deconditioning -PT consulted, recommended HH -Case management consulted  Goals of care/Code status -Dicussed code status with patient, she prefers to be DNR, no compressions or intubation -she wishes to be comfortable and with her family  DVT Prophylaxis  Heparin drip discontinued, placed on lovenox  Code Status: DNR  Family Communication: None at bedside  Disposition Plan: Admitted. Pending repeat CTA in a few days per vascular.  Consultants PCCM Cardiology Vascular surgery Cardiothoracic surgery  Procedures  Echocardiogram Carotid Doppler Left heart catheterization  Antibiotics   Anti-infectives (From admission, onward)   None      Subjective:   Elizabeth [name] seen and examined today.  Continues to have but states it is manageable, 2/10. Would like to go home but understand the need to be in the hospital. Denies current chest pain,  shortnes sof breath, abdominal  pain, N/V/D/C/. .  Objective:   Vitals:   05/23/18 2119 05/24/18 0548 05/24/18 0855 05/24/18 0900  BP: 137/67 134/65 (!) 134/59 (!) 134/59  Pulse: 93 (!) 111 (!) 105 91  Resp: 13 (!) 25  13  Temp: 98.7 F (37.1 C) 98.7 F (37.1 C)    TempSrc: Oral Oral    SpO2:  95%  100%  Weight:  61.1 kg    Height:        Intake/Output Summary (Last 24 hours) at 05/24/2018 1104 Last data filed at 05/24/2018 0859 Gross per 24 hour  Intake 563 ml  Output 900 ml  Net -337 ml   Filed Weights   05/22/18 0500 05/23/18 0500 05/24/18 0548  Weight: 61.6 kg 60.3 kg 61.1 kg   Exam  General: Well developed, well nourished, NAD, appears stated age  HEENT: NCAT, mucous membranes moist.   Neck: Suppl  Cardiovascular: S1 S2 auscultated, 2/6 SEM, RRR  Respiratory: Clear to auscultation bilaterally with equal chest rise  Abdomen: Soft, nontender, nondistended, + bowel sounds  Extremities: warm dry without cyanosis clubbing or edema  Neuro: AAOx3, nonfocal  Psych: Pleasant, appropriate mood and affect  Data Reviewed: I have personally reviewed following labs and imaging studies  CBC: Recent Labs  Lab 05/20/18 0249 05/21/18 0430 05/22/18 0614 05/23/18 0458 05/24/18 0558  WBC 4.2 5.2 4.7 5.1 6.1  HGB 11.5* 12.0 11.6* 11.7* 12.2  HCT 37.0 37.2 35.7* 36.0 37.0  MCV 104.8* 102.8* 102.3* 101.7* 102.2*  PLT 166 173 186 214 240   Basic Metabolic Panel: Recent Labs  Lab 05/18/18 0617 05/19/18 1011 05/20/18 0249 05/21/18 0430 05/22/18 0614  NA 139 139 140 139 138  K 3.4* 4.1 3.9 3.8 4.0  CL 104 104 106 104 102  CO2 24 27 27 26 28   GLUCOSE 111* 112* 101* 93 101*  BUN 9 12 12 14 10   CREATININE 0.80 0.81 0.85 0.84 0.76  CALCIUM 9.1 8.9 8.4* 9.0 8.8*  MG 1.8 2.1 1.9  --   --   PHOS  --  2.4* 3.4 3.1  --    GFR: Estimated Creatinine Clearance: 50.9 mL/min (by C-G formula based on SCr of 0.76 mg/dL). Liver Function Tests: Recent Labs  Lab 05/18/18 0617 05/19/18 1011  05/20/18 0249 05/21/18 0430  AST 28  --   --   --   ALT 15  --   --   --   ALKPHOS 62  --   --   --   BILITOT 0.9  --   --   --   PROT 7.0  --   --   --   ALBUMIN 3.6 3.3* 2.6* 2.8*   Recent Labs  Lab 05/17/18 1621  LIPASE 29  AMYLASE 132*   No results for input(s): AMMONIA in the last 168 hours. Coagulation Profile: Recent Labs  Lab 05/17/18 1648  INR 0.98   Cardiac Enzymes: Recent Labs  Lab 05/17/18 1621 05/17/18 2110 05/18/18 0617 05/19/18 1011 05/21/18 0430  TROPONINI <0.03 0.03* 0.48* 7.50* 4.14*   BNP (last 3 results) No results for input(s): PROBNP in the last 8760 hours. HbA1C: No results for input(s): HGBA1C in the last 72 hours. CBG: No results for input(s): GLUCAP in the last 168 hours. Lipid Profile: Recent Labs    05/24/18 0558  TRIG 200*   Thyroid Function Tests: No results for input(s): TSH, T4TOTAL, FREET4, T3FREE, THYROIDAB in the last 72 hours. Anemia Panel: No results for  input(s): VITAMINB12, FOLATE, FERRITIN, TIBC, IRON, RETICCTPCT in the last 72 hours. Urine analysis:    Component Value Date/Time   COLORURINE YELLOW (A) 05/17/2018 1032   APPEARANCEUR CLEAR (A) 05/17/2018 1032   LABSPEC 1.017 05/17/2018 1032   PHURINE 7.0 05/17/2018 1032   GLUCOSEU NEGATIVE 05/17/2018 1032   HGBUR NEGATIVE 05/17/2018 1032   BILIRUBINUR NEGATIVE 05/17/2018 1032   KETONESUR NEGATIVE 05/17/2018 1032   PROTEINUR 30 (A) 05/17/2018 1032   UROBILINOGEN 0.2 01/01/2011 2129   NITRITE NEGATIVE 05/17/2018 1032   LEUKOCYTESUR NEGATIVE 05/17/2018 1032   Sepsis Labs: @LABRCNTIP (procalcitonin:4,lacticidven:4)  ) Recent Results (from the past 240 hour(s))  MRSA PCR Screening     Status: None   Collection Time: 05/17/18  5:30 PM  Result Value Ref Range Status   MRSA by PCR NEGATIVE NEGATIVE Final    Comment:        The GeneXpert MRSA Assay (FDA approved for NASAL specimens only), is one component of a comprehensive MRSA colonization surveillance  program. It is not intended to diagnose MRSA infection nor to guide or monitor treatment for MRSA infections. Performed at [facility], [address] [place], [place] [address]   Surgical pcr screen     Status: None   Collection Time: 05/20/18  6:10 PM  Result Value Ref Range Status   MRSA, PCR NEGATIVE NEGATIVE Final   Staphylococcus aureus NEGATIVE NEGATIVE Final    Comment: (NOTE) The Xpert SA Assay (FDA approved for NASAL specimens in patients 22 years of age and older), is one component of a comprehensive surveillance program. It is not intended to diagnose infection nor to guide or monitor treatment. Performed at [facility], [address] [place], [place] [address]       Radiology Studies: Dg Chest Port 1 View  Result Date: 05/23/2018 CLINICAL DATA:  Pleural effusion. EXAM: PORTABLE CHEST 1 VIEW COMPARISON:  Chest x-ray dated 05/22/2018. Chest CT dated 05/17/2018. FINDINGS: Stable veiled opacity at the LEFT lung base, compatible with mild atelectasis and/or small layering pleural effusion. RIGHT lung remains clear. No pneumothorax seen. Heart size is stable. Increased prominence of the LEFT perihilar shadow. IMPRESSION: 1. Increased prominence of the LEFT perihilar shadow, which could indicate increased thickening of the LEFT pulmonary artery walls due to underlying vasculitis and/or increasing fluid/hematoma adjacent to the central pulmonary arteries as suggested on recent chest CT angiogram of 05/17/2018. Recommend repeat chest CT angiogram for further characterization. 2. Stable veiled opacity at the LEFT lung base, compatible with small layering pleural effusion and/or atelectasis. 3.  Aortic Atherosclerosis (ICD10-I70.0). These results will be called to the ordering clinician or representative by the Radiologist Assistant, and communication documented in the PACS or zVision Dashboard. Electronically Signed   By: Stan  Maynard M.D.   On: 05/23/2018 08:49   Ct  Angio Chest/abd/pel For Dissection W And/or W/wo  Result Date: 05/24/2018 CLINICAL DATA:  77 year old with thoracic aortic disease and pre planning CT examination. 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 EXAM: CT ANGIOGRAPHY CHEST, ABDOMEN AND PELVIS TECHNIQUE: Multidetector CT imaging through the chest, abdomen and pelvis was performed using the standard protocol during bolus administration of intravenous contrast. Multiplanar reconstructed images and MIPs were obtained and reviewed to evaluate the vascular anatomy. CONTRAST:  <MEASUR<MEASUREM<MEASUREM<MEASUREMENTheadore Nan0 IOPAMIDOL (ISOVUE-370) INJECTION 76% COMPARISON:  05/17/2018 FINDINGS: CTA CHEST FINDINGS Cardiovascular: Again noted is extensive atherosclerotic disease involving the thoracic aorta. Again noted is a penetrating ulcer along the proximal descending thoracic aorta on sequence 8,  image 40. This penetrating ulcer is slightly more conspicuous than it was on the previous examination. There is a the new large penetrating ulcer along the posterior proximal/mid descending thoracic aorta on sequence 8, image 51 measuring 1 cm in diameter. The size of the thoracic aorta is difficult to measure because there is now left pleural fluid. However, the size of the descending thoracic aorta has not significantly changed. Interestingly, the pleural fluid appears to markedly increase between the pre and postcontrast studies which is roughly 2 minutes. Pleural fluid adjacent to the aorta is low density. The mid descending thoracic aorta roughly measures 3.9 cm and minimally changed from the study on 05/17/2018. Large penetrating ulcer along the posterior distal descending thoracic aorta measuring roughly 1.1 cm in diameter previously measuring 0.8 cm. Again noted is evidence for intramural hematoma within the distal descending thoracic aorta near the hiatus. Findings are concerning for evolution of the intramural hematoma and penetrating ulcers into a developing dissection  involving the descending thoracic aorta. Main pulmonary arteries are patent. Normal caliber of the aortic root and ascending thoracic aorta without dissection flap. Great vessels are patent. Extensive atherosclerotic disease involving the proximal left subclavian artery with areas of stenosis. Atherosclerotic disease involving the origin of left common carotid artery which is difficult to evaluate due to artifact from the adjacent contrast in the venous system. Previously, there was stranding or edema around the main pulmonary arteries which is less conspicuous on this examination. Heart size is normal without significant pericardial fluid. Coronary artery calcifications. Mediastinum/Nodes: No chest lymphadenopathy. Thyroid tissue is unremarkable. Lungs/Pleura: Trachea and mainstem bronchi are patent. New small left pleural effusion. Apparently, the left pleural fluid increases between the pre and post contrast images on this study. This is approximately a 2 minute interval. The fluid in left pleural space appears to be low-density rather than blood attenuation. Stable scarring at the lung apices. Ground-glass densities along the medial right upper lobe may be related to atelectasis or adjacent air trapping. Compressive atelectasis in the left lower lobe associated with the left pleural effusion. 6 mm calcified granuloma in the left upper lobe. Small pleural-based densities in the upper lobes bilaterally probably postinflammatory. Musculoskeletal: No acute bone abnormality. Review of the MIP images confirms the above findings. CTA ABDOMEN AND PELVIS FINDINGS VASCULAR Aorta: Extensive atherosclerotic disease in the aorta with an infrarenal abdominal aortic aneurysm. Infrarenal abdominal aorta aneurysm measures 4 cm and stable. No evidence for an abdominal aortic dissection or intramural hematoma in the abdominal aorta. Celiac: Celiac trunk is patent with mild-to-moderate stenosis at the origin due to mixed plaque.  Left gastric artery appears to originate directly from the aorta just above the celiac trunk. SMA: Atherosclerotic disease at the origin of the SMA with at least mild stenosis. Renals: Right renal artery is patent but small. There appears to be significant disease and stenosis near the origin. The right kidney is atrophic. Left renal artery is patent with extensive calcified plaque and at least mild stenosis. IMA: IMA is patent. Inflow: Aneurysm terminates well above the aortic bifurcation. Right common, internal and external iliac arteries are patent. Iliac arteries are small. Right external iliac artery roughly measures 6 mm in greatest diameter. Left iliac arteries are patent with a patent stent involving the left common and left external iliac artery. Left internal iliac artery is patent. The left external iliac artery is small measuring roughly 5 mm. Common femoral arteries are patent bilaterally but small. Proximal femoral arteries are patent. Veins: No  obvious venous abnormality within the limitations of this arterial phase study. Review of the MIP images confirms the above findings. NON-VASCULAR Hepatobiliary: Mild distention of the gallbladder without surrounding inflammatory changes. No acute abnormality to the liver. Pancreas: Unremarkable. No pancreatic ductal dilatation or surrounding inflammatory changes. Spleen: Normal in size without focal abnormality. Adrenals/Urinary Tract: Mild fullness in the right adrenal gland probably represents an adenoma or hyperplasia. Mild fullness of the left adrenal gland lateral limb is unchanged. Atrophic right kidney. Tiny calcification in left kidney lower pole is suggestive for small stone adjacent to a cortical cyst. Minimal left perinephric stranding. Additional small stone in the left kidney upper pole region measuring roughly 4 mm. High-density material in the left renal collecting system is most compatible with contrast. Urinary bladder is decompressed and  contains a small amount of contrast. Stomach/Bowel: Extensive diverticulosis in the sigmoid colon. Stomach is within normal limits. No evidence for bowel dilatation or obstruction. No evidence for focal bowel inflammation. Lymphatic: No significant lymph node enlargement in the abdomen or pelvis. Reproductive: There appears to be a small uterus. No gross abnormality to the uterus or adnexal structures. Other: Again noted is a large left femoral hernia containing fat. Negative for free fluid. No free air. Musculoskeletal: Again noted is a severe joint space narrowing and degenerative disease in the left hip joint. Remodeling of the left hip joint with extensive subchondral cyst formation. Findings are likely related to a posttraumatic changes with secondary severe degenerative disease. Stable compression deformity involving the T12 vertebral body. Review of the MIP images confirms the above findings. IMPRESSION: 1. Interval change in the descending thoracic aortic disease. New and enlarging penetrating ulcerations suggesting evolution from an intramural hematoma into an aortic dissection. The size of the descending thoracic aorta has not significantly changed although somewhat difficult to measure due to the new left pleural fluid. 2. Development of a small to moderate sized left pleural effusion. The pleural fluid is very peculiar in that there appears to be significant increase in left pleural fluid DURING the CTA examination. Although the pleural fluid is low-density, this fluid may be related to the aortic disease. 3. No change in the abdominal aortic aneurysm measuring 4 cm. 4. Diffuse atherosclerotic disease throughout the chest, abdomen and pelvis. Again noted is a stenosis involving the left subclavian artery and left common carotid artery. Stenosis involving the visceral arteries as described. 5. Small but patent left iliac arteries with a patent left iliac artery stent. 6. Nonobstructive left renal calculi.  7. Large left femoral hernia containing fat. These results were called by telephone at the time of interpretation on 05/24/2018 at 7:43 am to Dr. Wyonia Hough, who verbally acknowledged these results. Electronically Signed   By: Richarda Overlie M.D.   On: 05/24/2018 07:52     Scheduled Meds: . amLODipine  10 mg Oral Daily  . aspirin EC  81 mg Oral Daily  . docusate sodium  100 mg Oral Daily  . enoxaparin (LOVENOX) injection  40 mg Subcutaneous Q24H  . famotidine  20 mg Oral BID  . isosorbide mononitrate  15 mg Oral Daily  . mouth rinse  15 mL Mouth Rinse BID  . metoprolol tartrate  50 mg Oral BID  . oxyCODONE  10 mg Oral Q12H  . polyethylene glycol  17 g Oral Daily  . potassium citrate  10 mEq Oral BID WC  . rosuvastatin  20 mg Oral q1800  . sodium chloride flush  3 mL Intravenous Q12H  Continuous Infusions: . sodium chloride    . lactated ringers 182 mL/hr at 05/19/18 1500     LOS: 7 days   Time Spent in minutes   45 minutes  Khrystyna Schwalm D.O. on 05/24/2018 at 11:04 AM  Between 7am to 7pm - Please see pager noted on [email or web address]  After 7pm go to [email or web address]  And look for the night coverage person covering for me after hours  [facility] Office  [phone]

## 2018-05-24 NOTE — Progress Notes (Signed)
    Subjective  -   Remains pain free   Physical Exam:  Extremities warm No neuro deficits abd soft    CT scan: 1. Interval change in the descending thoracic aortic disease. New and enlarging penetrating ulcerations suggesting evolution from an intramural hematoma into an aortic dissection. The size of the descending thoracic aorta has not significantly changed although somewhat difficult to measure due to the new left pleural fluid. 2. Development of a small to moderate sized left pleural effusion. The pleural fluid is very peculiar in that there appears to be significant increase in left pleural fluid DURING the CTA examination. Although the pleural fluid is low-density, this fluid may be related to the aortic disease. 3. No change in the abdominal aortic aneurysm measuring 4 cm. 4. Diffuse atherosclerotic disease throughout the chest, abdomen and pelvis. Again noted is a stenosis involving the left subclavian artery and left common carotid artery. Stenosis involving the visceral arteries as described. 5. Small but patent left iliac arteries with a patent left iliac artery stent. 6. Nonobstructive left renal calculi. 7. Large left femoral hernia containing fat.   Assessment/Plan:    IMH:  Appears to have progressed by CT scan this am.  No acute indication for surgery.  She will need to stay I the hospital for continued blood pressure control and another repeat CT scan in a few days.  Given increase in pleural fluid seen on CT scan (which increased during the time from non-contrast to contrast images) I WOULD GET A cxr LATER TODAY, to evaluate her pleural effusion  Wells Jamile Rekowski 05/24/2018 7:54 AM --  Vitals:   05/23/18 2119 05/24/18 0548  BP: 137/67 134/65  Pulse: 93 (!) 111  Resp: 13 (!) 25  Temp: 98.7 F (37.1 C) 98.7 F (37.1 C)  SpO2:  95%    Intake/Output Summary (Last 24 hours) at 05/24/2018 0754 Last data filed at 05/24/2018 0636 Gross per 24 hour   Intake 500 ml  Output 900 ml  Net -400 ml     Laboratory CBC    Component Value Date/Time   WBC 6.1 05/24/2018 0558   HGB 12.2 05/24/2018 0558   HGB 15.3 10/02/2012 0608   HCT 37.0 05/24/2018 0558   HCT 44.6 10/02/2012 0608   PLT 240 05/24/2018 0558   PLT 262 10/02/2012 0608    BMET    Component Value Date/Time   NA 138 05/22/2018 0614   NA 137 10/02/2012 0608   K 4.0 05/22/2018 0614   K 3.5 10/02/2012 0608   CL 102 05/22/2018 0614   CL 105 10/02/2012 0608   CO2 28 05/22/2018 0614   CO2 24 10/02/2012 0608   GLUCOSE 101 (H) 05/22/2018 0614   GLUCOSE 124 (H) 10/02/2012 0608   BUN 10 05/22/2018 0614   BUN 8 10/02/2012 0608   CREATININE 0.76 05/22/2018 0614   CREATININE 0.84 10/02/2012 0608   CALCIUM 8.8 (L) 05/22/2018 0614   CALCIUM 8.7 10/02/2012 0608   GFRNONAA >60 05/22/2018 0614   GFRNONAA >60 09/08/2012 1457   GFRAA >60 05/22/2018 0614   GFRAA >60 09/08/2012 1457    COAG Lab Results  Component Value Date   INR 0.98 05/17/2018   INR 1.0 10/02/2012   INR 0.9 04/18/2012   No results found for: PTT  Antibiotics Anti-infectives (From admission, onward)   None       V. Charlena Cross, M.D. [facility] Office: [phone] Pager:  [phone]

## 2018-05-25 DIAGNOSIS — I2510 Atherosclerotic heart disease of native coronary artery without angina pectoris: Secondary | ICD-10-CM

## 2018-05-25 DIAGNOSIS — J90 Pleural effusion, not elsewhere classified: Secondary | ICD-10-CM

## 2018-05-25 DIAGNOSIS — R079 Chest pain, unspecified: Secondary | ICD-10-CM

## 2018-05-25 DIAGNOSIS — I25118 Atherosclerotic heart disease of native coronary artery with other forms of angina pectoris: Secondary | ICD-10-CM

## 2018-05-25 LAB — CBC
HEMATOCRIT: 34.9 % — AB (ref 36.0–46.0)
HEMOGLOBIN: 10.9 g/dL — AB (ref 12.0–15.0)
MCH: 32.4 pg (ref 26.0–34.0)
MCHC: 31.2 g/dL (ref 30.0–36.0)
MCV: 103.9 fL — ABNORMAL HIGH (ref 80.0–100.0)
NRBC: 0 % (ref 0.0–0.2)
Platelets: 263 10*3/uL (ref 150–400)
RBC: 3.36 MIL/uL — AB (ref 3.87–5.11)
RDW: 12.2 % (ref 11.5–15.5)
WBC: 5.5 10*3/uL (ref 4.0–10.5)

## 2018-05-25 LAB — BASIC METABOLIC PANEL
ANION GAP: 6 (ref 5–15)
BUN: 13 mg/dL (ref 8–23)
CHLORIDE: 104 mmol/L (ref 98–111)
CO2: 30 mmol/L (ref 22–32)
CREATININE: 0.83 mg/dL (ref 0.44–1.00)
Calcium: 9 mg/dL (ref 8.9–10.3)
GFR calc non Af Amer: >60 mL/min (ref >60)
Glucose, Bld: 103 mg/dL — ABNORMAL HIGH (ref 70–99)
POTASSIUM: 3.9 mmol/L (ref 3.5–5.1)
SODIUM: 140 mmol/L (ref 135–145)

## 2018-05-25 MED ORDER — SALINE SPRAY 0.65 % NA SOLN
1.0000 | NASAL | Status: DC | PRN
  Filled 2018-05-25: qty 44

## 2018-05-25 NOTE — Progress Notes (Signed)
    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  Patient is pain free.  Therefore, no acute indication for surgery.  Given the progression of the pathology on serial CT scans, she will need another scan in a day or 2 to determine the full extend of her disease.  Continue with blood pressure control.    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  Asx, continue to monitor  Wells Ilyas Lipsitz 05/25/2018 5:49 AM --  Vitals:   05/24/18 2202 05/25/18 0532  BP:  (!) 128/50  Pulse: 93 83  Resp:  13  Temp:  98.6 F (37 C)  SpO2:  95%    Intake/Output Summary (Last 24 hours) at 05/25/2018 0549 Last data filed at 05/25/2018 0533 Gross per 24 hour  Intake 563 ml  Output 600 ml  Net -37 ml     Laboratory CBC    Component Value Date/Time   WBC 5.5 05/25/2018 0456   HGB 10.9 (L) 05/25/2018 0456   HGB 15.3 10/02/2012 0608   HCT 34.9 (L) 05/25/2018 0456   HCT 44.6 10/02/2012 0608   PLT 263 05/25/2018 0456   PLT 262 10/02/2012 0608    BMET    Component Value Date/Time   NA 138 05/22/2018 0614   NA 137 10/02/2012 0608   K 4.0 05/22/2018 0614   K 3.5 10/02/2012 0608   CL 102 05/22/2018 0614   CL 105 10/02/2012 0608   CO2 28 05/22/2018 0614   CO2 24 10/02/2012 0608   GLUCOSE 101 (H) 05/22/2018 0614   GLUCOSE 124 (H) 10/02/2012 0608   BUN 10 05/22/2018 0614   BUN 8 10/02/2012 0608   CREATININE 0.76 05/22/2018 0614   CREATININE 0.84 10/02/2012 0608   CALCIUM 8.8 (L) 05/22/2018 0614   CALCIUM 8.7 10/02/2012 0608   GFRNONAA >60 05/22/2018 0614   GFRNONAA >60 09/08/2012 1457   GFRAA >60 05/22/2018 0614   GFRAA >60 09/08/2012 1457    COAG Lab Results  Component Value  Date   INR 0.98 05/17/2018   INR 1.0 10/02/2012   INR 0.9 04/18/2012   No results found for: PTT  Antibiotics Anti-infectives (From admission, onward)   None       V. Charlena Cross, M.D. [facility] Office: [phone] Pager:  [phone]

## 2018-05-25 NOTE — Progress Notes (Signed)
PROGRESS NOTE    Elizabeth [name]  ZOX:[id number] DOB: 07/10/1941 DOA: 05/17/2018 PCP: Kari Baars, MD   Brief Narrative:  HPI on 05/17/2018 by Dr. Warren Lacy (PCCM) This is a 77 year old with a history of peripheral vascular disease who had the sudden onset of severe back pain this morning with radiation around both flanks just below the ribs.  The pain was noncolicky and nonpleuritic.  There is  no associated chest pain or shortness of breath.  She was making coffee at the time of onset there have been no unusual exertion.  A CTA of the chest is shown to ulcerations of the descending aorta and she was referred from  to here for definitive care.  Interim history Admitted for descending thoracic aortic aneurysm and is currently being followed by vascular surgery.  Also noted to have an NSTEMI followed by cardiology. 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  New and enlarging penetrating ulcerations suggesting evolution from an intramural hematoma and to an aortic dissection. -Vascular surgery would like to continue to monitor patient and obtain repeat CT scan in a few days. Continue to control BP.  NSTEMI -Cardiology consulted and appreciated, troponin peaked to 7 -Left heart catheterization on 05/20/2018 with total occlusions of the circumflex and RCA, single remaining conduit to supply LAD -Patient is not a PCI candidate as per cardiology -Cardiothoracic surgery consulted and appreciated, patient will be high risk -Continue aspirin, statin, metoprolol (will not restart plavix at this time given above findings) -heparin discontinued -Echocardiogram: EF 60 to 65%, hypokinesis of the mid apical inferior lateral  myocardium.  Grade 1 diastolic dysfunction -Vein mapping ordered by surgery -Discussed surgery with patient, she does not feel that she wants to go through CABG surgery due to pain and risks.  -Plan for medical management per cardiology  Pleural effusion -Patient noted to have development of a small to moderate sized left pleural effusion -Chest x-ray ordered for later today -Currently patient has no shortness of breath  Essential hypertension -Patient presented with hypertensive urgency and required drips for blood pressure control -Currently blood pressure is stable, continue Norvasc, Lopressor  Chronic left hip pain -Continue OxyContin, Tylenol as needed  History of nephrolithiasis -Continue potassium citrate  Former tobacco abuser -PFTs have been ordered  PAD -Status post left common iliac artery stenting -CTA neck showed atheromatous change along great vessel origins, likely flow reducing involving the left subclavian -Vascular surgery following -Continue medications as above  Physical deconditioning -PT consulted, recommended HH -Case management consulted  Goals of care/Code status -Dicussed code status with patient, she prefers to be DNR, no compressions or intubation -she wishes to be comfortable and with her family  DVT Prophylaxis  Heparin drip discontinued, placed on lovenox   Code Status: DNR  Family Communication: None at bedside  Disposition Plan: Admitted. Pending repeat CTA in a few days per vascular.  Consultants PCCM Cardiology Vascular surgery Cardiothoracic surgery  Procedures  Echocardiogram Carotid Doppler Left heart catheterization  Antibiotics   Anti-infectives (From admission, onward)   None      Subjective:   Elizabeth [name] seen and examined today.  Complains of back and hip pain, rates it as a 5 out of 10.  Denies current chest pain, shortness of breath, abdominal pain, nausea vomiting,  diarrhea or constipation.   Objective:     Vitals:   05/24/18 2202 05/25/18 0532 05/25/18 0810 05/25/18 0826  BP:  (!) 128/50 139/70 139/70  Pulse: 93 83 90   Resp:  13    Temp:  98.6 F (37 C) 98.6 F (37 C)   TempSrc:  Oral Oral   SpO2:  95% 95%   Weight:  60.1 kg    Height:        Intake/Output Summary (Last 24 hours) at 05/25/2018 1117 Last data filed at 05/25/2018 0533 Gross per 24 hour  Intake -  Output 600 ml  Net -600 ml   Filed Weights   05/23/18 0500 05/24/18 0548 05/25/18 0532  Weight: 60.3 kg 61.1 kg 60.1 kg   Exam  General: Well developed, well nourished, NAD, appears stated age  HEENT: NCAT, mucous membranes moist.   Neck: Supple  Cardiovascular: S1 S2 auscultated, 2/6 SEM, RRR  Respiratory: Clear to auscultation bilaterally   Abdomen: Soft, nontender, nondistended, + bowel sounds  Extremities: warm dry without cyanosis clubbing or edema  Neuro: AAOx3, nonfocal  Psych: Pleasant, appropriate mood and affect  Data Reviewed: I have personally reviewed following labs and imaging studies  CBC: Recent Labs  Lab 05/21/18 0430 05/22/18 0614 05/23/18 0458 05/24/18 0558 05/25/18 0456  WBC 5.2 4.7 5.1 6.1 5.5  HGB 12.0 11.6* 11.7* 12.2 10.9*  HCT 37.2 35.7* 36.0 37.0 34.9*  MCV 102.8* 102.3* 101.7* 102.2* 103.9*  PLT 173 186 214 240 263   Basic Metabolic Panel: Recent Labs  Lab 05/19/18 1011 05/20/18 0249 05/21/18 0430 05/22/18 0614 05/25/18 0456  NA 139 140 139 138 140  K 4.1 3.9 3.8 4.0 3.9  CL 104 106 104 102 104  CO2 27 27 26 28 30   GLUCOSE 112* 101* 93 101* 103*  BUN 12 12 14 10 13   CREATININE 0.81 0.85 0.84 0.76 0.83  CALCIUM 8.9 8.4* 9.0 8.8* 9.0  MG 2.1 1.9  --   --   --   PHOS 2.4* 3.4 3.1  --   --    GFR: Estimated Creatinine Clearance: 48.7 mL/min (by C-G formula based on SCr of 0.83 mg/dL). Liver Function Tests: Recent Labs  Lab 05/19/18 1011 05/20/18 0249 05/21/18 0430  ALBUMIN 3.3* 2.6* 2.8*   No results for input(s): LIPASE, AMYLASE in the last 168  hours. No results for input(s): AMMONIA in the last 168 hours. Coagulation Profile: No results for input(s): INR, PROTIME in the last 168 hours. Cardiac Enzymes: Recent Labs  Lab 05/19/18 1011 05/21/18 0430  TROPONINI 7.50* 4.14*   BNP (last 3 results) No results for input(s): PROBNP in the last 8760 hours. HbA1C: No results for input(s): HGBA1C in the last 72 hours. CBG: No results for input(s): GLUCAP in the last 168 hours. Lipid Profile: Recent Labs    05/24/18 0558  TRIG 200*   Thyroid Function Tests: No results for input(s): TSH, T4TOTAL, FREET4, T3FREE, THYROIDAB in the last 72 hours. Anemia Panel: No results for input(s): VITAMINB12, FOLATE, FERRITIN, TIBC, IRON, RETICCTPCT in the last 72 hours. Urine analysis:    Component Value Date/Time   COLORURINE YELLOW (A) 05/17/2018 1032   APPEARANCEUR CLEAR (A) 05/17/2018 1032   LABSPEC 1.017 05/17/2018 1032   PHURINE 7.0 05/17/2018 1032   GLUCOSEU NEGATIVE 05/17/2018 1032   HGBUR NEGATIVE 05/17/2018 1032   BILIRUBINUR NEGATIVE 05/17/2018 1032   KETONESUR NEGATIVE 05/17/2018 1032   PROTEINUR 30 (A) 05/17/2018 1032   UROBILINOGEN 0.2 01/01/2011 2129  NITRITE NEGATIVE 05/17/2018 1032   LEUKOCYTESUR NEGATIVE 05/17/2018 1032   Sepsis Labs: @LABRCNTIP (procalcitonin:4,lacticidven:4)  ) Recent Results (from the past 240 hour(s))  MRSA PCR Screening     Status: None   Collection Time: 05/17/18  5:30 PM  Result Value Ref Range Status   MRSA by PCR NEGATIVE NEGATIVE Final    Comment:        The GeneXpert MRSA Assay (FDA approved for NASAL specimens only), is one component of a comprehensive MRSA colonization surveillance program. It is not intended to diagnose MRSA infection nor to guide or monitor treatment for MRSA infections. Performed at [facility], [address], [place], [place] [address]   Surgical pcr screen     Status: None   Collection Time: 05/20/18  6:10 PM  Result Value Ref Range Status    MRSA, PCR NEGATIVE NEGATIVE Final   Staphylococcus aureus NEGATIVE NEGATIVE Final    Comment: (NOTE) The Xpert SA Assay (FDA approved for NASAL specimens in patients 90 years of age and older), is one component of a comprehensive surveillance program. It is not intended to diagnose infection nor to guide or monitor treatment. Performed at [facility], [address], [place], [place] [address]       Radiology Studies: Dg Chest 2 View  Result Date: 05/24/2018 CLINICAL DATA:  Aortic aneurysm by perforated ulcers. EXAM: CHEST - 2 VIEW COMPARISON:  05/23/2018 FINDINGS: Normal heart size and vascularity. Aorta is atherosclerotic and mildly ectatic. Left lower lobe collapse/consolidation noted with a posterior fusion. Difficult to exclude basilar pneumonia. Right lung remains clear. Left upper lobe calcified granuloma noted. No pneumothorax. Trachea is midline. Bones are osteopenic. Chronic appearing T12 compression fracture abdominal of the inferior endplate IMPRESSION: Left lower lobe collapse/consolidation and associated effusion. Difficult to exclude pneumonia. Thoracic aortic atherosclerosis and ectasia by plain radiography. Osteopenia and degenerative changes Remote granulomatous disease Electronically Signed   By: Judie Petit.  Shick M.D.   On: 05/24/2018 14:19   Ct Angio Chest/abd/pel For Dissection W And/or W/wo  Result Date: 05/24/2018 CLINICAL DATA:  77 year old with thoracic aortic disease and pre planning CT examination. 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 EXAM: CT ANGIOGRAPHY CHEST, ABDOMEN AND PELVIS TECHNIQUE: Multidetector CT imaging through the chest, abdomen and pelvis was performed using the standard protocol during bolus administration of intravenous contrast. Multiplanar reconstructed images and MIPs were obtained and reviewed to evaluate the vascular anatomy. CONTRAST:  ISOVUE-370 IOPAMIDOL (ISOVUE-370) INJECTION 76% COMPARISON:  05/17/2018 FINDINGS: CTA  CHEST FINDINGS Cardiovascular: Again noted is extensive atherosclerotic disease involving the thoracic aorta. Again noted is a penetrating ulcer along the proximal descending thoracic aorta on sequence 8, image 40. This penetrating ulcer is slightly more conspicuous than it was on the previous examination. There is a the new large penetrating ulcer along the posterior proximal/mid descending thoracic aorta on sequence 8, image 51 measuring 1 cm in diameter. The size of the thoracic aorta is difficult to measure because there is now left pleural fluid. However, the size of the descending thoracic aorta has not significantly changed. Interestingly, the pleural fluid appears to markedly increase between the pre and postcontrast studies which is roughly 2 minutes. Pleural fluid adjacent to the aorta is low density. The mid descending thoracic aorta roughly measures 3.9 cm and minimally changed from the study on 05/17/2018. Large penetrating ulcer along the posterior distal descending thoracic aorta measuring roughly 1.1 cm in diameter previously measuring 0.8 cm. Again noted is evidence for intramural  hematoma within the distal descending thoracic aorta near the hiatus. Findings are concerning for evolution of the intramural hematoma and penetrating ulcers into a developing dissection involving the descending thoracic aorta. Main pulmonary arteries are patent. Normal caliber of the aortic root and ascending thoracic aorta without dissection flap. Great vessels are patent. Extensive atherosclerotic disease involving the proximal left subclavian artery with areas of stenosis. Atherosclerotic disease involving the origin of left common carotid artery which is difficult to evaluate due to artifact from the adjacent contrast in the venous system. Previously, there was stranding or edema around the main pulmonary arteries which is less conspicuous on this examination. Heart size is normal without significant pericardial  fluid. Coronary artery calcifications. Mediastinum/Nodes: No chest lymphadenopathy. Thyroid tissue is unremarkable. Lungs/Pleura: Trachea and mainstem bronchi are patent. New small left pleural effusion. Apparently, the left pleural fluid increases between the pre and post contrast images on this study. This is approximately a 2 minute interval. The fluid in left pleural space appears to be low-density rather than blood attenuation. Stable scarring at the lung apices. Ground-glass densities along the medial right upper lobe may be related to atelectasis or adjacent air trapping. Compressive atelectasis in the left lower lobe associated with the left pleural effusion. 6 mm calcified granuloma in the left upper lobe. Small pleural-based densities in the upper lobes bilaterally probably postinflammatory. Musculoskeletal: No acute bone abnormality. Review of the MIP images confirms the above findings. CTA ABDOMEN AND PELVIS FINDINGS VASCULAR Aorta: Extensive atherosclerotic disease in the aorta with an infrarenal abdominal aortic aneurysm. Infrarenal abdominal aorta aneurysm measures 4 cm and stable. No evidence for an abdominal aortic dissection or intramural hematoma in the abdominal aorta. Celiac: Celiac trunk is patent with mild-to-moderate stenosis at the origin due to mixed plaque. Left gastric artery appears to originate directly from the aorta just above the celiac trunk. SMA: Atherosclerotic disease at the origin of the SMA with at least mild stenosis. Renals: Right renal artery is patent but small. There appears to be significant disease and stenosis near the origin. The right kidney is atrophic. Left renal artery is patent with extensive calcified plaque and at least mild stenosis. IMA: IMA is patent. Inflow: Aneurysm terminates well above the aortic bifurcation. Right common, internal and external iliac arteries are patent. Iliac arteries are small. Right external iliac artery roughly measures 6 mm in  greatest diameter. Left iliac arteries are patent with a patent stent involving the left common and left external iliac artery. Left internal iliac artery is patent. The left external iliac artery is small measuring roughly 5 mm. Common femoral arteries are patent bilaterally but small. Proximal femoral arteries are patent. Veins: No obvious venous abnormality within the limitations of this arterial phase study. Review of the MIP images confirms the above findings. NON-VASCULAR Hepatobiliary: Mild distention of the gallbladder without surrounding inflammatory changes. No acute abnormality to the liver. Pancreas: Unremarkable. No pancreatic ductal dilatation or surrounding inflammatory changes. Spleen: Normal in size without focal abnormality. Adrenals/Urinary Tract: Mild fullness in the right adrenal gland probably represents an adenoma or hyperplasia. Mild fullness of the left adrenal gland lateral limb is unchanged. Atrophic right kidney. Tiny calcification in left kidney lower pole is suggestive for small stone adjacent to a cortical cyst. Minimal left perinephric stranding. Additional small stone in the left kidney upper pole region measuring roughly 4 mm. High-density material in the left renal collecting system is most compatible with contrast. Urinary bladder is decompressed and contains a small amount of  contrast. Stomach/Bowel: Extensive diverticulosis in the sigmoid colon. Stomach is within normal limits. No evidence for bowel dilatation or obstruction. No evidence for focal bowel inflammation. Lymphatic: No significant lymph node enlargement in the abdomen or pelvis. Reproductive: There appears to be a small uterus. No gross abnormality to the uterus or adnexal structures. Other: Again noted is a large left femoral hernia containing fat. Negative for free fluid. No free air. Musculoskeletal: Again noted is a severe joint space narrowing and degenerative disease in the left hip joint. Remodeling of the  left hip joint with extensive subchondral cyst formation. Findings are likely related to a posttraumatic changes with secondary severe degenerative disease. Stable compression deformity involving the T12 vertebral body. Review of the MIP images confirms the above findings. IMPRESSION: 1. Interval change in the descending thoracic aortic disease. New and enlarging penetrating ulcerations suggesting evolution from an intramural hematoma into an aortic dissection. The size of the descending thoracic aorta has not significantly changed although somewhat difficult to measure due to the new left pleural fluid. 2. Development of a small to moderate sized left pleural effusion. The pleural fluid is very peculiar in that there appears to be significant increase in left pleural fluid DURING the CTA examination. Although the pleural fluid is low-density, this fluid may be related to the aortic disease. 3. No change in the abdominal aortic aneurysm measuring 4 cm. 4. Diffuse atherosclerotic disease throughout the chest, abdomen and pelvis. Again noted is a stenosis involving the left subclavian artery and left common carotid artery. Stenosis involving the visceral arteries as described. 5. Small but patent left iliac arteries with a patent left iliac artery stent. 6. Nonobstructive left renal calculi. 7. Large left femoral hernia containing fat. These results were called by telephone at the time of interpretation on 05/24/2018 at 7:43 am to Dr. Wyonia Hough, who verbally acknowledged these results. Electronically Signed   By: Richarda Overlie M.D.   On: 05/24/2018 07:52     Scheduled Meds: . amLODipine  10 mg Oral Daily  . aspirin EC  81 mg Oral Daily  . docusate sodium  100 mg Oral Daily  . enoxaparin (LOVENOX) injection  40 mg Subcutaneous Q24H  . famotidine  20 mg Oral BID  . isosorbide mononitrate  15 mg Oral Daily  . mouth rinse  15 mL Mouth Rinse BID  . metoprolol tartrate  50 mg Oral BID  . oxyCODONE  10 mg Oral  Q12H  . polyethylene glycol  17 g Oral Daily  . potassium citrate  10 mEq Oral BID WC  . rosuvastatin  20 mg Oral q1800  . sodium chloride flush  3 mL Intravenous Q12H   Continuous Infusions: . sodium chloride    . lactated ringers 182 mL/hr at 05/19/18 1500     LOS: 8 days   Time Spent in minutes   30 minutes  Verlon Carcione D.O. on 05/25/2018 at 11:17 AM  Between 7am to 7pm - Please see pager noted on [email or web address]  After 7pm go to [email or web address]  And look for the night coverage person covering for me after hours  [facility] Office  [phone]

## 2018-05-25 NOTE — Progress Notes (Signed)
Progress Note  Patient Name: Elizabeth [name] Date of Encounter: 05/25/2018  Primary Cardiologist: Dr. Allyson Sabal  Subjective   No back or chest pain doing well hoping CT scans stay stable  Inpatient Medications    Scheduled Meds: . amLODipine  10 mg Oral Daily  . aspirin EC  81 mg Oral Daily  . docusate sodium  100 mg Oral Daily  . enoxaparin (LOVENOX) injection  40 mg Subcutaneous Q24H  . famotidine  20 mg Oral BID  . isosorbide mononitrate  15 mg Oral Daily  . mouth rinse  15 mL Mouth Rinse BID  . metoprolol tartrate  50 mg Oral BID  . oxyCODONE  10 mg Oral Q12H  . polyethylene glycol  17 g Oral Daily  . potassium citrate  10 mEq Oral BID WC  . rosuvastatin  20 mg Oral q1800  . sodium chloride flush  3 mL Intravenous Q12H   Continuous Infusions: . sodium chloride    . lactated ringers 182 mL/hr at 05/19/18 1500   PRN Meds: sodium chloride, acetaminophen, fentaNYL (SUBLIMAZE) injection, HYDROcodone-acetaminophen, metoprolol tartrate, ondansetron (ZOFRAN) IV, sodium chloride, sodium chloride flush   Vital Signs    Vitals:   05/24/18 2028 05/24/18 2202 05/25/18 0532 05/25/18 0826  BP: 135/64  (!) 128/50 139/70  Pulse: (!) 101 93 83   Resp:   13   Temp: 99 F (37.2 C)  98.6 F (37 C)   TempSrc: Oral  Oral   SpO2: 94%  95%   Weight:   60.1 kg   Height:        Intake/Output Summary (Last 24 hours) at 05/25/2018 0911 Last data filed at 05/25/2018 0533 Gross per 24 hour  Intake -  Output 600 ml  Net -600 ml   Filed Weights   05/23/18 0500 05/24/18 0548 05/25/18 0532  Weight: 60.3 kg 61.1 kg 60.1 kg    Telemetry    Sinus Rhythm, Episode of Sinus Tach to 103  - Personally Reviewed  ECG   None Today  Physical Exam   GEN: No acute distress.   Neck: No JVD, Bilateral Bruit  Cardiac: RRR; Systolic murmur; No rubs, or gallops.  Respiratory: Clear to auscultation bilaterally. GI: Soft, nontender, non-distended  MS: No edema; No deformity; Clean R ulnar  cath site, pulse intact Neuro:  Nonfocal  Psych: Normal affect   Labs    Chemistry Recent Labs  Lab 05/19/18 1011 05/20/18 0249 05/21/18 0430 05/22/18 0614 05/25/18 0456  NA 139 140 139 138 140  K 4.1 3.9 3.8 4.0 3.9  CL 104 106 104 102 104  CO2 27 27 26 28 30   GLUCOSE 112* 101* 93 101* 103*  BUN 12 12 14 10 13   CREATININE 0.81 0.85 0.84 0.76 0.83  CALCIUM 8.9 8.4* 9.0 8.8* 9.0  ALBUMIN 3.3* 2.6* 2.8*  --   --   GFRNONAA >60 >60 >60 >60 >60  GFRAA >60 >60 >60 >60 >60  ANIONGAP 8 7 9 8 6      Hematology Recent Labs  Lab 05/23/18 0458 05/24/18 0558 05/25/18 0456  WBC 5.1 6.1 5.5  RBC 3.54* 3.62* 3.36*  HGB 11.7* 12.2 10.9*  HCT 36.0 37.0 34.9*  MCV 101.7* 102.2* 103.9*  MCH 33.1 33.7 32.4  MCHC 32.5 33.0 31.2  RDW 12.1 12.1 12.2  PLT 214 240 263    Cardiac Enzymes Recent Labs  Lab 05/19/18 1011 05/21/18 0430  TROPONINI 7.50* 4.14*    No results for input(s): TROPIPOC in  the last 168 hours.   BNPNo results for input(s): BNP, PROBNP in the last 168 hours.   DDimer No results for input(s): DDIMER in the last 168 hours.   Radiology    Dg Chest 2 View  Result Date: 05/24/2018 CLINICAL DATA:  Aortic aneurysm by perforated ulcers. EXAM: CHEST - 2 VIEW COMPARISON:  05/23/2018 FINDINGS: Normal heart size and vascularity. Aorta is atherosclerotic and mildly ectatic. Left lower lobe collapse/consolidation noted with a posterior fusion. Difficult to exclude basilar pneumonia. Right lung remains clear. Left upper lobe calcified granuloma noted. No pneumothorax. Trachea is midline. Bones are osteopenic. Chronic appearing T12 compression fracture abdominal of the inferior endplate IMPRESSION: Left lower lobe collapse/consolidation and associated effusion. Difficult to exclude pneumonia. Thoracic aortic atherosclerosis and ectasia by plain radiography. Osteopenia and degenerative changes Remote granulomatous disease Electronically Signed   By: Judie Petit.  Shick M.D.   On:  05/24/2018 14:19   Ct Angio Chest/abd/pel For Dissection W And/or W/wo  Result Date: 05/24/2018 CLINICAL DATA:  77 year old with thoracic aortic disease and pre planning CT examination. 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 EXAM: CT ANGIOGRAPHY CHEST, ABDOMEN AND PELVIS TECHNIQUE: Multidetector CT imaging through the chest, abdomen and pelvis was performed using the standard protocol during bolus administration of intravenous contrast. Multiplanar reconstructed images and MIPs were obtained and reviewed to evaluate the vascular anatomy. CONTRAST:  ISOVUE-370 IOPAMIDOL (ISOVUE-370) INJECTION 76% COMPARISON:  05/17/2018 FINDINGS: CTA CHEST FINDINGS Cardiovascular: Again noted is extensive atherosclerotic disease involving the thoracic aorta. Again noted is a penetrating ulcer along the proximal descending thoracic aorta on sequence 8, image 40. This penetrating ulcer is slightly more conspicuous than it was on the previous examination. There is a the new large penetrating ulcer along the posterior proximal/mid descending thoracic aorta on sequence 8, image 51 measuring 1 cm in diameter. The size of the thoracic aorta is difficult to measure because there is now left pleural fluid. However, the size of the descending thoracic aorta has not significantly changed. Interestingly, the pleural fluid appears to markedly increase between the pre and postcontrast studies which is roughly 2 minutes. Pleural fluid adjacent to the aorta is low density. The mid descending thoracic aorta roughly measures 3.9 cm and minimally changed from the study on 05/17/2018. Large penetrating ulcer along the posterior distal descending thoracic aorta measuring roughly 1.1 cm in diameter previously measuring 0.8 cm. Again noted is evidence for intramural hematoma within the distal descending thoracic aorta near the hiatus. Findings are concerning for evolution of the intramural hematoma and penetrating ulcers into a  developing dissection involving the descending thoracic aorta. Main pulmonary arteries are patent. Normal caliber of the aortic root and ascending thoracic aorta without dissection flap. Great vessels are patent. Extensive atherosclerotic disease involving the proximal left subclavian artery with areas of stenosis. Atherosclerotic disease involving the origin of left common carotid artery which is difficult to evaluate due to artifact from the adjacent contrast in the venous system. Previously, there was stranding or edema around the main pulmonary arteries which is less conspicuous on this examination. Heart size is normal without significant pericardial fluid. Coronary artery calcifications. Mediastinum/Nodes: No chest lymphadenopathy. Thyroid tissue is unremarkable. Lungs/Pleura: Trachea and mainstem bronchi are patent. New small left pleural effusion. Apparently, the left pleural fluid increases between the pre and post contrast images on this study. This is approximately a 2 minute interval. The fluid in left pleural space appears to be low-density rather than blood attenuation. Stable scarring at the lung apices.  Ground-glass densities along the medial right upper lobe may be related to atelectasis or adjacent air trapping. Compressive atelectasis in the left lower lobe associated with the left pleural effusion. 6 mm calcified granuloma in the left upper lobe. Small pleural-based densities in the upper lobes bilaterally probably postinflammatory. Musculoskeletal: No acute bone abnormality. Review of the MIP images confirms the above findings. CTA ABDOMEN AND PELVIS FINDINGS VASCULAR Aorta: Extensive atherosclerotic disease in the aorta with an infrarenal abdominal aortic aneurysm. Infrarenal abdominal aorta aneurysm measures 4 cm and stable. No evidence for an abdominal aortic dissection or intramural hematoma in the abdominal aorta. Celiac: Celiac trunk is patent with mild-to-moderate stenosis at the origin  due to mixed plaque. Left gastric artery appears to originate directly from the aorta just above the celiac trunk. SMA: Atherosclerotic disease at the origin of the SMA with at least mild stenosis. Renals: Right renal artery is patent but small. There appears to be significant disease and stenosis near the origin. The right kidney is atrophic. Left renal artery is patent with extensive calcified plaque and at least mild stenosis. IMA: IMA is patent. Inflow: Aneurysm terminates well above the aortic bifurcation. Right common, internal and external iliac arteries are patent. Iliac arteries are small. Right external iliac artery roughly measures 6 mm in greatest diameter. Left iliac arteries are patent with a patent stent involving the left common and left external iliac artery. Left internal iliac artery is patent. The left external iliac artery is small measuring roughly 5 mm. Common femoral arteries are patent bilaterally but small. Proximal femoral arteries are patent. Veins: No obvious venous abnormality within the limitations of this arterial phase study. Review of the MIP images confirms the above findings. NON-VASCULAR Hepatobiliary: Mild distention of the gallbladder without surrounding inflammatory changes. No acute abnormality to the liver. Pancreas: Unremarkable. No pancreatic ductal dilatation or surrounding inflammatory changes. Spleen: Normal in size without focal abnormality. Adrenals/Urinary Tract: Mild fullness in the right adrenal gland probably represents an adenoma or hyperplasia. Mild fullness of the left adrenal gland lateral limb is unchanged. Atrophic right kidney. Tiny calcification in left kidney lower pole is suggestive for small stone adjacent to a cortical cyst. Minimal left perinephric stranding. Additional small stone in the left kidney upper pole region measuring roughly 4 mm. High-density material in the left renal collecting system is most compatible with contrast. Urinary bladder is  decompressed and contains a small amount of contrast. Stomach/Bowel: Extensive diverticulosis in the sigmoid colon. Stomach is within normal limits. No evidence for bowel dilatation or obstruction. No evidence for focal bowel inflammation. Lymphatic: No significant lymph node enlargement in the abdomen or pelvis. Reproductive: There appears to be a small uterus. No gross abnormality to the uterus or adnexal structures. Other: Again noted is a large left femoral hernia containing fat. Negative for free fluid. No free air. Musculoskeletal: Again noted is a severe joint space narrowing and degenerative disease in the left hip joint. Remodeling of the left hip joint with extensive subchondral cyst formation. Findings are likely related to a posttraumatic changes with secondary severe degenerative disease. Stable compression deformity involving the T12 vertebral body. Review of the MIP images confirms the above findings. IMPRESSION: 1. Interval change in the descending thoracic aortic disease. New and enlarging penetrating ulcerations suggesting evolution from an intramural hematoma into an aortic dissection. The size of the descending thoracic aorta has not significantly changed although somewhat difficult to measure due to the new left pleural fluid. 2. Development of a small to  moderate sized left pleural effusion. The pleural fluid is very peculiar in that there appears to be significant increase in left pleural fluid DURING the CTA examination. Although the pleural fluid is low-density, this fluid may be related to the aortic disease. 3. No change in the abdominal aortic aneurysm measuring 4 cm. 4. Diffuse atherosclerotic disease throughout the chest, abdomen and pelvis. Again noted is a stenosis involving the left subclavian artery and left common carotid artery. Stenosis involving the visceral arteries as described. 5. Small but patent left iliac arteries with a patent left iliac artery stent. 6. Nonobstructive  left renal calculi. 7. Large left femoral hernia containing fat. These results were called by telephone at the time of interpretation on 05/24/2018 at 7:43 am to Dr. Wyonia Hough, who verbally acknowledged these results. Electronically Signed   By: Richarda Overlie M.D.   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  Distal RCA fills via well-formed collaterals around the left ventricular apex from the LAD.  Total occlusion of a relatively small circumflex.  Minimal collaterals are noted.  Ostial 60% left main with pressure damping noted during engagement.  At least 60% distal left main is noted.  Heavily calcified 80% proximal LAD followed by eccentric calcified slitlike 70% mid LAD stenosis.  Normal LV function with EF 55%.  RECOMMENDATIONS:   The patient has a single remaining conduit which is the left main coronary artery supplying the LAD.  Both the circumflex and right coronary are occluded.  The distal left main, proximal LAD is aneurysmal and heavily calcified.  The LAD supplies well-formed collaterals to the PDA.  Recommend evaluation for potential surgical revascularization.  No appealing interventional options exist.   Echo 05/20/18: Study Conclusions - Left ventricle: The cavity size was normal. Wall thickness was   normal. Systolic function was normal. The estimated ejection   fraction was in the range of 60% to 65%. Hypokinesis of the   mid-apicalinferolateral myocardium; consistent with ischemia in   the distribution of the right coronary or left circumflex   coronary artery. Doppler parameters are consistent with abnormal   left ventricular relaxation (grade 1 diastolic dysfunction). - Mitral valve: There was mild regurgitation.   Patient Profile     77 y.o. female with a hx of peripheral vascular disease status post left common iliac artery stenting followed by [place] vascular vein, bilateral carotid  endarterectomy, hypertension and abdominal aortic aneurysm who is being seen for NSTEMI. Cath showed severe multi-vessel disease. CABG workup ongoing.  Assessment & Plan    1) Non-STEMI: 10/12, In setting of acute ulceration of thoracic aorta. Troponin peaked to 7. Severe multi-vessel disease on cath as above. Poor targets for PCI intervention discussed with patient. CT Surgery consulted and have begun their work up. ECHO with EF 60-65%, Hypokinesis of mid-apicalinferolateral myocardium, and G1DD. There is concern for surgical risk given patient's comorbid conditions. Patient is to work with cardiac rehab this week and under go further evaluation. Stable CVTS declined surgery medical Rx  - Continue  ASA, Rosuvastatin 20mg  Daily, and Metoprolol 50mg  BID and imdur    2) Acute ulceration with intramural hematoma of the thoracic aorta. - Pain stable today, seen by Dr Myra Gianotti this am plan for repeat CT in a few days keep in hospital   3) Carotid artery disease: S/P bilateral endarterectomy. Doppler showed: Right ICA/CEA patent; 80-99% Left ICA stenosis.  - On Plavix  4)  PAD: s/p left common iliac artery stenting  5) HTN: BP 120s Sytolic this AM - Continue amlodipine, metoprolol, Nitro gtt - No Cleviprex use since 10/13, Will D/C  4. AAA: Slightly larger than prior, now at 4cm.  5. Pleural effusion: left small by CXR yesterday f/u per primary service     For questions or updates, please contact [facility] Please consult [email or web address] for contact info under      Signed, Charlton Haws, MD  05/25/2018, 9:11 AM

## 2018-05-26 LAB — CBC
HEMATOCRIT: 35.2 % — AB (ref 36.0–46.0)
Hemoglobin: 10.9 g/dL — ABNORMAL LOW (ref 12.0–15.0)
MCH: 32.2 pg (ref 26.0–34.0)
MCHC: 31 g/dL (ref 30.0–36.0)
MCV: 103.8 fL — AB (ref 80.0–100.0)
PLATELETS: 298 10*3/uL (ref 150–400)
RBC: 3.39 MIL/uL — ABNORMAL LOW (ref 3.87–5.11)
RDW: 12.1 % (ref 11.5–15.5)
WBC: 5.6 10*3/uL (ref 4.0–10.5)
nRBC: 0 % (ref 0.0–0.2)

## 2018-05-26 LAB — BASIC METABOLIC PANEL
Anion gap: 5 (ref 5–15)
BUN: 13 mg/dL (ref 8–23)
CALCIUM: 8.8 mg/dL — AB (ref 8.9–10.3)
CO2: 29 mmol/L (ref 22–32)
CREATININE: 0.81 mg/dL (ref 0.44–1.00)
Chloride: 105 mmol/L (ref 98–111)
GFR calc Af Amer: >60 mL/min (ref >60)
GLUCOSE: 100 mg/dL — AB (ref 70–99)
Potassium: 4.2 mmol/L (ref 3.5–5.1)
Sodium: 139 mmol/L (ref 135–145)

## 2018-05-26 MED ORDER — METOPROLOL TARTRATE 50 MG PO TABS
75.0000 mg | ORAL_TABLET | Freq: Two times a day (BID) | ORAL | Status: DC
  Administered 2018-05-26 – 2018-05-28 (×5): 75 mg via ORAL
  Filled 2018-05-26 (×5): qty 1
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
## 2018-05-26 NOTE — Progress Notes (Addendum)
Progress Note  Patient Name: Elizabeth [name] Date of Encounter: 05/26/2018  Primary Cardiologist: Nanetta Batty, MD   Subjective   No chest pain. Has lower back pain. Pain between shoulder blade has been resolved.   Inpatient Medications    Scheduled Meds: . amLODipine  10 mg Oral Daily  . aspirin EC  81 mg Oral Daily  . docusate sodium  100 mg Oral Daily  . enoxaparin (LOVENOX) injection  40 mg Subcutaneous Q24H  . famotidine  20 mg Oral BID  . isosorbide mononitrate  15 mg Oral Daily  . mouth rinse  15 mL Mouth Rinse BID  . metoprolol tartrate  75 mg Oral BID  . oxyCODONE  10 mg Oral Q12H  . polyethylene glycol  17 g Oral Daily  . potassium citrate  10 mEq Oral BID WC  . rosuvastatin  20 mg Oral q1800  . sodium chloride flush  3 mL Intravenous Q12H   Continuous Infusions: . sodium chloride    . lactated ringers 182 mL/hr at 05/19/18 1500   PRN Meds: sodium chloride, acetaminophen, fentaNYL (SUBLIMAZE) injection, HYDROcodone-acetaminophen, metoprolol tartrate, ondansetron (ZOFRAN) IV, sodium chloride, sodium chloride flush   Vital Signs    Vitals:   05/25/18 1255 05/25/18 1455 05/25/18 2159 05/26/18 0625  BP: (!) 124/58 (!) 124/58 136/63 (!) 127/54  Pulse:  83 95 81  Resp:   16   Temp:  99 F (37.2 C) 98.9 F (37.2 C) 99 F (37.2 C)  TempSrc:  Oral Oral Oral  SpO2:  94% 92% 92%  Weight:    61.3 kg  Height:        Intake/Output Summary (Last 24 hours) at 05/26/2018 0909 Last data filed at 05/25/2018 1500 Gross per 24 hour  Intake -  Output 300 ml  Net -300 ml   Filed Weights   05/24/18 0548 05/25/18 0532 05/26/18 0625  Weight: 61.1 kg 60.1 kg 61.3 kg    Telemetry    Sinus rhythm at 90s, intermittently goes into 110s - Personally Reviewed  ECG    N/A  Physical Exam   GEN: No acute distress.   Neck: No JVD Cardiac: RRR, no murmurs, rubs, or gallops.  Respiratory: Clear to auscultation bilaterally. GI: Soft, nontender, non-distended    MS: No edema; No deformity. Neuro:  Nonfocal  Psych: Normal affect   Labs    Chemistry Recent Labs  Lab 05/19/18 1011 05/20/18 0249 05/21/18 0430 05/22/18 0614 05/25/18 0456 05/26/18 0432  NA 139 140 139 138 140 139  K 4.1 3.9 3.8 4.0 3.9 4.2  CL 104 106 104 102 104 105  CO2 27 27 26 28 30 29   GLUCOSE 112* 101* 93 101* 103* 100*  BUN 12 12 14 10 13 13   CREATININE 0.81 0.85 0.84 0.76 0.83 0.81  CALCIUM 8.9 8.4* 9.0 8.8* 9.0 8.8*  ALBUMIN 3.3* 2.6* 2.8*  --   --   --   GFRNONAA >60 >60 >60 >60 >60 >60  GFRAA >60 >60 >60 >60 >60 >60  ANIONGAP 8 7 9 8 6 5      Hematology Recent Labs  Lab 05/24/18 0558 05/25/18 0456 05/26/18 0432  WBC 6.1 5.5 5.6  RBC 3.62* 3.36* 3.39*  HGB 12.2 10.9* 10.9*  HCT 37.0 34.9* 35.2*  MCV 102.2* 103.9* 103.8*  MCH 33.7 32.4 32.2  MCHC 33.0 31.2 31.0  RDW 12.1 12.2 12.1  PLT 240 263 298    Cardiac Enzymes Recent Labs  Lab 05/19/18 1011 05/21/18  0430  TROPONINI 7.50* 4.14*   No results for input(s): TROPIPOC in the last 168 hours.   Radiology    Dg Chest 2 View  Result Date: 05/24/2018 CLINICAL DATA:  Aortic aneurysm by perforated ulcers. EXAM: CHEST - 2 VIEW COMPARISON:  05/23/2018 FINDINGS: Normal heart size and vascularity. Aorta is atherosclerotic and mildly ectatic. Left lower lobe collapse/consolidation noted with a posterior fusion. Difficult to exclude basilar pneumonia. Right lung remains clear. Left upper lobe calcified granuloma noted. No pneumothorax. Trachea is midline. Bones are osteopenic. Chronic appearing T12 compression fracture abdominal of the inferior endplate IMPRESSION: Left lower lobe collapse/consolidation and associated effusion. Difficult to exclude pneumonia. Thoracic aortic atherosclerosis and ectasia by plain radiography. Osteopenia and degenerative changes Remote granulomatous disease Electronically Signed   By: Judie Petit.  Shick M.D.   On: 05/24/2018 14:19    Cardiac Studies   Cath 05/19/18  Total  occlusion of the mid to distal RCA. Distal RCA fills via well-formed collaterals around the left ventricular apex from the LAD.  Total occlusion of a relatively small circumflex. Minimal collaterals are noted.  Ostial 60% left main with pressure damping noted during engagement. At least 60% distal left main is noted.  Heavily calcified 80% proximal LAD followed by eccentric calcified slitlike 70% mid LAD stenosis.  Normal LV function with EF 55%.  RECOMMENDATIONS:   The patient has a single remaining conduit which is the left main coronary artery supplying the LAD. Both the circumflex and right coronary are occluded. The distal left main, proximal LAD is aneurysmal and heavily calcified. The LAD supplies well-formed collaterals to the PDA.  Recommend evaluation for potential surgical revascularization.  No appealing interventional options exist.   Echo 05/20/18: Study Conclusions - Left ventricle: The cavity size was normal. Wall thickness was normal. Systolic function was normal. The estimated ejection fraction was in the range of 60% to 65%. Hypokinesis of the mid-apicalinferolateral myocardium; consistent with ischemia in the distribution of the right coronary or left circumflex coronary artery. Doppler parameters are consistent with abnormal left ventricular relaxation (grade 1 diastolic dysfunction). - Mitral valve: There was mild regurgitation.   Patient Profile     77 y.o. female with a hx of peripheral vascular disease status post left common iliac artery stenting followed by [place] [facility] vein, bilateral carotid endarterectomy, hypertension and abdominal aortic aneurysmwho is being seen for NSTEMI. Cath showed severe multi-vessel disease.   Assessment & Plan    1) Non-STEMI: 10/12, In setting of acute ulceration of thoracic aorta. Troponin peaked to 7. Severe multi-vessel disease on cath as above. Poor targets for PCI intervention. Seen by  Dr. Donata Clay and felt medical management best options. ECHO with EF 60-65%, Hypokinesis ofmid-apicalinferolateral myocardium, and G1DD. No chest pain.  - Continue  ASA, Rosuvastatin 20mg  Daily, BB and imdur   2) Acuteulceration with intramural hematoma of the thoracic aorta. - Plan to repeat study   3)Carotid artery disease: S/P bilateral endarterectomy. Doppler showed: Right ICA/CEA patent; 80-99% Left ICA stenosis.  - On Plavix  4)PAD: s/pleft common iliac artery stenting  5) HTN: - BP stable on current medication however intermittent tachycardiac, mostly with ambulation. Will increase metoprolol to 75mg  BID  4. AAA: Slightly larger than prior, now at 4cm.  5. Pleural effusion: per primary service   For questions or updates, please contact [facility] Please consult [email or web address] for contact info under     SignedManson Passey, PA  05/26/2018, 9:09 AM    Patient seen, examined. Available data  reviewed. Agree with findings, assessment, and plan as outlined by Chelsea Aus, PA-C.  The patient is independently interviewed and examined.  She has had no recurrence of back pain and she is had no chest pain over the past few days.  She is alert, oriented, in no distress.  Heart is regular rate and rhythm with a grade 2/6 systolic ejection murmur at the right upper sternal border.  Lung fields are clear, abdomen is soft and nontender with no masses, extremities show no edema.  I have reviewed the patient's coronary angiogram and agree that medical therapy is her best treatment.  If she develops anginal symptoms or any signs of significant ischemia, high risk PCI could be considered.  Her medical program is reviewed and includes a beta-blocker, long-acting nitrate, and statin drug.  Plans noted for repeat CT scan to follow her thoracic aortic intramural hematoma.  Metoprolol will be increased to 75 mg twice daily.  Tonny Bollman, M.D. 05/26/2018 10:43 AM

## 2018-05-26 NOTE — Progress Notes (Signed)
CARDIAC REHAB PHASE I   PRE:  Rate/Rhythm: 83 SR  BP:  Supine:   Sitting: 128/58  Standing:    SaO2: 93%RA  MODE:  Ambulation: 150 ft   POST:  Rate/Rhythm: 93 SR  BP:  Supine:   Sitting: `139/67  Standing:    SaO2: 92%RA  1450-1505 Pt walked 150 ft on RA with gait belt use, rolling walker and asst x1. Tolerated well. To bed after walk. Family in room. Luetta Nutting, RN BSN  05/26/2018 3:02 PM

## 2018-05-26 NOTE — Progress Notes (Signed)
  Progress Note    05/26/2018 2:54 PM 7 Days Post-Op  Subjective: He has not had any back or abdominal pain over the weekend  Vitals:   05/26/18 0625 05/26/18 0958  BP: (!) 127/54 133/66  Pulse: 81 97  Resp:    Temp: 99 F (37.2 C)   SpO2: 92%     Physical Exam: Awake alert and oriented Nonlabored respirations Strong right femoral pulse very weak on the left Abdomen is soft nontender nondistended  CBC    Component Value Date/Time   WBC 5.6 05/26/2018 0432   RBC 3.39 (L) 05/26/2018 0432   HGB 10.9 (L) 05/26/2018 0432   HGB 15.3 10/02/2012 0608   HCT 35.2 (L) 05/26/2018 0432   HCT 44.6 10/02/2012 0608   PLT 298 05/26/2018 0432   PLT 262 10/02/2012 0608   MCV 103.8 (H) 05/26/2018 0432   MCV 101 (H) 10/02/2012 0608   MCH 32.2 05/26/2018 0432   MCHC 31.0 05/26/2018 0432   RDW 12.1 05/26/2018 0432   RDW 12.3 10/02/2012 0608   LYMPHSABS 1.6 10/02/2012 0608   MONOABS 0.6 10/02/2012 0608   EOSABS 0.0 10/02/2012 0608   BASOSABS 0.0 10/02/2012 0608    BMET    Component Value Date/Time   NA 139 05/26/2018 0432   NA 137 10/02/2012 0608   K 4.2 05/26/2018 0432   K 3.5 10/02/2012 0608   CL 105 05/26/2018 0432   CL 105 10/02/2012 0608   CO2 29 05/26/2018 0432   CO2 24 10/02/2012 0608   GLUCOSE 100 (H) 05/26/2018 0432   GLUCOSE 124 (H) 10/02/2012 0608   BUN 13 05/26/2018 0432   BUN 8 10/02/2012 0608   CREATININE 0.81 05/26/2018 0432   CREATININE 0.84 10/02/2012 0608   CALCIUM 8.8 (L) 05/26/2018 0432   CALCIUM 8.7 10/02/2012 0608   GFRNONAA >60 05/26/2018 0432   GFRNONAA >60 09/08/2012 1457   GFRAA >60 05/26/2018 0432   GFRAA >60 09/08/2012 1457    INR    Component Value Date/Time   INR 0.98 05/17/2018 1648   INR 1.0 10/02/2012 0608     Intake/Output Summary (Last 24 hours) at 05/26/2018 1454 Last data filed at 05/25/2018 1500 Gross per 24 hour  Intake -  Output 100 ml  Net -100 ml     Assessment:  77 y.o. female is here with thoracic IMH with  progression of her renal hematoma as well as signs of her thoracic aorta.  Hospitalization complicated by non-STEMI that has been managed nonoperatively.  Plan: Repeat CT scan tomorrow.  Hopefully we can get her through this hospitalization without operation.  Operation her would be high risk given recent non-STEMI as well as need for surgical conduit for delivery of thoracic endograft.  Trina Asch C. Randie Heinz, MD [facility] Office: [phone] Pager: [phone]  05/26/2018 2:54 PM

## 2018-05-26 NOTE — Progress Notes (Signed)
PROGRESS NOTE    Elizabeth [name]  WUJ:[id number] DOB: 06-07-1941 DOA: 05/17/2018 PCP: Kari Baars, MD   Brief Narrative:  HPI on 05/17/2018 by Dr. Warren Lacy (PCCM) This is a 77 year old with a history of peripheral vascular disease who had the sudden onset of severe back pain this morning with radiation around both flanks just below the ribs.  The pain was noncolicky and nonpleuritic.  There is  no associated chest pain or shortness of breath.  She was making coffee at the time of onset there have been no unusual exertion.  A CTA of the chest is shown to ulcerations of the descending aorta and she was referred from [place] to here for definitive care.  Interim history Admitted for descending thoracic aortic aneurysm and is currently being followed by vascular surgery.  Also noted to have an NSTEMI followed by cardiology. 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  New and enlarging penetrating ulcerations suggesting evolution from an intramural hematoma and to an aortic dissection. -Vascular surgery would like to continue to monitor patient and obtain repeat CT scan in a few days. Continue to control BP. -Discussed with Dr. Randie Heinz, vascular, will plan for repeat CT on 10/22. Will discuss possible surgery with patient.   NSTEMI -Cardiology consulted and appreciated, troponin peaked to 7 -Left heart catheterization on 05/20/2018 with total occlusions of the circumflex and RCA, single remaining conduit to supply LAD -Patient is not a PCI candidate as per cardiology -Cardiothoracic surgery consulted and appreciated, patient will be high risk -Continue aspirin, statin, metoprolol (will not restart plavix at this time given above  findings) -heparin discontinued -Echocardiogram: EF 60 to 65%, hypokinesis of the mid apical inferior lateral myocardium.  Grade 1 diastolic dysfunction -Vein mapping ordered by surgery -Discussed surgery with patient, she does not feel that she wants to go through CABG surgery due to pain and risks.  -Plan for medical management per cardiology  Pleural effusion -Patient noted to have development of a small to moderate sized left pleural effusion -Chest x-ray ordered for later today -Currently patient has no shortness of breath  Essential hypertension -Patient presented with hypertensive urgency and required drips for blood pressure control -Currently blood pressure is stable, continue Norvasc, Lopressor  Chronic left hip pain -Continue OxyContin, Tylenol as needed  History of nephrolithiasis -Continue potassium citrate  Former tobacco abuser -PFTs have been ordered  PAD -Status post left common iliac artery stenting -CTA neck showed atheromatous change along great vessel origins, likely flow reducing involving the left subclavian -Vascular surgery following -Continue medications as above  Physical deconditioning -PT consulted, recommended HH -Case management consulted  Goals of care/Code status -Dicussed code status with patient, she prefers to be DNR, no compressions or intubation -she wishes to be comfortable and with her family  DVT Prophylaxis  Heparin drip discontinued, placed on lovenox   Code Status: DNR  Family Communication: None at bedside  Disposition Plan: Admitted. Dispo pending. Pending repeat CTA per vascular.  Consultants PCCM Cardiology Vascular surgery Cardiothoracic surgery  Procedures  Echocardiogram Carotid Doppler Left heart catheterization  Antibiotics   Anti-infectives (From admission, onward)   None      Subjective:   Elizabeth [name] seen and examined today.  Continues to have back/hip pain but feels it is  tolerable. Denies  current chest pain, shortness of breath, abdominal pain, N/V/D/C.   Objective:   Vitals:   05/25/18 1455 05/25/18 2159 05/26/18 0625 05/26/18 0958  BP: (!) 124/58 136/63 (!) 127/54 133/66  Pulse: 83 95 81 97  Resp:  16    Temp: 99 F (37.2 C) 98.9 F (37.2 C) 99 F (37.2 C)   TempSrc: Oral Oral Oral   SpO2: 94% 92% 92%   Weight:   61.3 kg   Height:        Intake/Output Summary (Last 24 hours) at 05/26/2018 1027 Last data filed at 05/25/2018 1500 Gross per 24 hour  Intake -  Output 300 ml  Net -300 ml   Filed Weights   05/24/18 0548 05/25/18 0532 05/26/18 0625  Weight: 61.1 kg 60.1 kg 61.3 kg   Exam  General: Well developed, well nourished, NAD, appears stated age  HEENT: NCAT,  mucous membranes moist.   Neck: Supple  Cardiovascular: S1 S2 auscultated, 2/6 SEM, RRR  Respiratory: Clear to auscultation bilaterally with equal chest rise  Abdomen: Soft, nontender, nondistended, + bowel sounds  Extremities: warm dry without cyanosis clubbing or edema  Neuro: AAOx3, nonfocal  Psych: Normal affect and demeanor with intact judgement and insight   Data Reviewed: I have personally reviewed following labs and imaging studies  CBC: Recent Labs  Lab 05/22/18 0614 05/23/18 0458 05/24/18 0558 05/25/18 0456 05/26/18 0432  WBC 4.7 5.1 6.1 5.5 5.6  HGB 11.6* 11.7* 12.2 10.9* 10.9*  HCT 35.7* 36.0 37.0 34.9* 35.2*  MCV 102.3* 101.7* 102.2* 103.9* 103.8*  PLT 186 214 240 263 298   Basic Metabolic Panel: Recent Labs  Lab 05/20/18 0249 05/21/18 0430 05/22/18 0614 05/25/18 0456 05/26/18 0432  NA 140 139 138 140 139  K 3.9 3.8 4.0 3.9 4.2  CL 106 104 102 104 105  CO2 27 26 28 30 29   GLUCOSE 101* 93 101* 103* 100*  BUN 12 14 10 13 13   CREATININE 0.85 0.84 0.76 0.83 0.81  CALCIUM 8.4* 9.0 8.8* 9.0 8.8*  MG 1.9  --   --   --   --   PHOS 3.4 3.1  --   --   --    GFR: Estimated Creatinine Clearance: 50.4 mL/min (by C-G formula based on SCr of 0.81  mg/dL). Liver Function Tests: Recent Labs  Lab 05/20/18 0249 05/21/18 0430  ALBUMIN 2.6* 2.8*   No results for input(s): LIPASE, AMYLASE in the last 168 hours. No results for input(s): AMMONIA in the last 168 hours. Coagulation Profile: No results for input(s): INR, PROTIME in the last 168 hours. Cardiac Enzymes: Recent Labs  Lab 05/21/18 0430  TROPONINI 4.14*   BNP (last 3 results) No results for input(s): PROBNP in the last 8760 hours. HbA1C: No results for input(s): HGBA1C in the last 72 hours. CBG: No results for input(s): GLUCAP in the last 168 hours. Lipid Profile: Recent Labs    05/24/18 0558  TRIG 200*   Thyroid Function Tests: No results for input(s): TSH, T4TOTAL, FREET4, T3FREE, THYROIDAB in the last 72 hours. Anemia Panel: No results for input(s): VITAMINB12, FOLATE, FERRITIN, TIBC, IRON, RETICCTPCT in the last 72 hours. Urine analysis:    Component Value Date/Time   COLORURINE YELLOW (A) 05/17/2018 1032   APPEARANCEUR CLEAR (A) 05/17/2018 1032   LABSPEC 1.017 05/17/2018 1032   PHURINE 7.0 05/17/2018 1032   GLUCOSEU NEGATIVE 05/17/2018 1032   HGBUR NEGATIVE 05/17/2018 1032   BILIRUBINUR NEGATIVE 05/17/2018 1032  KETONESUR NEGATIVE 05/17/2018 1032   PROTEINUR 30 (A) 05/17/2018 1032   UROBILINOGEN 0.2 01/01/2011 2129   NITRITE NEGATIVE 05/17/2018 1032   LEUKOCYTESUR NEGATIVE 05/17/2018 1032   Sepsis Labs: @LABRCNTIP (procalcitonin:4,lacticidven:4)  ) Recent Results (from the past 240 hour(s))  MRSA PCR Screening     Status: None   Collection Time: 05/17/18  5:30 PM  Result Value Ref Range Status   MRSA by PCR NEGATIVE NEGATIVE Final    Comment:        The GeneXpert MRSA Assay (FDA approved for NASAL specimens only), is one component of a comprehensive MRSA colonization surveillance program. It is not intended to diagnose MRSA infection nor to guide or monitor treatment for MRSA infections. Performed at [facility], [address] [place], [place] [address]   Surgical pcr screen     Status: None   Collection Time: 05/20/18  6:10 PM  Result Value Ref Range Status   MRSA, PCR NEGATIVE NEGATIVE Final   Staphylococcus aureus NEGATIVE NEGATIVE Final    Comment: (NOTE) The Xpert SA Assay (FDA approved for NASAL specimens in patients 31 years of age and older), is one component of a comprehensive surveillance program. It is not intended to diagnose infection nor to guide or monitor treatment. Performed at [facility], [address], [place], [place] [address]       Radiology Studies: Dg Chest 2 View  Result Date: 05/24/2018 CLINICAL DATA:  Aortic aneurysm by perforated ulcers. EXAM: CHEST - 2 VIEW COMPARISON:  05/23/2018 FINDINGS: Normal heart size and vascularity. Aorta is atherosclerotic and mildly ectatic. Left lower lobe collapse/consolidation noted with a posterior fusion. Difficult to exclude basilar pneumonia. Right lung remains clear. Left upper lobe calcified granuloma noted. No pneumothorax. Trachea is midline. Bones are osteopenic. Chronic appearing T12 compression fracture abdominal of the inferior endplate IMPRESSION: Left lower lobe collapse/consolidation and associated effusion. Difficult to exclude pneumonia. Thoracic aortic atherosclerosis and ectasia by plain radiography. Osteopenia and degenerative changes Remote granulomatous disease Electronically Signed   By: Judie Petit.  Shick M.D.   On: 05/24/2018 14:19     Scheduled Meds: . amLODipine  10 mg Oral Daily  . aspirin EC  81 mg Oral Daily  . docusate sodium  100 mg Oral Daily  . enoxaparin (LOVENOX) injection  40 mg Subcutaneous Q24H  . famotidine  20 mg Oral BID  . isosorbide mononitrate  15 mg Oral Daily  . mouth rinse  15 mL Mouth Rinse BID  . metoprolol tartrate  75 mg Oral BID  . oxyCODONE  10 mg Oral Q12H  . polyethylene glycol  17 g Oral Daily  . potassium citrate  10 mEq Oral BID WC  . rosuvastatin  20 mg Oral q1800  . sodium chloride  flush  3 mL Intravenous Q12H   Continuous Infusions: . sodium chloride    . lactated ringers 182 mL/hr at 05/19/18 1500     LOS: 9 days   Time Spent in minutes   30 minutes  Kinlie Janice D.O. on 05/26/2018 at 10:27 AM  Between 7am to 7pm - Please see pager noted on [email or web address]  After 7pm go to [email or web address]  And look for the night coverage person covering for me after hours  [facility] Office  [phone]

## 2018-05-26 NOTE — Progress Notes (Signed)
[id number] Came to see pt to walk. Pt stated she just walked with PT. Requesting to go to bed. Assisted pt to bed and helped her to get comfortable. Will return to walk if time permits. Luetta Nutting RN BSN 05/26/2018 11:39 AM

## 2018-05-26 NOTE — Care Management Important Message (Signed)
Important Message  Patient Details  Name: Elizabeth [name] MRN: [id number] Date of Birth: 1941/03/11   Medicare Important Message Given:  Yes    Elery Cadenhead P Darwin Rothlisberger 05/26/2018, 1:48 PM

## 2018-05-27 ENCOUNTER — Inpatient Hospital Stay (HOSPITAL_COMMUNITY): Payer: Medicare Other

## 2018-05-27 ENCOUNTER — Ambulatory Visit: Payer: Medicare Other | Admitting: Cardiology

## 2018-05-27 LAB — CBC
HCT: 35.4 % — ABNORMAL LOW (ref 36.0–46.0)
Hemoglobin: 11 g/dL — ABNORMAL LOW (ref 12.0–15.0)
MCH: 32.4 pg (ref 26.0–34.0)
MCHC: 31.1 g/dL (ref 30.0–36.0)
MCV: 104.4 fL — AB (ref 80.0–100.0)
NRBC: 0 % (ref 0.0–0.2)
PLATELETS: 334 10*3/uL (ref 150–400)
RBC: 3.39 MIL/uL — AB (ref 3.87–5.11)
RDW: 11.9 % (ref 11.5–15.5)
WBC: 5.3 10*3/uL (ref 4.0–10.5)

## 2018-05-27 LAB — TRIGLYCERIDES: Triglycerides: 152 mg/dL — ABNORMAL HIGH (ref <150)

## 2018-05-27 MED ORDER — CLOPIDOGREL BISULFATE 75 MG PO TABS
75.0000 mg | ORAL_TABLET | Freq: Every day | ORAL | Status: DC
  Administered 2018-05-27 – 2018-05-28 (×2): 75 mg via ORAL
  Filled 2018-05-27 (×2): qty 1

## 2018-05-27 MED ORDER — IOPAMIDOL (ISOVUE-370) INJECTION 76%
INTRAVENOUS | Status: AC
  Administered 2018-05-27: 100 mL
  Filled 2018-05-27: qty 100

## 2018-05-27 MED ORDER — IOPAMIDOL (ISOVUE-370) INJECTION 76%
INTRAVENOUS | Status: AC
  Filled 2018-05-27: qty 100

## 2018-05-27 NOTE — Progress Notes (Addendum)
Progress Note  Patient Name: Elizabeth [name] Date of Encounter: 05/27/2018  Primary Cardiologist: Nanetta Batty, MD   Subjective   No significant overnight events. Patient notes some hip pain which is chronic. She denies any chest pain, back pain, or shortness of breath.   Inpatient Medications    Scheduled Meds: . amLODipine  10 mg Oral Daily  . aspirin EC  81 mg Oral Daily  . docusate sodium  100 mg Oral Daily  . enoxaparin (LOVENOX) injection  40 mg Subcutaneous Q24H  . famotidine  20 mg Oral BID  . iopamidol      . isosorbide mononitrate  15 mg Oral Daily  . mouth rinse  15 mL Mouth Rinse BID  . metoprolol tartrate  75 mg Oral BID  . oxyCODONE  10 mg Oral Q12H  . polyethylene glycol  17 g Oral Daily  . potassium citrate  10 mEq Oral BID WC  . rosuvastatin  20 mg Oral q1800  . sodium chloride flush  3 mL Intravenous Q12H   Continuous Infusions: . sodium chloride    . lactated ringers 182 mL/hr at 05/19/18 1500   PRN Meds: sodium chloride, acetaminophen, fentaNYL (SUBLIMAZE) injection, HYDROcodone-acetaminophen, metoprolol tartrate, ondansetron (ZOFRAN) IV, sodium chloride, sodium chloride flush   Vital Signs    Vitals:   05/26/18 1700 05/26/18 2010 05/27/18 0456 05/27/18 0500  BP: 126/66 (!) 133/54 (!) 124/55   Pulse: 89 85 70   Resp:      Temp: 98.9 F (37.2 C) 98.7 F (37.1 C) 99 F (37.2 C)   TempSrc: Oral Oral Oral   SpO2: 95% 93% 91%   Weight:    61.7 kg  Height:        Intake/Output Summary (Last 24 hours) at 05/27/2018 0742 Last data filed at 05/26/2018 1857 Gross per 24 hour  Intake 804 ml  Output -  Net 804 ml   Filed Weights   05/25/18 0532 05/26/18 0625 05/27/18 0500  Weight: 60.1 kg 61.3 kg 61.7 kg    Telemetry    Normal sinus rhythm/sinus tachycardia, rate ranging between 60s to low 100s bpm with brief intermittent episodes in 110s to low 120s bpm. - Personally Reviewed  Physical Exam   GEN: 77 year old Caucasian female  resting comfortably in no acute distress.   Neck: Supple.  Cardiac: RRR. No significant murmurs, rubs, or gallops.  Respiratory: No increased work of breathing. Clear to auscultation bilaterally. GI: Abdomen soft, nontender, non-distended. Bowel sounds present. MS: No significant lower extremity edema. Neuro:  No focal deficits.  Psych: Normal affect.  Labs    Chemistry Recent Labs  Lab 05/21/18 0430 05/22/18 0614 05/25/18 0456 05/26/18 0432  NA 139 138 140 139  K 3.8 4.0 3.9 4.2  CL 104 102 104 105  CO2 26 28 30 29   GLUCOSE 93 101* 103* 100*  BUN 14 10 13 13   CREATININE 0.84 0.76 0.83 0.81  CALCIUM 9.0 8.8* 9.0 8.8*  ALBUMIN 2.8*  --   --   --   GFRNONAA >60 >60 >60 >60  GFRAA >60 >60 >60 >60  ANIONGAP 9 8 6 5      Hematology Recent Labs  Lab 05/25/18 0456 05/26/18 0432 05/27/18 0428  WBC 5.5 5.6 5.3  RBC 3.36* 3.39* 3.39*  HGB 10.9* 10.9* 11.0*  HCT 34.9* 35.2* 35.4*  MCV 103.9* 103.8* 104.4*  MCH 32.4 32.2 32.4  MCHC 31.2 31.0 31.1  RDW 12.2 12.1 11.9  PLT 263 298  334    Cardiac Enzymes Recent Labs  Lab 05/21/18 0430  TROPONINI 4.14*   No results for input(s): TROPIPOC in the last 168 hours.   BNPNo results for input(s): BNP, PROBNP in the last 168 hours.   DDimer No results for input(s): DDIMER in the last 168 hours.   Radiology    No results found.  Cardiac Studies   Catheterization 05/19/18:  Total occlusion of the mid to distal RCA. Distal RCA fills via well-formed collaterals around the left ventricular apex from the LAD.  Total occlusion of a relatively small circumflex. Minimal collaterals are noted.  Ostial 60% left main with pressure damping noted during engagement. At least 60% distal left main is noted.  Heavily calcified 80% proximal LAD followed by eccentric calcified slitlike 70% mid LAD stenosis.  Normal LV function with EF 55%.  RECOMMENDATIONS:  The patient has a single remaining conduit which is the left main  coronary artery supplying the LAD. Both the circumflex and right coronary are occluded. The distal left main, proximal LAD is aneurysmal and heavily calcified. The LAD supplies well-formed collaterals to the PDA.  Recommend evaluation for potential surgical revascularization.  No appealing interventional options exist.   Echocardiogram 05/20/18:  Study Conclusions - Left ventricle: The cavity size was normal. Wall thickness was normal. Systolic function was normal. The estimated ejection fraction was in the range of60% to 65%. Hypokinesisof the mid-apicalinferolateral myocardium; consistent with ischemia in the distribution of the right coronary or left circumflex coronary artery. Doppler parameters are consistent with abnormal left ventricular relaxation (grade 1 diastolic dysfunction). - Mitral valve: There was mild regurgitation.  Patient Profile    77 y.o.femalewith a hx of peripheral vascular disease status post left common iliac artery stenting followed by [place] vascular vein, bilateral carotid endarterectomy, hypertension and abdominal aortic aneurysmwho is being seen for NSTEMI. Cath showed severe multi-vessel disease.   Assessment & Plan    1. Non-STEMI - Occurred on 05/17/2018 in the setting of acute ulceration of thoracic aorta.  - Troponin peaked to 7. - Cath on 05/19/2018 showed severe multi-vessel disease. Poor targets for PCI intervention. Seen by Dr. Morton Peters and felt medical management was the best option. - Echo showed LVEF of 60-65% with hypokinesis of mid-apicalinferolateral myocardium, and grade 1 diastolic dysfunction. - Continue ASA, Rosuvastatin 20mg  daily, beta blocker, and Imdur.  2. Acute Ulceration with Intramural Hematoma of the Thoracic Aorta - Repeat CTA chest this morning. Read pending.   3. 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 PAD -  S/p left common iliac artery stenting.  5. Hypertension - BP 124/55 this morning. - Metoprolol was increased to 75mg  twice daily yesterday for intermittent tachycardic (mostly with ambulation). Patient continues to have intermittent episodes of tachycardia. May consider increasing Metoprolol to 100mg  twice daily. - Continue Amlodipine 10mg  daily.  6. AAA  - Slightly larger than previously, now at 4cm.  7. Pleural Effusion - Per primary team  For questions or updates, please contact [facility] Please consult [email or web address] for contact info under        Signed, Corrin Parker, PA-C  05/27/2018, 7:42 AM    I have personally seen and examined this patient with Marjie Skiff, PA-C. I agree with the assessment and plan as outlined above. 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 No chest pain on current medical therapy which includes ASA, statin, beta blocker and Imdur. If she were to develop angina, could consider high risk PCI. Agree that her beta blocker can be titrated further for heart rate control. Dr. Randie Heinz is following for thoracic aorta ulceration/hematoma. No new recommendations today from a cardiac standpoint.   Verne Carrow 05/27/2018 10:25 AM

## 2018-05-27 NOTE — Progress Notes (Signed)
PROGRESS NOTE    Elizabeth [name]  ZOX:[id number] DOB: [birth year] DOA: 05/17/2018 PCP: Kari Baars, MD   Brief Narrative:  HPI on 05/17/2018 by Dr. Warren Lacy (PCCM) This is a 77 year old with a history of peripheral vascular disease who had the sudden onset of severe back pain this morning with radiation around both flanks just below the ribs.  The pain was noncolicky and nonpleuritic.  There is  no associated chest pain or shortness of breath.  She was making coffee at the time of onset there have been no unusual exertion.  A CTA of the chest is shown to ulcerations of the descending aorta and she was referred from [place] to here for definitive care.  Interim history Admitted for descending thoracic aortic aneurysm and is currently being followed by vascular surgery.  Also noted to have an NSTEMI followed by cardiology. 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  New and enlarging penetrating ulcerations suggesting evolution from an intramural hematoma and to an aortic dissection. -Vascular surgery would like to continue to monitor patient and obtain repeat CT scan in a few days. Continue to control BP. 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  -Pending further recommendations from vascular surgery.  NSTEMI -Cardiology consulted and appreciated, troponin peaked to 7 -Left heart catheterization on 05/20/2018 with total occlusions of the circumflex and RCA, single remaining conduit to supply LAD -Patient is not a PCI candidate as per cardiology -Cardiothoracic surgery consulted and appreciated, patient will be  high risk -Continue aspirin, statin, metoprolol  -heparin discontinued -Echocardiogram: EF 60 to 65%, hypokinesis of the mid apical inferior lateral myocardium.  Grade 1 diastolic dysfunction -Vein mapping ordered by surgery -Discussed surgery with patient, she does not feel that she wants to go through CABG surgery due to pain and risks.  -Plan for medical management per cardiology -Discussed with vascular surgery, will restart plavix.   Pleural effusion -Patient noted to have development of a small to moderate sized left pleural effusion -Chest x-ray ordered for later today -Currently patient has no shortness of breath  Essential hypertension -Patient presented with hypertensive urgency and required drips for blood pressure control -Currently blood pressure is stable, continue Norvasc, Lopressor  Chronic left hip pain -Continue OxyContin, Tylenol as needed  History of nephrolithiasis -Continue potassium citrate  Former tobacco abuser -PFTs have been ordered  PAD -Status post left common iliac artery stenting -CTA neck showed atheromatous change along great vessel origins, likely flow reducing involving the left subclavian -Vascular surgery following -Continue medications as above  Physical deconditioning -PT consulted, recommended HH -Case management consulted  Goals of care/Code status -Dicussed code status with patient, she prefers to be DNR, no compressions or intubation -she wishes to be comfortable and with her family  DVT Prophylaxis  Heparin drip discontinued, placed on lovenox   Code Status: DNR  Family Communication: None at bedside  Disposition Plan: Admitted. Dispo home with home health when medically stable. Pending further recommendations from vascular.  Consultants PCCM Cardiology Vascular surgery Cardiothoracic surgery  Procedures  Echocardiogram Carotid Doppler Left heart catheterization  Antibiotics   Anti-infectives (From admission,  onward)   None      Subjective:   Elizabeth [name]  Elizabeth [name] seen and examined today.  Feels back and hip pain are stable and tolerable.  Denies current chest pain, shortness breath, abdominal pain, nausea vomiting, diarrhea constipation.  Objective:   Vitals:   05/26/18 2010 05/27/18 0456 05/27/18 0500 05/27/18 0826  BP: (!) 133/54 (!) 124/55  (!) 141/70  Pulse: 85 70  87  Resp:      Temp: 98.7 F (37.1 C) 99 F (37.2 C)    TempSrc: Oral Oral    SpO2: 93% 91%    Weight:   61.7 kg   Height:        Intake/Output Summary (Last 24 hours) at 05/27/2018 1306 Last data filed at 05/26/2018 1857 Gross per 24 hour  Intake 444 ml  Output -  Net 444 ml   Filed Weights   05/25/18 0532 05/26/18 0625 05/27/18 0500  Weight: 60.1 kg 61.3 kg 61.7 kg   Exam  General: Well developed, well nourished, NAD, appears stated age  HEENT: NCAT, mucous membranes moist.   Neck: Supple  Cardiovascular: S1 S2 auscultated,RRR, 2/6SEM  Respiratory: Clear to auscultation bilaterally with equal chest rise  Abdomen: Soft, nontender, nondistended, + bowel sounds  Extremities: warm dry without cyanosis clubbing or edema  Neuro: AAOx3, nonfocal  Psych: Appropriate mood and affect, pleasant   Data Reviewed: I have personally reviewed following labs and imaging studies  CBC: Recent Labs  Lab 05/23/18 0458 05/24/18 0558 05/25/18 0456 05/26/18 0432 05/27/18 0428  WBC 5.1 6.1 5.5 5.6 5.3  HGB 11.7* 12.2 10.9* 10.9* 11.0*  HCT 36.0 37.0 34.9* 35.2* 35.4*  MCV 101.7* 102.2* 103.9* 103.8* 104.4*  PLT 214 240 263 298 334   Basic Metabolic Panel: Recent Labs  Lab 05/21/18 0430 05/22/18 0614 05/25/18 0456 05/26/18 0432  NA 139 138 140 139  K 3.8 4.0 3.9 4.2  CL 104 102 104 105  CO2 26 28 30 29   GLUCOSE 93 101* 103* 100*  BUN 14 10 13 13   CREATININE 0.84 0.76 0.83 0.81  CALCIUM 9.0 8.8* 9.0 8.8*  PHOS 3.1  --   --   --    GFR: Estimated Creatinine Clearance: 50.6 mL/min (by C-G formula  based on SCr of 0.81 mg/dL). Liver Function Tests: Recent Labs  Lab 05/21/18 0430  ALBUMIN 2.8*   No results for input(s): LIPASE, AMYLASE in the last 168 hours. No results for input(s): AMMONIA in the last 168 hours. Coagulation Profile: No results for input(s): INR, PROTIME in the last 168 hours. Cardiac Enzymes: Recent Labs  Lab 05/21/18 0430  TROPONINI 4.14*   BNP (last 3 results) No results for input(s): PROBNP in the last 8760 hours. HbA1C: No results for input(s): HGBA1C in the last 72 hours. CBG: No results for input(s): GLUCAP in the last 168 hours. Lipid Profile: Recent Labs    05/27/18 0428  TRIG 152*   Thyroid Function Tests: No results for input(s): TSH, T4TOTAL, FREET4, T3FREE, THYROIDAB in the last 72 hours. Anemia Panel: No results for input(s): VITAMINB12, FOLATE, FERRITIN, TIBC, IRON, RETICCTPCT in the last 72 hours. Urine analysis:    Component Value Date/Time   COLORURINE YELLOW (A) 05/17/2018 1032   APPEARANCEUR CLEAR (A) 05/17/2018 1032   LABSPEC 1.017 05/17/2018 1032   PHURINE 7.0 05/17/2018 1032   GLUCOSEU NEGATIVE 05/17/2018 1032   HGBUR NEGATIVE 05/17/2018 1032   BILIRUBINUR NEGATIVE 05/17/2018 1032   KETONESUR NEGATIVE 05/17/2018 1032   PROTEINUR 30 (A) 05/17/2018 1032   UROBILINOGEN 0.2 01/01/2011 2129   NITRITE NEGATIVE 05/17/2018  1032   LEUKOCYTESUR NEGATIVE 05/17/2018 1032   Sepsis Labs: @LABRCNTIP (procalcitonin:4,lacticidven:4)  ) Recent Results (from the past 240 hour(s))  MRSA PCR Screening     Status: None   Collection Time: 05/17/18  5:30 PM  Result Value Ref Range Status   MRSA by PCR NEGATIVE NEGATIVE Final    Comment:        The GeneXpert MRSA Assay (FDA approved for NASAL specimens only), is one component of a comprehensive MRSA colonization surveillance program. It is not intended to diagnose MRSA infection nor to guide or monitor treatment for MRSA infections. Performed at [facility], [address], [place], [place] [address]   Surgical pcr screen     Status: None   Collection Time: 05/20/18  6:10 PM  Result Value Ref Range Status   MRSA, PCR NEGATIVE NEGATIVE Final   Staphylococcus aureus NEGATIVE NEGATIVE Final    Comment: (NOTE) The Xpert SA Assay (FDA approved for NASAL specimens in patients 29 years of age and older), is one component of a comprehensive surveillance program. It is not intended to diagnose infection nor to guide or monitor treatment. Performed at [facility], 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 EXAM: CT ANGIOGRAPHY CHEST, ABDOMEN AND PELVIS TECHNIQUE: Multidetector CT imaging through the chest, abdomen and pelvis was performed using the standard protocol during bolus administration of intravenous contrast. Multiplanar reconstructed images and MIPs were obtained and reviewed to evaluate the vascular anatomy. CONTRAST:  ISOVUE-370 IOPAMIDOL (ISOVUE-370) INJECTION 76% COMPARISON:  Prior CT arteriogram 05/17/2018 and 05/23/2018 FINDINGS: CTA CHEST FINDINGS Cardiovascular: 4 vessel aortic arch. The left vertebral artery arises directly from the aorta. There is a high-grade stenosis of the proximal left subclavian artery secondary to primarily fibrofatty atherosclerotic plaque. Elongation of the aortic infundibulum and proximal descending thoracic aorta resulting in a type 3 arch. The aortic root, sino-tubular junction and ascending thoracic aorta are within normal limits. Redemonstration of a multifocal penetrating aortic ulcers with associated stable intramural hematoma and mild aneurysmal dilatation of the descending thoracic aorta. The most proximal descending thoracic aorta measures a maximum of 3.4 cm. The mid descending  thoracic aorta measures a maximal of 3.7 cm. The distal descending thoracic aorta measures up to 4.2 cm. Overall, the findings are stable compared to 05/23/2018. Heterogeneous mixed atherosclerotic plaque throughout the aorta. Normal caliber pulmonary arteries. No evidence of pulmonary embolus. The heart is normal in size. No pericardial effusion. Calcifications are present along the coronary arteries. Mediastinum/Nodes: Unremarkable CT appearance of the thyroid gland. No suspicious mediastinal or hilar adenopathy. No soft tissue mediastinal mass. The thoracic esophagus is unremarkable. Lungs/Pleura: Small left-sided pleural effusion with associated left lower lobe atelectasis. Calcified granuloma in the left upper lobe. Mild biapical pleuroparenchymal scarring. Second small calcified granuloma in the left upper lobe. Several subpleural pulmonary lymph nodes remain unchanged. No suspicious pulmonary mass or nodule. Musculoskeletal: No acute fracture or aggressive appearing lytic or blastic osseous lesion. Review of the MIP images confirms the above findings. CTA ABDOMEN AND PELVIS FINDINGS VASCULAR Aorta: Heterogeneous and irregular atherosclerotic plaque extends throughout the abdominal aorta. There is fusiform aneurysmal dilatation of the infrarenal abdominal aorta with a maximal transverse diameter of 4.2 cm. Celiac: Heterogeneous atherosclerotic plaque results in focal moderate to high-grade stenosis of the  origin of the celiac artery. No evidence of dissection or aneurysm. SMA: Predominantly calcified atherosclerotic plaque results in focal moderate stenosis of the proximal superior mesenteric artery. No dissection or aneurysm. Renals: Solitary bilateral renal arteries. Critical stenosis at the origin of the right renal artery which is likely chronic in nature given the overall right renal atrophy. Calcified atherosclerotic plaque results in moderate stenosis of the origin of the left renal artery. IMA: Patent.   Normal in caliber. Inflow: Heavily calcified atherosclerotic plaque at the aortic bifurcation. There may be mild stenosis of the origin of the right common iliac artery. An approximately 8 x 60 mm metal stent is present on the left extending from the common into the external iliac artery. The proximal landing zone is deflected medially and partially penetrates through the common iliac wall. This has allowed for buildup of thrombus versus plaque between the lateral margin of the stent and the calcified lateral margin of the arterial wall. Because the stent is partially within the arterial lumen and partially in the subintimal plane, there is a resultant relatively high-grade stenosis were the lumen of the artery and the stent communicate (please see coronal reformatted images). Both internal iliac arteries remain patent. The left external iliac artery is relatively small in caliber measuring only approximately 4 mm. On the right, the external iliac artery measures up to 5 mm. The left common femoral artery is also small. Veins: No focal venous abnormality. Review of the MIP images confirms the above findings. NON-VASCULAR Hepatobiliary: Normal hepatic contour and morphology. No discrete hepatic lesions. Normal appearance of the gallbladder. No intra or extrahepatic biliary ductal dilatation. Pancreas: Unremarkable. No pancreatic ductal dilatation or surrounding inflammatory changes. Spleen: Normal in size without focal abnormality. Adrenals/Urinary Tract: Normal adrenal glands. Atrophic right kidney likely secondary to chronic ischemia. 3-4 mm nonobstructing left upper pole nephrolithiasis. 1.4 cm simple cyst arising from the lower pole of the left kidney. There are a few areas of focal renal cortical thinning consistent with scarring no enhancing renal mass. Unremarkable ureters and bladder. Stomach/Bowel: Colonic diverticular disease without CT evidence of active inflammation. No focal bowel wall thickening or  evidence of obstruction. Lymphatic: No suspicious lymphadenopathy. Reproductive: Uterus and bilateral adnexa are unremarkable. Other: Small fat containing umbilical hernia. Omental fat containing left femoral hernia. Additionally, there is a dystrophic calcification in the inferior aspect of the hernia likely representing a region of prior omental infarction. Musculoskeletal: No acute fracture or aggressive appearing lytic or blastic osseous lesion. Severe left hip joint degenerative osteoarthritis. Focal L2-L3 degenerative disc disease. Remote compression fracture involving the inferior endplate of T12. Approximately 40% height loss anteriorly. Review of the MIP images confirms the above findings. IMPRESSION: CTA CHEST 1. 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 The affected segment of the aorta remains mildly aneurysmal with a maximal diameter of 4.2 cm. 2. 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 3. Coronary artery calcifications. 4. Old granulomatous disease in the left upper lobe. CTA ABD/PELVIS 1. Fusiform aneurysmal dilatation of the infrarenal abdominal aorta with a maximal diameter of 4.2 cm. 2. Partially malpositioned left iliac stent. The proximal landing zone has partially eroded into the subintimal space medially resulting in a fairly high-grade stenosis at the interface between the arterial lumen and the stent (please see coronal reformatted images). The stent remains patent, however the left external iliac and common femoral arteries are  relatively small in caliber (4 mm) likely due in part to diminished flow. 3. 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 4. 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 5. Moderate stenosis of the origin of the superior mesenteric  artery. 6. Mild stenosis of the origin of the right common iliac artery. 7. Moderate omental fat containing left femoral hernia. 8. Extensive colonic diverticulosis without evidence of active diverticulitis. 9. Severe left hip joint degenerative osteoarthritis. 10. Remote T12 compression fracture with approximately 40% height loss. 11. Multifocal left renal cortical scarring. Signed, Sterling Big, MD, RPVI [facility] Electronically Signed   By: Malachy Moan M.D.   On: 05/27/2018 10:02     Scheduled Meds: . amLODipine  10 mg Oral Daily  . aspirin EC  81 mg Oral Daily  . docusate sodium  100 mg Oral Daily  . enoxaparin (LOVENOX) injection  40 mg Subcutaneous Q24H  . famotidine  20 mg Oral BID  . iopamidol      . isosorbide mononitrate  15 mg Oral Daily  . mouth rinse  15 mL Mouth Rinse BID  . metoprolol tartrate  75 mg Oral BID  . oxyCODONE  10 mg Oral Q12H  . polyethylene glycol  17 g Oral Daily  . potassium citrate  10 mEq Oral BID WC  . rosuvastatin  20 mg Oral q1800  . sodium chloride flush  3 mL Intravenous Q12H   Continuous Infusions: . sodium chloride    . lactated ringers 182 mL/hr at 05/19/18 1500     LOS: 10 days   Time Spent in minutes   30 minutes  Starlynn Klinkner D.O. on 05/27/2018 at 1:06 PM  Between 7am to 7pm - Please see pager noted on [email or web address]  After 7pm go to [email or web address]  And look for the night coverage person covering for me after hours  [facility] Office  [phone]

## 2018-05-27 NOTE — Progress Notes (Signed)
  Progress Note    05/27/2018 4:22 PM 8 Days Post-Op  Subjective: No chest back or abdominal pain  Vitals:   05/27/18 0826 05/27/18 1422  BP: (!) 141/70 (!) 147/65  Pulse: 87 76  Resp:  17  Temp:    SpO2:      Physical Exam: Awake alert oriented Moving all extremities well Abdomen is soft nontender nondistended  CBC    Component Value Date/Time   WBC 5.3 05/27/2018 0428   RBC 3.39 (L) 05/27/2018 0428   HGB 11.0 (L) 05/27/2018 0428   HGB 15.3 10/02/2012 0608   HCT 35.4 (L) 05/27/2018 0428   HCT 44.6 10/02/2012 0608   PLT 334 05/27/2018 0428   PLT 262 10/02/2012 0608   MCV 104.4 (H) 05/27/2018 0428   MCV 101 (H) 10/02/2012 0608   MCH 32.4 05/27/2018 0428   MCHC 31.1 05/27/2018 0428   RDW 11.9 05/27/2018 0428   RDW 12.3 10/02/2012 0608   LYMPHSABS 1.6 10/02/2012 0608   MONOABS 0.6 10/02/2012 0608   EOSABS 0.0 10/02/2012 0608   BASOSABS 0.0 10/02/2012 0608    BMET    Component Value Date/Time   NA 139 05/26/2018 0432   NA 137 10/02/2012 0608   K 4.2 05/26/2018 0432   K 3.5 10/02/2012 0608   CL 105 05/26/2018 0432   CL 105 10/02/2012 0608   CO2 29 05/26/2018 0432   CO2 24 10/02/2012 0608   GLUCOSE 100 (H) 05/26/2018 0432   GLUCOSE 124 (H) 10/02/2012 0608   BUN 13 05/26/2018 0432   BUN 8 10/02/2012 0608   CREATININE 0.81 05/26/2018 0432   CREATININE 0.84 10/02/2012 0608   CALCIUM 8.8 (L) 05/26/2018 0432   CALCIUM 8.7 10/02/2012 0608   GFRNONAA >60 05/26/2018 0432   GFRNONAA >60 09/08/2012 1457   GFRAA >60 05/26/2018 0432   GFRAA >60 09/08/2012 1457    INR    Component Value Date/Time   INR 0.98 05/17/2018 1648   INR 1.0 10/02/2012 0608     Intake/Output Summary (Last 24 hours) at 05/27/2018 1622 Last data filed at 05/26/2018 1857 Gross per 24 hour  Intake 222 ml  Output -  Net 222 ml   I have independently interpreted her CT angios which demonstrates 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  Assessment:  77 y.o. female is s/p here with thoracic IMH.  CT scan reviewed today.  Plan: Okay for discharge tomorrow.  We will have her follow-up in 3 to 4 weeks with repeat CT scan.  She will likely need repair which will require iliac conduit.  Okay for aspirin and Plavix from vascular standpoint to continue.  I discussed expected course with her and she knows to seek emergent medical attention should she have increasing chest, back or abdominal pain.   Brieann Osinski C. Randie Heinz, MD [facility] Office: [phone] Pager: [phone]  05/27/2018 4:22 PM

## 2018-05-27 NOTE — Progress Notes (Signed)
CARDIAC REHAB PHASE I   PRE:  Rate/Rhythm: 79 SR  BP:  Supine:   Sitting: 138/65  Standing:    SaO2: 93%RA  MODE:  Ambulation: 120 ft   POST:  Rate/Rhythm: 93 SR  BP:  Supine:   Sitting: 147/65  Standing:    SaO2: 93%RA 1407-1437 Pt walked 120 ft on RA with rolling walker with minimal asst. Tolerated well.  To bed after walk. No c/o CP.   Luetta Nutting, RN BSN  05/27/2018 2:33 PM

## 2018-05-28 LAB — CBC
HCT: 33.2 % — ABNORMAL LOW (ref 36.0–46.0)
Hemoglobin: 10.6 g/dL — ABNORMAL LOW (ref 12.0–15.0)
MCH: 33 pg (ref 26.0–34.0)
MCHC: 31.9 g/dL (ref 30.0–36.0)
MCV: 103.4 fL — ABNORMAL HIGH (ref 80.0–100.0)
PLATELETS: 345 10*3/uL (ref 150–400)
RBC: 3.21 MIL/uL — ABNORMAL LOW (ref 3.87–5.11)
RDW: 11.9 % (ref 11.5–15.5)
WBC: 5.7 10*3/uL (ref 4.0–10.5)
nRBC: 0 % (ref 0.0–0.2)

## 2018-05-28 MED ORDER — METOPROLOL TARTRATE 100 MG PO TABS: 100.0000 mg | ORAL_TABLET | Freq: Two times a day (BID) | ORAL | Status: DC

## 2018-05-28 MED ORDER — ISOSORBIDE MONONITRATE ER 30 MG PO TB24: 15.0000 mg | ORAL_TABLET | Freq: Every day | ORAL | 0 refills | Status: DC

## 2018-05-28 MED ORDER — METOPROLOL TARTRATE 50 MG PO TABS: 100.0000 mg | ORAL_TABLET | Freq: Two times a day (BID) | ORAL | 0 refills | Status: DC

## 2018-05-28 MED ORDER — POLYETHYLENE GLYCOL 3350 17 G PO PACK: 17.0000 g | PACK | Freq: Every day | ORAL | 0 refills | Status: DC

## 2018-05-28 NOTE — Discharge Summary (Signed)
Physician Discharge Summary  Elizabeth [name] ZOX:[id number] DOB: 06/29/1941 DOA: 05/17/2018  PCP: Elizabeth Baars, MD  Admit date: 05/17/2018 Discharge date: 05/28/2018  Time spent: 35 minutes  Recommendations for Outpatient Follow-up:  1. Follow up outpatient CBC/CMP 2. Follow up with vascular surgery as an outpatient, this will need to be scheduled 3. Follow up HR as outpatient, given metoprolol as an outpatient 4. Follow up with cardiology as an outpatient as scheduled   Discharge Diagnoses:  Principal Problem:   Non-ST elevation (NSTEMI) myocardial infarction [facility]) Active Problems:   Peripheral vascular disease (HCC)   Hypertension   Aortic dissection (HCC)   Hypoxemia   Penetrating atherosclerotic ulcer of aorta (HCC)   Chest pain   Coronary artery disease involving native heart without angina pectoris   Pleural effusion   Discharge Condition: stable  Diet recommendation: heart healthy  Filed Weights   05/26/18 0625 05/27/18 0500 05/28/18 0527  Weight: 61.3 kg 61.7 kg 62.7 kg    History of present illness:  HPI on 05/17/2018 by Elizabeth [name] (PCCM) This is Elizabeth [name] 77 year old with Elizabeth [name] history of peripheral vascular disease who had the sudden onset of severe back pain this morning with radiation around both flanks just below the ribs. The pain was noncolicky and nonpleuritic. There is no associated chest pain or shortness of breath. She was making coffee at the time of onset there have been no unusual exertion. Elizabeth [name] CTA of the chest is shown to ulcerations of the descending aorta and she was referred from  to here for definitive care.  Interim history Admitted for descending thoracic aortic aneurysm and is currently being followed by vascular surgery.  Also noted to have an NSTEMI followed by cardiology.  She was followed by vascular surgery for her thoracic intramural hematoma.  This was noted to be stable on repeat CT on day prior to discharge.  For her NSTEMI,  medical management was recommended by cardiology and CT surgery.    Hospital Course:  Descending thoracic aortic aneurysm  Intramural Hematoma -Vascular surgery consulted and appreciated, CTA dissection protocol: She's had several CT's over the past several weeks, the most recent demonstrating stable appearance of multifocal penetrating aortic ulcers with associated intramural hemtoma bordering on Elizabeth [name] descending thoracic aortic dissection.  Based on most recent imaging, pt stable for discharge from vascular standpoint with plan for follow up in 3-4 weeks.  NSTEMI -Cardiology consulted and appreciated, troponin peaked to 7 -Left heart catheterization on 05/20/2018 with total occlusions of the circumflex and RCA, single remaining conduit to supply LAD -Patient is not Elizabeth [name] PCI candidate as per cardiology -Cardiothoracic surgery consulted and appreciated, seen by Dr. Donata Clay who recommended medical therapy -Continue aspirin, statin, metoprolol, plavix -Echocardiogram: EF 60 to 65%, hypokinesis of the mid apical inferior lateral myocardium (see below)  Grade 1 diastolic dysfunction -Vein mapping ordered by surgery -Discussed surgery with patient, she does not feel that she wants to go through CABG surgery due to pain and risks.  -Plan for medical management per cardiology -Discussed with vascular surgery, will restart plavix.   Carotid Artery Disease  PAD: pt s/p carotid endarterectomy.  Patent R CEA on R.  With 80-99% stenosis on L on [place].  CT angio of neck showed no significant carotid stenosis, but did show atheromatous change all great vessel origins, likely flow reducing involving L subclavian.   Follow up with vascular surgery.  Status post left common iliac artery stenting.  Pleural effusion - CXR from 10/19 with effusion  noted, repeat CT chest on 10/22, it is noted to be [name] small L sided effusion - she's currently asymptomatic, recommending continued outpatient monitoring - discussed with  pt  Essential hypertension -Patient presented with hypertensive urgency and required drips for blood pressure control -Currently blood pressure is improved, continue amlodipine, metoprolol (dose increased given tachycardia noted today)  Chronic left hip pain -resume home pain meds at discharge, Tylenol as needed  History of nephrolithiasis -Continue potassium citrate  Former tobacco abuser -PFTs have been ordered and show mild obstructive airway disease, follow outpatient  Physical deconditioning -PT consulted, recommended HH -Case management consulted  Goals of care/Code status - Per previous provider, they discussed code status with patient and per their documentation, she prefers to be DNR, no compressions or intubation -she wishes to be comfortable and with her family  Procedures: Echo Study Conclusions  - Left ventricle: The cavity size was normal. Wall thickness was   normal. Systolic function was normal. The estimated ejection   fraction was in the range of 60% to 65%. Hypokinesis of the   mid-apicalinferolateral myocardium; consistent with ischemia in   the distribution of the right coronary or left circumflex   coronary artery. Doppler parameters are consistent with abnormal   left ventricular relaxation (grade 1 diastolic dysfunction). - Mitral valve: There was mild regurgitation.  ABI Right ABI: Resting right ankle-brachial index is within normal range. No evidence of significant right lower extremity arterial disease. Left ABI: Resting left ankle-brachial index indicates moderate left lower extremity arterial disease.  Cath  Total occlusion of the mid to distal RCA.  Distal RCA fills via well-formed collaterals around the left ventricular apex from the LAD.  Total occlusion of [name] relatively small circumflex.  Minimal collaterals are noted.  Ostial 60% left main with pressure damping noted during engagement.  At least 60% distal left main is  noted.  Heavily calcified 80% proximal LAD followed by eccentric calcified slitlike 70% mid LAD stenosis.  Normal LV function with EF 55%.  RECOMMENDATIONS:   The patient has Blas Riches single remaining conduit which is the left main coronary artery supplying the LAD.  Both the circumflex and right coronary are occluded.  The distal left main, proximal LAD is aneurysmal and heavily calcified.  The LAD supplies well-formed collaterals to the PDA.  Recommend evaluation for potential surgical revascularization.  No appealing interventional options exist.  Recommend Aspirin 81mg  daily for moderate CAD.  Carotid Dopplers Summary: Right Carotid: There is no evidence of stenosis in the right ICA. Patent CEA.  Left Carotid: Velocities in the left ICA are consistent with Helga Asbury 80-99% stenosis.       Significant acoustic shadowing and the bifurcation.  Vertebrals: Bilateral vertebral arteries demonstrate antegrade flow. Subclavians: Normal flow hemodynamics were seen in bilateral subclavian       arteries.   Consultations:  Cardiology  Vascular  CT Surgery  PCCM  Discharge Exam: Vitals:   05/28/18 0527 05/28/18 0841  BP: (!) 131/59 (!) 149/68  Pulse: 72 (!) 104  Resp:    Temp: 98.4 F (36.9 C)   SpO2: 94%    Doing well, looking forward to going home. No complaints.  General: No acute distress. Cardiovascular: Heart sounds show Purnell Daigle regular rate, and rhythm. Mildly tachy. Lungs: Clear to auscultation bilaterally  Abdomen: Soft, nontender, nondistended  Neurological: Alert and oriented 3. Moves all extremities 4. Cranial nerves II through XII grossly intact. Skin: Warm and dry. No rashes or lesions. Extremities: No clubbing or cyanosis. No  edema. Psychiatric: Mood and affect are normal. Insight and judgment are appropriate.   Discharge Instructions   Discharge Instructions    Amb Referral to Cardiac Rehabilitation   Complete by:  As directed    Diagnosis:   NSTEMI   Call MD for:  difficulty breathing, headache or visual disturbances   Complete by:  As directed    Call MD for:  extreme fatigue   Complete by:  As directed    Call MD for:  persistant dizziness or light-headedness   Complete by:  As directed    Call MD for:  persistant nausea and vomiting   Complete by:  As directed    Call MD for:  redness, tenderness, or signs of infection (pain, swelling, redness, odor or green/yellow discharge around incision site)   Complete by:  As directed    Call MD for:  severe uncontrolled pain   Complete by:  As directed    Call MD for:  temperature >100.4   Complete by:  As directed    Diet - low sodium heart healthy   Complete by:  As directed    Discharge instructions   Complete by:  As directed    You were seen for an intramural hematoma.  Vascular surgery is planning on following up with this as an outpatient.  Return for any new or recurrent symptoms or pain.  You also had an NSTEMI (heart attack).  You were seen by cardiology and cardiothoracic surgery who recommended continued medical management with aspirin, plavix, rosuvastatin, metoprolol, and imdur.  Please follow up with cardiology as an outpatient.  Please schedule an appointment with Dr. Juanetta Gosling within the next week.  You should follow up this hospitalization and the plans going forward.  You should follow up your pleural effusion (fluid on the lungs) to determine if this should be sampled at some point or if it can be monitored.  Return for new, recurrent, or worsening symptoms.  Please ask your PCP to request records from this hospitalization so they know what was done and what the next steps will be.   Increase activity slowly   Complete by:  As directed      Allergies as of 05/28/2018      Reactions   Codeine Other (See Comments)   Reaction: Unsure   Sulfa Antibiotics Other (See Comments)   Mouth blisters      Medication List    TAKE these medications   amLODipine 10  MG tablet Commonly known as:  NORVASC Take 10 mg by mouth daily.   aspirin 81 MG EC tablet Take 1 tablet (81 mg total) by mouth daily. What changed:  additional instructions   cholecalciferol 1000 units tablet Commonly known as:  VITAMIN D Take 1,000 Units by mouth daily.   clopidogrel 75 MG tablet Commonly known as:  PLAVIX Take 75 mg by mouth daily.   GOODY HEADACHE PO Take 1 packet by mouth daily as needed (headache).   HYDROcodone-acetaminophen 10-325 MG tablet Commonly known as:  NORCO Take 1 tablet by mouth every 6 (six) hours as needed.   isosorbide mononitrate 30 MG 24 hr tablet Commonly known as:  IMDUR Take 0.5 tablets (15 mg total) by mouth daily. Start taking on:  05/29/2018   metoprolol tartrate 50 MG tablet Commonly known as:  LOPRESSOR Take 2 tablets (100 mg total) by mouth 2 (two) times daily. What changed:  how much to take   multivitamin tablet Take 1 tablet by mouth daily.  polyethylene glycol packet Commonly known as:  MIRALAX / GLYCOLAX Take 17 g by mouth daily. Start taking on:  05/29/2018   potassium citrate 10 MEQ (1080 MG) SR tablet Commonly known as:  UROCIT-K Take 10 mEq by mouth 2 (two) times daily.   rosuvastatin 20 MG tablet Commonly known as:  CRESTOR Take 20 mg by mouth daily.   VITAMIN B-12 PO Take 1 tablet by mouth daily.      Allergies  Allergen Reactions  . Codeine Other (See Comments)    Reaction: Unsure  . Sulfa Antibiotics Other (See Comments)    Mouth blisters   Follow-up Information    [facility]-Home Follow up.   Specialty:  Home Health Services Why:  Registered Nurse, Physical Therapy Contact information: [address] [place] [address] [phone]        [facility] - Dme Follow up.   Why:  3n1, Rolling Walker, Paediatric nurse.  Contact information: [address] [place] [address] [phone]        Abelino Derrick, [place]. Go on 06/05/2018.    Specialties:  Cardiology, Radiology Why:  @2 :30pm for cardiology follow up with Dr. Hazle Coca PA Please arrive 15 minutes early  Contact information: [address] [place] [address] [phone]            The results of significant diagnostics from this hospitalization (including imaging, microbiology, ancillary and laboratory) are listed below for reference.    Significant Diagnostic Studies: Dg Chest 2 View  Result Date: 05/24/2018 CLINICAL DATA:  Aortic aneurysm by perforated ulcers. EXAM: CHEST - 2 VIEW COMPARISON:  05/23/2018 FINDINGS: Normal heart size and vascularity. Aorta is atherosclerotic and mildly ectatic. Left lower lobe collapse/consolidation noted with Aletheia Tangredi posterior fusion. Difficult to exclude basilar pneumonia. Right lung remains clear. Left upper lobe calcified granuloma noted. No pneumothorax. Trachea is midline. Bones are osteopenic. Chronic appearing T12 compression fracture abdominal of the inferior endplate IMPRESSION: Left lower lobe collapse/consolidation and associated effusion. Difficult to exclude pneumonia. Thoracic aortic atherosclerosis and ectasia by plain radiography. Osteopenia and degenerative changes Remote granulomatous disease Electronically Signed   By: Judie Petit.  Shick M.D.   On: 05/24/2018 14:19   Dg Chest 2 View  Result Date: 05/22/2018 CLINICAL DATA:  Recent myocardial infarction EXAM: CHEST - 2 VIEW COMPARISON:  05/17/2018 FINDINGS: Cardiac shadow is at the upper limits of normal in size. The lungs are well aerated bilaterally. Calcified granuloma is noted in the left apex stable from the prior exam. New left retrocardiac density is noted with associated effusion. No acute bony abnormality is noted. IMPRESSION: New left retrocardiac atelectasis with associated left-sided effusion. These changes somewhat are similar to the recent CTA of the neck. Electronically Signed   By: Alcide Clever M.D.   On: 05/22/2018 09:52   Ct Angio Neck W Or Wo  Contrast  Result Date: 05/21/2018 CLINICAL DATA:  Arterial stricture and occlusion. Follow-up stenosis. Non STEMI. Back pain. EXAM: CT ANGIOGRAPHY NECK TECHNIQUE: Multidetector CT imaging of the neck was performed using the standard protocol during bolus administration of intravenous contrast. Multiplanar CT image reconstructions and MIPs were obtained to evaluate the vascular anatomy. Carotid stenosis measurements (when applicable) are obtained utilizing NASCET criteria, using the distal internal carotid diameter as the denominator. CONTRAST:  90mL ISOVUE-370 IOPAMIDOL (ISOVUE-370) INJECTION 76% COMPARISON:  CTA neck 02/27/2012.  CT chest 05/17/2018. FINDINGS: Aortic arch: Direct origin of small LEFT vertebral from the arch. Extreme calcification at the  LEFT subclavian origin, suspected high-grade stenosis. Nonstenotic calcification at the origin of the LEFT common carotid, innominate, RIGHT common carotid, and RIGHT subclavian. Posterior penetrating ulcers of the thoracic aorta are observed. These measure 4 x 5 mm as seen on series 5, image 96, and 6 x 11 mm on series 5, image 109. Intramural hematoma redemonstrated. These appears slightly increased from prior recent chest CT. In addition, there is Charika Mikelson new LEFT pleural effusion, intermediate Hounsfield attenuation 15-20 HU, which was not present on 05/17/2018. Right carotid system: No evidence of dissection, stenosis (50% or greater) or occlusion. Reportedly, the patient is status post endarterectomy. Left carotid system: No evidence of dissection, stenosis (50% or greater) or occlusion. Reportedly, the patient is status post endarterectomy. Vertebral arteries: Dominant RIGHT vertebral. Ostial stenosis calcification of the RIGHT vertebral estimated 50%. No similar ostial stenosis of the LEFT vertebral. No clear stenosis in the neck. LEFT vertebral contributes directly to the LEFT PICA. Skeleton: Spondylosis. Other neck: No masses. Upper chest: New LEFT pleural  effusion. IMPRESSION: No significant carotid stenosis status post reportedly BILATERAL carotid endarterectomy. Marked improvement compared with 02/27/2012. 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 In addition, there is Omnia Dollinger new LEFT pleural effusion, of intermediate attenuation. Atheromatous change all great vessel origins, likely flow reducing involving the LEFT subclavian; see discussion above. These results were called by telephone at the time of interpretation on 05/21/2018 at 1:18 pm to Dr. Randie Heinz, Who verbally acknowledged these results. Electronically Signed   By: Elsie Stain M.D.   On: 05/21/2018 13:23   Dg Chest Port 1 View  Result Date: 05/23/2018 CLINICAL DATA:  Pleural effusion. EXAM: PORTABLE CHEST 1 VIEW COMPARISON:  Chest x-ray dated 05/22/2018. Chest CT dated 05/17/2018. FINDINGS: Stable veiled opacity at the LEFT lung base, compatible with mild atelectasis and/or small layering pleural effusion. RIGHT lung remains clear. No pneumothorax seen. Heart size is stable. Increased prominence of the LEFT perihilar shadow. IMPRESSION: 1. Increased prominence of the LEFT perihilar shadow, which could indicate increased thickening of the LEFT pulmonary artery walls due to underlying vasculitis and/or increasing fluid/hematoma adjacent to the central pulmonary arteries as suggested on recent chest CT angiogram of 05/17/2018. Recommend repeat chest CT angiogram for further characterization. 2. Stable veiled opacity at the LEFT lung base, compatible with small layering pleural effusion and/or atelectasis. 3.  Aortic Atherosclerosis (ICD10-I70.0). These results will be called to the ordering clinician or representative by the Radiologist Assistant, and communication documented in the PACS or zVision Dashboard. Electronically Signed   By: Bary Richard M.D.   On: 05/23/2018 08:49   Ct Angio Chest/abd/pel For Dissection W And/or W/wo  Result  Date: 05/27/2018 CLINICAL DATA:  77 year old female with 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 EXAM: CT ANGIOGRAPHY CHEST, ABDOMEN AND PELVIS TECHNIQUE: Multidetector CT imaging through the chest, abdomen and pelvis was performed using the standard protocol during bolus administration of intravenous contrast. Multiplanar reconstructed images and MIPs were obtained and reviewed to evaluate the vascular anatomy. CONTRAST:  ISOVUE-370 IOPAMIDOL (ISOVUE-370) INJECTION 76% COMPARISON:  Prior CT arteriogram 05/17/2018 and 05/23/2018 FINDINGS: CTA CHEST FINDINGS Cardiovascular: 4 vessel aortic arch. The left vertebral artery arises directly from the aorta. There is Ashaun Gaughan high-grade stenosis of the proximal left subclavian artery secondary to primarily fibrofatty atherosclerotic plaque. Elongation of the aortic infundibulum and proximal descending thoracic aorta resulting in Desire Fulp type 3 arch. The aortic root, sino-tubular junction and ascending thoracic aorta are  within normal limits. Redemonstration of Vaani Morren multifocal penetrating aortic ulcers with associated stable intramural hematoma and mild aneurysmal dilatation of the descending thoracic aorta. The most proximal descending thoracic aorta measures Floris Neuhaus maximum of 3.4 cm. The mid descending thoracic aorta measures Cozetta Seif maximal of 3.7 cm. The distal descending thoracic aorta measures up to 4.2 cm. Overall, the findings are stable compared to 05/23/2018. Heterogeneous mixed atherosclerotic plaque throughout the aorta. Normal caliber pulmonary arteries. No evidence of pulmonary embolus. The heart is normal in size. No pericardial effusion. Calcifications are present along the coronary arteries. Mediastinum/Nodes: Unremarkable CT appearance of the thyroid gland. No suspicious mediastinal or hilar adenopathy. No soft tissue mediastinal mass. The thoracic esophagus is unremarkable. Lungs/Pleura: Small left-sided pleural effusion with  associated left lower lobe atelectasis. Calcified granuloma in the left upper lobe. Mild biapical pleuroparenchymal scarring. Second small calcified granuloma in the left upper lobe. Several subpleural pulmonary lymph nodes remain unchanged. No suspicious pulmonary mass or nodule. Musculoskeletal: No acute fracture or aggressive appearing lytic or blastic osseous lesion. Review of the MIP images confirms the above findings. CTA ABDOMEN AND PELVIS FINDINGS VASCULAR Aorta: Heterogeneous and irregular atherosclerotic plaque extends throughout the abdominal aorta. There is fusiform aneurysmal dilatation of the infrarenal abdominal aorta with Ladainian Therien maximal transverse diameter of 4.2 cm. Celiac: Heterogeneous atherosclerotic plaque results in focal moderate to high-grade stenosis of the origin of the celiac artery. No evidence of dissection or aneurysm. SMA: Predominantly calcified atherosclerotic plaque results in focal moderate stenosis of the proximal superior mesenteric artery. No dissection or aneurysm. Renals: Solitary bilateral renal arteries. Critical stenosis at the origin of the right renal artery which is likely chronic in nature given the overall right renal atrophy. Calcified atherosclerotic plaque results in moderate stenosis of the origin of the left renal artery. IMA: Patent.  Normal in caliber. Inflow: Heavily calcified atherosclerotic plaque at the aortic bifurcation. There may be mild stenosis of the origin of the right common iliac artery. An approximately 8 x 60 mm metal stent is present on the left extending from the common into the external iliac artery. The proximal landing zone is deflected medially and partially penetrates through the common iliac wall. This has allowed for buildup of thrombus versus plaque between the lateral margin of the stent and the calcified lateral margin of the arterial wall. Because the stent is partially within the arterial lumen and partially in the subintimal plane, there  is Zackaria Burkey resultant relatively high-grade stenosis were the lumen of the artery and the stent communicate (please see coronal reformatted images). Both internal iliac arteries remain patent. The left external iliac artery is relatively small in caliber measuring only approximately 4 mm. On the right, the external iliac artery measures up to 5 mm. The left common femoral artery is also small. Veins: No focal venous abnormality. Review of the MIP images confirms the above findings. NON-VASCULAR Hepatobiliary: Normal hepatic contour and morphology. No discrete hepatic lesions. Normal appearance of the gallbladder. No intra or extrahepatic biliary ductal dilatation. Pancreas: Unremarkable. No pancreatic ductal dilatation or surrounding inflammatory changes. Spleen: Normal in size without focal abnormality. Adrenals/Urinary Tract: Normal adrenal glands. Atrophic right kidney likely secondary to chronic ischemia. 3-4 mm nonobstructing left upper pole nephrolithiasis. 1.4 cm simple cyst arising from the lower pole of the left kidney. There are Galya Dunnigan few areas of focal renal cortical thinning consistent with scarring no enhancing renal mass. Unremarkable ureters and bladder. Stomach/Bowel: Colonic diverticular disease without CT evidence of active inflammation. No focal bowel wall  thickening or evidence of obstruction. Lymphatic: No suspicious lymphadenopathy. Reproductive: Uterus and bilateral adnexa are unremarkable. Other: Small fat containing umbilical hernia. Omental fat containing left femoral hernia. Additionally, there is Obadiah Dennard dystrophic calcification in the inferior aspect of the hernia likely representing Vonette Grosso region of prior omental infarction. Musculoskeletal: No acute fracture or aggressive appearing lytic or blastic osseous lesion. Severe left hip joint degenerative osteoarthritis. Focal L2-L3 degenerative disc disease. Remote compression fracture involving the inferior endplate of T12. Approximately 40% height loss  anteriorly. Review of the MIP images confirms the above findings. IMPRESSION: CTA CHEST 1. Stable appearance of multifocal penetrating aortic ulcers with associated intramural hematoma bordering on Chastity Noland descending thoracic aortic dissection. The affected segment of the aorta remains mildly aneurysmal with Berklee Battey maximal diameter of 4.2 cm. 2. 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 3. Coronary artery calcifications. 4. Old granulomatous disease in the left upper lobe. CTA ABD/PELVIS 1. Fusiform aneurysmal dilatation of the infrarenal abdominal aorta with Ayslin Kundert maximal diameter of 4.2 cm. 2. Partially malpositioned left iliac stent. The proximal landing zone has partially eroded into the subintimal space medially resulting in Melah Ebling fairly high-grade stenosis at the interface between the arterial lumen and the stent (please see coronal reformatted images). The stent remains patent, however the left external iliac and common femoral arteries are relatively small in caliber (4 mm) likely due in part to diminished flow. 3. 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 4. 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 5. Moderate stenosis of the origin of the superior mesenteric artery. 6. Mild stenosis of the origin of the right common iliac artery. 7. Moderate omental fat containing left femoral hernia. 8. Extensive colonic diverticulosis without evidence of active diverticulitis. 9. Severe left hip joint degenerative osteoarthritis. 10. Remote T12 compression fracture with approximately 40% height loss. 11. Multifocal left renal cortical scarring. Signed, Sterling Big, MD, RPVI [facility] Radiology Electronically Signed   By: Malachy Moan M.D.   On: 05/27/2018 10:02   Ct Angio Chest/abd/pel For Dissection W And/or W/wo  Result Date:  05/24/2018 CLINICAL DATA:  77 year old with thoracic aortic disease and pre planning CT examination. 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 EXAM: CT ANGIOGRAPHY CHEST, ABDOMEN AND PELVIS TECHNIQUE: Multidetector CT imaging through the chest, abdomen and pelvis was performed using the standard protocol during bolus administration of intravenous contrast. Multiplanar reconstructed images and MIPs were obtained and reviewed to evaluate the vascular anatomy. CONTRAST:  ISOVUE-370 IOPAMIDOL (ISOVUE-370) INJECTION 76% COMPARISON:  05/17/2018 FINDINGS: CTA CHEST FINDINGS Cardiovascular: Again noted is extensive atherosclerotic disease involving the thoracic aorta. Again noted is Kamisha Ell penetrating ulcer along the proximal descending thoracic aorta on sequence 8, image 40. This penetrating ulcer is slightly more conspicuous than it was on the previous examination. There is Tiwanna Tuch the new large penetrating ulcer along the posterior proximal/mid descending thoracic aorta on sequence 8, image 51 measuring 1 cm in diameter. The size of the thoracic aorta is difficult to measure because there is now left pleural fluid. However, the size of the descending thoracic aorta has not significantly changed. Interestingly, the pleural fluid appears to markedly increase between the pre and postcontrast studies which is roughly 2 minutes. Pleural fluid adjacent to the aorta is low density. The mid descending thoracic aorta roughly measures 3.9 cm and minimally changed from the study on 05/17/2018. Large penetrating ulcer along  the posterior distal descending thoracic aorta measuring roughly 1.1 cm in diameter previously measuring 0.8 cm. Again noted is evidence for intramural hematoma within the distal descending thoracic aorta near the hiatus. Findings are concerning for evolution of the intramural hematoma and penetrating ulcers into Madeline Pho developing dissection involving the descending thoracic aorta. Main pulmonary arteries  are patent. Normal caliber of the aortic root and ascending thoracic aorta without dissection flap. Great vessels are patent. Extensive atherosclerotic disease involving the proximal left subclavian artery with areas of stenosis. Atherosclerotic disease involving the origin of left common carotid artery which is difficult to evaluate due to artifact from the adjacent contrast in the venous system. Previously, there was stranding or edema around the main pulmonary arteries which is less conspicuous on this examination. Heart size is normal without significant pericardial fluid. Coronary artery calcifications. Mediastinum/Nodes: No chest lymphadenopathy. Thyroid tissue is unremarkable. Lungs/Pleura: Trachea and mainstem bronchi are patent. New small left pleural effusion. Apparently, the left pleural fluid increases between the pre and post contrast images on this study. This is approximately Lanessa Shill 2 minute interval. The fluid in left pleural space appears to be low-density rather than blood attenuation. Stable scarring at the lung apices. Ground-glass densities along the medial right upper lobe may be related to atelectasis or adjacent air trapping. Compressive atelectasis in the left lower lobe associated with the left pleural effusion. 6 mm calcified granuloma in the left upper lobe. Small pleural-based densities in the upper lobes bilaterally probably postinflammatory. Musculoskeletal: No acute bone abnormality. Review of the MIP images confirms the above findings. CTA ABDOMEN AND PELVIS FINDINGS VASCULAR Aorta: Extensive atherosclerotic disease in the aorta with an infrarenal abdominal aortic aneurysm. Infrarenal abdominal aorta aneurysm measures 4 cm and stable. No evidence for an abdominal aortic dissection or intramural hematoma in the abdominal aorta. Celiac: Celiac trunk is patent with mild-to-moderate stenosis at the origin due to mixed plaque. Left gastric artery appears to originate directly from the aorta  just above the celiac trunk. SMA: Atherosclerotic disease at the origin of the SMA with at least mild stenosis. Renals: Right renal artery is patent but small. There appears to be significant disease and stenosis near the origin. The right kidney is atrophic. Left renal artery is patent with extensive calcified plaque and at least mild stenosis. IMA: IMA is patent. Inflow: Aneurysm terminates well above the aortic bifurcation. Right common, internal and external iliac arteries are patent. Iliac arteries are small. Right external iliac artery roughly measures 6 mm in greatest diameter. Left iliac arteries are patent with Stefannie Defeo patent stent involving the left common and left external iliac artery. Left internal iliac artery is patent. The left external iliac artery is small measuring roughly 5 mm. Common femoral arteries are patent bilaterally but small. Proximal femoral arteries are patent. Veins: No obvious venous abnormality within the limitations of this arterial phase study. Review of the MIP images confirms the above findings. NON-VASCULAR Hepatobiliary: Mild distention of the gallbladder without surrounding inflammatory changes. No acute abnormality to the liver. Pancreas: Unremarkable. No pancreatic ductal dilatation or surrounding inflammatory changes. Spleen: Normal in size without focal abnormality. Adrenals/Urinary Tract: Mild fullness in the right adrenal gland probably represents an adenoma or hyperplasia. Mild fullness of the left adrenal gland lateral limb is unchanged. Atrophic right kidney. Tiny calcification in left kidney lower pole is suggestive for small stone adjacent to Tavaughn Silguero cortical cyst. Minimal left perinephric stranding. Additional small stone in the left kidney upper pole region measuring roughly 4 mm. High-density  material in the left renal collecting system is most compatible with contrast. Urinary bladder is decompressed and contains Swathi Dauphin small amount of contrast. Stomach/Bowel: Extensive  diverticulosis in the sigmoid colon. Stomach is within normal limits. No evidence for bowel dilatation or obstruction. No evidence for focal bowel inflammation. Lymphatic: No significant lymph node enlargement in the abdomen or pelvis. Reproductive: There appears to be Madhav Mohon small uterus. No gross abnormality to the uterus or adnexal structures. Other: Again noted is Ayiana Winslett large left femoral hernia containing fat. Negative for free fluid. No free air. Musculoskeletal: Again noted is Solace Manwarren severe joint space narrowing and degenerative disease in the left hip joint. Remodeling of the left hip joint with extensive subchondral cyst formation. Findings are likely related to Fujie Dickison posttraumatic changes with secondary severe degenerative disease. Stable compression deformity involving the T12 vertebral body. Review of the MIP images confirms the above findings. IMPRESSION: 1. Interval change in the descending thoracic aortic disease. New and enlarging penetrating ulcerations suggesting evolution from an intramural hematoma into an aortic dissection. The size of the descending thoracic aorta has not significantly changed although somewhat difficult to measure due to the new left pleural fluid. 2. Development of Jimmey Hengel small to moderate sized left pleural effusion. The pleural fluid is very peculiar in that there appears to be significant increase in left pleural fluid DURING the CTA examination. Although the pleural fluid is low-density, this fluid may be related to the aortic disease. 3. No change in the abdominal aortic aneurysm measuring 4 cm. 4. Diffuse atherosclerotic disease throughout the chest, abdomen and pelvis. Again noted is Baleria Wyman stenosis involving the left subclavian artery and left common carotid artery. Stenosis involving the visceral arteries as described. 5. Small but patent left iliac arteries with Rashel Okeefe patent left iliac artery stent. 6. Nonobstructive left renal calculi. 7. Large left femoral hernia containing fat. These results  were called by telephone at the time of interpretation on 05/24/2018 at 7:43 am to Dr. Wyonia Hough, who verbally acknowledged these results. Electronically Signed   By: Richarda Overlie M.D.   On: 05/24/2018 07:52   Ct Angio Chest/abd/pel For Dissection W And/or Wo Contrast  Result Date: 05/17/2018 CLINICAL DATA:  Right-sided back pain radiating around to the right upper abdomen. History of aortic aneurysm. EXAM: CT ANGIOGRAPHY CHEST, ABDOMEN AND PELVIS TECHNIQUE: Multidetector CT imaging through the chest, abdomen and pelvis was performed using the standard protocol during bolus administration of intravenous contrast. Multiplanar reconstructed images and MIPs were obtained and reviewed to evaluate the vascular anatomy. CONTRAST:  75mL OMNIPAQUE IOHEXOL 350 MG/ML SOLN COMPARISON:  CT abdomen pelvis dated Jan 01, 2011. FINDINGS: CTA CHEST FINDINGS Cardiovascular: There is crescentic high density along the left aspect of the distal descending thoracic aorta adjacent to Yitzel Shasteen 12 mm penetrating ulcer. Additional crescentic high density along the left aspect of the proximal descending aorta with smaller 5 mm penetrating ulcer along the medial proximal descending aorta just beyond the arch (series 8, image 77). No definite S section. 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 Coronary, aortic arch, and branch vessel atherosclerotic vascular disease. Mild stenosis of the proximal left common carotid artery. 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 Intermediate density thickening along the central pulmonary arteries. Normal heart size. No pericardial effusion. No central pulmonary embolism. Mediastinum/Nodes: No enlarged mediastinal, hilar, or axillary lymph nodes. Thyroid gland, trachea, and esophagus demonstrate no significant findings. Lungs/Pleura: No focal consolidation, pleural effusion, or pneumothorax. Calcified granuloma in the  left upper lobe. No suspicious pulmonary nodule.  Musculoskeletal: No chest wall abnormality. No acute or significant osseous findings. Review of the MIP images confirms the above findings. CTA ABDOMEN AND PELVIS FINDINGS VASCULAR Aorta: Interval enlargement of the infrarenal abdominal aortic aneurysm, now measuring 4.0 cm, previously 2.8 cm. Extensive atherosclerotic calcification. No dissection or significant stenosis. Celiac: Mild stenosis of the origin due to calcific atherosclerosis. No aneurysm, dissection, or vasculitis. SMA: Moderate stenosis of the origin due to calcific atherosclerosis. No aneurysm, dissection, or vasculitis. Renals: Severe stenosis of the right renal artery origin with diminutive right renal artery. Moderate stenosis of the left renal artery origin due to calcific atherosclerosis. No aneurysm, dissection, or vasculitis. IMA: Patent without evidence of aneurysm, dissection, vasculitis or significant stenosis. Inflow: Left common and external iliac stent with severe focal stenosis at the distal stent. No aneurysm, dissection, or vasculitis. Veins: No obvious venous abnormality within the limitations of this arterial phase study. Review of the MIP images confirms the above findings. NON-VASCULAR Hepatobiliary: No focal liver abnormality is seen. No gallstones, gallbladder wall thickening, or biliary dilatation. Pancreas: Unremarkable. No pancreatic ductal dilatation or surrounding inflammatory changes. Spleen: Normal in size without focal abnormality. Adrenals/Urinary Tract: The adrenal glands are unremarkable. Progressive now severe atrophy of the right kidney. Small nonobstructive left renal calculi. Small cyst in the left lower pole. No hydronephrosis. The bladder is decompressed. Stomach/Bowel: Stomach is within normal limits. Appendix is surgically absent. No evidence of bowel wall thickening, distention, or inflammatory changes. Moderate sigmoid diverticulosis. Lymphatic: No enlarged abdominal or pelvic lymph nodes. Reproductive:  Uterus and bilateral adnexa are unremarkable. Other: Unchanged moderate fat containing left inguinal hernia. Probable small focal area of calcified fat necrosis in the inferior aspect of the hernia is unchanged. No free fluid or pneumoperitoneum. Musculoskeletal: No acute or significant osseous findings. Atrophy of the left gluteus muscles. Unchanged severe left hip osteoarthritis with deformity of the femoral head and acetabulum. Review of the MIP images confirms the above findings. IMPRESSION: Vascular: 1. 5 mm proximal descending thoracic aorta and 12 mm distal descending thoracic aorta penetrating ulcers with adjacent intramural hematoma. No dissection. 2. Intermediate density thickening of the central pulmonary arteries could reflect Rasheka Denard small amount of mediastinal hematoma versus possibly wall thickening related to underlying vasculitis. 3. 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 4. Interval enlargement of the infrarenal abdominal aortic aneurysm, now measuring 4.0 cm. Recommend followup by ultrasound in 1 year. This recommendation follows ACR consensus guidelines: White Paper of the ACR Incidental Findings Committee II on Vascular Findings. J Am Coll Radiol 2013; 10:789-794. 5. 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 Mild stenosis of the proximal left common carotid artery. 6. Mild stenosis of the celiac artery origin. Moderate stenosis of the SMA and left renal artery origins. 7. 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 8. Left common and external iliac stent with severe focal stenosis of the external iliac artery just distal to the stent. Other: 1. Nonobstructive left nephrolithiasis. 2. Moderate fat containing left inguinal hernia. These results were called by telephone at the time of interpretation on 05/17/2018 at 1:06 pm to Dr. Phineas Semen , who verbally acknowledged these results. Electronically  Signed   By: Obie Dredge M.D.   On: 05/17/2018 13:09    Microbiology: Recent Results (from the past 240 hour(s))  Surgical pcr screen     Status: None   Collection Time:  05/20/18  6:10 PM  Result Value Ref Range Status   MRSA, PCR NEGATIVE NEGATIVE Final   Staphylococcus aureus NEGATIVE NEGATIVE Final    Comment: (NOTE) The Xpert SA Assay (FDA approved for NASAL specimens in patients 57 years of age and older), is one component of Freddie Nghiem comprehensive surveillance program. It is not intended to diagnose infection nor to guide or monitor treatment. Performed at [facility], [address], [place], [place] [address]      Labs: Basic Metabolic Panel: Recent Labs  Lab 05/22/18 [phone] 05/25/18 0456 05/26/18 0432  NA 138 140 139  K 4.0 3.9 4.2  CL 102 104 105  CO2 28 30 29   GLUCOSE 101* 103* 100*  BUN 10 13 13   CREATININE 0.76 0.83 0.81  CALCIUM 8.8* 9.0 8.8*   Liver Function Tests: No results for input(s): AST, ALT, ALKPHOS, BILITOT, PROT, ALBUMIN in the last 168 hours. No results for input(s): LIPASE, AMYLASE in the last 168 hours. No results for input(s): AMMONIA in the last 168 hours. CBC: Recent Labs  Lab 05/24/18 0558 05/25/18 0456 05/26/18 0432 05/27/18 0428 05/28/18 0405  WBC 6.1 5.5 5.6 5.3 5.7  HGB 12.2 10.9* 10.9* 11.0* 10.6*  HCT 37.0 34.9* 35.2* 35.4* 33.2*  MCV 102.2* 103.9* 103.8* 104.4* 103.4*  PLT 240 263 298 334 345   Cardiac Enzymes: No results for input(s): CKTOTAL, CKMB, CKMBINDEX, TROPONINI in the last 168 hours. BNP: BNP (last 3 results) No results for input(s): BNP in the last 8760 hours.  ProBNP (last 3 results) No results for input(s): PROBNP in the last 8760 hours.  CBG: No results for input(s): GLUCAP in the last 168 hours.     Signed:  Lacretia Nicks MD.  [facility] 05/28/2018, 10:03 PM

## 2018-05-28 NOTE — Progress Notes (Signed)
Physical Therapy Treatment Patient Details Name: Elizabeth [name] MRN: [id number] DOB: 1941/06/08 Today's Date: 05/28/2018    History of Present Illness Pt is a 77 y.o. female admitted 05/17/18 with NSTEMI. Cath showed severe multivessel disease. CT angios showed 2 large areas of penetrating ulceration with what appears to be stable IMH extending into her distal thoracic proximal abdominal. 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    PT Comments    Pt progressing well with mobility. Ambulatory with RW at [place]. Plans to discharge home with son who will be able to provide assist if needed. Pt reports no further questions or concerns. Educ on energy conservation and importance of continued mobility. Will d/c acute PT.    Follow Up Recommendations  Home health PT;Supervision for mobility/OOB     Equipment Recommendations  Rolling walker with 5" wheels;3in1 (PT);Other (comment)(shower seat)    Recommendations for Other Services       Precautions / Restrictions Precautions Precautions: Fall Restrictions Weight Bearing Restrictions: No    Mobility  Bed Mobility Overal bed mobility: Independent                Transfers Overall transfer level: Modified independent Equipment used: None;Rolling walker (2 wheeled) Transfers: Sit to/from Stand              Ambulation/Gait Ambulation/Gait assistance: Supervision Gait Distance (Feet): 120 Feet Assistive device: Rolling walker (2 wheeled) Gait Pattern/deviations: Step-through pattern;Decreased stride length;Trunk flexed Gait velocity: Decreased Gait velocity interpretation: 1.31 - 2.62 ft/sec, indicative of limited community ambulator     Stairs Stairs: (Declined stair training; reports no stairs at son's home)           Wheelchair Mobility    Modified Rankin (Stroke Patients Only)       Balance Overall balance assessment: Mild  deficits observed, not formally tested                                          Cognition Arousal/Alertness: Awake/alert Behavior During Therapy: WFL for tasks assessed/performed Overall Cognitive Status: Within Functional Limits for tasks assessed                                        Exercises      General Comments        Pertinent Vitals/Pain Pain Assessment: No/denies pain    Home Living                      Prior Function            PT Goals (current goals can now be found in the care plan section) Progress towards PT goals: Goals met/education completed, patient discharged from PT    Frequency    Min 3X/week      PT Plan Current plan remains appropriate    Co-evaluation              AM-PAC PT "6 Clicks" Daily Activity  Outcome Measure  Difficulty turning over in bed (including adjusting bedclothes, sheets and blankets)?: None Difficulty moving from lying on back to sitting on the side of the bed? : None Difficulty sitting down on and standing up from a chair with arms (e.g., wheelchair, bedside commode, etc,.)?: None Help  needed moving to and from a bed to chair (including a wheelchair)?: None Help needed walking in hospital room?: A Little Help needed climbing 3-5 steps with a railing? : A Little 6 Click Score: 22    End of Session Equipment Utilized During Treatment: Gait belt Activity Tolerance: Patient tolerated treatment well Patient left: in bed;with call bell/phone within reach Nurse Communication: Mobility status PT Visit Diagnosis: Unsteadiness on feet (R26.81);Difficulty in walking, not elsewhere classified (R26.2)     Time: 6237-6283 PT Time Calculation (min) (ACUTE ONLY): 10 min  Charges:  $Gait Training: 8-22 mins                    Mabeline Caras, PT, DPT Acute Rehabilitation Services  Pager [phone] Office [phone]  Derry Lory 05/28/2018, 8:55 AM

## 2018-05-28 NOTE — Progress Notes (Addendum)
Progress Note  Patient Name: Elizabeth [name] Date of Encounter: 05/28/2018  Primary Cardiologist: Nanetta Batty, MD   Subjective   No recurrent chest pain, back pain or sob. She has chronic hip pain.   Inpatient Medications    Scheduled Meds: . amLODipine  10 mg Oral Daily  . aspirin EC  81 mg Oral Daily  . clopidogrel  75 mg Oral Daily  . docusate sodium  100 mg Oral Daily  . enoxaparin (LOVENOX) injection  40 mg Subcutaneous Q24H  . famotidine  20 mg Oral BID  . isosorbide mononitrate  15 mg Oral Daily  . mouth rinse  15 mL Mouth Rinse BID  . metoprolol tartrate  100 mg Oral BID  . oxyCODONE  10 mg Oral Q12H  . polyethylene glycol  17 g Oral Daily  . potassium citrate  10 mEq Oral BID WC  . rosuvastatin  20 mg Oral q1800  . sodium chloride flush  3 mL Intravenous Q12H   Continuous Infusions: . sodium chloride    . lactated ringers 182 mL/hr at 05/19/18 1500   PRN Meds: sodium chloride, acetaminophen, fentaNYL (SUBLIMAZE) injection, HYDROcodone-acetaminophen, metoprolol tartrate, ondansetron (ZOFRAN) IV, sodium chloride, sodium chloride flush   Vital Signs    Vitals:   05/27/18 2007 05/27/18 2009 05/28/18 0527 05/28/18 0841  BP:  (!) 117/57 (!) 131/59 (!) 149/68  Pulse:  81 72 (!) 104  Resp: 14 16    Temp: 99.5 F (37.5 C) 99.5 F (37.5 C) 98.4 F (36.9 C)   TempSrc: Oral Oral Oral   SpO2:  93% 94%   Weight:   62.7 kg   Height:        Intake/Output Summary (Last 24 hours) at 05/28/2018 0850 Last data filed at 05/27/2018 2130 Gross per 24 hour  Intake 720 ml  Output 650 ml  Net 70 ml   Filed Weights   05/26/18 0625 05/27/18 0500 05/28/18 0527  Weight: 61.3 kg 61.7 kg 62.7 kg    Telemetry    SR at rate of 110s. Brief episode of SVT - Personally Reviewed  ECG  N/A  Physical Exam   GEN: No acute distress.   Neck: No JVD Cardiac: RRR, no murmurs, rubs, or gallops.  Respiratory: Clear to auscultation bilaterally. GI: Soft, nontender,  non-distended  MS: No edema; No deformity. Neuro:  Nonfocal  Psych: Normal affect   Labs    Chemistry Recent Labs  Lab 05/22/18 0614 05/25/18 0456 05/26/18 0432  NA 138 140 139  K 4.0 3.9 4.2  CL 102 104 105  CO2 28 30 29   GLUCOSE 101* 103* 100*  BUN 10 13 13   CREATININE 0.76 0.83 0.81  CALCIUM 8.8* 9.0 8.8*  GFRNONAA >60 >60 >60  GFRAA >60 >60 >60  ANIONGAP 8 6 5      Hematology Recent Labs  Lab 05/26/18 0432 05/27/18 0428 05/28/18 0405  WBC 5.6 5.3 5.7  RBC 3.39* 3.39* 3.21*  HGB 10.9* 11.0* 10.6*  HCT 35.2* 35.4* 33.2*  MCV 103.8* 104.4* 103.4*  MCH 32.2 32.4 33.0  MCHC 31.0 31.1 31.9  RDW 12.1 11.9 11.9  PLT 298 334 345    Radiology    Ct Angio Chest/abd/pel For Dissection W And/or W/wo  Result Date: 05/27/2018 CLINICAL DATA:  77 year old female with 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 EXAM: CT ANGIOGRAPHY CHEST, ABDOMEN AND PELVIS TECHNIQUE: Multidetector CT imaging through the chest, abdomen and pelvis was performed using the standard protocol during  bolus administration of intravenous contrast. Multiplanar reconstructed images and MIPs were obtained and reviewed to evaluate the vascular anatomy. CONTRAST:  ISOVUE-370 IOPAMIDOL (ISOVUE-370) INJECTION 76% COMPARISON:  Prior CT arteriogram 05/17/2018 and 05/23/2018 FINDINGS: CTA CHEST FINDINGS Cardiovascular: 4 vessel aortic arch. The left vertebral artery arises directly from the aorta. There is a high-grade stenosis of the proximal left subclavian artery secondary to primarily fibrofatty atherosclerotic plaque. Elongation of the aortic infundibulum and proximal descending thoracic aorta resulting in a type 3 arch. The aortic root, sino-tubular junction and ascending thoracic aorta are within normal limits. Redemonstration of a multifocal penetrating aortic ulcers with associated stable intramural hematoma and mild aneurysmal dilatation of the descending thoracic  aorta. The most proximal descending thoracic aorta measures a maximum of 3.4 cm. The mid descending thoracic aorta measures a maximal of 3.7 cm. The distal descending thoracic aorta measures up to 4.2 cm. Overall, the findings are stable compared to 05/23/2018. Heterogeneous mixed atherosclerotic plaque throughout the aorta. Normal caliber pulmonary arteries. No evidence of pulmonary embolus. The heart is normal in size. No pericardial effusion. Calcifications are present along the coronary arteries. Mediastinum/Nodes: Unremarkable CT appearance of the thyroid gland. No suspicious mediastinal or hilar adenopathy. No soft tissue mediastinal mass. The thoracic esophagus is unremarkable. Lungs/Pleura: Small left-sided pleural effusion with associated left lower lobe atelectasis. Calcified granuloma in the left upper lobe. Mild biapical pleuroparenchymal scarring. Second small calcified granuloma in the left upper lobe. Several subpleural pulmonary lymph nodes remain unchanged. No suspicious pulmonary mass or nodule. Musculoskeletal: No acute fracture or aggressive appearing lytic or blastic osseous lesion. Review of the MIP images confirms the above findings. CTA ABDOMEN AND PELVIS FINDINGS VASCULAR Aorta: Heterogeneous and irregular atherosclerotic plaque extends throughout the abdominal aorta. There is fusiform aneurysmal dilatation of the infrarenal abdominal aorta with a maximal transverse diameter of 4.2 cm. Celiac: Heterogeneous atherosclerotic plaque results in focal moderate to high-grade stenosis of the origin of the celiac artery. No evidence of dissection or aneurysm. SMA: Predominantly calcified atherosclerotic plaque results in focal moderate stenosis of the proximal superior mesenteric artery. No dissection or aneurysm. Renals: Solitary bilateral renal arteries. Critical stenosis at the origin of the right renal artery which is likely chronic in nature given the overall right renal atrophy. Calcified  atherosclerotic plaque results in moderate stenosis of the origin of the left renal artery. IMA: Patent.  Normal in caliber. Inflow: Heavily calcified atherosclerotic plaque at the aortic bifurcation. There may be mild stenosis of the origin of the right common iliac artery. An approximately 8 x 60 mm metal stent is present on the left extending from the common into the external iliac artery. The proximal landing zone is deflected medially and partially penetrates through the common iliac wall. This has allowed for buildup of thrombus versus plaque between the lateral margin of the stent and the calcified lateral margin of the arterial wall. Because the stent is partially within the arterial lumen and partially in the subintimal plane, there is a resultant relatively high-grade stenosis were the lumen of the artery and the stent communicate (please see coronal reformatted images). Both internal iliac arteries remain patent. The left external iliac artery is relatively small in caliber measuring only approximately 4 mm. On the right, the external iliac artery measures up to 5 mm. The left common femoral artery is also small. Veins: No focal venous abnormality. Review of the MIP images confirms the above findings. NON-VASCULAR Hepatobiliary: Normal hepatic contour and morphology. No discrete hepatic lesions. Normal  appearance of the gallbladder. No intra or extrahepatic biliary ductal dilatation. Pancreas: Unremarkable. No pancreatic ductal dilatation or surrounding inflammatory changes. Spleen: Normal in size without focal abnormality. Adrenals/Urinary Tract: Normal adrenal glands. Atrophic right kidney likely secondary to chronic ischemia. 3-4 mm nonobstructing left upper pole nephrolithiasis. 1.4 cm simple cyst arising from the lower pole of the left kidney. There are a few areas of focal renal cortical thinning consistent with scarring no enhancing renal mass. Unremarkable ureters and bladder. Stomach/Bowel:  Colonic diverticular disease without CT evidence of active inflammation. No focal bowel wall thickening or evidence of obstruction. Lymphatic: No suspicious lymphadenopathy. Reproductive: Uterus and bilateral adnexa are unremarkable. Other: Small fat containing umbilical hernia. Omental fat containing left femoral hernia. Additionally, there is a dystrophic calcification in the inferior aspect of the hernia likely representing a region of prior omental infarction. Musculoskeletal: No acute fracture or aggressive appearing lytic or blastic osseous lesion. Severe left hip joint degenerative osteoarthritis. Focal L2-L3 degenerative disc disease. Remote compression fracture involving the inferior endplate of T12. Approximately 40% height loss anteriorly. Review of the MIP images confirms the above findings. IMPRESSION: CTA CHEST 1. 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 The affected segment of the aorta remains mildly aneurysmal with a maximal diameter of 4.2 cm. 2. 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 3. Coronary artery calcifications. 4. Old granulomatous disease in the left upper lobe. CTA ABD/PELVIS 1. Fusiform aneurysmal dilatation of the infrarenal abdominal aorta with a maximal diameter of 4.2 cm. 2. Partially malpositioned left iliac stent. The proximal landing zone has partially eroded into the subintimal space medially resulting in a fairly high-grade stenosis at the interface between the arterial lumen and the stent (please see coronal reformatted images). The stent remains patent, however the left external iliac and common femoral arteries are relatively small in caliber (4 mm) likely due in part to diminished flow. 3. 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 4. 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 5. Moderate stenosis of the origin of the superior mesenteric artery. 6. Mild stenosis of the origin of the right common iliac artery. 7. Moderate omental fat containing left femoral hernia. 8. Extensive colonic diverticulosis without evidence of active diverticulitis. 9. Severe left hip joint degenerative osteoarthritis. 10. Remote T12 compression fracture with approximately 40% height loss. 11. Multifocal left renal cortical scarring. Signed, Sterling Big, MD, RPVI [facility] Radiology Electronically Signed   By: Malachy Moan M.D.   On: 05/27/2018 10:02    Cardiac Studies   Catheterization 05/19/18:  Total occlusion of the mid to distal RCA. Distal RCA fills via well-formed collaterals around the left ventricular apex from the LAD.  Total occlusion of a relatively small circumflex. Minimal collaterals are noted.  Ostial 60% left main with pressure damping noted during engagement. At least 60% distal left main is noted.  Heavily calcified 80% proximal LAD followed by eccentric calcified slitlike 70% mid LAD stenosis.  Normal LV function with EF 55%.  RECOMMENDATIONS:  The patient has a single remaining conduit which is the left main coronary artery supplying the LAD. Both the circumflex and right coronary are occluded. The distal left main, proximal LAD is aneurysmal and heavily calcified. The LAD supplies well-formed collaterals to the PDA.  Recommend evaluation for potential surgical revascularization.  No  appealing interventional options exist.   Echocardiogram 05/20/18:  Study Conclusions - Left ventricle: The cavity size was normal. Wall thickness was normal. Systolic function was normal. The estimated ejection fraction was in the range of60% to 65%. Hypokinesisof the mid-apicalinferolateral myocardium; consistent with ischemia in the distribution of the  right coronary or left circumflex coronary artery. Doppler parameters are consistent with abnormal left ventricular relaxation (grade 1 diastolic dysfunction). - Mitral valve: There was mild regurgitation.  Patient Profile    77 y.o.femalewith a hx of peripheral vascular disease status post left common iliac artery stenting followed by [place] vascular vein, bilateral carotid endarterectomy, hypertension and abdominal aortic aneurysmwho is being seen for NSTEMI. Cath showed severe multi-vessel disease.   Assessment & Plan    1. Non-STEMI - Occurred on 05/17/2018 in the setting of acute ulceration of thoracic aorta. Troponin peaked to 7. Cath on 05/19/2018 showed severe multi-vessel disease. Poor targets for PCI intervention. Seen by Dr. Morton Peters and felt medical management was the best option.  Echo showed LVEF of 60-65% with hypokinesis of mid-apicalinferolateral myocardium, and grade 1 diastolic dysfunction. If she were to develop angina, could consider high risk PCI. - Continue ASA, Rosuvastatin 20mg  daily, beta blocker, and Imdur.  2. Acute Ulceration with Intramural Hematoma of the Thoracic Aorta - Repeat IMG by repeat scan yesterday. Plan to follow up as outpatient.    3. 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 PAD - S/p left common iliac artery stenting.  5. Hypertension - BP stable on current medications.  6. AAA  - Slightly larger than previously, now at 4cm.  7. SVT/Sinus tachycardia - Hr in 100-110s. Brief episode of SVT. Increase metoprolol to 100mg  BID.   Plan to discharge home later today  [facility] will sign off.   Medication Recommendations/Other recommendations (labs, testing, etc):  As above Follow up as an outpatient:  With APP   For questions or updates, please contact [facility] Please consult [email or web address] for contact info under       SignedManson Passey, PA  05/28/2018, 8:50 AM    Patient seen, examined. Available data reviewed. Agree with findings, assessment, and plan as outlined by Chelsea Aus, PA.  On exam the patient is very pleasant in no distress, JVP normal, heart regular rate and rhythm with grade 2/6 early peaking systolic murmur at the right upper sternal border, abdomen soft and nontender, extremities without edema.  The patient remains clinically stable.  I agree with increasing her beta-blocker.  Her medical program is reviewed as above and I agree.  The patient is on DAPT With ASA and clopidogrel for medical treatment of NSTEMI. No other specific recommendations - will sign off today. Please call if questions.  Tonny Bollman, M.D. 05/28/2018 11:04 AM

## 2018-05-28 NOTE — Progress Notes (Signed)
   Okay for discharge from vascular standpoint.  I discussed with her signs and symptoms of worsening thoracic aortic pathology.  If she continues to do well I will see her in 3 to 4 weeks with repeat CT angios in my office or we can discuss surgical necessity.  Aashvi Rezabek C. Randie Heinz, MD [facility] [place] Office: [phone] Pager: [phone]

## 2018-05-29 DIAGNOSIS — Z87891 Personal history of nicotine dependence: Secondary | ICD-10-CM | POA: Diagnosis not present

## 2018-05-29 DIAGNOSIS — I739 Peripheral vascular disease, unspecified: Secondary | ICD-10-CM | POA: Diagnosis not present

## 2018-05-29 DIAGNOSIS — Z7902 Long term (current) use of antithrombotics/antiplatelets: Secondary | ICD-10-CM | POA: Diagnosis not present

## 2018-05-29 DIAGNOSIS — Z9181 History of falling: Secondary | ICD-10-CM | POA: Diagnosis not present

## 2018-05-29 DIAGNOSIS — I712 Thoracic aortic aneurysm, without rupture: Secondary | ICD-10-CM | POA: Diagnosis not present

## 2018-05-29 DIAGNOSIS — I10 Essential (primary) hypertension: Secondary | ICD-10-CM | POA: Diagnosis not present

## 2018-05-29 DIAGNOSIS — I714 Abdominal aortic aneurysm, without rupture: Secondary | ICD-10-CM | POA: Diagnosis not present

## 2018-05-29 DIAGNOSIS — I214 Non-ST elevation (NSTEMI) myocardial infarction: Secondary | ICD-10-CM | POA: Diagnosis not present

## 2018-05-29 DIAGNOSIS — I471 Supraventricular tachycardia: Secondary | ICD-10-CM | POA: Diagnosis not present

## 2018-05-29 DIAGNOSIS — Z7982 Long term (current) use of aspirin: Secondary | ICD-10-CM | POA: Diagnosis not present

## 2018-05-29 SURGERY — CORONARY ARTERY BYPASS GRAFTING (CABG)
Anesthesia: General | Site: Chest

## 2018-05-30 ENCOUNTER — Other Ambulatory Visit: Payer: Self-pay

## 2018-05-30 ENCOUNTER — Telehealth (INDEPENDENT_AMBULATORY_CARE_PROVIDER_SITE_OTHER): Payer: Self-pay

## 2018-05-30 DIAGNOSIS — I714 Abdominal aortic aneurysm, without rupture, unspecified: Secondary | ICD-10-CM

## 2018-05-30 DIAGNOSIS — I71019 Dissection of thoracic aorta, unspecified: Secondary | ICD-10-CM

## 2018-05-30 DIAGNOSIS — I7101 Dissection of thoracic aorta: Secondary | ICD-10-CM

## 2018-05-30 NOTE — Telephone Encounter (Signed)
Nurse Amy called and stated that they admitted this patient yesterday for services and that she needs a order for a "Plan of Care" to be to see the patient: Once a week for 5 weeks for nursing services?  I will call her and give her a verbal if you are okay with this?

## 2018-05-30 NOTE — Telephone Encounter (Signed)
Called Amy back and left a message for her with the okay to move forward with her "Plan of Care"

## 2018-06-02 DIAGNOSIS — R05 Cough: Secondary | ICD-10-CM | POA: Diagnosis not present

## 2018-06-02 DIAGNOSIS — J3481 Nasal mucositis (ulcerative): Secondary | ICD-10-CM | POA: Diagnosis not present

## 2018-06-02 DIAGNOSIS — M7062 Trochanteric bursitis, left hip: Secondary | ICD-10-CM | POA: Diagnosis not present

## 2018-06-02 DIAGNOSIS — G894 Chronic pain syndrome: Secondary | ICD-10-CM | POA: Diagnosis not present

## 2018-06-03 ENCOUNTER — Telehealth: Payer: Self-pay | Admitting: Vascular Surgery

## 2018-06-03 ENCOUNTER — Telehealth: Payer: Self-pay | Admitting: Cardiology

## 2018-06-03 DIAGNOSIS — Z9181 History of falling: Secondary | ICD-10-CM | POA: Diagnosis not present

## 2018-06-03 DIAGNOSIS — Z7902 Long term (current) use of antithrombotics/antiplatelets: Secondary | ICD-10-CM | POA: Diagnosis not present

## 2018-06-03 DIAGNOSIS — I714 Abdominal aortic aneurysm, without rupture: Secondary | ICD-10-CM | POA: Diagnosis not present

## 2018-06-03 DIAGNOSIS — I214 Non-ST elevation (NSTEMI) myocardial infarction: Secondary | ICD-10-CM | POA: Diagnosis not present

## 2018-06-03 DIAGNOSIS — I10 Essential (primary) hypertension: Secondary | ICD-10-CM | POA: Diagnosis not present

## 2018-06-03 DIAGNOSIS — Z7982 Long term (current) use of aspirin: Secondary | ICD-10-CM | POA: Diagnosis not present

## 2018-06-03 DIAGNOSIS — I739 Peripheral vascular disease, unspecified: Secondary | ICD-10-CM | POA: Diagnosis not present

## 2018-06-03 DIAGNOSIS — I712 Thoracic aortic aneurysm, without rupture: Secondary | ICD-10-CM | POA: Diagnosis not present

## 2018-06-03 DIAGNOSIS — I471 Supraventricular tachycardia: Secondary | ICD-10-CM | POA: Diagnosis not present

## 2018-06-03 DIAGNOSIS — Z87891 Personal history of nicotine dependence: Secondary | ICD-10-CM | POA: Diagnosis not present

## 2018-06-03 NOTE — Telephone Encounter (Signed)
SPOKE TO AMY- SHE STATES  SHE RECEIVE AN ORDER FROM PATIENT'S PRIMARY TO COVER VISITS.

## 2018-06-03 NOTE — Telephone Encounter (Signed)
-----   Message from Maeola Harman, MD sent at 05/27/2018  4:24 PM EDT ----- Elizabeth [name] [id number] 1940-12-01  Inpatient level on consult Diagnosis: Thoracic IMH  Follow-up with me in 3 to 4 weeks with CT angio 3 chest abdomen and pelvis dissection protocol.

## 2018-06-03 NOTE — Telephone Encounter (Signed)
° ° °  Amy from [facility] calling to request orders for home care nurse 1x for 5 weeks. Fax [phone] Phone [phone]

## 2018-06-03 NOTE — Telephone Encounter (Signed)
sch appt lvm mld ltr 06/25/18 11am CTA chest/abd/pel 06/27/18 10am f/u CTA MD

## 2018-06-04 DIAGNOSIS — I214 Non-ST elevation (NSTEMI) myocardial infarction: Secondary | ICD-10-CM | POA: Diagnosis not present

## 2018-06-04 DIAGNOSIS — I739 Peripheral vascular disease, unspecified: Secondary | ICD-10-CM | POA: Diagnosis not present

## 2018-06-04 DIAGNOSIS — Z9181 History of falling: Secondary | ICD-10-CM | POA: Diagnosis not present

## 2018-06-04 DIAGNOSIS — Z7902 Long term (current) use of antithrombotics/antiplatelets: Secondary | ICD-10-CM | POA: Diagnosis not present

## 2018-06-04 DIAGNOSIS — I471 Supraventricular tachycardia: Secondary | ICD-10-CM | POA: Diagnosis not present

## 2018-06-04 DIAGNOSIS — I712 Thoracic aortic aneurysm, without rupture: Secondary | ICD-10-CM | POA: Diagnosis not present

## 2018-06-04 DIAGNOSIS — I714 Abdominal aortic aneurysm, without rupture: Secondary | ICD-10-CM | POA: Diagnosis not present

## 2018-06-04 DIAGNOSIS — I10 Essential (primary) hypertension: Secondary | ICD-10-CM | POA: Diagnosis not present

## 2018-06-04 DIAGNOSIS — Z87891 Personal history of nicotine dependence: Secondary | ICD-10-CM | POA: Diagnosis not present

## 2018-06-04 DIAGNOSIS — Z7982 Long term (current) use of aspirin: Secondary | ICD-10-CM | POA: Diagnosis not present

## 2018-06-05 ENCOUNTER — Ambulatory Visit: Payer: Medicare Other | Admitting: Cardiology

## 2018-06-05 ENCOUNTER — Encounter: Payer: Self-pay | Admitting: Cardiology

## 2018-06-05 VITALS — BP 138/76 | HR 90 | Ht 61.0 in | Wt 130.0 lb

## 2018-06-05 DIAGNOSIS — I10 Essential (primary) hypertension: Secondary | ICD-10-CM

## 2018-06-05 DIAGNOSIS — I214 Non-ST elevation (NSTEMI) myocardial infarction: Secondary | ICD-10-CM | POA: Diagnosis not present

## 2018-06-05 DIAGNOSIS — I700 Atherosclerosis of aorta: Secondary | ICD-10-CM | POA: Diagnosis not present

## 2018-06-05 DIAGNOSIS — I2510 Atherosclerotic heart disease of native coronary artery without angina pectoris: Secondary | ICD-10-CM

## 2018-06-05 DIAGNOSIS — I719 Aortic aneurysm of unspecified site, without rupture: Secondary | ICD-10-CM

## 2018-06-05 DIAGNOSIS — I739 Peripheral vascular disease, unspecified: Secondary | ICD-10-CM | POA: Diagnosis not present

## 2018-06-05 MED ORDER — NITROGLYCERIN 0.4 MG SL SUBL: 0.4000 mg | SUBLINGUAL_TABLET | SUBLINGUAL | 3 refills | Status: DC | PRN

## 2018-06-05 NOTE — Patient Instructions (Signed)
Medication Instructions:  START Nitrostat take 1 tablet as needed for emergency chest pain. If you need a refill on your cardiac medications before your next appointment, please call your pharmacy.   Lab work: None  If you have labs (blood work) drawn today and your tests are completely normal, you will receive your results only by: [name] MyChart Message (if you have MyChart) OR . A paper copy in the mail If you have any lab test that is abnormal or we need to change your treatment, we will call you to review the results.  Testing/Procedures: None   Follow-Up: At [facility], you and your health needs are our priority.  As part of our continuing mission to provide you with exceptional heart care, we have created designated Provider Care Teams.  These Care Teams include your primary Cardiologist (physician) and Advanced Practice Providers (APPs -  Physician Assistants and Nurse Practitioners) who all work together to provide you with the care you need, when you need it. . Your physician recommends that you schedule a follow-up appointment in: 3 months with Dr Allyson Sabal.  Any Other Special Instructions Will Be Listed Below (If Applicable).

## 2018-06-06 DIAGNOSIS — I471 Supraventricular tachycardia: Secondary | ICD-10-CM | POA: Diagnosis not present

## 2018-06-06 DIAGNOSIS — I214 Non-ST elevation (NSTEMI) myocardial infarction: Secondary | ICD-10-CM | POA: Diagnosis not present

## 2018-06-06 DIAGNOSIS — I739 Peripheral vascular disease, unspecified: Secondary | ICD-10-CM | POA: Diagnosis not present

## 2018-06-06 DIAGNOSIS — Z87891 Personal history of nicotine dependence: Secondary | ICD-10-CM | POA: Diagnosis not present

## 2018-06-06 DIAGNOSIS — Z7982 Long term (current) use of aspirin: Secondary | ICD-10-CM | POA: Diagnosis not present

## 2018-06-06 DIAGNOSIS — I714 Abdominal aortic aneurysm, without rupture: Secondary | ICD-10-CM | POA: Diagnosis not present

## 2018-06-06 DIAGNOSIS — Z7902 Long term (current) use of antithrombotics/antiplatelets: Secondary | ICD-10-CM | POA: Diagnosis not present

## 2018-06-06 DIAGNOSIS — I712 Thoracic aortic aneurysm, without rupture: Secondary | ICD-10-CM | POA: Diagnosis not present

## 2018-06-06 DIAGNOSIS — I10 Essential (primary) hypertension: Secondary | ICD-10-CM | POA: Diagnosis not present

## 2018-06-06 DIAGNOSIS — Z9181 History of falling: Secondary | ICD-10-CM | POA: Diagnosis not present

## 2018-06-06 NOTE — Progress Notes (Signed)
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 She was seen in consult on admission by Dr Randie Heinz. The plan was for B/P control and then repeat CT. After admission she continued to have pain and her Troponin was noted to be elevated c/w NSTEM and peaked at 7.5.  Dr Allyson Sabal was asked to see in consult on 05/19/18. The pt had no prior history of CAD or MI. Cath was done 05/19/18 and revealed severe 4V CAD with 60% LM, 80% pKAD, 70% mLAD, occluded CFX at the ostium, and occluded RCA which filled via LAD collaterals. Her EF was 50-55%, 60-65% by echo. She was felt to have poor targets for PCI intervention. She was evaluated by Dr. Morton Peters and felt medical management was the best option.   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 Her f/u CT on 05/27/18 showed stable appearance of her aneurysm. She presents to the office today for follow up, her sister accompanied her.  Since discharge she done well, no further back pain or unusual dyspnea. Her main complaint is hip pain.      Current Outpatient Medications  Medication Sig Dispense Refill  . amLODipine (NORVASC) 10 MG tablet Take 10 mg by mouth daily.  12  . aspirin EC 81 MG EC tablet Take 1 tablet (81 mg total) by mouth daily. (Patient taking differently: Take 81 mg by mouth daily. Does not take if she has taken Goody HA powder)    . Aspirin-Acetaminophen-Caffeine (GOODY HEADACHE PO) Take 1 packet by mouth daily as  needed (headache).    . cholecalciferol (VITAMIN D) 1000 units tablet Take 1,000 Units by mouth daily.    . clopidogrel (PLAVIX) 75 MG tablet Take 75 mg by mouth daily.  12  . Cyanocobalamin (VITAMIN B-12 PO) Take 1 tablet by mouth daily.    [name] HYDROcodone-acetaminophen (NORCO) 10-325 MG tablet Take 1 tablet by mouth every 6 (six) hours as needed.    . isosorbide mononitrate (IMDUR) 30 MG 24 hr tablet Take 0.5 tablets (15 mg total) by mouth daily. 15 tablet 0  . metoprolol tartrate (LOPRESSOR) 50 MG tablet Take 2 tablets (100 mg total) by mouth 2 (two) times daily. 120 tablet 0  . Multiple Vitamin (MULTIVITAMIN) tablet Take 1 tablet by mouth daily.    . polyethylene glycol (MIRALAX / GLYCOLAX) packet Take 17 g by mouth daily. 14 each 0  . potassium citrate (UROCIT-K) 10 MEQ (1080 MG) SR tablet Take 10 mEq by mouth 2 (two) times daily.  12  . rosuvastatin (CRESTOR) 20 MG tablet Take 20 mg by mouth daily.  12  . nitroGLYCERIN (NITROSTAT) 0.4 MG SL tablet Place 1 tablet (0.4 mg total) under the tongue every 5 (five) minutes as needed for chest pain. 25 tablet 3   No current facility-administered medications for this visit.     Allergies  Allergen Reactions  . Codeine Other (See Comments)    Reaction: Unsure  . Sulfa Antibiotics  Other (See Comments)    Mouth blisters    Past Medical History:  Diagnosis Date  . AAA (abdominal aortic aneurysm) (HCC)   . Hypertension   . Peripheral vascular disease (HCC)     Social History   Socioeconomic History  . Marital status: Widowed    Spouse name: Not on file  . Number of children: Not on file  . Years of education: Not on file  . Highest education level: Not on file  Occupational History  . Not on file  Social Needs  . Financial resource strain: Not on file  . Food insecurity:    Worry: Not on file    Inability: Not on file  . Transportation needs:    Medical: Not on file    Non-medical: Not on file  Tobacco Use  . Smoking status:  Former Smoker    Last attempt to quit: 02/02/1999    Years since quitting: 19.3  . Smokeless tobacco: Never Used  Substance and Sexual Activity  . Alcohol use: No  . Drug use: No  . Sexual activity: Not Currently  Lifestyle  . Physical activity:    Days per week: Not on file    Minutes per session: Not on file  . Stress: Not on file  Relationships  . Social connections:    Talks on phone: Not on file    Gets together: Not on file    Attends religious service: Not on file    Active member of club or organization: Not on file    Attends meetings of clubs or organizations: Not on file    Relationship status: Not on file  . Intimate partner violence:    Fear of current or ex partner: Not on file    Emotionally abused: Not on file    Physically abused: Not on file    Forced sexual activity: Not on file  Other Topics Concern  . Not on file  Social History Narrative  . Not on file     Family History  Problem Relation Age of Onset  . Hypertension Mother   . Stroke Mother   . Hypertension Father   . Stroke Father      Review of Systems: General: negative for chills, fever, night sweats or weight changes.  Cardiovascular: negative for chest pain, dyspnea on exertion, edema, orthopnea, palpitations, paroxysmal nocturnal dyspnea or shortness of breath Dermatological: negative for rash Respiratory: negative for cough or wheezing Urologic: negative for hematuria Abdominal: negative for nausea, vomiting, diarrhea, bright red blood per rectum, melena, or hematemesis Neurologic: negative for visual changes, syncope, or dizziness Lt hip pain All other systems reviewed and are otherwise negative except as noted above.    Blood pressure 138/76, pulse 90, height 5\' 1"  (1.549 m), weight 130 lb (59 kg), SpO2 96 %.  General appearance: alert, cooperative, appears older than stated age and no distress Neck: no JVD and bilateral CEA scars and bruits Lungs: clear to auscultation  bilaterally Heart: regular rate and rhythm Extremities: no edema Skin: cool pale dry Neurologic: Grossly normal  EKG NSR, 90, NSST chnages  ASSESSMENT AND PLAN:   1. Non-STEMI - Occurred on 05/17/2018 in the setting of acute ulceration of thoracic aorta. Troponin peaked to 7. Cath on 05/19/2018 showed severe multi-vessel disease. Poor targets for PCI intervention. Seen by Dr. Morton Peters and felt medical management was the best option.  Echo showed LVEF of 60-65% with hypokinesis of mid-apicalinferolateral myocardium, and grade 1 diastolic dysfunction. If she  were to develop angina, we could consider high risk PCI.  2. 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   3. 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 PAD - S/p left common iliac artery stenting.  5. Hypertension - BP stable on current medications.  6. AAA  - Slightly larger than previously, now at 4cm.  7. SVT/Sinus tachycardia - Hr in 100-110s. Brief episode of SVT. Increase metoprolol to 100mg  BID.   8. Lt hip DJD   PLAN  Same Rx- she is on Metoprolol 100 mg BID. No angina on her current medications. She has a follow up CT scheduled for 11/20 and a f/u with Dr Henry Russel 11/22 and Dr Allyson Sabal in December. I provided her with an Rx for SL NTG.  Corine Shelter PA-C 06/06/2018 7:44 AM

## 2018-06-09 DIAGNOSIS — Z7902 Long term (current) use of antithrombotics/antiplatelets: Secondary | ICD-10-CM | POA: Diagnosis not present

## 2018-06-09 DIAGNOSIS — I712 Thoracic aortic aneurysm, without rupture: Secondary | ICD-10-CM | POA: Diagnosis not present

## 2018-06-09 DIAGNOSIS — Z9181 History of falling: Secondary | ICD-10-CM | POA: Diagnosis not present

## 2018-06-09 DIAGNOSIS — I471 Supraventricular tachycardia: Secondary | ICD-10-CM | POA: Diagnosis not present

## 2018-06-09 DIAGNOSIS — I714 Abdominal aortic aneurysm, without rupture: Secondary | ICD-10-CM | POA: Diagnosis not present

## 2018-06-09 DIAGNOSIS — I10 Essential (primary) hypertension: Secondary | ICD-10-CM | POA: Diagnosis not present

## 2018-06-09 DIAGNOSIS — I214 Non-ST elevation (NSTEMI) myocardial infarction: Secondary | ICD-10-CM | POA: Diagnosis not present

## 2018-06-09 DIAGNOSIS — Z87891 Personal history of nicotine dependence: Secondary | ICD-10-CM | POA: Diagnosis not present

## 2018-06-09 DIAGNOSIS — Z7982 Long term (current) use of aspirin: Secondary | ICD-10-CM | POA: Diagnosis not present

## 2018-06-09 DIAGNOSIS — I739 Peripheral vascular disease, unspecified: Secondary | ICD-10-CM | POA: Diagnosis not present

## 2018-06-10 DIAGNOSIS — Z9181 History of falling: Secondary | ICD-10-CM | POA: Diagnosis not present

## 2018-06-10 DIAGNOSIS — Z7982 Long term (current) use of aspirin: Secondary | ICD-10-CM | POA: Diagnosis not present

## 2018-06-10 DIAGNOSIS — I471 Supraventricular tachycardia: Secondary | ICD-10-CM | POA: Diagnosis not present

## 2018-06-10 DIAGNOSIS — I714 Abdominal aortic aneurysm, without rupture: Secondary | ICD-10-CM | POA: Diagnosis not present

## 2018-06-10 DIAGNOSIS — I214 Non-ST elevation (NSTEMI) myocardial infarction: Secondary | ICD-10-CM | POA: Diagnosis not present

## 2018-06-10 DIAGNOSIS — I712 Thoracic aortic aneurysm, without rupture: Secondary | ICD-10-CM | POA: Diagnosis not present

## 2018-06-10 DIAGNOSIS — I739 Peripheral vascular disease, unspecified: Secondary | ICD-10-CM | POA: Diagnosis not present

## 2018-06-10 DIAGNOSIS — Z7902 Long term (current) use of antithrombotics/antiplatelets: Secondary | ICD-10-CM | POA: Diagnosis not present

## 2018-06-10 DIAGNOSIS — I10 Essential (primary) hypertension: Secondary | ICD-10-CM | POA: Diagnosis not present

## 2018-06-10 DIAGNOSIS — Z87891 Personal history of nicotine dependence: Secondary | ICD-10-CM | POA: Diagnosis not present

## 2018-06-13 DIAGNOSIS — I739 Peripheral vascular disease, unspecified: Secondary | ICD-10-CM | POA: Diagnosis not present

## 2018-06-13 DIAGNOSIS — Z87891 Personal history of nicotine dependence: Secondary | ICD-10-CM | POA: Diagnosis not present

## 2018-06-13 DIAGNOSIS — I712 Thoracic aortic aneurysm, without rupture: Secondary | ICD-10-CM | POA: Diagnosis not present

## 2018-06-13 DIAGNOSIS — I10 Essential (primary) hypertension: Secondary | ICD-10-CM | POA: Diagnosis not present

## 2018-06-13 DIAGNOSIS — Z7902 Long term (current) use of antithrombotics/antiplatelets: Secondary | ICD-10-CM | POA: Diagnosis not present

## 2018-06-13 DIAGNOSIS — I214 Non-ST elevation (NSTEMI) myocardial infarction: Secondary | ICD-10-CM | POA: Diagnosis not present

## 2018-06-13 DIAGNOSIS — I471 Supraventricular tachycardia: Secondary | ICD-10-CM | POA: Diagnosis not present

## 2018-06-13 DIAGNOSIS — Z7982 Long term (current) use of aspirin: Secondary | ICD-10-CM | POA: Diagnosis not present

## 2018-06-13 DIAGNOSIS — Z9181 History of falling: Secondary | ICD-10-CM | POA: Diagnosis not present

## 2018-06-13 DIAGNOSIS — I714 Abdominal aortic aneurysm, without rupture: Secondary | ICD-10-CM | POA: Diagnosis not present

## 2018-06-18 DIAGNOSIS — I712 Thoracic aortic aneurysm, without rupture: Secondary | ICD-10-CM | POA: Diagnosis not present

## 2018-06-18 DIAGNOSIS — I214 Non-ST elevation (NSTEMI) myocardial infarction: Secondary | ICD-10-CM | POA: Diagnosis not present

## 2018-06-18 DIAGNOSIS — I714 Abdominal aortic aneurysm, without rupture: Secondary | ICD-10-CM | POA: Diagnosis not present

## 2018-06-18 DIAGNOSIS — Z7902 Long term (current) use of antithrombotics/antiplatelets: Secondary | ICD-10-CM | POA: Diagnosis not present

## 2018-06-18 DIAGNOSIS — Z7982 Long term (current) use of aspirin: Secondary | ICD-10-CM | POA: Diagnosis not present

## 2018-06-18 DIAGNOSIS — I739 Peripheral vascular disease, unspecified: Secondary | ICD-10-CM | POA: Diagnosis not present

## 2018-06-18 DIAGNOSIS — I471 Supraventricular tachycardia: Secondary | ICD-10-CM | POA: Diagnosis not present

## 2018-06-18 DIAGNOSIS — I10 Essential (primary) hypertension: Secondary | ICD-10-CM | POA: Diagnosis not present

## 2018-06-18 DIAGNOSIS — Z87891 Personal history of nicotine dependence: Secondary | ICD-10-CM | POA: Diagnosis not present

## 2018-06-18 DIAGNOSIS — Z9181 History of falling: Secondary | ICD-10-CM | POA: Diagnosis not present

## 2018-06-24 DIAGNOSIS — I714 Abdominal aortic aneurysm, without rupture: Secondary | ICD-10-CM | POA: Diagnosis not present

## 2018-06-24 DIAGNOSIS — Z87891 Personal history of nicotine dependence: Secondary | ICD-10-CM | POA: Diagnosis not present

## 2018-06-24 DIAGNOSIS — I739 Peripheral vascular disease, unspecified: Secondary | ICD-10-CM | POA: Diagnosis not present

## 2018-06-24 DIAGNOSIS — I712 Thoracic aortic aneurysm, without rupture: Secondary | ICD-10-CM | POA: Diagnosis not present

## 2018-06-24 DIAGNOSIS — I10 Essential (primary) hypertension: Secondary | ICD-10-CM | POA: Diagnosis not present

## 2018-06-24 DIAGNOSIS — I471 Supraventricular tachycardia: Secondary | ICD-10-CM | POA: Diagnosis not present

## 2018-06-24 DIAGNOSIS — Z9181 History of falling: Secondary | ICD-10-CM | POA: Diagnosis not present

## 2018-06-24 DIAGNOSIS — Z7902 Long term (current) use of antithrombotics/antiplatelets: Secondary | ICD-10-CM | POA: Diagnosis not present

## 2018-06-24 DIAGNOSIS — I214 Non-ST elevation (NSTEMI) myocardial infarction: Secondary | ICD-10-CM | POA: Diagnosis not present

## 2018-06-24 DIAGNOSIS — Z7982 Long term (current) use of aspirin: Secondary | ICD-10-CM | POA: Diagnosis not present

## 2018-06-25 ENCOUNTER — Ambulatory Visit
Admission: RE | Admit: 2018-06-25 | Discharge: 2018-06-25 | Disposition: A | Discharge status: Home / Self Care | Payer: Medicare Other | Source: Ambulatory Visit | Attending: Vascular Surgery | Admitting: Vascular Surgery

## 2018-06-25 DIAGNOSIS — I714 Abdominal aortic aneurysm, without rupture, unspecified: Secondary | ICD-10-CM

## 2018-06-25 DIAGNOSIS — I71019 Dissection of thoracic aorta, unspecified: Secondary | ICD-10-CM

## 2018-06-25 DIAGNOSIS — N281 Cyst of kidney, acquired: Secondary | ICD-10-CM | POA: Diagnosis not present

## 2018-06-25 DIAGNOSIS — I7101 Dissection of thoracic aorta: Secondary | ICD-10-CM

## 2018-06-25 DIAGNOSIS — R918 Other nonspecific abnormal finding of lung field: Secondary | ICD-10-CM | POA: Diagnosis not present

## 2018-06-25 DIAGNOSIS — K5730 Diverticulosis of large intestine without perforation or abscess without bleeding: Secondary | ICD-10-CM | POA: Diagnosis not present

## 2018-06-25 DIAGNOSIS — K429 Umbilical hernia without obstruction or gangrene: Secondary | ICD-10-CM | POA: Diagnosis not present

## 2018-06-25 MED ORDER — IOPAMIDOL (ISOVUE-370) INJECTION 76%
75.0000 mL | Freq: Once | INTRAVENOUS | Status: AC | PRN
  Administered 2018-06-25: 75 mL via INTRAVENOUS

## 2018-06-27 ENCOUNTER — Ambulatory Visit: Payer: Medicare Other | Admitting: Vascular Surgery

## 2018-06-27 ENCOUNTER — Encounter: Payer: Self-pay | Admitting: Vascular Surgery

## 2018-06-27 ENCOUNTER — Other Ambulatory Visit: Payer: Self-pay

## 2018-06-27 VITALS — BP 157/79 | HR 62 | Temp 98.5°F | Resp 14 | Ht 61.0 in | Wt 127.0 lb

## 2018-06-27 DIAGNOSIS — I71019 Dissection of thoracic aorta, unspecified: Secondary | ICD-10-CM

## 2018-06-27 DIAGNOSIS — I7101 Dissection of thoracic aorta: Secondary | ICD-10-CM | POA: Diagnosis not present

## 2018-06-27 DIAGNOSIS — I714 Abdominal aortic aneurysm, without rupture, unspecified: Secondary | ICD-10-CM

## 2018-06-27 NOTE — Progress Notes (Signed)
Patient ID: Elizabeth [name], female   DOB: 1940-11-08, 77 y.o.   MRN: [id number]  Reason for Consult: AAA   Referred by Kari Baars, MD  Subjective:     HPI:  Elizabeth [name] is a 77 y.o. female known history of abdominal aortic aneurysm also has a left common iliac artery stent that has been followed in [place].  She was recently admitted with type B IMH.  She now follows up with CT scan.  During her hospitalization she had an end STEMI underwent cardiac cath was not a candidate for stenting and Dr. Maren Beach thought her better for medical management rather than bypass given her medical comorbidities in the setting of her IMH.  She also had demonstrated left ICA stenosis 80 to 99% that was asymptomatic although was not corroborated on CT angios.  Since hospitalization her back pain has completely resolved.  She has no abdominal pain.  She does have left hip pain which is stable for her.  She is hopeful to get back to being active.  Past Medical History:  Diagnosis Date  . AAA (abdominal aortic aneurysm) (HCC)   . Hypertension   . Peripheral vascular disease (HCC)    Family History  Problem Relation Age of Onset  . Hypertension Mother   . Stroke Mother   . Hypertension Father   . Stroke Father    Past Surgical History:  Procedure Laterality Date  . APPENDECTOMY    . LEFT HEART CATH AND CORONARY ANGIOGRAPHY N/A 05/19/2018   Procedure: LEFT HEART CATH AND CORONARY ANGIOGRAPHY;  Surgeon: Lyn Records, MD;  Location: [facility];  Service: Cardiovascular;  Laterality: N/A;  . PERIPHERAL VASCULAR CATHETERIZATION Right 02/02/2016   Procedure: Lower Extremity Angiography;  Surgeon: Annice Needy, MD;  Location: [facility] INVASIVE CV LAB;  Service: Cardiovascular;  Laterality: Right;    Short Social History:  Social History   Tobacco Use  . Smoking status: Former Smoker    Last attempt to quit: 02/02/1999    Years since quitting: 19.4  . Smokeless tobacco: Never Used    Substance Use Topics  . Alcohol use: No    Allergies  Allergen Reactions  . Codeine Other (See Comments)    Reaction: Unsure  . Sulfa Antibiotics Other (See Comments)    Mouth blisters    Current Outpatient Medications  Medication Sig Dispense Refill  . amLODipine (NORVASC) 10 MG tablet Take 10 mg by mouth daily.  12  . aspirin EC 81 MG EC tablet Take 1 tablet (81 mg total) by mouth daily. (Patient taking differently: Take 81 mg by mouth daily. Does not take if she has taken Goody HA powder)    . cholecalciferol (VITAMIN D) 1000 units tablet Take 1,000 Units by mouth daily.    . clopidogrel (PLAVIX) 75 MG tablet Take 75 mg by mouth daily.  12  . Cyanocobalamin (VITAMIN B-12 PO) Take 1 tablet by mouth daily.    [name] HYDROcodone-acetaminophen (NORCO) 10-325 MG tablet Take 1 tablet by mouth every 6 (six) hours as needed.    . isosorbide mononitrate (IMDUR) 30 MG 24 hr tablet Take 0.5 tablets (15 mg total) by mouth daily. 15 tablet 0  . metoprolol tartrate (LOPRESSOR) 50 MG tablet Take 2 tablets (100 mg total) by mouth 2 (two) times daily. 120 tablet 0  . Multiple Vitamin (MULTIVITAMIN) tablet Take 1 tablet by mouth daily.    . polyethylene glycol (MIRALAX / GLYCOLAX) packet Take 17 g by mouth  daily. 14 each 0  . potassium citrate (UROCIT-K) 10 MEQ (1080 MG) SR tablet Take 10 mEq by mouth 2 (two) times daily.  12  . rosuvastatin (CRESTOR) 20 MG tablet Take 20 mg by mouth daily.  12  . Aspirin-Acetaminophen-Caffeine (GOODY HEADACHE PO) Take 1 packet by mouth daily as needed (headache).    . nitroGLYCERIN (NITROSTAT) 0.4 MG SL tablet Place 1 tablet (0.4 mg total) under the tongue every 5 (five) minutes as needed for chest pain. (Patient not taking: Reported on 06/27/2018) 25 tablet 3   No current facility-administered medications for this visit.     Review of Systems  Constitutional:  Constitutional negative. HENT: HENT negative.  Eyes: Eyes negative.  Respiratory: Respiratory negative.   Cardiovascular: Cardiovascular negative.  GI: Gastrointestinal negative.  Musculoskeletal: Positive for joint pain.  Neurological: Neurological negative. Hematologic: Hematologic/lymphatic negative.  Psychiatric: Psychiatric negative.        Objective:  Objective   Vitals:   06/27/18 0947  BP: (!) 157/79  Pulse: 62  Resp: 14  Temp: 98.5 F (36.9 C)  TempSrc: Oral  SpO2: 97%  Weight: 127 lb (57.6 kg)  Height: 5\' 1"  (1.549 m)   Body mass index is 24 kg/m.  Physical Exam  Constitutional: She is oriented to person, place, and time. She appears well-developed.  HENT:  Head: Normocephalic.  Eyes: Pupils are equal, round, and reactive to light.  Neck: Normal range of motion. Neck supple.  Cardiovascular: Normal rate.  Pulses:      Radial pulses are 2+ on the right side, and 0 on the left side.       Femoral pulses are 2+ on the right side, and 1+ on the left side.      Popliteal pulses are 0 on the left side.       Posterior tibial pulses are 2+ on the right side.  Pulmonary/Chest: Effort normal.  Abdominal: Soft. She exhibits no mass.  Musculoskeletal: Normal range of motion. She exhibits no edema.  Neurological: She is alert and oriented to person, place, and time.  Skin: Skin is warm and dry. Capillary refill takes less than 2 seconds.  Psychiatric: She has a normal mood and affect. Her behavior is normal. Judgment and thought content normal.    Data: IMPRESSION: 1. 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 The affected segment of the aorta remains mildly aneurysmal with a stable maximal diameter of 4.2 cm. 2. 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 3. Stable fusiform aneurysmal dilatation of the infrarenal abdominal aorta with a maximal diameter of 4.2 cm. 4. Unchanged appearance of the  partially malpositioned left iliac stent. No significant in stent stenosis or interval occlusion compared to the relatively recent prior imaging. 5. Additional ancillary findings as above without significant interval change compared to 05/27/2018.   I independently reviewed the CT scan compared to her previous scan while in the inpatient and [facility].  Patient did not want to review with me but I did draw pictures for her.  Demonstrates 4.2 cm thoracic abdominal aortic aneurysms with a bridge between them.  She has a heavily diseased thoracic aorta and the small area of PACU in the proximal descending remains.  There is also a larger PA view distally where the thoracic aorta has its maximum diameter.  There is a left common iliac artery stent that appears to have stenosis  residual.  Her access arteries being her bilateral external iliac arteries appear diminutive.     Assessment/Plan:     77 year old female was recently admitted with type B IMH complicated by NSTEMI.  CT scan prior to today's visit demonstrates resolving IMH with multiple PAU's in her thoracic aorta and maximal diameter 4.2 centimeters and similar in her abdominal aorta which is stable.  She has very diminutive access vessels for thoracic endograft and and given that she has elected medical management rather than CABG for her coronary disease I think she is not a very good operative candidate.  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  Given that she is not a good candidate we will watch her with repeat CT scan in 6 months to evaluate both the thoracic and abdominal aorta.  I have counseled her on keeping her blood pressure under control which was very elevated at her  initial presentation and I have counseled her on avoiding straining and strenuous activity although she can perform activities of daily living as needed without much concern.  We have discussed the signs and symptoms of concern being chest or abdominal or back pain for which she would need emergent evaluation.  Short of any of these we will see her in 6 months with repeat CT scan.  She demonstrates very good understanding in the presence of her family.     Maeola HarmanBrandon Christopher Cain MD [facility]

## 2018-07-14 DIAGNOSIS — I714 Abdominal aortic aneurysm, without rupture: Secondary | ICD-10-CM | POA: Diagnosis not present

## 2018-07-14 DIAGNOSIS — I739 Peripheral vascular disease, unspecified: Secondary | ICD-10-CM | POA: Diagnosis not present

## 2018-07-14 DIAGNOSIS — I10 Essential (primary) hypertension: Secondary | ICD-10-CM | POA: Diagnosis not present

## 2018-07-14 DIAGNOSIS — I2510 Atherosclerotic heart disease of native coronary artery without angina pectoris: Secondary | ICD-10-CM | POA: Diagnosis not present

## 2018-09-03 ENCOUNTER — Ambulatory Visit: Payer: Medicare Other | Admitting: Cardiovascular Disease

## 2018-09-03 ENCOUNTER — Encounter: Payer: Self-pay | Admitting: Cardiovascular Disease

## 2018-09-03 DIAGNOSIS — I739 Peripheral vascular disease, unspecified: Secondary | ICD-10-CM

## 2018-09-03 DIAGNOSIS — I714 Abdominal aortic aneurysm, without rupture, unspecified: Secondary | ICD-10-CM

## 2018-09-03 DIAGNOSIS — I10 Essential (primary) hypertension: Secondary | ICD-10-CM

## 2018-09-03 MED ORDER — LISINOPRIL 20 MG PO TABS: 20.0000 mg | ORAL_TABLET | Freq: Every day | ORAL | 3 refills | Status: DC

## 2018-09-03 NOTE — Assessment & Plan Note (Signed)
History of non-STEMI with cardiac catheterization performed by Dr. Katrinka BlazingSmith on 10 /14/19 revealing three-vessel disease with an EF of 55%.  She was not deemed to be a interventional candidate and she was turned down by surgery for CABG.  She is being treated medically and has no angina.

## 2018-09-03 NOTE — Assessment & Plan Note (Signed)
History of hypertension blood pressure measured today at 188/89.  She is on lisinopril, amlodipine and Lopressor.  We will increase her lisinopril from 10 to 20 mg a day, have her keep a daily blood pressure log for 30 days and follow-up with Baxter HireKristen in the hypertension clinic.

## 2018-09-03 NOTE — Patient Instructions (Signed)
Medication Instructions:  INCREASE YOUR LISINOPRIL TO 20 MG BY MOUTH DAILY  If you need a refill on your cardiac medications before your next appointment, please call your pharmacy.   Lab work: NONE If you have labs (blood work) drawn today and your tests are completely normal, you will receive your results only by: [name] MyChart Message (if you have MyChart) OR . A paper copy in the mail If you have any lab test that is abnormal or we need to change your treatment, we will call you to review the results.  Testing/Procedures: NONE  Follow-Up: At [facility], you and your health needs are our priority.  As part of our continuing mission to provide you with exceptional heart care, we have created designated Provider Care Teams.  These Care Teams include your primary Cardiologist (physician) and Advanced Practice Providers (APPs -  Physician Assistants and Nurse Practitioners) who all work together to provide you with the care you need, when you need it. . You will need a follow up appointment in 6 MONTHS WITH LUKE KILROY, PA AND 12 MONTHS WITH DR. Allyson Sabal.  Please call our office 2 months in advance to schedule this appointment.  You may see Dr. Allyson Sabal or one of the following Advanced Practice Providers on your designated Care Team:   . Corine Shelter, [place] . Azalee Course, PA-C . Micah Flesher, PA-C . Joni Reining, DNP . Theodore Demark, PA-C . Judy Pimple, PA-C . Marjie Skiff, PA-C  Any Other Special Instructions Will Be Listed Below (If Applicable). DR. Allyson Sabal RECOMMENDS THAT YOU PURCHASE AN OMRON BLOOD PRESSURE MONITOR AND FOLLOW UP WITH CLINICAL PHARMACIST IN OUR HYPERTENSION CLINIC AFTER KEEPING A BLOOD PRESSURE LOG FOR 30 DAYS

## 2018-09-03 NOTE — Assessment & Plan Note (Signed)
Patient had back pain with demonstration of a penetrating thoracic aortic hematoma followed by Dr. Randie Heinz.

## 2018-09-03 NOTE — Assessment & Plan Note (Signed)
Measures 4 cm, followed by Dr. Randie Heinz

## 2018-09-03 NOTE — Assessment & Plan Note (Signed)
She of iliac stents in the past by Dr. Wyn Quaker at [facility]

## 2018-09-03 NOTE — Progress Notes (Signed)
09/03/2018 Elizabeth [name]   1941-01-30  [id number]  Primary Physician Kari BaarsHawkins, Edward, MD Primary Cardiologist: Runell GessJonathan J Charnita Trudel MD Nicholes CalamityFACP, FACC, FAHA, [place]  HPI:  Elizabeth [name] is a 78 y.o. thin appearing widowed Caucasian female mother of 2, grandmother of 4 grandchildren is accompanied by her Sister [name] today.  I am seeing her for her first post hospital discharge visit after hospitalization back in October.  She did see [name] in the office 06/05/2018.  She has a history of treated hypertension hyperlipidemia.  She is not diabetic.  She smoked remotely.  She is never had a stroke but did have a non-STEMI back in October.  She has had bilateral carotid endarterectomies by Dr. Wyn Quakerew at [facility], iliac stenting with a known small abdominal aortic aneurysm.  She was admitted in transfer [facility] with back pain.  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  Enzymes were positive and when I saw her she had EKG changes.  Troponins rose to 7.5 and she ultimately underwent diagnostic coronary arteriography by Dr. Katrinka BlazingSmith on 05/19/2018 revealing diffuse three-vessel disease thought not to be percutaneous interventional candidate.  She was turned down by T CTS for coronary artery bypass grafting and medical therapy was recommended.   Current Meds  Medication Sig  . amLODipine (NORVASC) 10 MG tablet Take 10 mg by mouth daily.  [name]. aspirin EC 81 MG EC tablet Take 1 tablet (81 mg total) by mouth daily. (Patient taking differently: Take 81 mg by mouth daily. Does not take if she has taken Goody HA powder)  . Aspirin-Acetaminophen-Caffeine (GOODY HEADACHE PO) Take 1 packet by mouth daily as needed (headache).  . cholecalciferol (VITAMIN D) 1000 units tablet Take 1,000 Units by mouth daily.  . clopidogrel (PLAVIX) 75 MG tablet Take 75 mg by mouth daily.  . Cyanocobalamin (VITAMIN B-12 PO) Take 1 tablet by mouth daily.  [name]. HYDROcodone-acetaminophen (NORCO) 10-325 MG tablet Take  1 tablet by mouth every 6 (six) hours as needed.  . isosorbide mononitrate (IMDUR) 30 MG 24 hr tablet Take 0.5 tablets (15 mg total) by mouth daily.  [name]. lisinopril (PRINIVIL,ZESTRIL) 10 MG tablet Take 10 mg by mouth daily.  . metoprolol tartrate (LOPRESSOR) 50 MG tablet Take 2 tablets (100 mg total) by mouth 2 (two) times daily.  . Multiple Vitamin (MULTIVITAMIN) tablet Take 1 tablet by mouth daily.  . nitroGLYCERIN (NITROSTAT) 0.4 MG SL tablet Place 1 tablet (0.4 mg total) under the tongue every 5 (five) minutes as needed for chest pain.  . polyethylene glycol (MIRALAX / GLYCOLAX) packet Take 17 g by mouth daily.  . potassium citrate (UROCIT-K) 10 MEQ (1080 MG) SR tablet Take 10 mEq by mouth 2 (two) times daily.  . rosuvastatin (CRESTOR) 20 MG tablet Take 20 mg by mouth daily.     Allergies  Allergen Reactions  . Codeine Other (See Comments)    Reaction: Unsure  . Sulfa Antibiotics Other (See Comments)    Mouth blisters    Social History   Socioeconomic History  . Marital status: Widowed    Spouse name: Not on file  . Number of children: Not on file  . Years of education: Not on file  . Highest education level: Not on file  Occupational History  . Not on file  Social Needs  . Financial resource strain: Not on file  . Food insecurity:    Worry: Not on file    Inability: Not on file  .  Transportation needs:    Medical: Not on file    Non-medical: Not on file  Tobacco Use  . Smoking status: Former Smoker    Last attempt to quit: 02/02/1999    Years since quitting: 19.5  . Smokeless tobacco: Never Used  Substance and Sexual Activity  . Alcohol use: No  . Drug use: No  . Sexual activity: Not Currently  Lifestyle  . Physical activity:    Days per week: Not on file    Minutes per session: Not on file  . Stress: Not on file  Relationships  . Social connections:    Talks on phone: Not on file    Gets together: Not on file    Attends religious service: Not on file     Active member of club or organization: Not on file    Attends meetings of clubs or organizations: Not on file    Relationship status: Not on file  . Intimate partner violence:    Fear of current or ex partner: Not on file    Emotionally abused: Not on file    Physically abused: Not on file    Forced sexual activity: Not on file  Other Topics Concern  . Not on file  Social History Narrative  . Not on file     Review of Systems: General: negative for chills, fever, night sweats or weight changes.  Cardiovascular: negative for chest pain, dyspnea on exertion, edema, orthopnea, palpitations, paroxysmal nocturnal dyspnea or shortness of breath Dermatological: negative for rash Respiratory: negative for cough or wheezing Urologic: negative for hematuria Abdominal: negative for nausea, vomiting, diarrhea, bright red blood per rectum, melena, or hematemesis Neurologic: negative for visual changes, syncope, or dizziness All other systems reviewed and are otherwise negative except as noted above.    Blood pressure (!) 188/89, pulse 76, height 5\' 1"  (1.549 m), weight 126 lb (57.2 kg), SpO2 96 %.  General appearance: alert and no distress Neck: no adenopathy, no JVD, supple, symmetrical, trachea midline, thyroid not enlarged, symmetric, no tenderness/mass/nodules and Soft bilateral carotid bruits Lungs: clear to auscultation bilaterally Heart: regular rate and rhythm, S1, S2 normal, no murmur, click, rub or gallop Extremities: extremities normal, atraumatic, no cyanosis or edema Pulses: 2+ and symmetric Skin: Skin color, texture, turgor normal. No rashes or lesions Neurologic: Alert and oriented X 3, normal strength and tone. Normal symmetric reflexes. Normal coordination and gait  EKG not performed today  ASSESSMENT AND PLAN:   AAA (abdominal aortic aneurysm) (HCC) Measures 4 cm, followed by Dr. Randie Heinzain  Peripheral vascular disease [facility](HCC) She of iliac stents in the past by Dr. Wyn Quakerew at  [facility]  Hypertension History of hypertension blood pressure measured today at 188/89.  She is on lisinopril, amlodipine and Lopressor.  We will increase her lisinopril from 10 to 20 mg a day, have her keep a daily blood pressure log for 30 days and follow-up with Baxter HireKristen in the hypertension clinic.  Aortic dissection [facility](HCC) Patient had back pain with demonstration of a penetrating thoracic aortic hematoma followed by Dr. Randie Heinzain.  Non-ST elevation (NSTEMI) myocardial infarction [facility](HCC) History of non-STEMI with cardiac catheterization performed by Dr. Katrinka BlazingSmith on 10 /14/19 revealing three-vessel disease with an EF of 55%.  She was not deemed to be a interventional candidate and she was turned down by surgery for CABG.  She is being treated medically and has no angina.      Runell GessJonathan J. Nelline Lio MD FACP,FACC,FAHA, [facility] 09/03/2018 1:55 PM

## 2018-10-13 DIAGNOSIS — I739 Peripheral vascular disease, unspecified: Secondary | ICD-10-CM | POA: Diagnosis not present

## 2018-10-13 DIAGNOSIS — I2510 Atherosclerotic heart disease of native coronary artery without angina pectoris: Secondary | ICD-10-CM | POA: Diagnosis not present

## 2018-10-13 DIAGNOSIS — J449 Chronic obstructive pulmonary disease, unspecified: Secondary | ICD-10-CM | POA: Diagnosis not present

## 2018-10-13 DIAGNOSIS — Z79891 Long term (current) use of opiate analgesic: Secondary | ICD-10-CM | POA: Diagnosis not present

## 2018-10-13 DIAGNOSIS — I10 Essential (primary) hypertension: Secondary | ICD-10-CM | POA: Diagnosis not present

## 2018-10-14 ENCOUNTER — Ambulatory Visit (INDEPENDENT_AMBULATORY_CARE_PROVIDER_SITE_OTHER): Payer: Medicare Other | Admitting: Pharmacist

## 2018-10-14 VITALS — BP 190/94 | HR 69 | Resp 14 | Ht 61.0 in | Wt 127.4 lb

## 2018-10-14 DIAGNOSIS — I10 Essential (primary) hypertension: Secondary | ICD-10-CM

## 2018-10-14 MED ORDER — LISINOPRIL 20 MG PO TABS: 40.0000 mg | ORAL_TABLET | ORAL | 3 refills | Status: DC

## 2018-10-14 MED ORDER — HYDRALAZINE HCL 25 MG PO TABS: 25.0000 mg | ORAL_TABLET | Freq: Two times a day (BID) | ORAL | 1 refills | Status: DC | PRN

## 2018-10-14 NOTE — Patient Instructions (Signed)
Return for a  follow up appointment in 3 WEEKS  Go to the lab in 2 WEEKS  Check your blood pressure at home daily (if able) and keep record of the readings.  Take your BP meds as follows:  *INCREASE LISINOPRIL TO 40MG  EVERY MORNING* *TAKE AMLODIPINE EVERY EVENING* *TAKE HYDRALAZINE 25MG  AS NEEDED FOR BLOOD PRESSURE ABOVE 180*  Bring all of your meds, your BP cuff and your record of home blood pressures to your next appointment.  Exercise as you're able, try to walk approximately 30 minutes per day.  Keep salt intake to a minimum, especially watch canned and prepared boxed foods.  Eat more fresh fruits and vegetables and fewer canned items.  Avoid eating in fast food restaurants.    HOW TO TAKE YOUR BLOOD PRESSURE: . Rest 5 minutes before taking your blood pressure. .  Don't smoke or drink caffeinated beverages for at least 30 minutes before. . Take your blood pressure before (not after) you eat. . Sit comfortably with your back supported and both feet on the floor (don't cross your legs). . Elevate your arm to heart level on a table or a desk. . Use the proper sized cuff. It should fit smoothly and snugly around your bare upper arm. There should be enough room to slip a fingertip under the cuff. The bottom edge of the cuff should be 1 inch above the crease of the elbow. . Ideally, take 3 measurements at one sitting and record the average.

## 2018-10-14 NOTE — Progress Notes (Signed)
Patient ID: [name]                 DOB: 10-01-1940                      MRN: [id number]     HPI: Elizabeth [name] is a 78 y.o. female referred by Dr. Allyson [name]  to HTN clinic.  PMH includes hypertension, hyperlipidemia, s/p NSTEMI, PVD, iliac stenting, and AAA. She presets to clinic accompany by her son. Denies dizziness, swelling, chest pain, blurry vision, increased fatigue or frequent headaches.  Patient is very concern about elevated BP and will like something that act quickly to use at home as needed.   Current HTN meds:  amlodipine 10mg  daily Isosorbide mononitrate 15mg  daily Lisinopril 20mg  daily (DOSE INCREASED BY DR Elizabeth [name] ON 1/29) Metoprolol tartare 100mg  twice daily  Intolerance: Diuretics- patient preference to avoid for now due to baseline nocturia  BP goal: 130/80  Family History: hypertension and stroke in mother and father  Social History: former smoker, denies alcohol use  Diet: working on low sodium diet since last hospital admission  Exercise: activities of daily living due to hip problems and caine needed for ambulaiton  Home BP readings:  11 readings; average 176/92  Wt Readings from Last 3 Encounters:  10/14/18 127 lb 6.4 oz (57.8 kg)  09/03/18 126 lb (57.2 kg)  06/27/18 127 lb (57.6 kg)   BP Readings from Last 3 Encounters:  10/14/18 (!) 190/94  09/03/18 (!) 188/89  06/27/18 (!) 157/79   Pulse Readings from Last 3 Encounters:  10/14/18 69  09/03/18 76  06/27/18 62    Past Medical History:  Diagnosis Date  . AAA (abdominal aortic aneurysm) (HCC)   . Hypertension   . Peripheral vascular disease (HCC)     Current Outpatient Medications on File Prior to Visit  Medication Sig Dispense Refill  . amLODipine (NORVASC) 10 MG tablet Take 10 mg by mouth daily.  12  . aspirin EC 81 MG EC tablet Take 1 tablet (81 mg total) by mouth daily. (Patient taking differently: Take 81 mg by mouth daily. Does not take if she has taken Goody HA powder)    .  Aspirin-Acetaminophen-Caffeine (GOODY HEADACHE PO) Take 1 packet by mouth daily as needed (headache).    . cholecalciferol (VITAMIN D) 1000 units tablet Take 1,000 Units by mouth daily.    . clopidogrel (PLAVIX) 75 MG tablet Take 75 mg by mouth daily.  12  . Cyanocobalamin (VITAMIN B-12 PO) Take 1 tablet by mouth daily.    [name] HYDROcodone-acetaminophen (NORCO) 10-325 MG tablet Take 1 tablet by mouth every 6 (six) hours as needed.    . isosorbide mononitrate (IMDUR) 30 MG 24 hr tablet Take 0.5 tablets (15 mg total) by mouth daily. 15 tablet 0  . metoprolol tartrate (LOPRESSOR) 50 MG tablet Take 2 tablets (100 mg total) by mouth 2 (two) times daily. 120 tablet 0  . Multiple Vitamin (MULTIVITAMIN) tablet Take 1 tablet by mouth daily.    . nitroGLYCERIN (NITROSTAT) 0.4 MG SL tablet Place 1 tablet (0.4 mg total) under the tongue every 5 (five) minutes as needed for chest pain. 25 tablet 3  . polyethylene glycol (MIRALAX / GLYCOLAX) packet Take 17 g by mouth daily. 14 each 0  . potassium citrate (UROCIT-K) 10 MEQ (1080 MG) SR tablet Take 10 mEq by mouth 2 (two) times daily.  12  . rosuvastatin (CRESTOR) 20 MG tablet Take 20 mg by  mouth daily.  12   No current facility-administered medications on file prior to visit.     Allergies  Allergen Reactions  . Codeine Other (See Comments)    Reaction: Unsure  . Sulfa Antibiotics Other (See Comments)    Mouth blisters    Blood pressure (!) 190/94, pulse 69, resp. rate 14, height 5\' 1"  (1.549 m), weight 127 lb 6.4 oz (57.8 kg), SpO2 95 %.  Hypertension BP remains above goal and significantly higher than previous readings. Patient is very anxious about her BP and potential for AAA rapture, and this may be affecting BP readings as well.  Will increase lisinopril to 40mg  daily, add hydralazine 25mg  BID PRN for BP > 180 ,and continue low sodium diet. Patient is to repeat BMET in 2 weeks, and continue to monitor home BP twice daily. Plan to change metoprolol to  carvedilol 25mg  twice daily during next office visit if additional BP control needed.    Elizabeth [name] PharmD, BCPS, CPP [facility] [address] [place] [address] 10/17/2018 8:20 AM

## 2018-10-17 NOTE — Assessment & Plan Note (Signed)
BP remains above goal and significantly higher than previous readings. Patient is very anxious about her BP and potential for AAA rapture, and this may be affecting BP readings as well.  Will increase lisinopril to 40mg  daily, add hydralazine 25mg  BID PRN for BP > 180 ,and continue low sodium diet. Patient is to repeat BMET in 2 weeks, and continue to monitor home BP twice daily. Plan to change metoprolol to carvedilol 25mg  twice daily during next office visit if additional BP control needed.

## 2018-10-27 DIAGNOSIS — I10 Essential (primary) hypertension: Secondary | ICD-10-CM | POA: Diagnosis not present

## 2018-10-28 LAB — BASIC METABOLIC PANEL
BUN / CREAT RATIO: 16 (ref 12–28)
BUN: 18 mg/dL (ref 8–27)
CHLORIDE: 103 mmol/L (ref 96–106)
CO2: 23 mmol/L (ref 20–29)
Calcium: 9.8 mg/dL (ref 8.7–10.3)
Creatinine, Ser: 1.1 mg/dL — ABNORMAL HIGH (ref 0.57–1.00)
GFR calc non Af Amer: 49 mL/min/{1.73_m2} — ABNORMAL LOW (ref >59)
GFR, EST AFRICAN AMERICAN: 56 mL/min/{1.73_m2} — AB (ref >59)
Glucose: 94 mg/dL (ref 65–99)
Potassium: 4.6 mmol/L (ref 3.5–5.2)
SODIUM: 142 mmol/L (ref 134–144)

## 2018-10-30 ENCOUNTER — Telehealth: Payer: Self-pay | Admitting: Pharmacist

## 2018-10-30 MED ORDER — LISINOPRIL 40 MG PO TABS: 40.0000 mg | ORAL_TABLET | Freq: Every day | ORAL | 1 refills | Status: DC

## 2018-10-30 NOTE — Telephone Encounter (Signed)
Patient denies dizziness, swelling , cough or increased fatigue.  Home BP readying are "better", renal function stable, and no ADR noted.  Will send Lisinopril refill to prefer pharmacy and follow up as needed. Plan to schedule tele-care visit ASAP.

## 2018-11-04 ENCOUNTER — Ambulatory Visit: Payer: Medicare Other

## 2018-11-28 ENCOUNTER — Telehealth: Payer: Self-pay | Admitting: Cardiovascular Disease

## 2018-11-28 ENCOUNTER — Telehealth: Payer: Self-pay | Admitting: Pharmacist

## 2018-11-28 NOTE — Telephone Encounter (Signed)
LMOM; patient to call back and schedule a phone follow up with pharmacist.

## 2018-11-28 NOTE — Telephone Encounter (Signed)
Pt returned this office call and apologized for not picking up she would like a call back please.

## 2018-12-01 NOTE — Telephone Encounter (Signed)
Returned call to patient regarding her cancelled CV[facility] hypertension appointment.  She saw Raquel Rodriguez-[name] on March 10, at which time her lisinopril was increased to 40 mg daily and hydralazine 25 mg to be taken up to bid for systolic pressure > 180.    Today patient reports feeling well.  She lives in an apartment and has been able to get outside some days for fresh air or to pick up her order of groceries from the store.  She states that in the past few weeks her home BP readings have mostly ranged from 125-153/70-80, and only twice since seeing Raquel 6 weeks ago, has she had to use a dose of hydralazine.    She has worked hard to control her sodium intake, is not cooking meals with salt, but only adding it to a few foods at the table, mostly beans and potatoes.  She has switched to unsalted butter.    I have asked that she mail her list of home blood pressure readings into the office at her earliest convenience.  For now, she seems to be doing well and I would not recommend making any changes to her regimen, but would like to see what her average over the past few weeks is, before determining if follow up is needed.  Patient voiced understanding, address given and she will mail readings to [place].

## 2018-12-07 ENCOUNTER — Other Ambulatory Visit: Payer: Self-pay | Admitting: Cardiovascular Disease

## 2018-12-09 ENCOUNTER — Other Ambulatory Visit: Payer: Self-pay

## 2018-12-09 DIAGNOSIS — I71019 Dissection of thoracic aorta, unspecified: Secondary | ICD-10-CM

## 2018-12-09 DIAGNOSIS — I7101 Dissection of thoracic aorta: Secondary | ICD-10-CM

## 2018-12-09 NOTE — Telephone Encounter (Signed)
Received copy of her home blood pressure readings, 36 readings in total.  Her average from those was 142/79, with a range of 114-186/68-106.  Because of the wide variability I would not recommend any changes currently.  She took 2 doses of hydralazine for BP > 180 in the past month.    Spoke with patient, recommended that she continue all meds, but now take the hydralazine any time her systolic pressure is > 160.  Patient voiced understanding

## 2018-12-19 ENCOUNTER — Ambulatory Visit
Admission: RE | Admit: 2018-12-19 | Discharge: 2018-12-19 | Disposition: A | Discharge status: Home / Self Care | Payer: Medicare Other | Source: Ambulatory Visit | Attending: Vascular Surgery | Admitting: Vascular Surgery

## 2018-12-19 DIAGNOSIS — I71019 Dissection of thoracic aorta, unspecified: Secondary | ICD-10-CM

## 2018-12-19 DIAGNOSIS — I7101 Dissection of thoracic aorta: Secondary | ICD-10-CM

## 2018-12-19 MED ORDER — IOPAMIDOL (ISOVUE-370) INJECTION 76%
100.0000 mL | Freq: Once | INTRAVENOUS | Status: AC | PRN
  Administered 2018-12-19: 11:00:00 75 mL via INTRAVENOUS

## 2018-12-25 ENCOUNTER — Telehealth (HOSPITAL_COMMUNITY): Payer: Self-pay | Admitting: Rehabilitation

## 2018-12-25 NOTE — Telephone Encounter (Signed)
The above patient or their representative was contacted and gave the following answers to these questions:         Do you have any of the following symptoms? No  Fever                    Cough                   Shortness of breath  Do  you have any of the following other symptoms? No   muscle pain         vomiting,        diarrhea        rash         weakness        red eye        abdominal pain         bruising          bruising or bleeding              joint pain           severe headache    Have you been in contact with someone who was or has been sick in the past 2 weeks? No  Yes                 Unsure                         Unable to assess   Does the person that you were in contact with have any of the following symptoms?   Cough         shortness of breath           muscle pain         vomiting,            diarrhea            rash            weakness           fever            red eye           abdominal pain           bruising  or  bleeding                joint pain                severe headache               Have you  or someone you have been in contact with traveled internationally in th last month? No        If yes, which countries?   Have you  or someone you have been in contact with traveled outside [place] in th last month? No         If yes, which state and city?   COMMENTS OR ACTION PLAN FOR THIS PATIENT:          

## 2018-12-26 ENCOUNTER — Ambulatory Visit: Payer: Medicare Other | Admitting: Vascular Surgery

## 2018-12-26 ENCOUNTER — Other Ambulatory Visit: Payer: Self-pay

## 2018-12-26 ENCOUNTER — Ambulatory Visit (INDEPENDENT_AMBULATORY_CARE_PROVIDER_SITE_OTHER): Payer: Medicare Other | Admitting: Vascular Surgery

## 2018-12-26 ENCOUNTER — Encounter: Payer: Self-pay | Admitting: Vascular Surgery

## 2018-12-26 VITALS — BP 152/85 | HR 65 | Temp 97.1°F | Resp 18 | Ht 61.0 in | Wt 125.6 lb

## 2018-12-26 DIAGNOSIS — I739 Peripheral vascular disease, unspecified: Secondary | ICD-10-CM

## 2018-12-26 DIAGNOSIS — I714 Abdominal aortic aneurysm, without rupture, unspecified: Secondary | ICD-10-CM

## 2018-12-26 DIAGNOSIS — I7101 Dissection of thoracic aorta: Secondary | ICD-10-CM | POA: Diagnosis not present

## 2018-12-26 DIAGNOSIS — I71019 Dissection of thoracic aorta, unspecified: Secondary | ICD-10-CM

## 2018-12-26 NOTE — Progress Notes (Signed)
Patient ID: Elizabeth [name], female   DOB: 1940-12-05, 78 y.o.   MRN: [id number]  Reason for Consult: Follow-up   Referred by Kari Baars, MD  Subjective:     HPI:  Elizabeth [name] is a 77 y.o. 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  Previously had undergone stenting of her left common iliac artery and [place].  Last follow-up 6 months ago with CT scan.  Since that time states blood pressure is better controlled.  Does not have any new chest pain.  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  She has done well.  Does have left hip pain which is her chief complaint.  She remains active although has been somewhat secluded given novel coronavirus.  Past Medical History:  Diagnosis Date  . AAA (abdominal aortic aneurysm) (HCC)   . Hypertension   . Peripheral vascular disease (HCC)    Family History  Problem Relation Age of Onset  . Hypertension Mother   . Stroke Mother   . Hypertension Father   . Stroke Father    Past Surgical History:  Procedure Laterality Date  . APPENDECTOMY    . LEFT HEART CATH AND CORONARY ANGIOGRAPHY N/A 05/19/2018   Procedure: LEFT HEART CATH AND CORONARY ANGIOGRAPHY;  Surgeon: Lyn Records, MD;  Location: [facility];  Service: Cardiovascular;  Laterality: N/A;  . PERIPHERAL VASCULAR CATHETERIZATION Right 02/02/2016   Procedure: Lower Extremity Angiography;  Surgeon: Annice Needy, MD;  Location: [facility] INVASIVE CV LAB;  Service: Cardiovascular;  Laterality: Right;    Short Social History:  Social History   Tobacco Use  . Smoking status: Former Smoker    Last attempt to quit: 02/02/1999    Years since quitting: 19.9  . Smokeless tobacco: Never Used  Substance Use Topics  . Alcohol use: No    Allergies  Allergen Reactions  . Codeine Other (See Comments)    Reaction: Unsure  . Sulfa Antibiotics Other (See  Comments)    Mouth blisters    Current Outpatient Medications  Medication Sig Dispense Refill  . amLODipine (NORVASC) 10 MG tablet Take 10 mg by mouth daily.  12  . aspirin EC 81 MG EC tablet Take 1 tablet (81 mg total) by mouth daily. (Patient taking differently: Take 81 mg by mouth daily. Does not take if she has taken Goody HA powder)    . Aspirin-Acetaminophen-Caffeine (GOODY HEADACHE PO) Take 1 packet by mouth daily as needed (headache).    . cholecalciferol (VITAMIN D) 1000 units tablet Take 1,000 Units by mouth daily.    . clopidogrel (PLAVIX) 75 MG tablet Take 75 mg by mouth daily.  12  . Cyanocobalamin (VITAMIN B-12 PO) Take 1 tablet by mouth daily.    . hydrALAZINE (APRESOLINE) 25 MG tablet PLEASE SEE ATTACHED FOR DETAILED DIRECTIONS (Patient taking differently: Take 1 tablet by mouth as needed for systolic BP > 160) 30 tablet 10  . HYDROcodone-acetaminophen (NORCO) 10-325 MG tablet Take 1 tablet by mouth every 6 (six) hours as needed.    . isosorbide mononitrate (IMDUR) 30 MG 24 hr tablet Take 0.5 tablets (15 mg total) by mouth daily. 15 tablet 0  . lisinopril (PRINIVIL,ZESTRIL) 40 MG tablet Take 1 tablet (40 mg total) by mouth daily. 90 tablet 1  . metoprolol tartrate (LOPRESSOR) 50 MG tablet Take 2 tablets (100 mg total) by mouth  2 (two) times daily. 120 tablet 0  . Multiple Vitamin (MULTIVITAMIN) tablet Take 1 tablet by mouth daily.    . nitroGLYCERIN (NITROSTAT) 0.4 MG SL tablet Place 1 tablet (0.4 mg total) under the tongue every 5 (five) minutes as needed for chest pain. 25 tablet 3  . polyethylene glycol (MIRALAX / GLYCOLAX) packet Take 17 g by mouth daily. 14 each 0  . potassium chloride (K-DUR) 10 MEQ tablet Take 10 mEq by mouth 2 (two) times daily.    . potassium citrate (UROCIT-K) 10 MEQ (1080 MG) SR tablet Take 10 mEq by mouth 2 (two) times daily.  12  . rosuvastatin (CRESTOR) 20 MG tablet Take 20 mg by mouth daily.  12   No current facility-administered medications for  this visit.     Review of Systems  Constitutional:  Constitutional negative. HENT: HENT negative.  Eyes: Eyes negative.  Respiratory: Respiratory negative.  Cardiovascular: Cardiovascular negative.  GI: Gastrointestinal negative.  Musculoskeletal: Positive for gait problem and joint pain.  Psychiatric: Psychiatric negative.        Objective:  Objective   Vitals:   12/26/18 0934  BP: (!) 152/85  Pulse: 65  Resp: 18  Temp: (!) 97.1 F (36.2 C)  SpO2: 96%  Weight: 125 lb 9.6 oz (57 kg)  Height: 5\' 1"  (1.549 m)   Body mass index is 23.73 kg/m.  Physical Exam HENT:     Head: Normocephalic.     Mouth/Throat:     Mouth: Mucous membranes are moist.  Eyes:     Pupils: Pupils are equal, round, and reactive to light.  Cardiovascular:     Rate and Rhythm: Normal rate.     Pulses:          Femoral pulses are 1+ on the right side and 1+ on the left side.      Popliteal pulses are 0 on the right side and 0 on the left side.  Pulmonary:     Effort: Pulmonary effort is normal.  Abdominal:     General: Abdomen is flat.     Palpations: Abdomen is soft. There is no mass.  Musculoskeletal: Normal range of motion.        General: No swelling.  Skin:    General: Skin is warm and dry.     Capillary Refill: Capillary refill takes less than 2 seconds.  Neurological:     General: No focal deficit present.     Mental Status: She is alert and oriented to person, place, and time.  Psychiatric:        Mood and Affect: Mood normal.        Behavior: Behavior normal.        Thought Content: Thought content normal.        Judgment: Judgment normal.     Data: CTA IMPRESSION: Interval healing of the penetrating ulcer at the posterior aorta just beyond the aortic arch, with resolution of intramural hematoma. Aortic Atherosclerosis (ICD10-I70.0).  Improved appearance of the distal thoracic aortic penetrating ulcer, with essentially unchanged size of the associated thoracic aortic  aneurysm. Aortic aneurysm NOS (ICD10-I71.9).  Redemonstration of infrarenal abdominal aortic aneurysm measuring 4.3 cm, with similar degree of diffuse aortic atherosclerosis.  Bilateral mild-to-moderate ileal femoral atherosclerosis, with patent left-sided iliac stent system.  Redemonstration of mesenteric arterial disease, with estimated 50% narrowing at the origin of both celiac artery and SMA. IMA remains patent.  Left renal artery atherosclerosis.  Redemonstration of atretic right kidney, and associated right renal  artery disease.  Four vessel arch, with high-grade stenosis of left subclavian artery.  Left main and 3 vessel coronary artery disease.  Emphysema (ICD10-J43.9).  I have independently interpreted her CT Angie of her chest abdomen pelvis.  Agree with above that IMH has healed.  Infrarenal aneurysm stable in size with atretic right kidney and tiny right renal artery.  Would not be great candidate for standard endovascular aneurysm repair given persistent left common iliac artery stenosis with stent in place as well as very short neck of her aneurysm with occluded left subclavian artery.      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  She also has previous carotid artery intervention at [facility] as well as a left common iliac artery stent at [place].  Patient states she desires to follow in [place] at this time.  I reviewed her CT scan we will plan to follow her up in 1 year.  I discussed with her the signs and symptoms of worsened type B IMH although I think this is unlikely with her blood pressure controlled.  She demonstrates good understanding we will see her in 1 year.    Maeola Harman MD [facility]

## 2019-01-20 DIAGNOSIS — I714 Abdominal aortic aneurysm, without rupture: Secondary | ICD-10-CM | POA: Diagnosis not present

## 2019-01-20 DIAGNOSIS — I2510 Atherosclerotic heart disease of native coronary artery without angina pectoris: Secondary | ICD-10-CM | POA: Diagnosis not present

## 2019-01-20 DIAGNOSIS — I10 Essential (primary) hypertension: Secondary | ICD-10-CM | POA: Diagnosis not present

## 2019-01-20 DIAGNOSIS — I712 Thoracic aortic aneurysm, without rupture: Secondary | ICD-10-CM | POA: Diagnosis not present

## 2019-02-17 ENCOUNTER — Encounter (INDEPENDENT_AMBULATORY_CARE_PROVIDER_SITE_OTHER): Payer: Self-pay

## 2019-02-17 ENCOUNTER — Ambulatory Visit (INDEPENDENT_AMBULATORY_CARE_PROVIDER_SITE_OTHER): Payer: Self-pay | Admitting: Vascular Surgery

## 2019-03-09 ENCOUNTER — Encounter: Payer: Self-pay | Admitting: Cardiology

## 2019-03-09 ENCOUNTER — Other Ambulatory Visit: Payer: Self-pay

## 2019-03-09 ENCOUNTER — Ambulatory Visit (INDEPENDENT_AMBULATORY_CARE_PROVIDER_SITE_OTHER): Payer: Medicare Other | Admitting: Cardiology

## 2019-03-09 VITALS — BP 142/88 | HR 72 | Temp 98.2°F | Ht 61.0 in | Wt 127.0 lb

## 2019-03-09 DIAGNOSIS — I739 Peripheral vascular disease, unspecified: Secondary | ICD-10-CM

## 2019-03-09 DIAGNOSIS — I7101 Dissection of thoracic aorta: Secondary | ICD-10-CM | POA: Diagnosis not present

## 2019-03-09 DIAGNOSIS — I252 Old myocardial infarction: Secondary | ICD-10-CM | POA: Diagnosis not present

## 2019-03-09 DIAGNOSIS — I10 Essential (primary) hypertension: Secondary | ICD-10-CM | POA: Diagnosis not present

## 2019-03-09 DIAGNOSIS — I714 Abdominal aortic aneurysm, without rupture, unspecified: Secondary | ICD-10-CM

## 2019-03-09 DIAGNOSIS — I2510 Atherosclerotic heart disease of native coronary artery without angina pectoris: Secondary | ICD-10-CM

## 2019-03-09 DIAGNOSIS — I71019 Dissection of thoracic aorta, unspecified: Secondary | ICD-10-CM

## 2019-03-09 MED ORDER — HYDRALAZINE HCL 25 MG PO TABS: 25.0000 mg | ORAL_TABLET | Freq: Two times a day (BID) | ORAL | 3 refills | Status: DC

## 2019-03-09 NOTE — Assessment & Plan Note (Signed)
4V CAD at cath Oct 2019.  After review she was not felt to be a candidate for PCI or CABG- medical Rx recommended, EF 50-55%

## 2019-03-09 NOTE — Progress Notes (Signed)
Cardiology Office Note:    Date:  03/09/2019   ID:  [name], Edge 1940/10/15, MRN [id number]  PCP:  Elizabeth Du, MD  Cardiologist:  Elizabeth Burow, MD  Electrophysiologist:  None   Referring MD: Elizabeth Du, MD   Chief Complaint  Patient presents with  . Follow-up    6 months.    History of Present Illness:    Elizabeth [name] is a 78 y.o. female with a hx of a NSTEMI in October 2019.  Catheterization revealed 4 vessel coronary disease.  After evaluation it was felt that she was not a candidate for CABG or PCI because of poor targets.  She had normal ejection fraction.  The plan is for medical therapy.  She also has a documented thoracic aortic dissection, and abdominal aortic aneurysm of 4.0 cm, and 80 to 99% left internal carotid artery narrowing by Doppler.  She is being followed by Elizabeth [name].  CT scan recently was stable.  She is in the office today accompanied by her son for routine follow-up.  The patient denies any unusual chest pain.  She says her blood pressure at home frequently runs over 1 40-1 50.  She has a prescription for hydralazine 25 mg as needed for systolic greater than 762.  Blood pressure by me in the office was 831 systolic over 68.  Past Medical History:  Diagnosis Date  . AAA (abdominal aortic aneurysm) ([place])   . Hypertension   . Peripheral vascular disease [facility])     Past Surgical History:  Procedure Laterality Date  . APPENDECTOMY    . LEFT HEART CATH AND CORONARY ANGIOGRAPHY N/A 05/19/2018   Procedure: LEFT HEART CATH AND CORONARY ANGIOGRAPHY;  Surgeon: Belva Crome, MD;  Location: [place] CV LAB;  Service: Cardiovascular;  Laterality: N/A;  . PERIPHERAL VASCULAR CATHETERIZATION Right 02/02/2016   Procedure: Lower Extremity Angiography;  Surgeon: Algernon Huxley, MD;  Location: [place] CV [facility];  Service: Cardiovascular;  Laterality: Right;    Current Medications: No outpatient medications have been marked as taking for the 03/09/19  encounter (Office Visit) with Elizabeth Quan, PA-C.     Allergies:   Codeine and Sulfa antibiotics   Social History   Socioeconomic History  . Marital status: Widowed    Spouse name: Not on file  . Number of children: Not on file  . Years of education: Not on file  . Highest education level: Not on file  Occupational History  . Not on file  Social Needs  . Financial resource strain: Not on file  . Food insecurity    Worry: Not on file    Inability: Not on file  . Transportation needs    Medical: Not on file    Non-medical: Not on file  Tobacco Use  . Smoking status: Former Smoker    Quit date: 02/02/1999    Years since quitting: 20.1  . Smokeless tobacco: Never Used  Substance and Sexual Activity  . Alcohol use: No  . Drug use: No  . Sexual activity: Not Currently  Lifestyle  . Physical activity    Days per week: Not on file    Minutes per session: Not on file  . Stress: Not on file  Relationships  . Social Herbalist on phone: Not on file    Gets together: Not on file    Attends religious service: Not on file    Active member of club or organization: Not on file  Attends meetings of clubs or organizations: Not on file    Relationship status: Not on file  Other Topics Concern  . Not on file  Social History Narrative  . Not on file     Family History: The patient's family history includes Hypertension in her father and mother; Stroke in her father and mother.  ROS:   Please see the history of present illness.     All other systems reviewed and are negative.  EKGs/Labs/Other Studies Reviewed:    The following studies were reviewed today: CTA 12/19/2018 Cath 05/19/2018 CA dopplers 05/20/2019   EKG:  EKG is ordered today.  The ekg ordered today demonstrates NSR, NSST changes  Recent Labs: 05/18/2018: ALT 15 05/20/2018: Magnesium 1.9 05/28/2018: Hemoglobin 10.6; Platelets 345 10/27/2018: BUN 18; Creatinine, Ser 1.10; Potassium 4.6; Sodium  142  Recent Lipid Panel    Component Value Date/Time   CHOL 99 05/20/2018 0249   TRIG 152 (H) 05/27/2018 0428   HDL 28 (L) 05/20/2018 0249   CHOLHDL 3.5 05/20/2018 0249   VLDL 28 05/20/2018 0249   LDLCALC 43 05/20/2018 0249    Physical Exam:    VS:  BP (!) 142/88 (BP Location: Right Arm, Patient Position: Sitting, Cuff Size: Normal)   Pulse 72   Temp 98.2 F (36.8 C)   Ht 5\' 1"  (1.549 m)   Wt 127 lb (57.6 kg)   BMI 24.00 kg/m     Wt Readings from Last 3 Encounters:  03/09/19 127 lb (57.6 kg)  12/26/18 125 lb 9.6 oz (57 kg)  10/14/18 127 lb 6.4 oz (57.8 kg)     GEN:  Well nourished, well developed in no acute distress HEENT: Normal NECK: No JVD; bilateral CA bruits, RCE scar LYMPHATICS: No lymphadenopathy CARDIAC: RRR,  2/6 systolic murmur AOV area, no rubs, gallops RESPIRATORY:  Scoliosis, Clear to auscultation without rales, wheezing or rhonchi  ABDOMEN: Soft, non-tender, non-distended MUSCULOSKELETAL:  No edema; No deformity  SKIN: Warm and dry. Both radial pulses diminished.  She tells me she has to have B/P taken in her Rt arm- ? Lt SCA stenosis- no bruit on exam NEUROLOGIC:  Alert and oriented x 3 PSYCHIATRIC:  Normal affect   ASSESSMENT:    CAD-without angina  4V CAD at cath Oct 2019.  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 Dr Randie Heinzain follows  Essential hypertension Poor control- increase hydralazine to 25 mg BID (not PRN)  History of NSTEMI October 2019- cath severe 3V CAD, EF 50-55%- medical Rx  PLAN:    Increase Hydralazine to 25 mg BID.  She has a f/u with her PCP next month.  She would [facility] as [facility] easier for her son. She may have Lt SCA stenosis but she is  not symptomatic so I didn't pursue this. Will try and keep systolic B/P less than 140.   Medication Adjustments/Labs and Tests Ordered: Current medicines are reviewed at length with the patient today.  Concerns regarding medicines are outlined above.  Orders Placed This Encounter  Procedures  . EKG 12-Lead   Meds ordered this encounter  Medications  . hydrALAZINE (APRESOLINE) 25 MG tablet    Sig: Take 1 tablet (25 mg total) by mouth 2 (two) times daily.  Dispense:  60 tablet    Refill:  3    NEW INSTRUCTIONS    Patient Instructions  Medication Instructions:  START Hydralazine take 1 tablet twice a day  If you need a refill on your cardiac medications before your next appointment, please call your pharmacy.   Lab work: None   If you have labs (blood work) drawn today and your tests are completely normal, you will receive your results only by: [name]. MyChart Message (if you have MyChart) OR . A paper copy in the mail If you have any lab test that is abnormal or we need to change your treatment, we will call you to review the results.  Testing/Procedures: None   Follow-Up: At [facility], you and your health needs are our priority.  As part of our continuing mission to provide you with exceptional heart care, we have created designated Provider Care Teams.  These Care Teams include your primary Cardiologist (physician) and Advanced Practice Providers (APPs -  Physician Assistants and Nurse Practitioners) who all work together to provide you with the care you need, when you need it. You will need a follow up appointment in 2 months.  Please call our office 2 months in advance to schedule this appointment.  You may see Dr Kirke CorinArida or one of the following Advanced Practice Providers on your designated Care Team:   Nicolasa Duckinghristopher Berge, NP Eula Listenyan Dunn, PA-C . Marisue IvanJacquelyn Visser, PA-C  Any Other Special Instructions Will Be Listed Below (If Applicable).      Signed, Corine ShelterLuke Khamari Sheehan, PA-C   03/09/2019 2:24 PM    [facility]

## 2019-03-09 NOTE — Assessment & Plan Note (Signed)
Penetrating thoracic aortic hematoma - followed by Dr Donzetta Matters- B/P control primary treatment

## 2019-03-09 NOTE — Assessment & Plan Note (Signed)
October 2019- cath severe 3V CAD, EF 50-55%- medical Rx

## 2019-03-09 NOTE — Assessment & Plan Note (Signed)
Poor control- increase hydralazine to 25 mg BID (not PRN)

## 2019-03-09 NOTE — Assessment & Plan Note (Signed)
Small abdominal aortic aneurysm-4.3 cm. Dr Donzetta Matters follows

## 2019-03-09 NOTE — Patient Instructions (Signed)
Medication Instructions:  START Hydralazine take 1 tablet twice a day  If you need a refill on your cardiac medications before your next appointment, please call your pharmacy.   Lab work: None   If you have labs (blood work) drawn today and your tests are completely normal, you will receive your results only by: [name] MyChart Message (if you have MyChart) OR . A paper copy in the mail If you have any lab test that is abnormal or we need to change your treatment, we will call you to review the results.  Testing/Procedures: None   Follow-Up: At [facility], you and your health needs are our priority.  As part of our continuing mission to provide you with exceptional heart care, we have created designated Provider Care Teams.  These Care Teams include your primary Cardiologist (physician) and Advanced Practice Providers (APPs -  Physician Assistants and Nurse Practitioners) who all work together to provide you with the care you need, when you need it. You will need a follow up appointment in 2 months.  Please call our office 2 months in advance to schedule this appointment.  You may see Dr Fletcher Anon or one of the following Advanced Practice Providers on your designated Care Team:   Murray Hodgkins, NP Christell Faith, PA-C . Marrianne Mood, PA-C  Any Other Special Instructions Will Be Listed Below (If Applicable).

## 2019-04-22 DIAGNOSIS — Z0000 Encounter for general adult medical examination without abnormal findings: Secondary | ICD-10-CM | POA: Diagnosis not present

## 2019-04-22 DIAGNOSIS — Z23 Encounter for immunization: Secondary | ICD-10-CM | POA: Diagnosis not present

## 2019-04-29 ENCOUNTER — Encounter: Payer: Self-pay | Admitting: Physician Assistant

## 2019-04-29 DIAGNOSIS — I714 Abdominal aortic aneurysm, without rupture: Secondary | ICD-10-CM | POA: Diagnosis not present

## 2019-04-29 DIAGNOSIS — E782 Mixed hyperlipidemia: Secondary | ICD-10-CM | POA: Diagnosis not present

## 2019-04-29 DIAGNOSIS — I739 Peripheral vascular disease, unspecified: Secondary | ICD-10-CM | POA: Diagnosis not present

## 2019-04-29 DIAGNOSIS — I10 Essential (primary) hypertension: Secondary | ICD-10-CM | POA: Diagnosis not present

## 2019-04-30 ENCOUNTER — Other Ambulatory Visit: Payer: Self-pay | Admitting: Cardiovascular Disease

## 2019-05-12 ENCOUNTER — Ambulatory Visit: Payer: Medicare Other | Admitting: Cardiovascular Disease

## 2019-05-20 ENCOUNTER — Ambulatory Visit (INDEPENDENT_AMBULATORY_CARE_PROVIDER_SITE_OTHER): Payer: Medicare Other | Admitting: Physician Assistant

## 2019-05-20 ENCOUNTER — Other Ambulatory Visit: Payer: Self-pay

## 2019-05-20 ENCOUNTER — Encounter: Payer: Self-pay | Admitting: Nurse Practitioner

## 2019-05-20 VITALS — BP 170/100 | HR 75 | Ht 61.0 in | Wt 127.2 lb

## 2019-05-20 DIAGNOSIS — I714 Abdominal aortic aneurysm, without rupture, unspecified: Secondary | ICD-10-CM

## 2019-05-20 DIAGNOSIS — I739 Peripheral vascular disease, unspecified: Secondary | ICD-10-CM

## 2019-05-20 DIAGNOSIS — I774 Celiac artery compression syndrome: Secondary | ICD-10-CM

## 2019-05-20 DIAGNOSIS — I2510 Atherosclerotic heart disease of native coronary artery without angina pectoris: Secondary | ICD-10-CM

## 2019-05-20 DIAGNOSIS — I771 Stricture of artery: Secondary | ICD-10-CM

## 2019-05-20 DIAGNOSIS — E785 Hyperlipidemia, unspecified: Secondary | ICD-10-CM

## 2019-05-20 DIAGNOSIS — I6523 Occlusion and stenosis of bilateral carotid arteries: Secondary | ICD-10-CM

## 2019-05-20 DIAGNOSIS — I340 Nonrheumatic mitral (valve) insufficiency: Secondary | ICD-10-CM

## 2019-05-20 DIAGNOSIS — I10 Essential (primary) hypertension: Secondary | ICD-10-CM | POA: Diagnosis not present

## 2019-05-20 DIAGNOSIS — I719 Aortic aneurysm of unspecified site, without rupture: Secondary | ICD-10-CM

## 2019-05-20 DIAGNOSIS — I701 Atherosclerosis of renal artery: Secondary | ICD-10-CM

## 2019-05-20 MED ORDER — HYDRALAZINE HCL 50 MG PO TABS: 25.0000 mg | ORAL_TABLET | Freq: Three times a day (TID) | ORAL | 3 refills | Status: DC

## 2019-05-20 MED ORDER — HYDRALAZINE HCL 50 MG PO TABS: 50.0000 mg | ORAL_TABLET | Freq: Three times a day (TID) | ORAL | 3 refills | Status: DC

## 2019-05-20 NOTE — Progress Notes (Signed)
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  Past Medical History    Past Medical History:  Diagnosis Date   3-vessel CAD    05/2018 LHC with a single remaining conduit which is the left main coronary artery supplying the LAD.  Both the circumflex and right coronary artery were occluded.  The distal left main, proximal LAD is aneurysmal and heavily calcified.  The LAD supplies well-formed collaterals to the PDA.  Not felt to be candidate for revascularization and with recommendation for medical mgmt   AAA (abdominal aortic aneurysm) (HCC)    4.3cm (12/2018)   Degenerative joint disease of left hip    Heart failure with preserved ejection fraction (HCC)    EF 60-65%, 05/2018   History of non-ST elevation myocardial infarction (NSTEMI)    05/2018 with subsequent LHC and in the setting of acute aortic ulcer and hematoma   Hyperlipidemia    Hypertension    Internal carotid artery stenosis, left    80-99% 05/2018   Intramural hematoma of thoracic aorta (HCC)    05/2018   Mitral regurgitation    Mild by 05/2018 echo   Peripheral vascular disease (HCC)    extensive vascular dz s/p b/l carotid endarterectomies (2014) and iliac stenting, AAA, aortic ulcer   Smoking hx    Quit ~1992   Status post bilateral carotid endarterectomy    Followed by Vascular surgery with most recent images 05/2018 showing L ICA 80-99% stenosis   Past Surgical History:  Procedure Laterality Date   APPENDECTOMY     CARDIAC CATHETERIZATION     LEFT HEART CATH AND CORONARY ANGIOGRAPHY N/A 05/19/2018   Procedure: LEFT HEART CATH AND CORONARY ANGIOGRAPHY;  Surgeon: Lyn Records, MD;  Location: [facility] INVASIVE CV [facility];  Service: Cardiovascular;  Laterality: N/A;   PERIPHERAL VASCULAR CATHETERIZATION Right 02/02/2016   Procedure: Lower Extremity Angiography;  Surgeon: Annice Needy, MD;  Location: [facility] INVASIVE CV LAB;  Service: Cardiovascular;  Laterality: Right;    Allergies  Allergies  Allergen Reactions   Codeine Other (See Comments)    Reaction: Unsure   Sulfa Antibiotics Other (See Comments)    Mouth blisters    History of Present Illness    78 year old female with history of poorly controlled hypertension, hyperlipidemia, and with prior history of smoking (quit 1992).  She had a normal stress test in 2013 and prior to her left common iliac artery stent.    She has a known history of extensive vascular disease.  She is s/p bilateral carotid endarterectomies in 2014 by Dr. Wyn Quaker with 05/2018 studies showing LICA with 80-99% stenosis. She has known PVD and s/p iliac stenting. She has an abdominal aortic aneurysm at approximately 4.3 cm by most  recent CT 12/2018 and followed by Dr. Donzetta Matters. 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 Echo showed LVEF of 60 to 65% with hypokinesis of mid-apical inferolateral myocardium and grade 1 diastolic dysfunction. LHC (copied below) showed diffuse coronary artery disease with 60% LM, 80% pLAD, 70% mLAD, occluded LCx at the  ostium, and occluded RCA which filled via LAD collaterals. She was not thought to be a PCI/CABG interventional candidate at that time with medical therapy recommended.  It was noted that, if she were to develop angina in the future, high risk PCI could be considered at that time. During this 05/2018 admission, it was noted that she had 80 to 70% LICA stenosis, high-grade L SCA stenosis, high-grade right RAS, high-grade celiac stenosis, severe left hip DJD, and a run of PSVT treated with increased beta-blocker.  Follow-up CT 05/27/2018 showed stable appearance of her aneurysm. She was seen following discharge and felt to be doing well.  Recommendation was for blood pressure management given her extensive vascular dz.  She was on as needed hydralazine 25 mg with recommendation to change to BID dosing as blood pressure at that time was 172/68 with her reporting home  SBP greater than 140-150.  Her left SCA stenosis was asymptomatic.  She indicated that she would like cardiac care transferred to [facility], given this was easier on her son.   Today, 05/20/2019, she presents for her 81-month follow-up in [place] and to establish care.  She denies any concerning / recent symptoms of chest pain, shortness of breath, racing heart rate, or palpitations since her last visit.  No presyncope or syncope reported.  No visual changes/amaurosis fugax. No recent falls. She reports ongoing left-sided pain due to a childhood injury with known left DJD D as above.  Due to this pain, she continued to live a sedentary lifestyle.  She checks her blood pressure 3 times a day at home with proper technique.  She has not changed to twice daily dosing of her hydralazine, as recommended at her previous appointment 03/2019. She continues taking her blood pressure medication on a PRN basis and only when her SBP is above 140.  When questioned further about this as needed dosing, given her previous clinic recommendation to change to twice daily  dosing, she indicated that she has been hesitant to change to twice daily dosing due to occasionally getting SBP in the 110s.  She denied any symptoms with SBP in the 110s, including presyncope/dizziness/visual changes.  She reported that she only notes SBP in the 110s on a rare occasion and does not take any hydralazine at that time.  Blood pressure today in clinic is elevated at 170/100 with recommendation that patient take hydralazine 25 mg at the time of the appointment.  She did note that she has been under more stress than usual, as her son is currently in the middle of a divorce from his wife, which has affected her emotionally.  Otherwise, she reported medication compliance.  No signs or symptoms consistent with bleeding on dual antiplatelet therapy including melena, BRBPR, or hematochezia.  Home Medications    Prior to Admission medications   Medication Sig Start Date End Date Taking? Authorizing Provider  amLODipine (NORVASC) 10 MG tablet Take 10 mg by mouth daily. 01/04/16  Yes [provider]  aspirin EC 81 MG EC tablet Take 1 tablet (81 mg total) by mouth daily. Patient  taking differently: Take 81 mg by mouth daily. Does not take if she has taken Goody HA powder 02/03/16  Yes Stegmayer, Ranae Plumber, PA-C  Aspirin-Acetaminophen-Caffeine (GOODY HEADACHE PO) Take 1 packet by mouth daily as needed (headache).   Yes [provider]  cholecalciferol (VITAMIN D) 1000 units tablet Take 1,000 Units by mouth daily.   Yes [provider]  clopidogrel (PLAVIX) 75 MG tablet Take 75 mg by mouth daily. 01/04/16  Yes [provider]  Cyanocobalamin (VITAMIN B-12 PO) Take 1 tablet by mouth daily.   Yes [provider]  hydrALAZINE (APRESOLINE) 25 MG tablet Take 1 tablet (25 mg total) by mouth 2 (two) times daily. 03/09/19  Yes Kilroy, Eda Paschal, PA-C  HYDROcodone-acetaminophen (NORCO) 10-325 MG tablet Take 1 tablet by mouth every 6 (six) hours as needed. 01/24/16  Yes  [provider]  isosorbide mononitrate (IMDUR) 30 MG 24 hr tablet Take 0.5 tablets (15 mg total) by mouth daily. 05/29/18 10/14/19 Yes Zigmund Daniel., MD  lisinopril (ZESTRIL) 40 MG tablet TAKE 1 TABLET BY MOUTH EVERY DAY 04/30/19  Yes Runell Gess, MD  metoprolol tartrate (LOPRESSOR) 50 MG tablet Take 2 tablets (100 mg total) by mouth 2 (two) times daily. 05/28/18 10/14/19 Yes Zigmund Daniel., MD  Multiple Vitamin (MULTIVITAMIN) tablet Take 1 tablet by mouth daily.   Yes [provider]  nitroGLYCERIN (NITROSTAT) 0.4 MG SL tablet Place 1 tablet (0.4 mg total) under the tongue every 5 (five) minutes as needed for chest pain. 06/05/18 10/14/19 Yes Kilroy, Luke K, PA-C  polyethylene glycol (MIRALAX / GLYCOLAX) packet Take 17 g by mouth daily. 05/29/18  Yes Zigmund Daniel., MD  potassium citrate (UROCIT-K) 10 MEQ (1080 MG) SR tablet Take 10 mEq by mouth 2 (two) times daily. 01/04/16  Yes [provider]  rosuvastatin (CRESTOR) 20 MG tablet Take 20 mg by mouth daily. 01/15/18  Yes [provider]  potassium chloride (K-DUR) 10 MEQ tablet Take 10 mEq by mouth 2 (two) times daily. 11/30/18   [provider]    Review of Systems    She denies chest pain, palpitations, dyspnea, pnd, orthopnea, n, v, dizziness, syncope, edema, weight gain, or early satiety.  No amaurosis fugax. All other systems reviewed and are otherwise negative except as noted above.  Physical Exam    VS:  BP (!) 170/100 (BP Location: Right Arm, Patient Position: Sitting, Cuff Size: Normal)    Pulse 75    Ht  (1.549 m)    Wt 127 lb 4 oz (57.7 kg)    SpO2 96%    BMI 24.04 kg/m  , BMI Body mass index is 24.04 kg/m. GEN: Elderly female, in no acute distress.  Joined by a friend.  Sitting in the chair (rather than up on the table), due to left DJD. HEENT: normal. Neck: Supple, no JVD. + Bilateral carotid bruits noted with L worse than R. No masses. Cardiac: RRR, 1/6  systolic murmur. No rubs, or gallops. No clubbing, cyanosis, edema.  Radials /DP/PT 2+ and equal bilaterally.  No appreciated L subsclavian bruit. Respiratory:  Respirations regular and unlabored, clear to auscultation bilaterally. GI: Soft, nontender, nondistended, BS + x 4. MS: no deformity or atrophy. Skin: warm and dry, no rash. Neuro:  Strength and sensation are intact. Psych: Normal affect.  Accessory Clinical Findings    ECG personally reviewed by me today -NSR, 70 bpm, poor conduction in lead II, aVL, nonspecific ST/T changes- no  acute changes from previous- no acute changes.  05/2018 LHC  Total occlusion of the mid to distal RCA.  Distal RCA fills via well-formed collaterals around the left ventricular apex from the LAD.  Total occlusion of a relatively small circumflex.  Minimal collaterals are noted.  Ostial 60% left main with pressure damping noted during engagement.  At least 60% distal left main is noted.  Heavily calcified 80% proximal LAD followed by eccentric calcified slitlike 70% mid LAD stenosis.  Normal LV function with EF 55%.  The patient has a single remaining conduit which is the left main coronary artery supplying the LAD.  Both the circumflex and right coronary are occluded.  The distal left main, proximal LAD is aneurysmal and heavily calcified.  The LAD supplies well-formed collaterals to the PDA.  Recommend evaluation for potential surgical revascularization.  No appealing interventional options exist.  Carotid US 05/19/2018  Summary: Right Carotid: There is no evidence of stenosis in the right ICA. Patent CEA.  Left Carotid: Velocities in the left ICA are consistent with a 80-99% stenosis.               Significant acoustic shadowing and the bifurcation.  Vertebrals:  Bilateral vertebral arteries demonstrate antegrade flow. Subclavians: Normal flow hemodynamics were seen in bilateral subclavian              arteries. *See table(s) above for  measurements and observations.  Echo 05/20/18: Study Conclusions - Left ventricle: The cavity size was normal. Wall thickness was normal. Systolic function was normal. The estimated ejection fraction was in the range of 60% to 65%. Hypokinesis of the mid-apicalinferolateral myocardium; consistent with ischemia in the distribution of the right coronary or left circumflex coronary artery. Doppler parameters are consistent with abnormal left ventricular relaxation (grade 1 diastolic dysfunction). - Mitral valve: There was mild regurgitation.  10/27/2018 Sodium 142, potassium 4.6, creatinine 1.10, BUN 18. 05/2018 Hgb 10.6 No previous A1C / TSH. 05/2018 LDL 43  Assessment & Plan    Hypertension, poorly controlled --BP 178/100 with patient reporting that she took her a.m. hydralazine 25 mg.  As above, previous clinic visit with notes indicating recommendation to change from PRN to BID dosing of hydralazine.  Patient has continued as needed dosing since her last visit and only takes her hydralazine if SBP over 140.   --Established new goal SBP under 120 (previous goal SBP 140), preferably 110-120. Lower BP goal set given her vascular disease with abdominal aortic aneurysm of 4.3 cm x 12/2018 CT and recommendation by vascular surgery for strict BP control.  No symptoms reported by patient when SBP in the 110s.  Reviewed the proper technique for blood pressure checks.  Instructed patient to repeat her BP check if BP low 110s before her hydralazine to ensure accurate. She will bring her BP cuff into the office to ensure its accuracy. If symptomatic with low BP, call the office.  --Increase hydralazine to 50 mg and 3 times daily.  Discussed the importance of 3 times daily dosing with patient at length to avoid rebound hypertension which can occur with missed doses of hydralazine / PRN dosing.   Due to the importance of controlling her blood pressure with current vascular dz, she will record her  BP over the next week and call the office with these measurements with adjustment of dosing if needed at that time. Continue with amlodipine, metoprolol, lisinopril, Imdur. Follow-up in 1 month.  4v CAD, 05/2018 NSTEMI --No chest pain, SOB/DOE. 05/2018 echo  EF 60-65%, G1DD, and hypokinesis of the mid apical inferolateral myocardium, consistent with ischemia in the distribution of the right coronary artery or left circumflex coronary artery. LHC 05/2018 with recommendation for medical management only as patient was not felt to be a candidate for PCI/CABG.  It was noted that, if she developed angina, high risk PCI could be considered at that time. Continued risk factor modification recommended.  Future recommendations include updating echo as below.  Continue medical management with DAPT, amlodipine, Imdur, hydralazine, lisinopril, metoprolol, statin, potassium, and as needed sublingual nitro.  No s/sx reported of bleeding, including melena, on DAPT.   HLD --Continue statin therapy.  05/2018 LDL 43.  AAA (4.3cm by 12/2018 CT) --Risk factor modification recommended with better blood pressure control. Followed by vascular surgery.   S/p Bilateral carotid endarterectomies (2014) --Bilateral bruits heard on exam today with left bruit worse than that of right.  S/p 2014 bilateral endarterectomies with last ultrasound as above 05/2018 and showing left internal carotid with 80 to 99% stenosis.  Patient denies any symptoms, including presyncope, syncope, and amaurosis fugax. Followed by vascular surgery. Risk factor modification recommended with better blood pressure control.  PVD, Vascular dz -- S/p L common iliac stenting. 05/2018 imaging with high-grade L SCA stenosis, high-grade right RAS, high-grade celiac stenosis.  S/p 2019 thoracic aorta ulcer and hematoma with recommendation for strict BP control.  Followed by vascular surgery.  Risk factor modification recommended with better blood pressure  control.  Mild MR --Recommend continue to monitor with periodic echo or symptoms concerning for worsening valvular dz. No reported recent SOB, presyncope, DOE. Consider updating 05/2018 echo at follow-up.   *Over 50 minutes spent discussing patient's h/o blood pressure control and past medication / treatment plans, CAD and medical management, and extensive vascular disease and monitoring / risk factor control with BP control as patient is establishing care with the [facility] office with complex cardiac and vascular history.  Disposition: Increase hydralazine to 50 mg 3 times daily.  Monitor home BP and call in 1 week with titrations to medications if needed at that time. F/u with Dr. Azucena Cecil in 1 month. Consider updating echo at future follow-ups.  Lennon Alstrom, PA-C 05/20/2019, 6:17 PM

## 2019-05-20 NOTE — Patient Instructions (Signed)
Medication Instructions:  1- INCREASE Hydralazine Take 1 tablet (50 mg total) by mouth 3 (three) times daily If you need a refill on your cardiac medications before your next appointment, please call your pharmacy.   Lab work: None ordered  If you have labs (blood work) drawn today and your tests are completely normal, you will receive your results only by: [name] MyChart Message (if you have MyChart) OR . A paper copy in the mail If you have any lab test that is abnormal or we need to change your treatment, we will call you to review the results.  Testing/Procedures: None ordered   Follow-Up: At [facility], you and your health needs are our priority.  As part of our continuing mission to provide you with exceptional heart care, we have created designated Provider Care Teams.  These Care Teams include your primary Cardiologist (physician) and Advanced Practice Providers (APPs -  Physician Assistants and Nurse Practitioners) who all work together to provide you with the care you need, when you need it. You will need a follow up appointment in 1 months. You may see Dr. Garen Lah or one of the following Advanced Practice Providers on your designated Care Team:   Murray Hodgkins, NP Christell Faith, PA-C . Marrianne Mood, PA-C  Any Other Special Instructions Will Be Listed Below (If Applicable). 1- Please set up primary care physician   You may call [facility] @ [phone] for a list of primary care providers in your area Or visit their website [email or web address] Please have any insurance card available before calling or going online.   2- Call the clinic in 1 week with BP readings.  How to use a home blood pressure monitor. . Be still. Don't smoke, drink caffeinated beverages or exercise within 30 minutes before measuring your blood pressure. . Sit correctly. Sit with your back straight and supported (on a dining chair, rather than a sofa). Your feet should be  flat on the floor and your legs should not be crossed. Your arm should be supported on a flat surface (such as a table) with the upper arm at heart level. Make sure the bottom of the cuff is placed directly above the bend of the elbow.  . Measure at the same time every day. It's important to take the readings at the same time each day, such as morning and evening. Take reading approximately 1 hour after BP medications.

## 2019-06-03 ENCOUNTER — Telehealth: Payer: Self-pay | Admitting: Cardiology

## 2019-06-03 NOTE — Telephone Encounter (Signed)
Patient calling with BP readings requested from last appt on 10/14 Taken within the last week 137/75 127/78 132/66 138/78 Sunday 10/25 162/84 This morning 10/28 133/73 Please call to discuss

## 2019-06-08 NOTE — Telephone Encounter (Signed)
Improved BP readings with the exception of Sunday; however, ideally, we would like to see her BP below SBP 130s. Please clarify when these readings are taking place (AM/PM) and with respect to the timing of her hydralazine TID / BP meds / physical activity. Can certainly reach out to her this week if she has questions. Continue to recommend she bring in her BP cuff as well.

## 2019-06-09 NOTE — Telephone Encounter (Signed)
Pt reports that BP readings are around 11 AM. Medication is generally taken around 7:30 AM.  I let patient know that I would call her with any updates from Elenor Quinones, PA.

## 2019-06-10 ENCOUNTER — Other Ambulatory Visit: Payer: Self-pay | Admitting: Cardiology

## 2019-06-16 ENCOUNTER — Ambulatory Visit (INDEPENDENT_AMBULATORY_CARE_PROVIDER_SITE_OTHER): Payer: Self-pay | Admitting: Family Medicine

## 2019-06-16 ENCOUNTER — Encounter: Payer: Self-pay | Admitting: Family Medicine

## 2019-06-16 ENCOUNTER — Ambulatory Visit: Payer: Medicare Other | Admitting: Student in an Organized Health Care Education/Training Program

## 2019-06-16 ENCOUNTER — Other Ambulatory Visit: Payer: Self-pay

## 2019-06-16 VITALS — BP 182/98 | HR 60 | Temp 97.8°F | Resp 14 | Ht 61.0 in | Wt 128.7 lb

## 2019-06-16 DIAGNOSIS — I708 Atherosclerosis of other arteries: Secondary | ICD-10-CM

## 2019-06-16 DIAGNOSIS — I739 Peripheral vascular disease, unspecified: Secondary | ICD-10-CM

## 2019-06-16 DIAGNOSIS — G8929 Other chronic pain: Secondary | ICD-10-CM

## 2019-06-16 DIAGNOSIS — I714 Abdominal aortic aneurysm, without rupture, unspecified: Secondary | ICD-10-CM

## 2019-06-16 DIAGNOSIS — Z7689 Persons encountering health services in other specified circumstances: Secondary | ICD-10-CM

## 2019-06-16 DIAGNOSIS — E785 Hyperlipidemia, unspecified: Secondary | ICD-10-CM

## 2019-06-16 DIAGNOSIS — Z87828 Personal history of other (healed) physical injury and trauma: Secondary | ICD-10-CM

## 2019-06-16 DIAGNOSIS — I214 Non-ST elevation (NSTEMI) myocardial infarction: Secondary | ICD-10-CM

## 2019-06-16 DIAGNOSIS — I10 Essential (primary) hypertension: Secondary | ICD-10-CM

## 2019-06-16 DIAGNOSIS — Z0289 Encounter for other administrative examinations: Secondary | ICD-10-CM

## 2019-06-16 DIAGNOSIS — I25118 Atherosclerotic heart disease of native coronary artery with other forms of angina pectoris: Secondary | ICD-10-CM

## 2019-06-16 DIAGNOSIS — I7103 Dissection of thoracoabdominal aorta: Secondary | ICD-10-CM

## 2019-06-16 DIAGNOSIS — M25552 Pain in left hip: Secondary | ICD-10-CM

## 2019-06-16 DIAGNOSIS — M5442 Lumbago with sciatica, left side: Secondary | ICD-10-CM

## 2019-06-16 MED ORDER — ISOSORBIDE MONONITRATE ER 30 MG PO TB24: 15.0000 mg | ORAL_TABLET | Freq: Every day | ORAL | 5 refills | Status: DC

## 2019-06-16 MED ORDER — CLOPIDOGREL BISULFATE 75 MG PO TABS: 75.0000 mg | ORAL_TABLET | Freq: Every day | ORAL | 12 refills | Status: DC

## 2019-06-16 MED ORDER — AMLODIPINE BESYLATE 10 MG PO TABS: 10.0000 mg | ORAL_TABLET | Freq: Every day | ORAL | 1 refills | Status: DC

## 2019-06-16 MED ORDER — METOPROLOL TARTRATE 100 MG PO TABS: 100.0000 mg | ORAL_TABLET | Freq: Two times a day (BID) | ORAL | 3 refills | Status: DC

## 2019-06-16 MED ORDER — ROSUVASTATIN CALCIUM 20 MG PO TABS: 20.0000 mg | ORAL_TABLET | Freq: Every day | ORAL | 1 refills | Status: DC

## 2019-06-16 MED ORDER — HYDRALAZINE HCL 50 MG PO TABS: 50.0000 mg | ORAL_TABLET | Freq: Three times a day (TID) | ORAL | 1 refills | Status: DC | PRN

## 2019-06-16 MED ORDER — LISINOPRIL 40 MG PO TABS: 40.0000 mg | ORAL_TABLET | Freq: Every day | ORAL | 1 refills | Status: DC

## 2019-06-16 MED ORDER — HYDROCODONE-ACETAMINOPHEN 10-325 MG PO TABS: 1.0000 | ORAL_TABLET | ORAL | 0 refills | Status: DC | PRN
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
## 2019-06-16 NOTE — Telephone Encounter (Signed)
Left detailed message on machine with POC from [place].   No further orders at this time.   Encouraged pt to call back with any further questions or concerns.

## 2019-06-16 NOTE — Telephone Encounter (Signed)
Thank you for the update. Please have her continue to document her BP, preferably twice daily. She is scheduled to see Dr. Garen Lah later this week. Please remind her to bring these readings with her and her BP cuff (so we can ensure its accuracy) to this appointment. I will defer any changes in her medications until his appointment on 11/13. Please thank her for calling in with the readings.

## 2019-06-16 NOTE — Patient Instructions (Signed)
Please follow the past directions of your cardiologist and vascular specialist being careful to keep your blood pressure managed  To their specifications.    You should use hydralazine to do this - make sure to be taking three times a day if BP's are elevated  We will have you sign a controlled substance contract with [place] and I am willing to refill your medications until you establish with a pain management specialist.  Your dose is so high that we will need them to try and help manage this.  There are a lot of other medical options with much less risk which will effectively help control your pain so please be patient with the process.

## 2019-06-19 ENCOUNTER — Other Ambulatory Visit: Payer: Self-pay

## 2019-06-19 ENCOUNTER — Encounter: Payer: Self-pay | Admitting: Cardiology

## 2019-06-19 ENCOUNTER — Ambulatory Visit (INDEPENDENT_AMBULATORY_CARE_PROVIDER_SITE_OTHER): Payer: Medicare Other | Admitting: Cardiology

## 2019-06-19 VITALS — BP 174/92 | HR 70 | Ht 61.0 in | Wt 129.0 lb

## 2019-06-19 DIAGNOSIS — I10 Essential (primary) hypertension: Secondary | ICD-10-CM | POA: Diagnosis not present

## 2019-06-19 DIAGNOSIS — I2510 Atherosclerotic heart disease of native coronary artery without angina pectoris: Secondary | ICD-10-CM | POA: Diagnosis not present

## 2019-06-19 MED ORDER — CHLORTHALIDONE 25 MG PO TABS: 25.0000 mg | ORAL_TABLET | Freq: Every day | ORAL | 5 refills | Status: DC

## 2019-06-19 NOTE — Progress Notes (Signed)
Cardiology Office Note:    Date:  06/19/2019   ID:  Elizabeth [name], DOB [birth year], MRN [id number]  PCP:  Danelle Berryapia, Leisa, PA-C  Cardiologist:  Debbe OdeaBrian Agbor-Etang, MD  Electrophysiologist:  None   Referring MD: Kari BaarsHawkins, Edward, MD   Chief Complaint  Patient presents with  . Other    1 month follow up. Patient denies chest pain and SOB. Meds reviewed verbally with Patient.     History of Present Illness:    Elizabeth [name] is a 78 y.o. 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  Patient used to be seen by [facility]. Gery [facility]ry at [facility].  Patient wanted to establish care at the [facility].  In October 2019, she underwent a left heart cath showing multivessel disease but was not seen as a candidate for revascularization.  Medical therapy was recommended.  The plan is if she develops symptoms in the future, high risk PCI may be considered.  After last visit about a month ago, her blood pressure was not well controlled.  She was counseled to increase her hydralazine to 50 mg 3 times daily as needed for blood pressure over 160.  She is being followed by vascular surgery for management of AAA, carotid and peripheral vascular disease.  She denies any symptoms of chest pain or shortness of breath.  Takes all her medications as prescribed.  Past Medical History:  Diagnosis Date  . 3-vessel CAD    05/2018 LHC with a single remaining conduit which is the left main coronary artery supplying the LAD.  Both the circumflex and right coronary artery were occluded.  The distal left main, proximal LAD is aneurysmal and heavily calcified.  The LAD supplies well-formed collaterals to the PDA.  Not felt to be candidate for revascularization and with recommendation for medical mgmt  . AAA (abdominal aortic aneurysm) (HCC)    4.3cm (12/2018)  .  Celiac artery stenosis (HCC)   . Degenerative joint disease of left hip   . Heart failure with preserved ejection fraction (HCC)    EF 60-65%, 05/2018  . History of non-ST elevation myocardial infarction (NSTEMI)    05/2018 with subsequent LHC and in the setting of acute aortic ulcer and hematoma  . Hyperlipidemia   . Hypertension   . Internal carotid artery stenosis, left    80-99% 05/2018  . Intramural hematoma of thoracic aorta (HCC)    05/2018  . Mitral regurgitation    Mild by 05/2018 echo  . Paroxysmal SVT (supraventricular tachycardia) (HCC)    05/2018  . Peripheral vascular disease (HCC)    extensive vascular dz s/p b/l carotid endarterectomies (2014) and iliac stenting, AAA, aortic ulcer  . Right renal artery stenosis (HCC)    05/2018  . Smoking hx    Quit ~6578~1992  . Status post bilateral carotid endarterectomy    Followed by Vascular surgery with most recent images 05/2018 showing L ICA 80-99% stenosis  . Subclavian artery stenosis, left (HCC)    05/2018    Past Surgical History:  Procedure Laterality Date  . APPENDECTOMY    . CARDIAC CATHETERIZATION    . HIP SURGERY Left    x3  . LEFT HEART CATH AND CORONARY ANGIOGRAPHY N/A 05/19/2018   Procedure: LEFT HEART CATH AND CORONARY ANGIOGRAPHY;  Surgeon: Lyn RecordsSmith, Henry W, MD;  Location: [facility] INVAS[facility];  Service: Cardiovascular;  Laterality: N/A;  . PERIPHERAL VASCULAR CATHETERIZATION Right 02/02/2016   Procedure: Lower Extremity Angiography;  Surgeon: Algernon Huxley, MD;  Location: [facility];  Service: Cardiovascular;  Laterality: Right;    Current Medications: No outpatient medications have been marked as taking for the 06/19/19 encounter (Office Visit) with Kate Sable, MD.     Allergies:   Codeine and Sulfa antibiotics   Social History   Socioeconomic History  . Marital status: Widowed    Spouse name: Not on file  . Number of children: 2  . Years of education: Not on file  . Highest education  level: GED or equivalent  Occupational History  . Occupation: diabled  Social Needs  . Financial resource strain: Not hard at all  . Food insecurity    Worry: Never true    Inability: Never true  . Transportation needs    Medical: No    Non-medical: No  Tobacco Use  . Smoking status: Former Smoker    Quit date: 02/02/1999    Years since quitting: 20.3  . Smokeless tobacco: Never Used  Substance and Sexual Activity  . Alcohol use: No  . Drug use: No  . Sexual activity: Not Currently  Lifestyle  . Physical activity    Days per week: 0 days    Minutes per session: 0 min  . Stress: Only a little  Relationships  . Social connections    Talks on phone: More than three times a week    Gets together: More than three times a week    Attends religious service: More than 4 times per year    Active member of club or organization: No    Attends meetings of clubs or organizations: Never    Relationship status: Widowed  Other Topics Concern  . Not on file  Social History Narrative  . Not on file     Family History: The patient's family history includes Heart disease in her son; Hodgkin's lymphoma in her son; Hyperlipidemia in her sister; Hypertension in her father, mother, and sister; Kidney disease in her son; Stroke in her father and mother.  ROS:   Please see the history of present illness.     All other systems reviewed and are negative.  EKGs/Labs/Other Studies Reviewed:    The following studies were reviewed today: 05/2018 LHC  Total occlusion of the mid to distal RCA. Distal RCA fills via well-formed collaterals around the left ventricular apex from the LAD.  Total occlusion of a relatively small circumflex. Minimal collaterals are noted.  Ostial 60% left main with pressure damping noted during engagement. At least 60% distal left main is noted.  Heavily calcified 80% proximal LAD followed by eccentric calcified slitlike 70% mid LAD stenosis.  Normal LV function  with EF 55%.  The patient has a single remaining conduit which is the left main coronary artery supplying the LAD. Both the circumflex and right coronary are occluded. The distal left main, proximal LAD is aneurysmal and heavily calcified. The LAD supplies well-formed collaterals to the PDA.  Recommend evaluation for potential surgical revascularization.  No appealing interventional options exist.  Carotid US 05/19/2018  Summary: Right Carotid: There is no evidence of stenosis in the right ICA. Patent CEA.  Left Carotid: Velocities in the left ICA are consistent with a 80-99% stenosis. Significant acoustic shadowing and the bifurcation.  Vertebrals: Bilateral vertebral arteries demonstrate antegrade flow. Subclavians: Normal flow hemodynamics were seen in bilateral subclavian arteries. *See table(s) above for measurements  and observations.  Echo 05/20/18: Study Conclusions - Left ventricle: The cavity size was normal. Wall thickness was normal. Systolic function was normal. The estimated ejection fraction was in the range of60% to 65%. Hypokinesisof the mid-apicalinferolateral myocardium; consistent with ischemia in the distribution of the right coronary or left circumflex coronary artery. Doppler parameters are consistent with abnormal left ventricular relaxation (grade 1 diastolic dysfunction). - Mitral valve: There was mild regurgitation.  EKG:  EKG is  ordered today.  The ekg ordered today demonstrates normal sinus rhythm, possible left atrial enlargement.  Recent Labs: 10/27/2018: BUN 18; Creatinine, Ser 1.10; Potassium 4.6; Sodium 142  Recent Lipid Panel    Component Value Date/Time   CHOL 99 05/20/2018 0249   TRIG 152 (H) 05/27/2018 0428   HDL 28 (L) 05/20/2018 0249   CHOLHDL 3.5 05/20/2018 0249   VLDL 28 05/20/2018 0249   LDLCALC 43 05/20/2018 0249    Physical Exam:    VS:  BP (!) 174/92 (BP Location: Left Arm,  Patient Position: Sitting, Cuff Size: Normal)   Pulse 70   Ht 5\' 1"  (1.549 m)   Wt 129 lb (58.5 kg)   BMI 24.37 kg/m     Wt Readings from Last 3 Encounters:  06/19/19 129 lb (58.5 kg)  06/16/19 128 lb 11.2 oz (58.4 kg)  05/20/19 127 lb 4 oz (57.7 kg)     GEN:  Well nourished, well developed in no acute distress HEENT: Normal NECK: No JVD; bilateral carotid bruits noted LYMPHATICS: No lymphadenopathy CARDIAC: RRR, 2/6 systolic murmur at right upper sternal border RESPIRATORY:  Clear to auscultation without rales, wheezing or rhonchi  ABDOMEN: Soft, non-tender, non-distended MUSCULOSKELETAL:  No edema; No deformity  SKIN: Warm and dry NEUROLOGIC:  Alert and oriented x 3 PSYCHIATRIC:  Normal affect   ASSESSMENT:   Patient is without any symptoms of chest pain.  Her blood pressure is still poorly controlled.  She is on a good blood pressure regimen but does not include a diuretic. 1. Essential hypertension   2. Coronary artery disease involving native coronary artery of native heart without angina pectoris    PLAN:    In order of problems listed above:  1. Continue Norvasc 10 mg daily, lisinopril 40 mg daily, Imdur 15 mg daily, Lopressor 50 mg twice daily.  Add chlorthalidone 25 mg daily.  She was advised to use hydralazine 50 3 times daily if blood pressure stays over 160.  Keep blood pressure log, check BMP in 2 weeks. 2. Continue dual antiplatelet therapy with aspirin Plavix.  Continue Crestor 20 mg daily.  Imdur 30 mg daily.  Last ejection fraction was normal with grade 1 diastolic dysfunction.  Follow-up in 1 month.  This note was generated in part or whole with voice recognition software. Voice recognition is usually quite accurate but there are transcription errors that can and very often do occur. I apologize for any typographical errors that were not detected and corrected.  Medication Adjustments/Labs and Tests Ordered: Current medicines are reviewed at length with the  patient today.  Concerns regarding medicines are outlined above.  Orders Placed This Encounter  Procedures  . Basic metabolic panel  . EKG 12-Lead   Meds ordered this encounter  Medications  . chlorthalidone (HYGROTON) 25 MG tablet    Sig: Take 1 tablet (25 mg total) by mouth daily.    Dispense:  30 tablet    Refill:  5    Patient Instructions  Medication Instructions:  Your physician has recommended  you make the following change in your medication:   START Chlorthalidone  daily. An Rx has been sent to your pharmacy.  *If you need a refill on your cardiac medications before your next appointment, please call your pharmacy*  Lab Work: Your physician recommends that you return for lab work in: 2 weeks (Bmet)  Please have your lab drawn at the [facility]. You do not need an appointment. There hours are Mon-Fri 7am-6pm  If you have labs (blood work) drawn today and your tests are completely normal, you will receive your results only by: [name] MyChart Message (if you have MyChart) OR . A paper copy in the mail If you have any lab test that is abnormal or we need to change your treatment, we will call you to review the results.  Testing/Procedures: None ordered  Follow-Up: At [facility], you and your health needs are our priority.  As part of our continuing mission to provide you with exceptional heart care, we have created designated Provider Care Teams.  These Care Teams include your primary Cardiologist (physician) and Advanced Practice Providers (APPs -  Physician Assistants and Nurse Practitioners) who all work together to provide you with the care you need, when you need it.  Your next appointment:   4 weeks  The format for your next appointment:   In Person  Provider:   Debbe Odea, MD  Other Instructions Your physician has requested that you regularly monitor and record your blood pressure readings at home. Please use the same machine at the same  time of day to check your readings and record them to bring to your follow-up visit.       Signed, Debbe Odea, MD  06/19/2019 4:48 PM    [facility]

## 2019-06-19 NOTE — Patient Instructions (Signed)
Medication Instructions:  Your physician has recommended you make the following change in your medication:   START Chlorthalidone 25mg  daily. An Rx has been sent to your pharmacy.  *If you need a refill on your cardiac medications before your next appointment, please call your pharmacy*  Lab Work: Your physician recommends that you return for lab work in: 2 weeks (Bmet)  Please have your lab drawn at the [facility]. You do not need an appointment. There hours are Mon-Fri 7am-6pm  If you have labs (blood work) drawn today and your tests are completely normal, you will receive your results only by: [name] MyChart Message (if you have MyChart) OR . A paper copy in the mail If you have any lab test that is abnormal or we need to change your treatment, we will call you to review the results.  Testing/Procedures: None ordered  Follow-Up: At [facility], you and your health needs are our priority.  As part of our continuing mission to provide you with exceptional heart care, we have created designated Provider Care Teams.  These Care Teams include your primary Cardiologist (physician) and Advanced Practice Providers (APPs -  Physician Assistants and Nurse Practitioners) who all work together to provide you with the care you need, when you need it.  Your next appointment:   4 weeks  The format for your next appointment:   In Person  Provider:   Kate Sable, MD  Other Instructions Your physician has requested that you regularly monitor and record your blood pressure readings at home. Please use the same machine at the same time of day to check your readings and record them to bring to your follow-up visit.

## 2019-06-22 ENCOUNTER — Ambulatory Visit: Payer: Medicare Other | Admitting: Cardiology

## 2019-07-03 ENCOUNTER — Other Ambulatory Visit
Admission: RE | Admit: 2019-07-03 | Discharge: 2019-07-03 | Disposition: A | Discharge status: Home / Self Care | Payer: Medicare Other | Source: Ambulatory Visit | Attending: Cardiology | Admitting: Cardiology

## 2019-07-03 DIAGNOSIS — I10 Essential (primary) hypertension: Secondary | ICD-10-CM | POA: Insufficient documentation

## 2019-07-03 LAB — BASIC METABOLIC PANEL
Anion gap: 10 (ref 5–15)
BUN: 26 mg/dL — ABNORMAL HIGH (ref 8–23)
CO2: 26 mmol/L (ref 22–32)
Calcium: 9.3 mg/dL (ref 8.9–10.3)
Chloride: 103 mmol/L (ref 98–111)
Creatinine, Ser: 1.19 mg/dL — ABNORMAL HIGH (ref 0.44–1.00)
GFR calc Af Amer: 51 mL/min — ABNORMAL LOW (ref >60)
GFR calc non Af Amer: 44 mL/min — ABNORMAL LOW (ref >60)
Glucose, Bld: 100 mg/dL — ABNORMAL HIGH (ref 70–99)
Potassium: 4.4 mmol/L (ref 3.5–5.1)
Sodium: 139 mmol/L (ref 135–145)

## 2019-07-16 ENCOUNTER — Other Ambulatory Visit: Payer: Self-pay | Admitting: Family Medicine

## 2019-07-16 DIAGNOSIS — G8929 Other chronic pain: Secondary | ICD-10-CM

## 2019-07-16 DIAGNOSIS — M5442 Lumbago with sciatica, left side: Secondary | ICD-10-CM

## 2019-07-16 DIAGNOSIS — M25552 Pain in left hip: Secondary | ICD-10-CM

## 2019-07-16 DIAGNOSIS — Z87828 Personal history of other (healed) physical injury and trauma: Secondary | ICD-10-CM

## 2019-07-16 DIAGNOSIS — Z0289 Encounter for other administrative examinations: Secondary | ICD-10-CM

## 2019-07-16 NOTE — Telephone Encounter (Signed)
Requested medication (s) are due for refill today:yes  Requested medication (s) are on the active medication list: yes  Last refill:  06/16/2019  Future visit scheduled: yes  Notes to clinic: refill cannot be delegated    Requested Prescriptions  Pending Prescriptions Disp Refills   HYDROcodone-acetaminophen (NORCO) 10-325 MG tablet 180 tablet 0    Sig: Take 1 tablet by mouth every 4 (four) hours as needed.      Not Delegated - Analgesics:  Opioid Agonist Combinations Failed - 07/16/2019  9:45 AM      Failed - This refill cannot be delegated      Failed - Urine Drug Screen completed in last 360 days.      Passed - Valid encounter within last 6 months    Recent Outpatient Visits           1 month ago Peripheral vascular disease [facility])   [facility] Delsa Grana, PA-C       Future Appointments             In 2 weeks Agbor-Etang, Aaron Edelman, MD [facility]   In 2 months Delsa Grana, PA-C [facility], [facility]

## 2019-07-16 NOTE — Telephone Encounter (Signed)
Medication Refill - Medication: HYDROcodone-acetaminophen (NORCO) 10-325 MG tablet  Has the patient contacted their pharmacy? no (Agent: If no, request that the patient contact the pharmacy for the refill.) (Agent: If yes, when and what did the pharmacy advise?)  Preferred Pharmacy (with phone number or street name):  [id number] - [place], [place] [address] Phone:  [phone]  Fax:  [phone]     Agent: Please be advised that RX refills may take up to 3 business days. We ask that you follow-up with your pharmacy.

## 2019-07-17 ENCOUNTER — Ambulatory Visit: Payer: Medicare Other | Admitting: Cardiology

## 2019-07-17 MED ORDER — HYDROCODONE-ACETAMINOPHEN 10-325 MG PO TABS: 1.0000 | ORAL_TABLET | ORAL | 0 refills | Status: DC | PRN

## 2019-07-17 NOTE — Telephone Encounter (Signed)
Pt following up on Rx for hydrocodone

## 2019-08-03 ENCOUNTER — Ambulatory Visit (INDEPENDENT_AMBULATORY_CARE_PROVIDER_SITE_OTHER): Payer: Medicare Other | Admitting: Cardiology

## 2019-08-03 ENCOUNTER — Other Ambulatory Visit: Payer: Self-pay

## 2019-08-03 ENCOUNTER — Encounter: Payer: Self-pay | Admitting: Cardiology

## 2019-08-03 VITALS — BP 158/100 | HR 69 | Ht 67.0 in | Wt 125.0 lb

## 2019-08-03 DIAGNOSIS — I2510 Atherosclerotic heart disease of native coronary artery without angina pectoris: Secondary | ICD-10-CM

## 2019-08-03 DIAGNOSIS — I10 Essential (primary) hypertension: Secondary | ICD-10-CM

## 2019-08-03 NOTE — Progress Notes (Signed)
Cardiology Office Note:    Date:  08/03/2019   ID:  Elizabeth BasqueVivian M Beyl, DOB 09-Apr-1941, MRN [id number]  PCP:  Danelle Berryapia, Leisa, PA-C  Cardiologist:  Debbe OdeaBrian Agbor-Etang, MD  Electrophysiologist:  None   Referring MD: Danelle Berryapia, Leisa, PA-C   Chief Complaint  Patient presents with  . other    Pt 4 week f/u no complaints. Meds verbally reviewed w/ pt.    History of Present Illness:    Elizabeth [name] is a 78 y.o. 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    Patient used to be seen by Dr. Gery PrayBarry at [place].  Patient wanted to establish care at the [facility] practice.  In October 2019, she underwent a left heart cath showing multivessel disease but was not seen as a candidate for revascularization.  Medical therapy was recommended.  The plan is if she develops symptoms in the future, high risk PCI may be considered.  She is being followed by vascular surgery for management of AAA, carotid and peripheral vascular disease.  She denies any symptoms of chest pain or shortness of breath.  Takes all her medications as prescribed.  Blood pressure was elevated after last visit.  She was started on chlorthalidone 25 mg daily.  Her blood pressure log has been anywhere from low 100s to 150s systolic over the past month.  She had to take her as needed hydralazine for 3 days only for systolic blood pressures greater than 160 mmHg after her nephew died.  Past Medical History:  Diagnosis Date  . 3-vessel CAD    05/2018 LHC with a single remaining conduit which is the left main coronary artery supplying the LAD.  Both the circumflex and right coronary artery were occluded.  The distal left main, proximal LAD is aneurysmal and heavily calcified.  The LAD supplies well-formed collaterals to the PDA.  Not felt to be candidate for revascularization  and with recommendation for medical mgmt  . AAA (abdominal aortic aneurysm) (HCC)    4.3cm (12/2018)  . Celiac artery stenosis (HCC)   . Degenerative joint disease of left hip   . Heart failure with preserved ejection fraction (HCC)    EF 60-65%, 05/2018  . History of non-ST elevation myocardial infarction (NSTEMI)    05/2018 with subsequent LHC and in the setting of acute aortic ulcer and hematoma  . Hyperlipidemia   . Hypertension   . Internal carotid artery stenosis, left    80-99% 05/2018  . Intramural hematoma of thoracic aorta (HCC)    05/2018  . Mitral regurgitation    Mild by 05/2018 echo  . Paroxysmal SVT (supraventricular tachycardia) (HCC)    05/2018  . Peripheral vascular disease (HCC)    extensive vascular dz s/p b/l carotid endarterectomies (2014) and iliac stenting, AAA, aortic ulcer  . Right renal artery stenosis (HCC)    05/2018  . Smoking hx    Quit ~8295~1992  . Status post bilateral carotid endarterectomy    Followed by Vascular surgery with most recent images 05/2018 showing L ICA 80-99% stenosis  . Subclavian artery stenosis, left (HCC)    05/2018    Past Surgical History:  Procedure Laterality Date  . APPENDECTOMY    . CARDIAC CATHETERIZATION    . HIP SURGERY Left    x3  . LEFT HEART CATH AND CORONARY ANGIOGRAPHY N/A 05/19/2018   Procedure:  LEFT HEART CATH AND CORONARY ANGIOGRAPHY;  Surgeon: Belva Crome, MD;  Location: [place] [facility];  Service: Cardiovascular;  Laterality: N/A;  . PERIPHERAL VASCULAR CATHETERIZATION Right 02/02/2016   Procedure: Lower Extremity Angiography;  Surgeon: Algernon Huxley, MD;  Location: [place] [facility];  Service: Cardiovascular;  Laterality: Right;    Current Medications: Current Meds  Medication Sig  . amLODipine (NORVASC) 10 MG tablet Take 1 tablet (10 mg total) by mouth daily.  . Aspirin-Acetaminophen-Caffeine (GOODY HEADACHE PO) Take 1 packet by mouth daily as needed (headache).  . chlorthalidone (HYGROTON) 25 MG  tablet Take 1 tablet (25 mg total) by mouth daily.  . cholecalciferol (VITAMIN D) 1000 units tablet Take 1,000 Units by mouth daily.  . clopidogrel (PLAVIX) 75 MG tablet Take 1 tablet (75 mg total) by mouth daily.  . Cyanocobalamin (VITAMIN B-12 PO) Take 1 tablet by mouth daily.  . hydrALAZINE (APRESOLINE) 50 MG tablet Take 1 tablet (50 mg total) by mouth 3 (three) times daily between meals as needed (bp high).  [name] HYDROcodone-acetaminophen (NORCO) 10-325 MG tablet Take 1 tablet by mouth every 4 (four) hours as needed.  . isosorbide mononitrate (IMDUR) 30 MG 24 hr tablet Take 0.5 tablets (15 mg total) by mouth daily.  [name] lisinopril (ZESTRIL) 40 MG tablet Take 1 tablet (40 mg total) by mouth daily.  . metoprolol tartrate (LOPRESSOR) 100 MG tablet Take 1 tablet (100 mg total) by mouth 2 (two) times daily.  . Multiple Vitamin (MULTIVITAMIN) tablet Take 1 tablet by mouth daily.  . nitroGLYCERIN (NITROSTAT) 0.4 MG SL tablet Place 1 tablet (0.4 mg total) under the tongue every 5 (five) minutes as needed for chest pain.  . potassium citrate (UROCIT-K) 10 MEQ (1080 MG) SR tablet Take 10 mEq by mouth 2 (two) times daily.  . rosuvastatin (CRESTOR) 20 MG tablet Take 1 tablet (20 mg total) by mouth at bedtime.     Allergies:   Codeine and Sulfa antibiotics   Social History   Socioeconomic History  . Marital status: Widowed    Spouse name: Not on file  . Number of children: 2  . Years of education: Not on file  . Highest education level: GED or equivalent  Occupational History  . Occupation: diabled  Tobacco Use  . Smoking status: Former Smoker    Quit date: 02/02/1999    Years since quitting: 20.5  . Smokeless tobacco: Never Used  Substance and Sexual Activity  . Alcohol use: No  . Drug use: No  . Sexual activity: Not Currently  Other Topics Concern  . Not on file  Social History Narrative  . Not on file   Social Determinants of Health   Financial Resource Strain: Low Risk   . Difficulty  of Paying Living Expenses: Not hard at all  Food Insecurity: No Food Insecurity  . Worried About Charity fundraiser in the Last Year: Never true  . Ran Out of Food in the Last Year: Never true  Transportation Needs: No Transportation Needs  . Lack of Transportation (Medical): No  . Lack of Transportation (Non-Medical): No  Physical Activity: Inactive  . Days of Exercise per Week: 0 days  . Minutes of Exercise per Session: 0 min  Stress: No Stress Concern Present  . Feeling of Stress : Only a little  Social Connections: Somewhat Isolated  . Frequency of Communication with Friends and Family: More than three times a week  . Frequency of Social Gatherings with Friends and  Family: More than three times a week  . Attends Religious Services: More than 4 times per year  . Active Member of Clubs or Organizations: No  . Attends Banker Meetings: Never  . Marital Status: Widowed     Family History: The patient's family history includes Heart disease in her son; Hodgkin's lymphoma in her son; Hyperlipidemia in her sister; Hypertension in her father, mother, and sister; Kidney disease in her son; Stroke in her father and mother.  ROS:   Please see the history of present illness.     All other systems reviewed and are negative.  EKGs/Labs/Other Studies Reviewed:    The following studies were reviewed today: 05/2018 LHC  Total occlusion of the mid to distal RCA. Distal RCA fills via well-formed collaterals around the left ventricular apex from the LAD.  Total occlusion of a relatively small circumflex. Minimal collaterals are noted.  Ostial 60% left main with pressure damping noted during engagement. At least 60% distal left main is noted.  Heavily calcified 80% proximal LAD followed by eccentric calcified slitlike 70% mid LAD stenosis.  Normal LV function with EF 55%.  The patient has a single remaining conduit which is the left main coronary artery supplying the LAD.  Both the circumflex and right coronary are occluded. The distal left main, proximal LAD is aneurysmal and heavily calcified. The LAD supplies well-formed collaterals to the PDA.  Recommend evaluation for potential surgical revascularization.  No appealing interventional options exist.  Carotid US 05/19/2018  Summary: Right Carotid: There is no evidence of stenosis in the right ICA. Patent CEA.  Left Carotid: Velocities in the left ICA are consistent with a 80-99% stenosis. Significant acoustic shadowing and the bifurcation.  Vertebrals: Bilateral vertebral arteries demonstrate antegrade flow. Subclavians: Normal flow hemodynamics were seen in bilateral subclavian arteries. *See table(s) above for measurements and observations.  Echo 05/20/18: Study Conclusions - Left ventricle: The cavity size was normal. Wall thickness was normal. Systolic function was normal. The estimated ejection fraction was in the range of60% to 65%. Hypokinesisof the mid-apicalinferolateral myocardium; consistent with ischemia in the distribution of the right coronary or left circumflex coronary artery. Doppler parameters are consistent with abnormal left ventricular relaxation (grade 1 diastolic dysfunction). - Mitral valve: There was mild regurgitation.  EKG:  EKG is  ordered today.  The ekg ordered today demonstrates normal sinus rhythm, normal ECG.  Recent Labs: 07/03/2019: BUN 26; Creatinine, Ser 1.19; Potassium 4.4; Sodium 139  Recent Lipid Panel    Component Value Date/Time   CHOL 99 05/20/2018 0249   TRIG 152 (H) 05/27/2018 0428   HDL 28 (L) 05/20/2018 0249   CHOLHDL 3.5 05/20/2018 0249   VLDL 28 05/20/2018 0249   LDLCALC 43 05/20/2018 0249    Physical Exam:    VS:  BP (!) 158/100 (BP Location: Left Arm, Patient Position: Sitting, Cuff Size: Normal)   Pulse 69   Ht 5\' 7"  (1.702 m)   Wt 125 lb (56.7 kg)   SpO2 98%   BMI 19.58 kg/m      Wt Readings from Last 3 Encounters:  08/03/19 125 lb (56.7 kg)  06/19/19 129 lb (58.5 kg)  06/16/19 128 lb 11.2 oz (58.4 kg)     GEN:  Well nourished, well developed in no acute distress HEENT: Normal NECK: No JVD; bilateral carotid bruits noted LYMPHATICS: No lymphadenopathy CARDIAC: RRR, 2/6 systolic murmur at right upper sternal border RESPIRATORY:  Clear to auscultation without rales, wheezing or rhonchi  ABDOMEN:  Soft, non-tender, non-distended MUSCULOSKELETAL:  No edema; No deformity  SKIN: Warm and dry NEUROLOGIC:  Alert and oriented x 3 PSYCHIATRIC:  Normal affect   ASSESSMENT:   Patient is without any symptoms of chest pain.  Her blood pressure is still elevated but better controlled.  Repeat blood pressure performed by myself was 150/100.  Her bring BP log from home showed blood pressures anywhere from 115 -151 systolic.  We will not increase antihypertensive at this time.  Patient likely has whitecoat syndrome. 1. Essential hypertension   2. Coronary artery disease involving native coronary artery of native heart without angina pectoris    PLAN:    In order of problems listed above:  1. Continue Norvasc 10 mg daily, lisinopril 40 mg daily, Imdur 15 mg daily, Lopressor 50 mg twice daily.  chlorthalidone 25 mg daily.  She was advised to use hydralazine  3 times daily if blood pressure stays over 160.  Keep blood pressure log 2. Continue dual antiplatelet therapy with aspirin Plavix.  Continue Crestor 20 mg daily.  Imdur 30 mg daily.  Last ejection fraction was normal with grade 1 diastolic dysfunction.  Follow-up in 3 month.  This note was generated in part or whole with voice recognition software. Voice recognition is usually quite accurate but there are transcription errors that can and very often do occur. I apologize for any typographical errors that were not detected and corrected.  Medication Adjustments/Labs and Tests Ordered: Current medicines are  reviewed at length with the patient today.  Concerns regarding medicines are outlined above.  Orders Placed This Encounter  Procedures  . EKG 12-Lead   No orders of the defined types were placed in this encounter.   Patient Instructions  Medication Instructions:  No medication changes.  *If you need a refill on your cardiac medications before your next appointment, please call your pharmacy*  Lab Work: No lab work ordered. If you have labs (blood work) drawn today and your tests are completely normal, you will receive your results only by: [name] MyChart Message (if you have MyChart) OR . A paper copy in the mail If you have any lab test that is abnormal or we need to change your treatment, we will call you to review the results.  Testing/Procedures: No test ordered.   Follow-Up: At [facility], you and your health needs are our priority.  As part of our continuing mission to provide you with exceptional heart care, we have created designated Provider Care Teams.  These Care Teams include your primary Cardiologist (physician) and Advanced Practice Providers (APPs -  Physician Assistants and Nurse Practitioners) who all work together to provide you with the care you need, when you need it.  Your next appointment:   3 month(s)  The format for your next appointment:   In Person  Provider:    You may see Debbe Odea, MD or one of the following Advanced Practice Providers on your designated Care Team:    Nicolasa Ducking, NP  Eula Listen, PA-C  Marisue Ivan, PA-C   Other Instructions NA     Signed, Debbe Odea, MD  08/03/2019 3:09 PM    [facility]

## 2019-08-03 NOTE — Patient Instructions (Signed)
Medication Instructions:  No medication changes.  *If you need a refill on your cardiac medications before your next appointment, please call your pharmacy*  Lab Work: No lab work ordered. If you have labs (blood work) drawn today and your tests are completely normal, you will receive your results only by: [name] MyChart Message (if you have MyChart) OR . A paper copy in the mail If you have any lab test that is abnormal or we need to change your treatment, we will call you to review the results.  Testing/Procedures: No test ordered.   Follow-Up: At [facility], you and your health needs are our priority.  As part of our continuing mission to provide you with exceptional heart care, we have created designated Provider Care Teams.  These Care Teams include your primary Cardiologist (physician) and Advanced Practice Providers (APPs -  Physician Assistants and Nurse Practitioners) who all work together to provide you with the care you need, when you need it.  Your next appointment:   3 month(s)  The format for your next appointment:   In Person  Provider:    You may see Kate Sable, MD or one of the following Advanced Practice Providers on your designated Care Team:    Murray Hodgkins, NP  Christell Faith, PA-C  Marrianne Mood, PA-C   Other Instructions NA

## 2019-08-09 ENCOUNTER — Other Ambulatory Visit: Payer: Self-pay | Admitting: Cardiology
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
## 2019-08-13 ENCOUNTER — Encounter: Payer: Self-pay | Admitting: Student in an Organized Health Care Education/Training Program

## 2019-08-13 ENCOUNTER — Other Ambulatory Visit: Payer: Self-pay

## 2019-08-13 ENCOUNTER — Ambulatory Visit
Payer: Medicare Other | Attending: Student in an Organized Health Care Education/Training Program | Admitting: Student in an Organized Health Care Education/Training Program

## 2019-08-13 DIAGNOSIS — G8921 Chronic pain due to trauma: Secondary | ICD-10-CM | POA: Diagnosis not present

## 2019-08-13 DIAGNOSIS — Z79891 Long term (current) use of opiate analgesic: Secondary | ICD-10-CM | POA: Diagnosis not present

## 2019-08-13 DIAGNOSIS — M1652 Unilateral post-traumatic osteoarthritis, left hip: Secondary | ICD-10-CM

## 2019-08-13 DIAGNOSIS — M25552 Pain in left hip: Secondary | ICD-10-CM

## 2019-08-13 DIAGNOSIS — I739 Peripheral vascular disease, unspecified: Secondary | ICD-10-CM

## 2019-08-13 DIAGNOSIS — I25118 Atherosclerotic heart disease of native coronary artery with other forms of angina pectoris: Secondary | ICD-10-CM

## 2019-08-13 DIAGNOSIS — Z9889 Other specified postprocedural states: Secondary | ICD-10-CM

## 2019-08-13 DIAGNOSIS — G8929 Other chronic pain: Secondary | ICD-10-CM | POA: Insufficient documentation

## 2019-08-18 ENCOUNTER — Other Ambulatory Visit: Payer: Self-pay | Admitting: Family Medicine

## 2019-08-18 DIAGNOSIS — G8929 Other chronic pain: Secondary | ICD-10-CM

## 2019-08-18 DIAGNOSIS — Z0289 Encounter for other administrative examinations: Secondary | ICD-10-CM

## 2019-08-18 DIAGNOSIS — Z87828 Personal history of other (healed) physical injury and trauma: Secondary | ICD-10-CM

## 2019-08-18 NOTE — Telephone Encounter (Signed)
Pt called asking for a refill on her Hydrocodone 10/325.  She has spoke with Pain Mgmt and they are sending her some paperwork to complete and send back.  In the meantime they [facility] her back to primary  [facility]  CB#   [phone]

## 2019-08-19 ENCOUNTER — Other Ambulatory Visit
Admission: RE | Admit: 2019-08-19 | Discharge: 2019-08-19 | Disposition: A | Discharge status: Home / Self Care | Payer: Medicare Other | Source: Ambulatory Visit | Attending: Student in an Organized Health Care Education/Training Program | Admitting: Student in an Organized Health Care Education/Training Program

## 2019-08-19 DIAGNOSIS — G8921 Chronic pain due to trauma: Secondary | ICD-10-CM | POA: Insufficient documentation

## 2019-08-19 LAB — URINE DRUG SCREEN, QUALITATIVE (ARMC ONLY)
Amphetamines, Ur Screen: NOT DETECTED
Barbiturates, Ur Screen: NOT DETECTED
Benzodiazepine, Ur Scrn: NOT DETECTED
Cannabinoid 50 Ng, Ur ~~LOC~~: NOT DETECTED
Cocaine Metabolite,Ur ~~LOC~~: NOT DETECTED
MDMA (Ecstasy)Ur Screen: NOT DETECTED
Methadone Scn, Ur: NOT DETECTED
Opiate, Ur Screen: POSITIVE — AB
Phencyclidine (PCP) Ur S: NOT DETECTED
Tricyclic, Ur Screen: NOT DETECTED

## 2019-08-20 ENCOUNTER — Other Ambulatory Visit: Payer: Self-pay | Admitting: Family Medicine

## 2019-08-20 DIAGNOSIS — Z0289 Encounter for other administrative examinations: Secondary | ICD-10-CM

## 2019-08-20 DIAGNOSIS — Z87828 Personal history of other (healed) physical injury and trauma: Secondary | ICD-10-CM

## 2019-08-20 DIAGNOSIS — G8929 Other chronic pain: Secondary | ICD-10-CM

## 2019-08-20 MED ORDER — HYDROCODONE-ACETAMINOPHEN 10-325 MG PO TABS: 1.0000 | ORAL_TABLET | ORAL | 0 refills | Status: DC | PRN

## 2019-08-20 NOTE — Telephone Encounter (Signed)
Medication Refill - Medication: HYDROcodone-acetaminophen (NORCO) 10-325 MG tablet    Has the patient contacted their pharmacy? No. (Agent: If no, request that the patient contact the pharmacy for the refill.) (Agent: If yes, when and what did the pharmacy advise?)  Preferred Pharmacy (with phone number or street name):  [id number] [place] [place] - [address] Phone:  [phone]  Fax:  [phone]       Agent: Please be advised that RX refills may take up to 3 business days. We ask that you follow-up with your pharmacy.  

## 2019-09-16 ENCOUNTER — Other Ambulatory Visit: Payer: Self-pay

## 2019-09-16 ENCOUNTER — Ambulatory Visit (INDEPENDENT_AMBULATORY_CARE_PROVIDER_SITE_OTHER): Payer: Medicare Other | Admitting: Family Medicine

## 2019-09-16 ENCOUNTER — Encounter: Payer: Self-pay | Admitting: Family Medicine

## 2019-09-16 VITALS — BP 125/64 | HR 67 | Ht 61.0 in | Wt 127.0 lb

## 2019-09-16 DIAGNOSIS — E785 Hyperlipidemia, unspecified: Secondary | ICD-10-CM

## 2019-09-16 DIAGNOSIS — I714 Abdominal aortic aneurysm, without rupture, unspecified: Secondary | ICD-10-CM

## 2019-09-16 DIAGNOSIS — I25118 Atherosclerotic heart disease of native coronary artery with other forms of angina pectoris: Secondary | ICD-10-CM

## 2019-09-16 DIAGNOSIS — Z0289 Encounter for other administrative examinations: Secondary | ICD-10-CM

## 2019-09-16 DIAGNOSIS — I10 Essential (primary) hypertension: Secondary | ICD-10-CM | POA: Diagnosis not present

## 2019-09-16 DIAGNOSIS — G8929 Other chronic pain: Secondary | ICD-10-CM

## 2019-09-16 DIAGNOSIS — I7103 Dissection of thoracoabdominal aorta: Secondary | ICD-10-CM

## 2019-09-16 DIAGNOSIS — M25552 Pain in left hip: Secondary | ICD-10-CM | POA: Diagnosis not present

## 2019-09-16 DIAGNOSIS — Z87828 Personal history of other (healed) physical injury and trauma: Secondary | ICD-10-CM | POA: Diagnosis not present

## 2019-09-16 DIAGNOSIS — M5442 Lumbago with sciatica, left side: Secondary | ICD-10-CM

## 2019-09-16 DIAGNOSIS — I739 Peripheral vascular disease, unspecified: Secondary | ICD-10-CM

## 2019-09-16 MED ORDER — HYDROCODONE-ACETAMINOPHEN 10-325 MG PO TABS: 1.0000 | ORAL_TABLET | ORAL | 0 refills | Status: DC | PRN
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
## 2019-09-21 ENCOUNTER — Other Ambulatory Visit: Payer: Self-pay

## 2019-09-21 MED ORDER — CHLORTHALIDONE 25 MG PO TABS: 25.0000 mg | ORAL_TABLET | Freq: Every day | ORAL | 5 refills | Status: DC

## 2019-09-22 ENCOUNTER — Other Ambulatory Visit: Payer: Self-pay

## 2019-09-22 ENCOUNTER — Encounter: Payer: Self-pay | Admitting: Psychiatry

## 2019-09-22 ENCOUNTER — Ambulatory Visit: Payer: Medicare Other | Admitting: Psychiatry

## 2019-09-30 ENCOUNTER — Other Ambulatory Visit: Payer: Self-pay

## 2019-09-30 DIAGNOSIS — I10 Essential (primary) hypertension: Secondary | ICD-10-CM

## 2019-09-30 DIAGNOSIS — E785 Hyperlipidemia, unspecified: Secondary | ICD-10-CM

## 2019-10-02 DIAGNOSIS — E785 Hyperlipidemia, unspecified: Secondary | ICD-10-CM | POA: Diagnosis not present

## 2019-10-02 DIAGNOSIS — I25118 Atherosclerotic heart disease of native coronary artery with other forms of angina pectoris: Secondary | ICD-10-CM | POA: Diagnosis not present

## 2019-10-02 DIAGNOSIS — I714 Abdominal aortic aneurysm, without rupture: Secondary | ICD-10-CM | POA: Diagnosis not present

## 2019-10-02 DIAGNOSIS — I7103 Dissection of thoracoabdominal aorta: Secondary | ICD-10-CM | POA: Diagnosis not present

## 2019-10-02 DIAGNOSIS — I10 Essential (primary) hypertension: Secondary | ICD-10-CM | POA: Diagnosis not present

## 2019-10-03 LAB — COMPREHENSIVE METABOLIC PANEL
AG Ratio: 1.6 (calc) (ref 1.0–2.5)
ALT: 13 U/L (ref 6–29)
AST: 20 U/L (ref 10–35)
Albumin: 4.4 g/dL (ref 3.6–5.1)
Alkaline phosphatase (APISO): 77 U/L (ref 37–153)
BUN/Creatinine Ratio: 23 (calc) — ABNORMAL HIGH (ref 6–22)
BUN: 23 mg/dL (ref 7–25)
CO2: 27 mmol/L (ref 20–32)
Calcium: 9.6 mg/dL (ref 8.6–10.4)
Chloride: 105 mmol/L (ref 98–110)
Creat: 1 mg/dL — ABNORMAL HIGH (ref 0.60–0.93)
Globulin: 2.7 g/dL (calc) (ref 1.9–3.7)
Glucose, Bld: 100 mg/dL — ABNORMAL HIGH (ref 65–99)
Potassium: 4.1 mmol/L (ref 3.5–5.3)
Sodium: 142 mmol/L (ref 135–146)
Total Bilirubin: 0.4 mg/dL (ref 0.2–1.2)
Total Protein: 7.1 g/dL (ref 6.1–8.1)

## 2019-10-03 LAB — LIPID PANEL
Cholesterol: 162 mg/dL (ref <200)
HDL: 42 mg/dL — ABNORMAL LOW (ref >=50)
LDL Cholesterol (Calc): 90 mg/dL (calc)
Non-HDL Cholesterol (Calc): 120 mg/dL (calc) (ref <130)
Total CHOL/HDL Ratio: 3.9 (calc) (ref <5.0)
Triglycerides: 201 mg/dL — ABNORMAL HIGH (ref <150)

## 2019-10-06 ENCOUNTER — Other Ambulatory Visit: Payer: Self-pay

## 2019-10-06 ENCOUNTER — Encounter: Payer: Self-pay | Admitting: Psychiatry

## 2019-10-06 ENCOUNTER — Ambulatory Visit (INDEPENDENT_AMBULATORY_CARE_PROVIDER_SITE_OTHER): Payer: Medicare Other | Admitting: Psychiatry

## 2019-10-06 DIAGNOSIS — Z008 Encounter for other general examination: Secondary | ICD-10-CM

## 2019-10-06 DIAGNOSIS — G8921 Chronic pain due to trauma: Secondary | ICD-10-CM
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
## 2019-10-19 ENCOUNTER — Encounter: Payer: Self-pay | Admitting: Student in an Organized Health Care Education/Training Program

## 2019-10-20 ENCOUNTER — Encounter: Payer: Self-pay | Admitting: Student in an Organized Health Care Education/Training Program

## 2019-10-20 ENCOUNTER — Ambulatory Visit
Payer: Medicare Other | Attending: Student in an Organized Health Care Education/Training Program | Admitting: Student in an Organized Health Care Education/Training Program

## 2019-10-20 ENCOUNTER — Other Ambulatory Visit: Payer: Self-pay

## 2019-10-20 DIAGNOSIS — G8929 Other chronic pain: Secondary | ICD-10-CM

## 2019-10-20 DIAGNOSIS — Z9889 Other specified postprocedural states: Secondary | ICD-10-CM | POA: Diagnosis not present

## 2019-10-20 DIAGNOSIS — G8921 Chronic pain due to trauma: Secondary | ICD-10-CM

## 2019-10-20 DIAGNOSIS — I739 Peripheral vascular disease, unspecified: Secondary | ICD-10-CM

## 2019-10-20 DIAGNOSIS — Z87828 Personal history of other (healed) physical injury and trauma: Secondary | ICD-10-CM

## 2019-10-20 DIAGNOSIS — M25552 Pain in left hip: Secondary | ICD-10-CM

## 2019-10-20 DIAGNOSIS — M1652 Unilateral post-traumatic osteoarthritis, left hip: Secondary | ICD-10-CM

## 2019-10-20 DIAGNOSIS — Z79891 Long term (current) use of opiate analgesic: Secondary | ICD-10-CM

## 2019-10-20 DIAGNOSIS — M5442 Lumbago with sciatica, left side: Secondary | ICD-10-CM

## 2019-10-20 DIAGNOSIS — Z0289 Encounter for other administrative examinations: Secondary | ICD-10-CM

## 2019-10-20 DIAGNOSIS — I25118 Atherosclerotic heart disease of native coronary artery with other forms of angina pectoris: Secondary | ICD-10-CM

## 2019-10-20 MED ORDER — HYDROCODONE-ACETAMINOPHEN 10-325 MG PO TABS: 1.0000 | ORAL_TABLET | ORAL | 0 refills | Status: DC | PRN
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
## 2019-11-06 ENCOUNTER — Encounter: Payer: Self-pay | Admitting: Cardiology

## 2019-11-06 ENCOUNTER — Other Ambulatory Visit: Payer: Self-pay

## 2019-11-06 ENCOUNTER — Ambulatory Visit (INDEPENDENT_AMBULATORY_CARE_PROVIDER_SITE_OTHER): Payer: Medicare Other | Admitting: Cardiology

## 2019-11-06 VITALS — BP 150/60 | HR 66 | Ht 61.0 in | Wt 135.5 lb

## 2019-11-06 DIAGNOSIS — I2510 Atherosclerotic heart disease of native coronary artery without angina pectoris: Secondary | ICD-10-CM | POA: Diagnosis not present

## 2019-11-06 DIAGNOSIS — I10 Essential (primary) hypertension: Secondary | ICD-10-CM | POA: Diagnosis not present

## 2019-11-06 MED ORDER — NITROGLYCERIN 0.4 MG SL SUBL: 0.4000 mg | SUBLINGUAL_TABLET | SUBLINGUAL | 3 refills | Status: DC | PRN

## 2019-11-06 NOTE — Patient Instructions (Signed)
Medication Instructions:  Your physician recommends that you continue on your current medications as directed. Please refer to the Current Medication list given to you today.   Nitro-glycerin has been refilled today.  *If you need a refill on your cardiac medications before your next appointment, please call your pharmacy*   Lab Work: None ordered If you have labs (blood work) drawn today and your tests are completely normal, you will receive your results only by: [name] MyChart Message (if you have MyChart) OR . A paper copy in the mail If you have any lab test that is abnormal or we need to change your treatment, we will call you to review the results.   Testing/Procedures: None ordered   Follow-Up: At [facility], you and your health needs are our priority.  As part of our continuing mission to provide you with exceptional heart care, we have created designated Provider Care Teams.  These Care Teams include your primary Cardiologist (physician) and Advanced Practice Providers (APPs -  Physician Assistants and Nurse Practitioners) who all work together to provide you with the care you need, when you need it.  We recommend signing up for the patient portal called "MyChart".  Sign up information is provided on this After Visit Summary.  MyChart is used to connect with patients for Virtual Visits (Telemedicine).  Patients are able to view lab/test results, encounter notes, upcoming appointments, etc.  Non-urgent messages can be sent to your provider as well.   To learn more about what you can do with MyChart, go to [email or web address].    Your next appointment:   6 month(s)  The format for your next appointment:   In Person  Provider:    You may see Debbe Odea, MD or one of the following Advanced Practice Providers on your designated Care Team:    Nicolasa Ducking, NP  Eula Listen, PA-C  Marisue Ivan, PA-C    Other Instructions N/a

## 2019-11-06 NOTE — Progress Notes (Signed)
Cardiology Office Note:    Date:  11/06/2019   ID:  Elizabeth [name], DOB 1940/09/22, MRN [id number]  PCP:  Danelle Berry, PA-C  Cardiologist:  Debbe Odea, MD  Electrophysiologist:  None   Referring MD: Danelle Berry, PA-C   Chief Complaint  Patient presents with  . Other    3 month follow up. Meds reviewed verbally with patient.     History of Present Illness:    Elizabeth [name] is a 79 y.o. 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  She was last seen with elevated blood pressure.  Hydralazine was added to her blood pressure regimen.  She denied any cardiac symptoms of chest pain or shortness of breath.  Has not used her sublingual nitroglycerin. her last echocardiogram on 05/2018 showed normal ejection fraction with EF 60 to 65%.  Checks her blood pressure daily at home and systolic is usually 120s to 130s.  She has not used hydralazine as needed.  Historical notes Patient used to be seen by Dr. Gery Pray at [facility].  Patient wanted to establish care at the [facility] practice.  In October 2019, she underwent a left heart cath showing multivessel disease but was not seen as a candidate for revascularization.  Medical therapy was recommended.  The plan is if she develops symptoms in the future, high risk PCI may be considered.  She is being followed by vascular surgery for management of AAA, carotid and peripheral vascular disease.     Past Medical History:  Diagnosis Date  . 3-vessel CAD    05/2018 LHC with a single remaining conduit which is the left main coronary artery supplying the LAD.  Both the circumflex and right coronary artery were occluded.  The distal left main, proximal LAD is aneurysmal and heavily calcified.  The LAD supplies well-formed collaterals to the PDA.  Not felt to be candidate for  revascularization and with recommendation for medical mgmt  . AAA (abdominal aortic aneurysm) (HCC)    4.3cm (12/2018)  . Celiac artery stenosis (HCC)   . Degenerative joint disease of left hip   . Heart failure with preserved ejection fraction (HCC)    EF 60-65%, 05/2018  . History of non-ST elevation myocardial infarction (NSTEMI)    05/2018 with subsequent LHC and in the setting of acute aortic ulcer and hematoma  . Hyperlipidemia   . Hypertension   . Internal carotid artery stenosis, left    80-99% 05/2018  . Intramural hematoma of thoracic aorta (HCC)    05/2018  . Mitral regurgitation    Mild by 05/2018 echo  . Paroxysmal SVT (supraventricular tachycardia) (HCC)    05/2018  . Peripheral vascular disease (HCC)    extensive vascular dz s/p b/l carotid endarterectomies (2014) and iliac stenting, AAA, aortic ulcer  . Right renal artery stenosis (HCC)    05/2018  . Smoking hx    Quit ~9937  . Status post bilateral carotid endarterectomy    Followed by Vascular surgery with most recent images 05/2018 showing L ICA 80-99% stenosis  . Subclavian artery stenosis, left (HCC)    05/2018    Past Surgical History:  Procedure Laterality Date  . APPENDECTOMY    . CARDIAC CATHETERIZATION    . HIP SURGERY Left    x3  . LEFT HEART CATH AND CORONARY ANGIOGRAPHY N/A 05/19/2018   Procedure: LEFT HEART  CATH AND CORONARY ANGIOGRAPHY;  Surgeon: Lyn RecordsSmith, Henry W, MD;  [facility];  Service: Cardiovascular;  Laterality: N/A;  . PERIPHERAL VASCULAR CATHETERIZATION Right 02/02/2016   Procedure: Lower Extremity Angiography;  Surgeon: Annice NeedyJason S Dew, MD;  Location: [facility] INVASIVE CV LAB;  Service: Cardiovascular;  Laterality: Right;    Current Medications: Current Meds  Medication Sig  . amLODipine (NORVASC) 10 MG tablet Take 1 tablet (10 mg total) by mouth daily.  . Aspirin-Acetaminophen-Caffeine (GOODY HEADACHE PO) Take 1 packet by mouth daily as needed (headache).  .  chlorthalidone (HYGROTON) 25 MG tablet Take 1 tablet (25 mg total) by mouth daily.  . cholecalciferol (VITAMIN D) 1000 units tablet Take 1,000 Units by mouth daily.  . clopidogrel (PLAVIX) 75 MG tablet Take 1 tablet (75 mg total) by mouth daily.  . Cyanocobalamin (VITAMIN B-12 PO) Take 1 tablet by mouth daily.  . hydrALAZINE (APRESOLINE) 50 MG tablet Take 1 tablet (50 mg total) by mouth 3 (three) times daily between meals as needed (bp high).  [name]. HYDROcodone-acetaminophen (NORCO) 10-325 MG tablet Take 1 tablet by mouth every 4 (four) hours as needed for severe pain.  Melene Muller. [START ON 11/20/2019] HYDROcodone-acetaminophen (NORCO) 10-325 MG tablet Take 1 tablet by mouth every 4 (four) hours as needed for severe pain.  . isosorbide mononitrate (IMDUR) 30 MG 24 hr tablet Take 0.5 tablets (15 mg total) by mouth daily.  [name]. lisinopril (ZESTRIL) 40 MG tablet Take 1 tablet (40 mg total) by mouth daily.  . metoprolol tartrate (LOPRESSOR) 100 MG tablet Take 1 tablet (100 mg total) by mouth 2 (two) times daily.  . Multiple Vitamin (MULTIVITAMIN) tablet Take 1 tablet by mouth daily.  . nitroGLYCERIN (NITROSTAT) 0.4 MG SL tablet Place 1 tablet (0.4 mg total) under the tongue every 5 (five) minutes as needed for chest pain.  . potassium citrate (UROCIT-K) 10 MEQ (1080 MG) SR tablet Take 10 mEq by mouth 2 (two) times daily.  . rosuvastatin (CRESTOR) 20 MG tablet Take 1 tablet (20 mg total) by mouth at bedtime.  . [DISCONTINUED] nitroGLYCERIN (NITROSTAT) 0.4 MG SL tablet Place 1 tablet (0.4 mg total) under the tongue every 5 (five) minutes as needed for chest pain.     Allergies:   Codeine and Sulfa antibiotics   Social History   Socioeconomic History  . Marital status: Widowed    Spouse name: Not on file  . Number of children: 2  . Years of education: Not on file  . Highest education level: GED or equivalent  Occupational History  . Occupation: diabled  Tobacco Use  . Smoking status: Former Smoker    Quit  date: 02/02/1999    Years since quitting: 20.7  . Smokeless tobacco: Never Used  Substance and Sexual Activity  . Alcohol use: No  . Drug use: No  . Sexual activity: Not Currently  Other Topics Concern  . Not on file  Social History Narrative  . Not on file   Social Determinants of Health   Financial Resource Strain: Low Risk   . Difficulty of Paying Living Expenses: Not hard at all  Food Insecurity: No Food Insecurity  . Worried About Programme researcher, broadcasting/film/videounning Out of Food in the Last Year: Never true  . Ran Out of Food in the Last Year: Never true  Transportation Needs: No Transportation Needs  . Lack of Transportation (Medical): No  . Lack of Transportation (Non-Medical): No  Physical Activity: Inactive  . Days of Exercise per Week: 0 days  .  Minutes of Exercise per Session: 0 min  Stress: No Stress Concern Present  . Feeling of Stress : Only a little  Social Connections: Somewhat Isolated  . Frequency of Communication with Friends and Family: More than three times a week  . Frequency of Social Gatherings with Friends and Family: More than three times a week  . Attends Religious Services: More than 4 times per year  . Active Member of Clubs or Organizations: No  . Attends Banker Meetings: Never  . Marital Status: Widowed     Family History: The patient's family history includes Heart disease in her son; Hodgkin's lymphoma in her son; Hyperlipidemia in her sister; Hypertension in her father, mother, and sister; Kidney disease in her son; Stroke in her father and mother. There is no history of Mental illness.  ROS:   Please see the history of present illness.     All other systems reviewed and are negative.  EKGs/Labs/Other Studies Reviewed:    The following studies were reviewed today: 05/2018 LHC  Total occlusion of the mid to distal RCA. Distal RCA fills via well-formed collaterals around the left ventricular apex from the LAD.  Total occlusion of a relatively small  circumflex. Minimal collaterals are noted.  Ostial 60% left main with pressure damping noted during engagement. At least 60% distal left main is noted.  Heavily calcified 80% proximal LAD followed by eccentric calcified slitlike 70% mid LAD stenosis.  Normal LV function with EF 55%.  The patient has a single remaining conduit which is the left main coronary artery supplying the LAD. Both the circumflex and right coronary are occluded. The distal left main, proximal LAD is aneurysmal and heavily calcified. The LAD supplies well-formed collaterals to the PDA.  Recommend evaluation for potential surgical revascularization.  No appealing interventional options exist.  Carotid US 05/19/2018  Summary: Right Carotid: There is no evidence of stenosis in the right ICA. Patent CEA.  Left Carotid: Velocities in the left ICA are consistent with a 80-99% stenosis. Significant acoustic shadowing and the bifurcation.  Vertebrals: Bilateral vertebral arteries demonstrate antegrade flow. Subclavians: Normal flow hemodynamics were seen in bilateral subclavian arteries. *See table(s) above for measurements and observations.  Echo 05/20/18: Study Conclusions - Left ventricle: The cavity size was normal. Wall thickness was normal. Systolic function was normal. The estimated ejection fraction was in the range of60% to 65%. Hypokinesisof the mid-apicalinferolateral myocardium; consistent with ischemia in the distribution of the right coronary or left circumflex coronary artery. Doppler parameters are consistent with abnormal left ventricular relaxation (grade 1 diastolic dysfunction). - Mitral valve: There was mild regurgitation.  EKG:  EKG is  ordered today.  The ekg ordered today demonstrates normal sinus rhythm, occasional PVCs  Recent Labs: 10/02/2019: ALT 13; BUN 23; Creat 1.00; Potassium 4.1; Sodium 142  Recent Lipid Panel    Component Value  Date/Time   CHOL 162 10/02/2019 0917   TRIG 201 (H) 10/02/2019 0917   HDL 42 (L) 10/02/2019 0917   CHOLHDL 3.9 10/02/2019 0917   VLDL 28 05/20/2018 0249   LDLCALC 90 10/02/2019 0917    Physical Exam:    VS:  BP (!) 150/60 (BP Location: Right Arm, Patient Position: Sitting, Cuff Size: Normal)   Pulse 66   Ht 5\' 1"  (1.549 m)   Wt 135 lb 8 oz (61.5 kg)   SpO2 96%   BMI 25.60 kg/m     Wt Readings from Last 3 Encounters:  11/06/19 135 lb 8 oz (61.5  kg)  09/16/19 127 lb (57.6 kg)  08/03/19 125 lb (56.7 kg)     GEN:  Well nourished, well developed in no acute distress HEENT: Normal NECK: No JVD; bilateral carotid bruits noted LYMPHATICS: No lymphadenopathy CARDIAC: RRR, 2/6 systolic murmur at right upper sternal border RESPIRATORY:  Clear to auscultation without rales, wheezing or rhonchi  ABDOMEN: Soft, non-tender, non-distended MUSCULOSKELETAL:  No edema; No deformity  SKIN: Warm and dry NEUROLOGIC:  Alert and oriented x 3 PSYCHIATRIC:  Normal affect   ASSESSMENT:   . 1. Essential hypertension   2. Coronary artery disease involving native coronary artery of native heart without angina pectoris    PLAN:    In order of problems listed above:  1. Patient with history of hypertension.  BP elevated in the office today.  Home blood pressure numbers have been within normal limits.  Patient likely has a component of whitecoat hypertension.  Continue Imdur 15 daily,continue Norvasc 10 mg daily, lisinopril 40 mg daily, Lopressor 50 mg twice daily.  chlorthalidone 25 mg daily.  She was advised to use hydralazine 50mg  3 times daily if blood pressure stays over 160.  Keep blood pressure log 2. Continue dual antiplatelet therapy with aspirin Plavix.  Continue Crestor 20 mg daily.  Imdur 30 mg daily.  Last ejection fraction was normal with grade 1 diastolic dysfunction.  Refills for Nitrostat as needed sent as her prior was expired  Follow-up in 6 month.  This note was generated in  part or whole with voice recognition software. Voice recognition is usually quite accurate but there are transcription errors that can and very often do occur. I apologize for any typographical errors that were not detected and corrected.  Medication Adjustments/Labs and Tests Ordered: Current medicines are reviewed at length with the patient today.  Concerns regarding medicines are outlined above.  Orders Placed This Encounter  Procedures  . EKG 12-Lead   Meds ordered this encounter  Medications  . nitroGLYCERIN (NITROSTAT) 0.4 MG SL tablet    Sig: Place 1 tablet (0.4 mg total) under the tongue every 5 (five) minutes as needed for chest pain.    Dispense:  25 tablet    Refill:  3    Patient Instructions  Medication Instructions:  Your physician recommends that you continue on your current medications as directed. Please refer to the Current Medication list given to you today.   Nitro-glycerin has been refilled today.  *If you need a refill on your cardiac medications before your next appointment, please call your pharmacy*   Lab Work: None ordered If you have labs (blood work) drawn today and your tests are completely normal, you will receive your results only by: MyChart Message (if you have MyChart) OR . A paper copy in the mail If you have any lab test that is abnormal or we need to change your treatment, we will call you to review the results.   Testing/Procedures: None ordered   Follow-Up: At [facility], you and your health needs are our priority.  As part of our continuing mission to provide you with exceptional heart care, we have created designated Provider Care Teams.  These Care Teams include your primary Cardiologist (physician) and Advanced Practice Providers (APPs -  Physician Assistants and Nurse Practitioners) who all work together to provide you with the care you need, when you need it.  We recommend signing up for the patient portal called "MyChart".   Sign up information is provided on this After Visit  Summary.  MyChart is used to connect with patients for Virtual Visits (Telemedicine).  Patients are able to view lab/test results, encounter notes, upcoming appointments, etc.  Non-urgent messages can be sent to your provider as well.   To learn more about what you can do with MyChart, go to [email or web address].    Your next appointment:   6 month(s)  The format for your next appointment:   In Person  Provider:    You may see Kate Sable, MD or one of the following Advanced Practice Providers on your designated Care Team:    Murray Hodgkins, NP  Christell Faith, PA-C  Marrianne Mood, PA-C    Other Instructions N/a     Signed, Kate Sable, MD  11/06/2019 3:31 PM    [place]

## 2019-11-10 ENCOUNTER — Other Ambulatory Visit: Payer: Self-pay | Admitting: Family Medicine

## 2019-11-10 DIAGNOSIS — I10 Essential (primary) hypertension: Secondary | ICD-10-CM

## 2019-11-10 DIAGNOSIS — I25118 Atherosclerotic heart disease of native coronary artery with other forms of angina pectoris: Secondary | ICD-10-CM

## 2019-11-19 ENCOUNTER — Other Ambulatory Visit: Payer: Self-pay

## 2019-11-19 DIAGNOSIS — I714 Abdominal aortic aneurysm, without rupture, unspecified: Secondary | ICD-10-CM

## 2019-11-20 ENCOUNTER — Other Ambulatory Visit: Payer: Self-pay | Admitting: Family Medicine

## 2019-11-20 DIAGNOSIS — I25118 Atherosclerotic heart disease of native coronary artery with other forms of angina pectoris: Secondary | ICD-10-CM

## 2019-11-20 DIAGNOSIS — I10 Essential (primary) hypertension: Secondary | ICD-10-CM

## 2019-11-26 ENCOUNTER — Ambulatory Visit: Payer: Medicare Other

## 2019-11-26 ENCOUNTER — Other Ambulatory Visit: Payer: Self-pay

## 2019-12-21 ENCOUNTER — Ambulatory Visit
Admission: RE | Admit: 2019-12-21 | Discharge: 2019-12-21 | Disposition: A | Discharge status: Home / Self Care | Payer: Medicare Other | Source: Ambulatory Visit | Attending: Vascular Surgery | Admitting: Vascular Surgery

## 2019-12-21 DIAGNOSIS — I714 Abdominal aortic aneurysm, without rupture, unspecified: Secondary | ICD-10-CM

## 2019-12-21 MED ORDER — IOPAMIDOL (ISOVUE-370) INJECTION 76%
60.0000 mL | Freq: Once | INTRAVENOUS | Status: AC | PRN
  Administered 2019-12-21: 60 mL via INTRAVENOUS

## 2019-12-22 ENCOUNTER — Encounter: Payer: Self-pay | Admitting: Student in an Organized Health Care Education/Training Program

## 2019-12-22 ENCOUNTER — Ambulatory Visit
Payer: Medicare Other | Attending: Student in an Organized Health Care Education/Training Program | Admitting: Student in an Organized Health Care Education/Training Program

## 2019-12-22 ENCOUNTER — Other Ambulatory Visit: Payer: Self-pay

## 2019-12-22 VITALS — BP 139/72 | HR 66 | Temp 97.7°F | Resp 16 | Ht 68.0 in | Wt 130.0 lb

## 2019-12-22 DIAGNOSIS — M5442 Lumbago with sciatica, left side: Secondary | ICD-10-CM | POA: Diagnosis not present

## 2019-12-22 DIAGNOSIS — G8921 Chronic pain due to trauma: Secondary | ICD-10-CM | POA: Diagnosis not present

## 2019-12-22 DIAGNOSIS — G8929 Other chronic pain: Secondary | ICD-10-CM | POA: Diagnosis not present

## 2019-12-22 DIAGNOSIS — M25552 Pain in left hip: Secondary | ICD-10-CM | POA: Diagnosis not present

## 2019-12-22 DIAGNOSIS — M1652 Unilateral post-traumatic osteoarthritis, left hip: Secondary | ICD-10-CM

## 2019-12-22 DIAGNOSIS — I25118 Atherosclerotic heart disease of native coronary artery with other forms of angina pectoris: Secondary | ICD-10-CM | POA: Diagnosis not present

## 2019-12-22 DIAGNOSIS — I739 Peripheral vascular disease, unspecified: Secondary | ICD-10-CM

## 2019-12-22 DIAGNOSIS — Z9889 Other specified postprocedural states: Secondary | ICD-10-CM

## 2019-12-22 MED ORDER — HYDROCODONE-ACETAMINOPHEN 10-325 MG PO TABS: 1.0000 | ORAL_TABLET | ORAL | 0 refills | Status: DC | PRN
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
## 2019-12-25 ENCOUNTER — Encounter: Payer: Self-pay | Admitting: Vascular Surgery

## 2019-12-25 ENCOUNTER — Other Ambulatory Visit: Payer: Self-pay

## 2019-12-25 ENCOUNTER — Ambulatory Visit (INDEPENDENT_AMBULATORY_CARE_PROVIDER_SITE_OTHER): Payer: Medicare Other | Admitting: Vascular Surgery

## 2019-12-25 VITALS — BP 143/79 | HR 61 | Temp 97.2°F | Resp 14 | Ht 60.0 in | Wt 129.0 lb

## 2019-12-25 DIAGNOSIS — I7101 Dissection of thoracic aorta: Secondary | ICD-10-CM

## 2019-12-25 DIAGNOSIS — I739 Peripheral vascular disease, unspecified: Secondary | ICD-10-CM | POA: Diagnosis not present

## 2019-12-25 DIAGNOSIS — I71019 Dissection of thoracic aorta, unspecified: Secondary | ICD-10-CM

## 2019-12-25 DIAGNOSIS — I714 Abdominal aortic aneurysm, without rupture, unspecified: Secondary | ICD-10-CM

## 2019-12-25 NOTE — Progress Notes (Signed)
Patient ID: Elizabeth [name], female   DOB: 1940-09-13, 79 y.o.   MRN: [id number]  Reason for Consult: AAA   Referred by Delsa Grana, PA-C  Subjective:     HPI:  Elizabeth [name] is a 79 y.o. female has a history of type B IMH as well as abdominal aortic aneurysm.  Has undergone bilateral carotid artery endarterectomies as well as stenting of her left common iliac artery this was done in [place].  She has not followed up for those at this time did have a CT scan which did not corroborate a previously seen left ICA stenosis of 80 to 99%.  She follows up with CT scan today.  She has had both vaccinations.  Past Medical History:  Diagnosis Date  . 3-vessel CAD    05/2018 LHC with a single remaining conduit which is the left main coronary artery supplying the LAD.  Both the circumflex and right coronary artery were occluded.  The distal left main, proximal LAD is aneurysmal and heavily calcified.  The LAD supplies well-formed collaterals to the PDA.  Not felt to be candidate for revascularization and with recommendation for medical mgmt  . AAA (abdominal aortic aneurysm) ([place])    4.3cm (12/2018)  . Celiac artery stenosis ([place])   . Degenerative joint disease of left hip   . Heart failure with preserved ejection fraction ([place])    EF 60-65%, 05/2018  . History of non-ST elevation myocardial infarction (NSTEMI)    05/2018 with subsequent LHC and in the setting of acute aortic ulcer and hematoma  . Hyperlipidemia   . Hypertension   . Internal carotid artery stenosis, left    80-99% 05/2018  . Intramural hematoma of thoracic aorta ([place])    05/2018  . Mitral regurgitation    Mild by 05/2018 echo  . Paroxysmal SVT (supraventricular tachycardia) ([place])    05/2018  . Peripheral vascular disease ([place])    extensive vascular dz s/p b/l carotid endarterectomies (2014) and iliac stenting, AAA, aortic ulcer  . Right renal artery stenosis ([place])    05/2018  . Smoking hx    Quit ~6546  . Status  post bilateral carotid endarterectomy    Followed by Vascular surgery with most recent images 05/2018 showing L ICA 80-99% stenosis  . Subclavian artery stenosis, left ([place])    05/2018   Family History  Problem Relation Age of Onset  . Hypertension Mother   . Stroke Mother   . Hypertension Father   . Stroke Father   . Hypertension Sister   . Hyperlipidemia Sister   . Hodgkin's lymphoma Son   . Heart disease Son   . Kidney disease Son   . Mental illness Neg Hx    Past Surgical History:  Procedure Laterality Date  . APPENDECTOMY    . CARDIAC CATHETERIZATION    . HIP SURGERY Left    x3  . LEFT HEART CATH AND CORONARY ANGIOGRAPHY N/A 05/19/2018   Procedure: LEFT HEART CATH AND CORONARY ANGIOGRAPHY;  Surgeon: Belva Crome, MD;  Location: [facility];  Service: Cardiovascular;  Laterality: N/A;  . PERIPHERAL VASCULAR CATHETERIZATION Right 02/02/2016   Procedure: Lower Extremity Angiography;  Surgeon: Algernon Huxley, MD;  Location: [facility];  Service: Cardiovascular;  Laterality: Right;    Short Social History:  Social History   Tobacco Use  . Smoking status: Former Smoker    Quit date: 02/02/1999    Years since quitting: 20.9  . Smokeless tobacco:  Never Used  Substance Use Topics  . Alcohol use: No    Allergies  Allergen Reactions  . Codeine Other (See Comments)    Reaction: Unsure  . Sulfa Antibiotics Other (See Comments)    Mouth blisters    Current Outpatient Medications  Medication Sig Dispense Refill  . amLODipine (NORVASC) 10 MG tablet TAKE 1 TABLET BY MOUTH EVERY DAY 90 tablet 1  . Aspirin-Acetaminophen-Caffeine (GOODY HEADACHE PO) Take 1 packet by mouth daily as needed (headache).    . cholecalciferol (VITAMIN D) 1000 units tablet Take 1,000 Units by mouth daily.    . clopidogrel (PLAVIX) 75 MG tablet Take 1 tablet (75 mg total) by mouth daily. 30 tablet 12  . Cyanocobalamin (VITAMIN B-12 PO) Take 1 tablet by mouth daily.    . hydrALAZINE  (APRESOLINE) 50 MG tablet Take 1 tablet (50 mg total) by mouth 3 (three) times daily between meals as needed (bp high). 270 tablet 1  . HYDROcodone-acetaminophen (NORCO) 10-325 MG tablet Take 1 tablet by mouth every 4 (four) hours as needed for severe pain. Must last 30 days. 180 tablet 0  . [START ON 01/21/2020] HYDROcodone-acetaminophen (NORCO) 10-325 MG tablet Take 1 tablet by mouth every 4 (four) hours as needed for severe pain. Must last 30 days. 180 tablet 0  . [START ON 02/20/2020] HYDROcodone-acetaminophen (NORCO) 10-325 MG tablet Take 1 tablet by mouth every 4 (four) hours as needed for severe pain. Must last 30 days. 180 tablet 0  . lisinopril (ZESTRIL) 40 MG tablet TAKE 1 TABLET BY MOUTH EVERY DAY 90 tablet 1  . metoprolol tartrate (LOPRESSOR) 100 MG tablet Take 1 tablet (100 mg total) by mouth 2 (two) times daily. 180 tablet 3  . Multiple Vitamin (MULTIVITAMIN) tablet Take 1 tablet by mouth daily.    . nitroGLYCERIN (NITROSTAT) 0.4 MG SL tablet Place 1 tablet (0.4 mg total) under the tongue every 5 (five) minutes as needed for chest pain. 25 tablet 3  . potassium citrate (UROCIT-K) 10 MEQ (1080 MG) SR tablet Take 10 mEq by mouth 2 (two) times daily.  12  . rosuvastatin (CRESTOR) 20 MG tablet Take 1 tablet (20 mg total) by mouth at bedtime. 90 tablet 1  . chlorthalidone (HYGROTON) 25 MG tablet Take 1 tablet (25 mg total) by mouth daily. 30 tablet 5  . isosorbide mononitrate (IMDUR) 30 MG 24 hr tablet TAKE 0.5 TABLETS (15 MG TOTAL) BY MOUTH DAILY. 45 tablet 1   No current facility-administered medications for this visit.    Review of Systems  Constitutional:  Constitutional negative. HENT: HENT negative.  Eyes: Eyes negative.  Respiratory: Respiratory negative.  Cardiovascular: Cardiovascular negative.  GI: Gastrointestinal negative.  Skin: Skin negative.  Neurological: Neurological negative. Hematologic: Hematologic/lymphatic negative.  Psychiatric: Psychiatric negative.          Objective:  Objective   Vitals:   12/25/19 1001  BP: (!) 143/79  Pulse: 61  Resp: 14  Temp: (!) 97.2 F (36.2 C)  TempSrc: Temporal  SpO2: 95%  Weight: 129 lb (58.5 kg)  Height: 5' (1.524 m)   Body mass index is 25.19 kg/m.  Physical Exam HENT:     Nose:     Comments: Mask in place    Mouth/Throat:     Mouth: Mucous membranes are moist.  Eyes:     Pupils: Pupils are equal, round, and reactive to light.  Cardiovascular:     Pulses: Normal pulses.  Abdominal:     Palpations: Abdomen is soft.  Comments: Palpable aneurysm  Musculoskeletal:        General: No swelling. Normal range of motion.     Cervical back: Normal range of motion.  Skin:    General: Skin is warm and dry.     Capillary Refill: Capillary refill takes less than 2 seconds.  Neurological:     General: No focal deficit present.     Mental Status: She is alert.  Psychiatric:        Mood and Affect: Mood normal.        Behavior: Behavior normal.        Thought Content: Thought content normal.        Judgment: Judgment normal.     Data: CTA IMPRESSION: 1. 4.6 cm infrarenal aortic aneurysm (previously 4.3) without complicating features. Aortic aneurysm NOS (ICD10-I71.9). Recommend followup by abdomen and pelvis CTA in 6 months, and vascular surgery referral/consultation if not already obtained. This recommendation follows ACR consensus guidelines: White Paper of the ACR Incidental Findings Committee II on Vascular Findings. J Am Coll Radiol 2013; 10:789-794. 2. 3.7 cm fusiform dilatation of the mid descending thoracic aorta associated with a broad ulcerated plaque. Recommend annual imaging followup by CTA or MRA. This recommendation follows 2010 ACCF/AHA/AATS/ACR/ASA/SCA/SCAI/SIR/STS/SVM Guidelines for the Diagnosis and Management of Patients with Thoracic Aortic Disease. Circulation.2010; 121: X323-F573 3. Proximal occlusion of left subclavian artery , reconstituted distally. The vertebral  artery arises separately from the arch. 4. 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 5. New nodular cluster in the lingula, presumably of infectious/inflammatory etiology. Recommend attention on follow-up. 6. Descending and sigmoid diverticulosis. 7. Small hiatal hernia. 8. Left inguinal hernia containing mostly fat and a 1.3 cm peripherally calcified lesion. 9. Coronary calcifications. The severity of coronary artery disease and any potential stenosis cannot be assessed on this non-gated CT examination.   I reviewed her CT scan.  Patient did not want to see her CT scan today.      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  From a thoracic standpoint she can have a CT scan in 1 year.  Before this we will get repeat duplex of her abdominal aorta of her abdomen and see her in 6 months.  She will also need to have her carotid arteries evaluated in 6 months.     Maeola Harman MD [facility]

## 2019-12-29 ENCOUNTER — Other Ambulatory Visit: Payer: Self-pay | Admitting: *Deleted

## 2019-12-29 ENCOUNTER — Other Ambulatory Visit: Payer: Self-pay | Admitting: Family Medicine

## 2019-12-29 DIAGNOSIS — E785 Hyperlipidemia, unspecified: Secondary | ICD-10-CM

## 2019-12-29 DIAGNOSIS — I714 Abdominal aortic aneurysm, without rupture, unspecified: Secondary | ICD-10-CM

## 2019-12-29 DIAGNOSIS — I708 Atherosclerosis of other arteries: Secondary | ICD-10-CM

## 2019-12-29 DIAGNOSIS — R0989 Other specified symptoms and signs involving the circulatory and respiratory systems: Secondary | ICD-10-CM

## 2019-12-29 DIAGNOSIS — I739 Peripheral vascular disease, unspecified: Secondary | ICD-10-CM

## 2019-12-29 DIAGNOSIS — I7103 Dissection of thoracoabdominal aorta: Secondary | ICD-10-CM

## 2019-12-31 ENCOUNTER — Other Ambulatory Visit: Payer: Self-pay | Admitting: Family Medicine

## 2019-12-31 DIAGNOSIS — I714 Abdominal aortic aneurysm, without rupture, unspecified: Secondary | ICD-10-CM

## 2019-12-31 DIAGNOSIS — I7103 Dissection of thoracoabdominal aorta: Secondary | ICD-10-CM

## 2019-12-31 DIAGNOSIS — I708 Atherosclerosis of other arteries: Secondary | ICD-10-CM

## 2019-12-31 DIAGNOSIS — E785 Hyperlipidemia, unspecified: Secondary | ICD-10-CM

## 2019-12-31 DIAGNOSIS — I739 Peripheral vascular disease, unspecified: Secondary | ICD-10-CM

## 2020-01-05 ENCOUNTER — Other Ambulatory Visit: Payer: Self-pay | Admitting: Family Medicine

## 2020-01-05 DIAGNOSIS — I7103 Dissection of thoracoabdominal aorta: Secondary | ICD-10-CM

## 2020-01-05 DIAGNOSIS — I714 Abdominal aortic aneurysm, without rupture, unspecified: Secondary | ICD-10-CM

## 2020-01-05 DIAGNOSIS — I739 Peripheral vascular disease, unspecified: Secondary | ICD-10-CM

## 2020-01-05 DIAGNOSIS — I708 Atherosclerosis of other arteries: Secondary | ICD-10-CM

## 2020-01-05 DIAGNOSIS — E785 Hyperlipidemia, unspecified: Secondary | ICD-10-CM

## 2020-01-20 IMAGING — CT CT ANGIO CHEST-ABD-PELV FOR DISSECTION W/ AND WO/W CM
2 of 7 series · 12 of 46 positions shown, 14 images · IV contrast (APPLIED)
Comparison: CT abdomen pelvis dated January 01, 2011.

CLINICAL DATA: Right-sided back pain radiating around to the right
upper abdomen. History of aortic aneurysm.

EXAM:
CT ANGIOGRAPHY CHEST, ABDOMEN AND PELVIS
TECHNIQUE: Multidetector CT imaging through the chest, abdomen and pelvis was
performed using the standard protocol during bolus administration of
intravenous contrast. Multiplanar reconstructed images and MIPs were
obtained and reviewed to evaluate the vascular anatomy.
CONTRAST:  75mL OMNIPAQUE IOHEXOL 350 MG/ML SOLN

[Series 6: axial arterial · axial · arterial · 0.82mm/px · z∈[-1166,-662]mm · 9 of 196 slices shown, 11 images]
[im 14/196  soft-tissue]
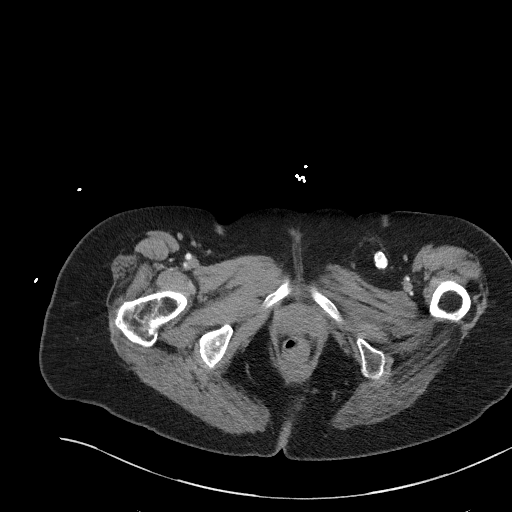
[im 14/196  bone]
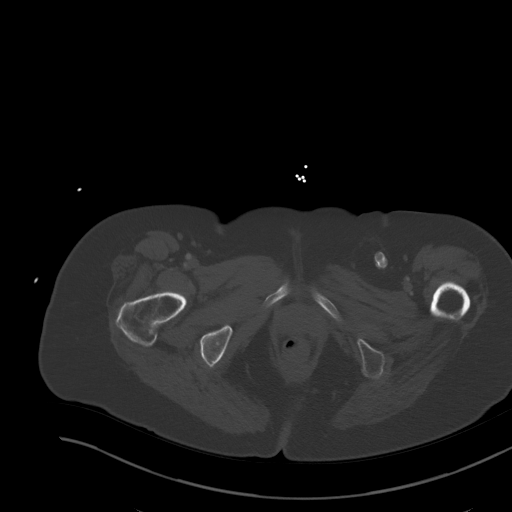
[im 42/196  soft-tissue]
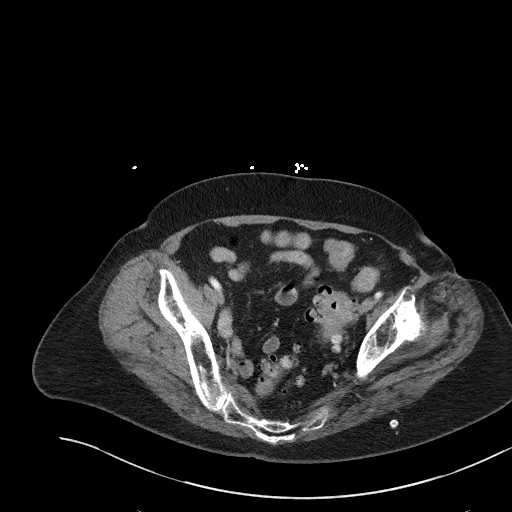
[im 56/196  soft-tissue]
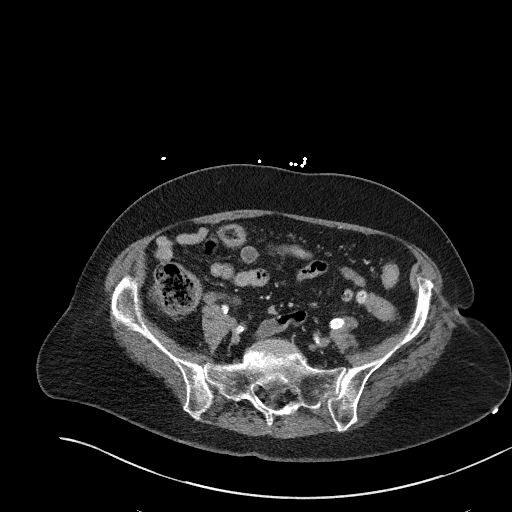
[im 84/196  soft-tissue]
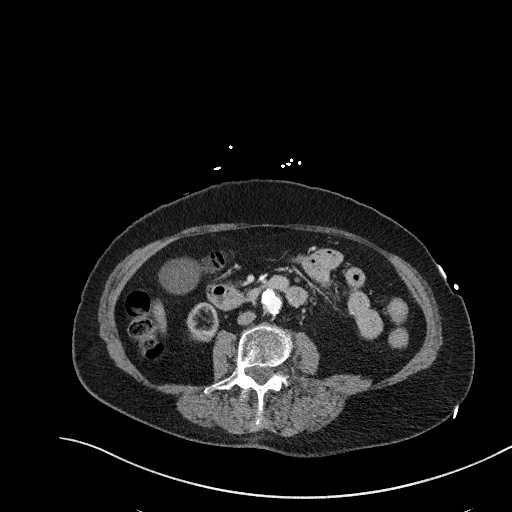
[im 98/196  soft-tissue]
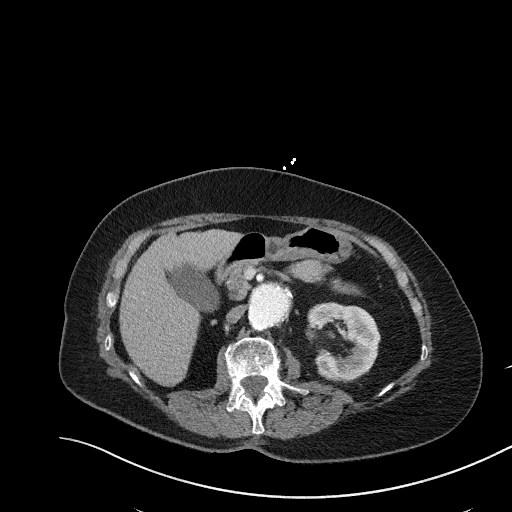
[im 112/196  soft-tissue]
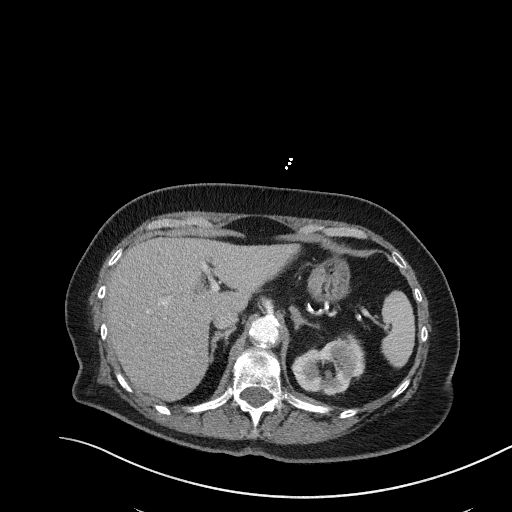
[im 140/196  soft-tissue]
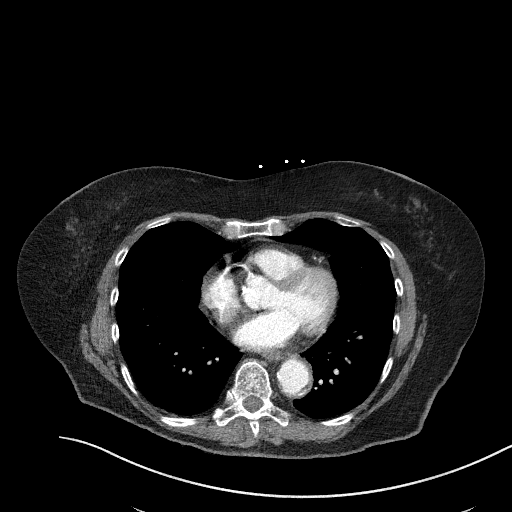
[im 154/196  soft-tissue]
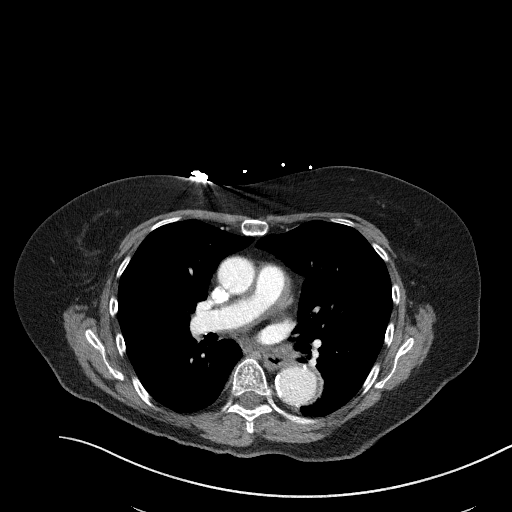
[im 182/196  soft-tissue]
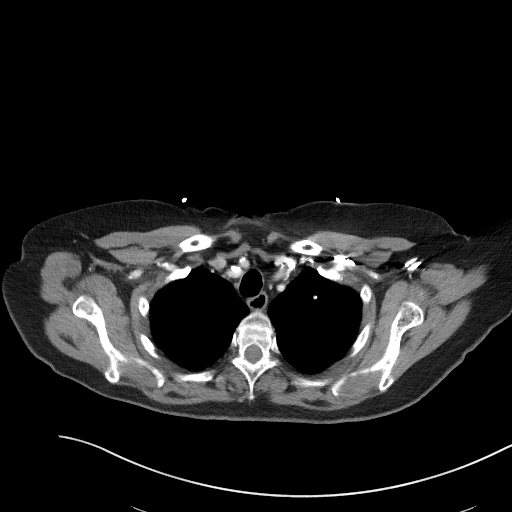
[im 182/196  bone]
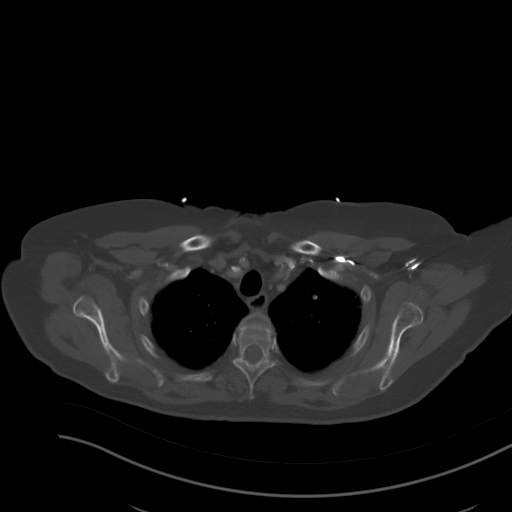

[Series 8: coronals · coronal · 0.79mm/px · 3 of 129 slices shown]
[im 33/129  soft-tissue]
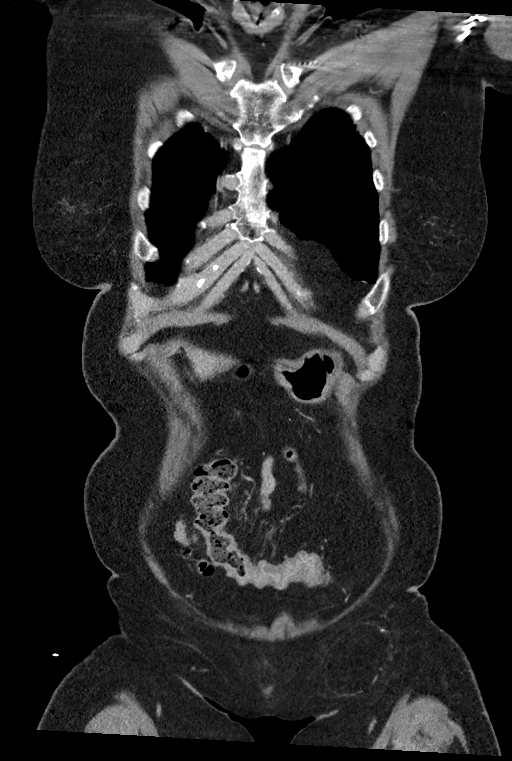
[im 65/129  soft-tissue]
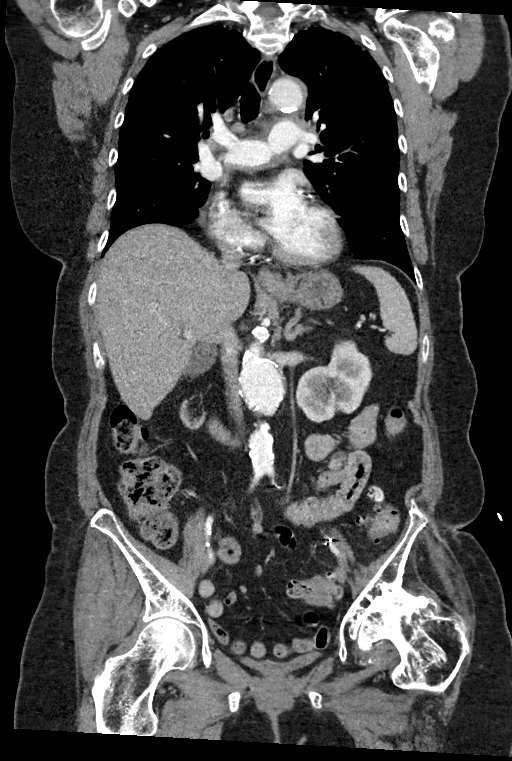
[im 97/129  soft-tissue]
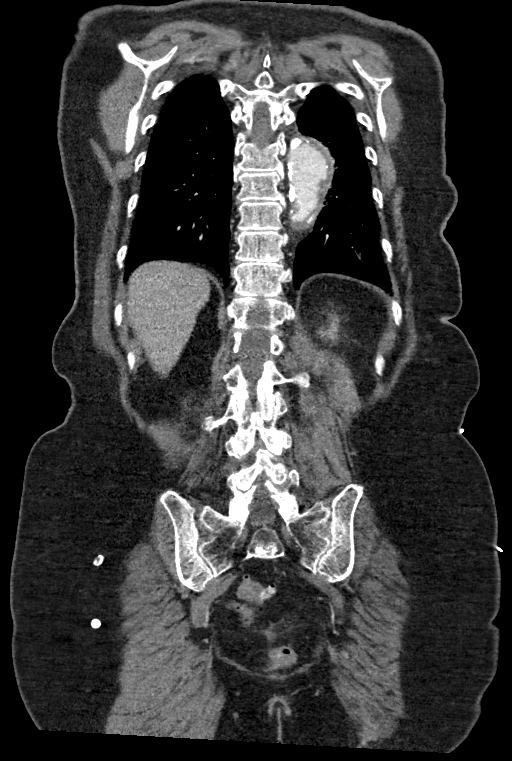

[12 of 46 positions shown; findings below may reference images not displayed]

FINDINGS: CTA CHEST FINDINGS

Cardiovascular: There is crescentic high density along the left
aspect of the distal descending thoracic aorta adjacent to a 12 mm
penetrating ulcer. Additional crescentic high density along the left
aspect of the proximal descending aorta with smaller 5 mm
penetrating ulcer along the medial proximal descending aorta just
beyond the arch (series 8, image 77). No definite S section.

Ectasia of the mid descending thoracic aorta measuring up to 3.9 cm.
Coronary, aortic arch, and branch vessel atherosclerotic vascular
disease. Mild stenosis of the proximal left common carotid artery.
Severe stenosis of the left subclavian artery origin.

Intermediate density thickening along the central pulmonary
arteries.

Normal heart size. No pericardial effusion. No central pulmonary
embolism.

Mediastinum/Nodes: No enlarged mediastinal, hilar, or axillary lymph
nodes. Thyroid gland, trachea, and esophagus demonstrate no
significant findings.

Lungs/Pleura: No focal consolidation, pleural effusion, or
pneumothorax. Calcified granuloma in the left upper lobe. No
suspicious pulmonary nodule.

Musculoskeletal: No chest wall abnormality. No acute or significant
osseous findings.

Review of the MIP images confirms the above findings.

CTA ABDOMEN AND PELVIS FINDINGS

VASCULAR

Aorta: Interval enlargement of the infrarenal abdominal aortic
aneurysm, now measuring 4.0 cm, previously 2.8 cm. Extensive
atherosclerotic calcification. No dissection or significant
stenosis.

Celiac: Mild stenosis of the origin due to calcific atherosclerosis.
No aneurysm, dissection, or vasculitis.

SMA: Moderate stenosis of the origin due to calcific
atherosclerosis. No aneurysm, dissection, or vasculitis.

Renals: Severe stenosis of the right renal artery origin with
diminutive right renal artery. Moderate stenosis of the left renal
artery origin due to calcific atherosclerosis. No aneurysm,
dissection, or vasculitis.

IMA: Patent without evidence of aneurysm, dissection, vasculitis or
significant stenosis.

Inflow: Left common and external iliac stent with severe focal
stenosis at the distal stent. No aneurysm, dissection, or
vasculitis.

Veins: No obvious venous abnormality within the limitations of this
arterial phase study.

Review of the MIP images confirms the above findings.

NON-VASCULAR

Hepatobiliary: No focal liver abnormality is seen. No gallstones,
gallbladder wall thickening, or biliary dilatation.

Pancreas: Unremarkable. No pancreatic ductal dilatation or
surrounding inflammatory changes.

Spleen: Normal in size without focal abnormality.

Adrenals/Urinary Tract: The adrenal glands are unremarkable.
Progressive now severe atrophy of the right kidney. Small
nonobstructive left renal calculi. Small cyst in the left lower
pole. No hydronephrosis. The bladder is decompressed.

Stomach/Bowel: Stomach is within normal limits. Appendix is
surgically absent. No evidence of bowel wall thickening, distention,
or inflammatory changes. Moderate sigmoid diverticulosis.

Lymphatic: No enlarged abdominal or pelvic lymph nodes.

Reproductive: Uterus and bilateral adnexa are unremarkable.

Other: Unchanged moderate fat containing left inguinal hernia.
Probable small focal area of calcified fat necrosis in the inferior
aspect of the hernia is unchanged. No free fluid or
pneumoperitoneum.

Musculoskeletal: No acute or significant osseous findings. Atrophy
of the left gluteus muscles. Unchanged severe left hip
osteoarthritis with deformity of the femoral head and acetabulum.

Review of the MIP images confirms the above findings.
IMPRESSION: Vascular:

1. 5 mm proximal descending thoracic aorta and 12 mm distal
descending thoracic aorta penetrating ulcers with adjacent
intramural hematoma. No dissection.
2. Intermediate density thickening of the central pulmonary arteries
could reflect a small amount of mediastinal hematoma versus possibly
wall thickening related to underlying vasculitis.
3. Ectasia of the mid descending thoracic aorta measuring up to
cm.
4. Interval enlargement of the infrarenal abdominal aortic aneurysm,
now measuring 4.0 cm. Recommend followup by ultrasound in 1 year.
This recommendation follows ACR consensus guidelines: White Paper of
the ACR Incidental Findings Committee II on Vascular Findings. [HOSPITAL] 7168; [DATE].
5. Severe stenosis of the left subclavian artery origin. Mild
stenosis of the proximal left common carotid artery.
6. Mild stenosis of the celiac artery origin. Moderate stenosis of
the SMA and left renal artery origins.
7. Severe stenosis of the right renal artery with progressive now
severe atrophy of the right kidney.
8. Left common and external iliac stent with severe focal stenosis
of the external iliac artery just distal to the stent.

Other:

1. Nonobstructive left nephrolithiasis.
2. Moderate fat containing left inguinal hernia.

These results were called by telephone at the time of interpretation
on 05/17/2018 at [DATE] to Dr. BENRABAH ETOIL , who verbally
acknowledged these results.

## 2020-02-19 ENCOUNTER — Telehealth: Payer: Self-pay | Admitting: Family Medicine

## 2020-02-19 NOTE — Chronic Care Management (AMB) (Signed)
  Chronic Care Management   Note  02/19/2020 Name: Elizabeth [name] MRN: [id number] DOB: 12-Jul-1941  SONDI DESCH is a 79 y.o. year old female who is a primary care patient of Delsa Grana, [place]. I reached out to Welford Roche by phone today in response to a referral sent by Ms. Peggyann Shoals Snedeker's health plan.     Ms. Burkard was given information about Chronic Care Management services today including:  1. CCM service includes personalized support from designated clinical staff supervised by her physician, including individualized plan of care and coordination with other care providers 2. 24/7 contact phone numbers for assistance for urgent and routine care needs. 3. Service will only be billed when office clinical staff spend 20 minutes or more in a month to coordinate care. 4. Only one practitioner may furnish and bill the service in a calendar month. 5. The patient may stop CCM services at any time (effective at the end of the month) by phone call to the office staff. 6. The patient will be responsible for cost sharing (co-pay) of up to 20% of the service fee (after annual deductible is met).  Patient did not agree to enrollment in care management services and does not wish to consider at this time.  Follow up plan: The patient has been provided with contact information for the care management team and has been advised to call with any health related questions or concerns.   Noreene Larsson, [place], [place], [place] [address] Direct Dial: [phone] [email or web address] Website: [email or web address]

## 2020-02-22 ENCOUNTER — Other Ambulatory Visit: Payer: Self-pay

## 2020-02-22 MED ORDER — CHLORTHALIDONE 25 MG PO TABS: 25.0000 mg | ORAL_TABLET | Freq: Every day | ORAL | 5 refills | Status: DC

## 2020-03-04 ENCOUNTER — Telehealth: Payer: Self-pay | Admitting: Cardiology

## 2020-03-04 ENCOUNTER — Other Ambulatory Visit: Payer: Self-pay

## 2020-03-04 DIAGNOSIS — I10 Essential (primary) hypertension: Secondary | ICD-10-CM

## 2020-03-04 DIAGNOSIS — I25118 Atherosclerotic heart disease of native coronary artery with other forms of angina pectoris: Secondary | ICD-10-CM

## 2020-03-04 MED ORDER — HYDRALAZINE HCL 50 MG PO TABS: 50.0000 mg | ORAL_TABLET | Freq: Two times a day (BID) | ORAL | 2 refills | Status: DC

## 2020-03-04 NOTE — Telephone Encounter (Signed)
Pt c/o BP issue: STAT if pt c/o blurred vision, one-sided weakness or slurred speech  1. What are your last 5 BP readings?  Last night 189/100 after medication 155/90, then 111/63 , then 125/71 Today - 165/88 2. Are you having any other symptoms (ex. Dizziness, headache, blurred vision, passed out)? Heart "going real fast", headache  3. What is your BP issue? elevated

## 2020-03-04 NOTE — Telephone Encounter (Signed)
Spoke with Dr. Azucena Cecil and he recommended the patient start taking her Hydralazine 50mg  BID instead of the TID prn.  I called patient back and informed her of this change and she verbalized understanding and agreed with plan. I informed her that I will check in with her on Monday 8/2, and she was very grateful for that.

## 2020-03-04 NOTE — Telephone Encounter (Signed)
Spoke with patient and she informed me she has taken 2 doses of her hydralazine today. One this AM and then one at 2:30 this afternoon. At 3:45 her BP was 165/88 and her heart rate was 86. Her heart rate was at 95 earlier today. She states her Heart rate usually runs between 65-70.   Yesterday she took 2 doses of the Hydralazine in total, and prior to that she has only needed 1-2 doses daily. I advised her that she could take her 3rd dose for today. I will inform Dr. Azucena Cecil and ask for any additional recommendations.  Below office note from Dr. Azucena Cecil on 11/06/19.  1. Patient with history of hypertension.  BP elevated in the office today.  Home blood pressure numbers have been within normal limits.  Patient likely has a component of whitecoat hypertension.  Continue Imdur 15 daily,continue Norvasc 10 mg daily, lisinopril 40 mg daily, Lopressor 50 mg twice daily.  chlorthalidone 25 mg daily.  She was advised to use hydralazine 50mg  3 times daily if blood pressure stays over 160.  Keep blood pressure log

## 2020-03-04 NOTE — Progress Notes (Signed)
Changed Hydralazine to 50mg  BID per Dr. (see telephone encounter)

## 2020-03-07 NOTE — Telephone Encounter (Signed)
Spoke with patient. She stated she had a better weekend. Her blood pressure was WNL, except for Sunday afternoon it was 165 over "something". Today it was 121/67 so she did not take a Hydralazine. She did take it twice a day as advised all weekend. I advised she should take it twice a day as recommended by Dr. Azucena Cecil, and going forward to only take her BP once a day at the same time each day, unless she has symptoms like a headache or racing heart that she felt last week.  She also stated that she was worried about taking all of her BP medications at once in the AM, because sometimes her Systolic BP can be 99. I informed her she could take her Hydralazine a couple hours after morning meds and then right before bed.  I told her I would check in with her in 3 days on Thursday8/5, and she was very appreciative. Patient verbalized understanding and agreed with plan.

## 2020-03-11 ENCOUNTER — Telehealth: Payer: Self-pay | Admitting: Cardiology

## 2020-03-11 NOTE — Telephone Encounter (Signed)
Elizabeth [name] calling in to see if Dr. Myriam Forehand would prescribe her anything for her stress levels. Elizabeth [name] states she is under a lot of stress and this increases her BP which Elizabeth [name] is concerned about  Please advise

## 2020-03-13 ENCOUNTER — Other Ambulatory Visit: Payer: Self-pay

## 2020-03-13 DIAGNOSIS — I160 Hypertensive urgency: Principal | ICD-10-CM | POA: Insufficient documentation

## 2020-03-13 DIAGNOSIS — I25118 Atherosclerotic heart disease of native coronary artery with other forms of angina pectoris: Secondary | ICD-10-CM | POA: Insufficient documentation

## 2020-03-13 DIAGNOSIS — I499 Cardiac arrhythmia, unspecified: Secondary | ICD-10-CM | POA: Diagnosis not present

## 2020-03-13 DIAGNOSIS — Z7982 Long term (current) use of aspirin: Secondary | ICD-10-CM | POA: Diagnosis not present

## 2020-03-13 DIAGNOSIS — Z87891 Personal history of nicotine dependence: Secondary | ICD-10-CM | POA: Insufficient documentation

## 2020-03-13 DIAGNOSIS — Z79891 Long term (current) use of opiate analgesic: Secondary | ICD-10-CM | POA: Insufficient documentation

## 2020-03-13 DIAGNOSIS — Z79899 Other long term (current) drug therapy: Secondary | ICD-10-CM | POA: Insufficient documentation

## 2020-03-13 DIAGNOSIS — I509 Heart failure, unspecified: Secondary | ICD-10-CM | POA: Insufficient documentation

## 2020-03-13 DIAGNOSIS — I110 Hypertensive heart disease with heart failure: Secondary | ICD-10-CM | POA: Diagnosis not present

## 2020-03-13 DIAGNOSIS — R748 Abnormal levels of other serum enzymes: Secondary | ICD-10-CM | POA: Diagnosis not present

## 2020-03-13 DIAGNOSIS — I714 Abdominal aortic aneurysm, without rupture: Secondary | ICD-10-CM | POA: Insufficient documentation

## 2020-03-13 DIAGNOSIS — Z743 Need for continuous supervision: Secondary | ICD-10-CM | POA: Diagnosis not present

## 2020-03-13 DIAGNOSIS — R002 Palpitations: Secondary | ICD-10-CM | POA: Diagnosis not present

## 2020-03-13 DIAGNOSIS — R000 Tachycardia, unspecified: Secondary | ICD-10-CM | POA: Insufficient documentation

## 2020-03-13 DIAGNOSIS — R0902 Hypoxemia: Secondary | ICD-10-CM | POA: Diagnosis not present

## 2020-03-13 DIAGNOSIS — Z20822 Contact with and (suspected) exposure to covid-19: Secondary | ICD-10-CM | POA: Diagnosis not present

## 2020-03-13 DIAGNOSIS — R6889 Other general symptoms and signs: Secondary | ICD-10-CM | POA: Diagnosis not present

## 2020-03-13 DIAGNOSIS — I10 Essential (primary) hypertension: Secondary | ICD-10-CM | POA: Diagnosis not present

## 2020-03-13 LAB — BASIC METABOLIC PANEL
Anion gap: 10 (ref 5–15)
BUN: 38 mg/dL — ABNORMAL HIGH (ref 8–23)
CO2: 23 mmol/L (ref 22–32)
Calcium: 9.6 mg/dL (ref 8.9–10.3)
Chloride: 103 mmol/L (ref 98–111)
Creatinine, Ser: 1.13 mg/dL — ABNORMAL HIGH (ref 0.44–1.00)
GFR calc Af Amer: 54 mL/min — ABNORMAL LOW (ref >60)
GFR calc non Af Amer: 47 mL/min — ABNORMAL LOW (ref >60)
Glucose, Bld: 121 mg/dL — ABNORMAL HIGH (ref 70–99)
Potassium: 5.1 mmol/L (ref 3.5–5.1)
Sodium: 136 mmol/L (ref 135–145)

## 2020-03-13 LAB — CBC
HCT: 45.9 % (ref 36.0–46.0)
Hemoglobin: 15.4 g/dL — ABNORMAL HIGH (ref 12.0–15.0)
MCH: 34.5 pg — ABNORMAL HIGH (ref 26.0–34.0)
MCHC: 33.6 g/dL (ref 30.0–36.0)
MCV: 102.7 fL — ABNORMAL HIGH (ref 80.0–100.0)
Platelets: 262 10*3/uL (ref 150–400)
RBC: 4.47 MIL/uL (ref 3.87–5.11)
RDW: 11.7 % (ref 11.5–15.5)
WBC: 10 10*3/uL (ref 4.0–10.5)
nRBC: 0 % (ref 0.0–0.2)

## 2020-03-13 LAB — TROPONIN I (HIGH SENSITIVITY)
Troponin I (High Sensitivity): 14 ng/L (ref <18)
Troponin I (High Sensitivity): 28 ng/L — ABNORMAL HIGH (ref <18)

## 2020-03-13 NOTE — ED Provider Notes (Signed)
MSE was initiated and I personally evaluated the patient and placed orders (if any) at  4:54 PM on March 13, 2020.  The patient appears stable so that the remainder of the MSE may be completed by another provider.   Chinita Pester, FNP 03/13/20 1656    Sharyn Creamer, MD 03/14/20 0005

## 2020-03-13 NOTE — ED Triage Notes (Signed)
Pt states that she has had trouble with her HR and BP since Friday- pt tried to get in touch with pcp but no one called her back- pt states her HR go tup to 120 at home- pt states she checks her bp 3 times a day at home

## 2020-03-13 NOTE — ED Notes (Signed)
Pt outside with family.

## 2020-03-14 ENCOUNTER — Observation Stay
Admission: EM | Admit: 2020-03-14 | Discharge: 2020-03-14 | Disposition: A | Discharge status: Home / Self Care | Payer: Medicare Other | Attending: Internal Medicine | Admitting: Internal Medicine

## 2020-03-14 ENCOUNTER — Telehealth: Payer: Self-pay | Admitting: Cardiology

## 2020-03-14 DIAGNOSIS — I160 Hypertensive urgency: Secondary | ICD-10-CM | POA: Diagnosis present

## 2020-03-14 DIAGNOSIS — I10 Essential (primary) hypertension: Secondary | ICD-10-CM

## 2020-03-14 DIAGNOSIS — R778 Other specified abnormalities of plasma proteins: Secondary | ICD-10-CM

## 2020-03-14 DIAGNOSIS — I714 Abdominal aortic aneurysm, without rupture, unspecified: Secondary | ICD-10-CM | POA: Diagnosis present

## 2020-03-14 DIAGNOSIS — R002 Palpitations: Secondary | ICD-10-CM | POA: Diagnosis present

## 2020-03-14 DIAGNOSIS — R7989 Other specified abnormal findings of blood chemistry: Secondary | ICD-10-CM

## 2020-03-14 DIAGNOSIS — I2510 Atherosclerotic heart disease of native coronary artery without angina pectoris: Secondary | ICD-10-CM

## 2020-03-14 DIAGNOSIS — R000 Tachycardia, unspecified: Secondary | ICD-10-CM

## 2020-03-14 DIAGNOSIS — I25118 Atherosclerotic heart disease of native coronary artery with other forms of angina pectoris: Secondary | ICD-10-CM

## 2020-03-14 LAB — SARS CORONAVIRUS 2 BY RT PCR (HOSPITAL ORDER, PERFORMED IN ~~LOC~~ HOSPITAL LAB): SARS Coronavirus 2: NEGATIVE

## 2020-03-14 LAB — TSH: TSH: 2.276 u[IU]/mL (ref 0.350–4.500)

## 2020-03-14 MED ORDER — VITAMIN D 25 MCG (1000 UNIT) PO TABS
1000.0000 [IU] | ORAL_TABLET | Freq: Every day | ORAL | Status: DC
  Administered 2020-03-14: 1000 [IU] via ORAL
  Filled 2020-03-14: qty 1

## 2020-03-14 MED ORDER — METOPROLOL TARTRATE 50 MG PO TABS
100.0000 mg | ORAL_TABLET | Freq: Two times a day (BID) | ORAL | Status: DC
  Administered 2020-03-14: 100 mg via ORAL
  Filled 2020-03-14: qty 2

## 2020-03-14 MED ORDER — ISOSORBIDE MONONITRATE ER 30 MG PO TB24
15.0000 mg | ORAL_TABLET | Freq: Every day | ORAL | Status: DC
  Administered 2020-03-14: 15 mg via ORAL
  Filled 2020-03-14: qty 1

## 2020-03-14 MED ORDER — HYDROCODONE-ACETAMINOPHEN 5-325 MG PO TABS
1.0000 | ORAL_TABLET | ORAL | Status: DC | PRN
  Administered 2020-03-14: 2 via ORAL
  Filled 2020-03-14: qty 2

## 2020-03-14 MED ORDER — ENOXAPARIN SODIUM 40 MG/0.4ML ~~LOC~~ SOLN
40.0000 mg | SUBCUTANEOUS | Status: DC
  Administered 2020-03-14: 40 mg via SUBCUTANEOUS
  Filled 2020-03-14: qty 0.4

## 2020-03-14 MED ORDER — NITROGLYCERIN 0.4 MG SL SUBL: 0.4000 mg | SUBLINGUAL_TABLET | SUBLINGUAL | Status: DC | PRN

## 2020-03-14 MED ORDER — AMLODIPINE BESYLATE 5 MG PO TABS
10.0000 mg | ORAL_TABLET | Freq: Every day | ORAL | Status: DC
  Filled 2020-03-14: qty 2

## 2020-03-14 MED ORDER — ACETAMINOPHEN 650 MG RE SUPP: 650.0000 mg | Freq: Four times a day (QID) | RECTAL | Status: DC | PRN

## 2020-03-14 MED ORDER — ACETAMINOPHEN 325 MG PO TABS: 650.0000 mg | ORAL_TABLET | Freq: Four times a day (QID) | ORAL | Status: DC | PRN

## 2020-03-14 MED ORDER — LORAZEPAM 0.5 MG PO TABS: 0.5000 mg | ORAL_TABLET | Freq: Three times a day (TID) | ORAL | 0 refills | Status: AC | PRN

## 2020-03-14 MED ORDER — ROSUVASTATIN CALCIUM 5 MG PO TABS
5.0000 mg | ORAL_TABLET | Freq: Every day | ORAL | Status: DC
  Filled 2020-03-14: qty 1

## 2020-03-14 MED ORDER — LABETALOL HCL 5 MG/ML IV SOLN
10.0000 mg | Freq: Once | INTRAVENOUS | Status: AC
  Administered 2020-03-14: 10 mg via INTRAVENOUS
  Filled 2020-03-14: qty 4

## 2020-03-14 MED ORDER — CLOPIDOGREL BISULFATE 75 MG PO TABS
75.0000 mg | ORAL_TABLET | Freq: Every day | ORAL | Status: DC
  Administered 2020-03-14: 75 mg via ORAL
  Filled 2020-03-14: qty 1

## 2020-03-14 MED ORDER — METOPROLOL TARTRATE 100 MG PO TABS: 100.0000 mg | ORAL_TABLET | Freq: Three times a day (TID) | ORAL | 3 refills | Status: DC

## 2020-03-14 MED ORDER — LISINOPRIL 10 MG PO TABS
40.0000 mg | ORAL_TABLET | Freq: Every day | ORAL | Status: DC
  Administered 2020-03-14: 40 mg via ORAL
  Filled 2020-03-14: qty 4

## 2020-03-14 MED ORDER — POTASSIUM CITRATE ER 10 MEQ (1080 MG) PO TBCR
10.0000 meq | EXTENDED_RELEASE_TABLET | Freq: Two times a day (BID) | ORAL | Status: DC
  Administered 2020-03-14: 10 meq via ORAL
  Filled 2020-03-14 (×2): qty 1

## 2020-03-14 MED ORDER — NITROGLYCERIN 2 % TD OINT
0.5000 [in_us] | TOPICAL_OINTMENT | Freq: Once | TRANSDERMAL | Status: AC
  Administered 2020-03-14: 0.5 [in_us] via TOPICAL
  Filled 2020-03-14: qty 1

## 2020-03-14 MED ORDER — CHLORTHALIDONE 25 MG PO TABS
25.0000 mg | ORAL_TABLET | Freq: Every day | ORAL | Status: DC
  Administered 2020-03-14: 25 mg via ORAL
  Filled 2020-03-14: qty 1

## 2020-03-14 NOTE — Telephone Encounter (Signed)
Patients son calling in stating that patient has been in the ED since last night for elevated BP, HR and troponins. Son states BP was 180/100 upon entering the ED last night and HR was over 100.  Patients son would like her to be seen in ED before she leaves, patient is expected to dc today

## 2020-03-14 NOTE — H&P (Signed)
History and Physical    Elizabeth [name]:[id number] DOB: March 17, 1941 DOA: 03/14/2020  PCP: Danelle Berry, PA-C   Patient coming from: Home  I have personally briefly reviewed patient's old medical records in [facility]  Chief Complaint: Palpitations  HPI: Elizabeth [name] is a 79 y.o. female with medical history significant for HTN, triple-vessel CAD being managed medically, AAA, who presents to the emergency room with a complaint of elevated blood pressures as well as palpitations.  Patient is on multiple antihypertensives and has been having problems at home adjusting her blood pressure medications to keep her systolic blood pressure below 811 has advised by her vascular doctors given her AAA.  She states that she has been using hydralazine but she has been experiencing low blood pressure drops with the hydralazine as low as 90/60 which causes her to feel lightheaded.  She states that she gets very tachycardic when this happens and it causes a lot of anxiety.  On her own she has cut back on the dose of hydralazine and decided to stop it altogether 2 days prior to coming in.  She continues to check her blood pressure at home and the systolic has been in the 180s and she continues to be tachycardic with heart rate remaining over 100.  She says the numbers get her anxious and it makes the tachycardia worse and she feels discomfort in her chest when it happens.  For this reason she decided to come to the emergency room.  Patient has been doing telephone visits with her doctors and managing the medication over the phone but she feels like she is not getting at night ED Course: On arrival to the emergency room first documented systolic blood pressure was 183 however the ER provider said it was close to 200 and she got a dose of labetalol.  At the time of my evaluation systolic blood pressure was around 140.  Heart rate was in the 90s.  Troponin was 14>28.  EKG showed sinus rhythm with no acute ST-T  wave changes.  Hospitalist consulted for admission.  Review of Systems: As per HPI otherwise all other systems on review of systems negative.     Past Medical History:  Diagnosis Date  . 3-vessel CAD    05/2018 LHC with a single remaining conduit which is the left main coronary artery supplying the LAD.  Both the circumflex and right coronary artery were occluded.  The distal left main, proximal LAD is aneurysmal and heavily calcified.  The LAD supplies well-formed collaterals to the PDA.  Not felt to be candidate for revascularization and with recommendation for medical mgmt  . AAA (abdominal aortic aneurysm) (HCC)    4.3cm (12/2018)  . Celiac artery stenosis (HCC)   . Degenerative joint disease of left hip   . Heart failure with preserved ejection fraction (HCC)    EF 60-65%, 05/2018  . History of non-ST elevation myocardial infarction (NSTEMI)    05/2018 with subsequent LHC and in the setting of acute aortic ulcer and hematoma  . Hyperlipidemia   . Hypertension   . Internal carotid artery stenosis, left    80-99% 05/2018  . Intramural hematoma of thoracic aorta (HCC)    05/2018  . Mitral regurgitation    Mild by 05/2018 echo  . Paroxysmal SVT (supraventricular tachycardia) (HCC)    05/2018  . Peripheral vascular disease (HCC)    extensive vascular dz s/p b/l carotid endarterectomies (2014) and iliac stenting, AAA, aortic ulcer  .  Right renal artery stenosis (HCC)    05/2018  . Smoking hx    Quit ~2536  . Status post bilateral carotid endarterectomy    Followed by Vascular surgery with most recent images 05/2018 showing L ICA 80-99% stenosis  . Subclavian artery stenosis, left (HCC)    05/2018    Past Surgical History:  Procedure Laterality Date  . APPENDECTOMY    . CARDIAC CATHETERIZATION    . HIP SURGERY Left    x3  . LEFT HEART CATH AND CORONARY ANGIOGRAPHY N/A 05/19/2018   Procedure: LEFT HEART CATH AND CORONARY ANGIOGRAPHY;  Surgeon: Lyn Records, MD;  Location:  [facility];  Service: Cardiovascular;  Laterality: N/A;  . PERIPHERAL VASCULAR CATHETERIZATION Right 02/02/2016   Procedure: Lower Extremity Angiography;  Surgeon: Annice Needy, MD;  Location: [facility] INVASIVE CV LAB;  Service: Cardiovascular;  Laterality: Right;     reports that she quit smoking about 21 years ago. She has never used smokeless tobacco. She reports that she does not drink alcohol and does not use drugs.  Allergies  Allergen Reactions  . Codeine Other (See Comments)    Reaction: Unsure  . Sulfa Antibiotics Other (See Comments)    Mouth blisters    Family History  Problem Relation Age of Onset  . Hypertension Mother   . Stroke Mother   . Hypertension Father   . Stroke Father   . Hypertension Sister   . Hyperlipidemia Sister   . Hodgkin's lymphoma Son   . Heart disease Son   . Kidney disease Son   . Mental illness Neg Hx       Prior to Admission medications   Medication Sig Start Date End Date Taking? Authorizing Provider  acetaminophen (TYLENOL) 500 MG tablet Take 1,000 mg by mouth 2 (two) times daily as needed for moderate pain.   Yes [provider]  amLODipine (NORVASC) 10 MG tablet TAKE 1 TABLET BY MOUTH EVERY DAY 11/20/19  Yes Danelle Berry, PA-C  chlorthalidone (HYGROTON) 25 MG tablet Take 1 tablet (25 mg total) by mouth daily. 02/22/20 05/22/20 Yes Agbor-Etang, Arlys John, MD  cholecalciferol (VITAMIN D) 1000 units tablet Take 1,000 Units by mouth daily.   Yes [provider]  clopidogrel (PLAVIX) 75 MG tablet Take 1 tablet (75 mg total) by mouth daily. 06/16/19  Yes Danelle Berry, PA-C  Cyanocobalamin (VITAMIN B-12 PO) Take 1 tablet by mouth daily.   Yes [provider]  glucosamine-chondroitin 500-400 MG tablet Take 1 tablet by mouth daily.   Yes [provider]  hydrALAZINE (APRESOLINE) 50 MG tablet Take 1 tablet (50 mg total) by mouth in the morning and at bedtime. 03/04/20  Yes Agbor-Etang, Arlys John, MD    HYDROcodone-acetaminophen (NORCO) 10-325 MG tablet Take 1 tablet by mouth every 4 (four) hours as needed for severe pain. Must last 30 days. 02/20/20 03/21/20 Yes Edward Jolly, MD  isosorbide mononitrate (IMDUR) 30 MG 24 hr tablet TAKE 0.5 TABLETS (15 MG TOTAL) BY MOUTH DAILY. 11/10/19 03/14/20 Yes Danelle Berry, PA-C  lisinopril (ZESTRIL) 40 MG tablet TAKE 1 TABLET BY MOUTH EVERY DAY 11/20/19  Yes Danelle Berry, PA-C  metoprolol tartrate (LOPRESSOR) 100 MG tablet Take 1 tablet (100 mg total) by mouth 2 (two) times daily. 06/16/19  Yes Danelle Berry, PA-C  Multiple Vitamin (MULTIVITAMIN) tablet Take 1 tablet by mouth daily.   Yes [provider]  nitroGLYCERIN (NITROSTAT) 0.4 MG SL tablet Place 1 tablet (0.4 mg total) under the tongue every 5 (five)  minutes as needed for chest pain. 11/06/19 03/14/20 Yes Agbor-Etang, Arlys John, MD  potassium citrate (UROCIT-K) 10 MEQ (1080 MG) SR tablet Take 10 mEq by mouth 2 (two) times daily. 01/04/16  Yes [provider]  rosuvastatin (CRESTOR) 20 MG tablet TAKE 1 TABLET BY MOUTH EVERYDAY AT BEDTIME 01/05/20  Yes Danelle Berry, PA-C  Aspirin-Acetaminophen-Caffeine (GOODY HEADACHE PO) Take 1 packet by mouth daily as needed (headache). Patient not taking: Reported on 03/14/2020    [provider]    Physical Exam: Vitals:   03/14/20 0130 03/14/20 0200 03/14/20 0230 03/14/20 0300  BP: (!) 161/78 (!) 150/71 (!) 153/73 (!) 162/75  Pulse: 82 90 94 91  Resp: 14 14 14 14   Temp:      TempSrc:      SpO2: 99% 96% 95% 98%  Weight:      Height:         Vitals:   03/14/20 0130 03/14/20 0200 03/14/20 0230 03/14/20 0300  BP: (!) 161/78 (!) 150/71 (!) 153/73 (!) 162/75  Pulse: 82 90 94 91  Resp: 14 14 14 14   Temp:      TempSrc:      SpO2: 99% 96% 95% 98%  Weight:      Height:          Constitutional: Alert and oriented x 3 . Not in any apparent distress HEENT:      Head: Normocephalic and atraumatic.         Eyes: PERLA, EOMI, Conjunctivae are  normal. Sclera is non-icteric.       Mouth/Throat: Mucous membranes are moist.       Neck: Supple with no signs of meningismus. Cardiovascular: Regular rate and rhythm. No murmurs, gallops, or rubs. 2+ symmetrical distal pulses are present . No JVD. No LE edema Respiratory: Respiratory effort normal .Lungs sounds clear bilaterally. No wheezes, crackles, or rhonchi.  Gastrointestinal: Soft, non tender, and non distended with positive bowel sounds. No rebound or guarding. Genitourinary: No CVA tenderness. Musculoskeletal: Nontender with normal range of motion in all extremities. No cyanosis, or erythema of extremities. Neurologic: Normal speech and language. Face is symmetric. Moving all extremities. No gross focal neurologic deficits . Skin: Skin is warm, dry.  No rash or ulcers Psychiatric: Mood and affect are normal Speech and behavior are normal   Labs on Admission: I have personally reviewed following labs and imaging studies  CBC: Recent Labs  Lab 03/13/20 1652  WBC 10.0  HGB 15.4*  HCT 45.9  MCV 102.7*  PLT 262   Basic Metabolic Panel: Recent Labs  Lab 03/13/20 1743  NA 136  K 5.1  CL 103  CO2 23  GLUCOSE 121*  BUN 38*  CREATININE 1.13*  CALCIUM 9.6   GFR: Estimated Creatinine Clearance: 33.8 mL/min (A) (by C-G formula based on SCr of 1.13 mg/dL (H)). Liver Function Tests: No results for input(s): AST, ALT, ALKPHOS, BILITOT, PROT, ALBUMIN in the last 168 hours. No results for input(s): LIPASE, AMYLASE in the last 168 hours. No results for input(s): AMMONIA in the last 168 hours. Coagulation Profile: No results for input(s): INR, PROTIME in the last 168 hours. Cardiac Enzymes: No results for input(s): CKTOTAL, CKMB, CKMBINDEX, TROPONINI in the last 168 hours. BNP (last 3 results) No results for input(s): PROBNP in the last 8760 hours. HbA1C: No results for input(s): HGBA1C in the last 72 hours. CBG: No results for input(s): GLUCAP in the last 168 hours. Lipid  Profile: No results for input(s): CHOL, HDL, LDLCALC,  TRIG, CHOLHDL, LDLDIRECT in the last 72 hours. Thyroid Function Tests: No results for input(s): TSH, T4TOTAL, FREET4, T3FREE, THYROIDAB in the last 72 hours. Anemia Panel: No results for input(s): VITAMINB12, FOLATE, FERRITIN, TIBC, IRON, RETICCTPCT in the last 72 hours. Urine analysis:    Component Value Date/Time   COLORURINE YELLOW (A) 05/17/2018 1032   APPEARANCEUR CLEAR (A) 05/17/2018 1032   LABSPEC 1.017 05/17/2018 1032   PHURINE 7.0 05/17/2018 1032   GLUCOSEU NEGATIVE 05/17/2018 1032   HGBUR NEGATIVE 05/17/2018 1032   BILIRUBINUR NEGATIVE 05/17/2018 1032   KETONESUR NEGATIVE 05/17/2018 1032   PROTEINUR 30 (A) 05/17/2018 1032   UROBILINOGEN 0.2 01/01/2011 2129   NITRITE NEGATIVE 05/17/2018 1032   LEUKOCYTESUR NEGATIVE 05/17/2018 1032    Radiological Exams on Admission: No results found.  EKG: Independently reviewed. Interpretation : Normal sinus rhythm with no acute ST-T wave changes  Assessment/Plan 79 year old female who lives alone with medical history significant for HTN, triple-vessel CAD being managed medically, AAA, admitted with symptomatic tachycardia and elevated blood pressures    Hypertensive urgency -Systolic blood pressure reportedly 200 in the ER receiving IV labetalol with improvement -Patient is intolerant of her home hydralazine which drops her blood pressure to the 90s over 60s -Continue other home antihypertensives -Titrate home antihypertensives to goal systolic under 160 given AAA -Patient follows with cardiologist.  Can consider consult    CAD (coronary artery disease) -Patient had troponin bump from 14-28 but no acute ST-T wave changes, possibly related to demand from tachycardia, hypertension -Continue home antiplatelets, beta blockers, statins, isosorbide, nitroglycerin as needed chest pain -Consider cardiology consult    Sinus tachycardia -Patient reports tachycardia at home whenever  blood pressure bottoms out with hydralazine, likely reflex tachycardia -Cardiac monitoring.  Heart rate 70s to 90s  AAA (abdominal aortic aneurysm) (HCC) -Keep systolic under 160    DVT prophylaxis: Lovenox  Code Status: full code  Family Communication:  none  Disposition Plan: Back to previous home environment Consults called: none  Status: Observation    Andris BaumannHazel V Dima Ferrufino MD [facility]     03/14/2020, 4:18 AM

## 2020-03-14 NOTE — Progress Notes (Signed)
Patient complains of pain in left lateral chest. Describes as aching. No orders for pain meds. Notified admitting MD.

## 2020-03-14 NOTE — ED Notes (Signed)
Pt assisted to [facility] to urinate. Pt back to bed and back on cardiac monitor. Call light within reach. Pt has no further needs at this time.

## 2020-03-14 NOTE — ED Notes (Signed)
Son Carollee Herter was called and informed that pt is ready to be picked up to go home.

## 2020-03-14 NOTE — ED Notes (Signed)
Pt has family at bedside. Pt states around 3-4am she gets heart fluttering and when this happens she gets nervous and her BP goes up and then she gets more nervous. Pt states a cardiac history being treated by cardiology. Pt states she has 2 aneurysms and 3 artery blockages but they cant do surgery so she is managed by medications. Pt on bp, pulse ox and cardiac monitor.

## 2020-03-14 NOTE — ED Notes (Signed)
Pt states "I feel real jerky inside." ER provider notified

## 2020-03-14 NOTE — Discharge Summary (Signed)
Physician Discharge Summary  Elizabeth [name] UDJ:[id number] DOB: Aug 17, 1940 DOA: 03/14/2020  PCP: Danelle Berry, PA-C  Admit date: 03/14/2020 Discharge date: 03/14/2020  Admitted From: Home Disposition: Home  Recommendations for Outpatient Follow-up:  1. Follow up with PCP in 1-2 weeks 2. Keep a logbook of blood pressures at home and bring it to primary care physician's office for follow-up.   Discharge Condition: Stable CODE STATUS: Full code Diet recommendation: Low-salt diet  Discharge summary: 79 year old female with history of hypertension, coronary artery disease, abdominal aortic aneurysm managed conservatively presented to the emergency room with elevated blood pressures and high heart rate.  She has been on multiple blood pressure medications, was taking hydralazine as needed in the past and since last week has been taking hydralazine 50 mg twice a day.  She has been feeling episodic palpitations especially after taking hydralazine and also noticing her blood pressure dropped to 90/60 and feeling dizzy.  When not taking hydralazine, her blood pressure was 180s systolic that prompted her visit to the ER last night.  She was admitted by nighttime hospitalist early morning today.  Patient was seen in the emergency room admit to the hospital early morning.  Her son was at the bedside.  Patient was asymptomatic at the time of my evaluation.  Apparently, she was given 1 dose of labetalol with improvement of her blood pressures and heart rate.  We went through her medications.  We did some changes as below for her blood pressure regimen. Patient is on lisinopril 40 mg daily, that she will continue. Isosorbide dinitrate 15 mg, she takes half tablet of 30 mg sustained-release tablet was recommended by cardiology.  She can probably go up on 30 mg but I will defer this to her cardiologist. amlodipine 10 mg daily that she will continue. Patient is on metoprolol 100 mg 2 times a day, she has history  of SVT, TSH were checked and was normal.  EKG showed sinus tachycardia.  Tachycardia was probably related to hydralazine, however with her history of SVT, she can still tolerate higher doses of metoprolol.  Will change her metoprolol to 100 mg 3 times a day to better control her heart rate as well as blood pressure.  Patient is fairly stable.  Ambulated around with no recurrence of symptoms.  Went home with her son.  Suggested them to keep blood pressure log at home and bring it to primary care physician's office for follow-up.   Discharge Diagnoses:  Active Problems:   AAA (abdominal aortic aneurysm) (HCC)   Hypertensive urgency   CAD (coronary artery disease)   Sinus tachycardia   Palpitations    Discharge Instructions  Discharge Instructions    Diet - low sodium heart healthy   Complete by: As directed    Discharge instructions   Complete by: As directed    Keep a log book of your blood pressures and heart rate and bring it to doctors office on follow up.   Increase activity slowly   Complete by: As directed      Allergies as of 03/14/2020      Reactions   Codeine Other (See Comments)   Reaction: Unsure   Sulfa Antibiotics Other (See Comments)   Mouth blisters      Medication List    STOP taking these medications   GOODY HEADACHE PO   hydrALAZINE 50 MG tablet Commonly known as: APRESOLINE     TAKE these medications   acetaminophen 500 MG tablet Commonly known as: TYLENOL  Take 1,000 mg by mouth 2 (two) times daily as needed for moderate pain.   amLODipine 10 MG tablet Commonly known as: NORVASC TAKE 1 TABLET BY MOUTH EVERY DAY   chlorthalidone 25 MG tablet Commonly known as: HYGROTON Take 1 tablet (25 mg total) by mouth daily.   cholecalciferol 1000 units tablet Commonly known as: VITAMIN D Take 1,000 Units by mouth daily.   clopidogrel 75 MG tablet Commonly known as: PLAVIX Take 1 tablet (75 mg total) by mouth daily.   glucosamine-chondroitin 500-400  MG tablet Take 1 tablet by mouth daily.   HYDROcodone-acetaminophen 10-325 MG tablet Commonly known as: Norco Take 1 tablet by mouth every 4 (four) hours as needed for severe pain. Must last 30 days.   isosorbide mononitrate 30 MG 24 hr tablet Commonly known as: IMDUR TAKE 0.5 TABLETS (15 MG TOTAL) BY MOUTH DAILY. Notes to patient: 1/2 TABLETS   lisinopril 40 MG tablet Commonly known as: ZESTRIL TAKE 1 TABLET BY MOUTH EVERY DAY   LORazepam 0.5 MG tablet Commonly known as: Ativan Take 1 tablet (0.5 mg total) by mouth every 8 (eight) hours as needed for up to 2 days for anxiety.   metoprolol tartrate 100 MG tablet Commonly known as: LOPRESSOR Take 1 tablet (100 mg total) by mouth in the morning, at noon, and at bedtime. What changed: when to take this Notes to patient: Changed from twice a day to three times daily   multivitamin tablet Take 1 tablet by mouth daily.   nitroGLYCERIN 0.4 MG SL tablet Commonly known as: NITROSTAT Place 1 tablet (0.4 mg total) under the tongue every 5 (five) minutes as needed for chest pain.   potassium citrate 10 MEQ (1080 MG) SR tablet Commonly known as: UROCIT-K Take 10 mEq by mouth 2 (two) times daily.   rosuvastatin 20 MG tablet Commonly known as: CRESTOR TAKE 1 TABLET BY MOUTH EVERYDAY AT BEDTIME   VITAMIN B-12 PO Take 1 tablet by mouth daily.       Allergies  Allergen Reactions  . Codeine Other (See Comments)    Reaction: Unsure  . Sulfa Antibiotics Other (See Comments)    Mouth blisters    Consultations:  None   Procedures/Studies: No results found. (Echo, Carotid, EGD, Colonoscopy, ERCP)    Subjective: Patient was seen and examined in the emergency room.  She was admitted early morning.  On my interview patient's son was at the bedside.  We discussed different blood pressure regimen and agreed upon increasing metoprolol to 3 times a day to better control her heart rate.   Discharge Exam: Vitals:   03/14/20 0900  03/14/20 1000  BP: 133/70 138/65  Pulse: 97 70  Resp: 14 14  Temp:    SpO2: 94% 97%   Vitals:   03/14/20 0700 03/14/20 0800 03/14/20 0900 03/14/20 1000  BP: (!) 149/80 (!) 145/75 133/70 138/65  Pulse: 93 99 97 70  Resp: 16 16 14 14   Temp:  98.7 F (37.1 C)    TempSrc:  Oral    SpO2: 96% 95% 94% 97%  Weight:      Height:        General: Pt is alert, awake, not in acute distress Cardiovascular: RRR, S1/S2 +, no rubs, no gallops Respiratory: CTA bilaterally, no wheezing, no rhonchi Abdominal: Soft, NT, ND, bowel sounds + Extremities: no edema, no cyanosis    The results of significant diagnostics from this hospitalization (including imaging, microbiology, ancillary and laboratory) are listed below for reference.  Microbiology: Recent Results (from the past 240 hour(s))  SARS Coronavirus 2 by RT PCR (hospital order, performed in [facility] hospital lab) Nasopharyngeal Nasopharyngeal Swab     Status: None   Collection Time: 03/14/20  1:32 AM   Specimen: Nasopharyngeal Swab  Result Value Ref Range Status   SARS Coronavirus 2 NEGATIVE NEGATIVE Final    Comment: (NOTE) SARS-CoV-2 target nucleic acids are NOT DETECTED.  The SARS-CoV-2 RNA is generally detectable in upper and lower respiratory specimens during the acute phase of infection. The lowest concentration of SARS-CoV-2 viral copies this assay can detect is 250 copies / mL. A negative result does not preclude SARS-CoV-2 infection and should not be used as the sole basis for treatment or other patient management decisions.  A negative result may occur with improper specimen collection / handling, submission of specimen other than nasopharyngeal swab, presence of viral mutation(s) within the areas targeted by this assay, and inadequate number of viral copies (<250 copies / mL). A negative result must be combined with clinical observations, patient history, and epidemiological information.  Fact Sheet for Patients:    BoilerBrush.com.cy  Fact Sheet for Healthcare Providers: https://pope.com/  This test is not yet approved or  cleared by the [place] FDA and has been authorized for detection and/or diagnosis of SARS-CoV-2 by FDA under an Emergency Use Authorization (EUA).  This EUA will remain in effect (meaning this test can be used) for the duration of the COVID-19 declaration under Section 564(b)(1) of the Act, 21 U.S.C. section 360bbb-3(b)(1), unless the authorization is terminated or revoked sooner.  Performed at [facility], [address] [place], [place] [address]      Labs: BNP (last 3 results) No results for input(s): BNP in the last 8760 hours. Basic Metabolic Panel: Recent Labs  Lab 03/13/20 1743  NA 136  K 5.1  CL 103  CO2 23  GLUCOSE 121*  BUN 38*  CREATININE 1.13*  CALCIUM 9.6   Liver Function Tests: No results for input(s): AST, ALT, ALKPHOS, BILITOT, PROT, ALBUMIN in the last 168 hours. No results for input(s): LIPASE, AMYLASE in the last 168 hours. No results for input(s): AMMONIA in the last 168 hours. CBC: Recent Labs  Lab 03/13/20 1652  WBC 10.0  HGB 15.4*  HCT 45.9  MCV 102.7*  PLT 262   Cardiac Enzymes: No results for input(s): CKTOTAL, CKMB, CKMBINDEX, TROPONINI in the last 168 hours. BNP: Invalid input(s): POCBNP CBG: No results for input(s): GLUCAP in the last 168 hours. D-Dimer No results for input(s): DDIMER in the last 72 hours. Hgb A1c No results for input(s): HGBA1C in the last 72 hours. Lipid Profile No results for input(s): CHOL, HDL, LDLCALC, TRIG, CHOLHDL, LDLDIRECT in the last 72 hours. Thyroid function studies Recent Labs    03/13/20 2220  TSH 2.276   Anemia work up No results for input(s): VITAMINB12, FOLATE, FERRITIN, TIBC, IRON, RETICCTPCT in the last 72 hours. Urinalysis    Component Value Date/Time   COLORURINE YELLOW (A) 05/17/2018 1032   APPEARANCEUR  CLEAR (A) 05/17/2018 1032   LABSPEC 1.017 05/17/2018 1032   PHURINE 7.0 05/17/2018 1032   GLUCOSEU NEGATIVE 05/17/2018 1032   HGBUR NEGATIVE 05/17/2018 1032   BILIRUBINUR NEGATIVE 05/17/2018 1032   KETONESUR NEGATIVE 05/17/2018 1032   PROTEINUR 30 (A) 05/17/2018 1032   UROBILINOGEN 0.2 01/01/2011 2129   NITRITE NEGATIVE 05/17/2018 1032   LEUKOCYTESUR NEGATIVE 05/17/2018 1032   Sepsis Labs Invalid input(s): PROCALCITONIN,  WBC,  LACTICIDVEN Microbiology  Recent Results (from the past 240 hour(s))  SARS Coronavirus 2 by RT PCR (hospital order, performed in [facility] lab) Nasopharyngeal Nasopharyngeal Swab     Status: None   Collection Time: 03/14/20  1:32 AM   Specimen: Nasopharyngeal Swab  Result Value Ref Range Status   SARS Coronavirus 2 NEGATIVE NEGATIVE Final    Comment: (NOTE) SARS-CoV-2 target nucleic acids are NOT DETECTED.  The SARS-CoV-2 RNA is generally detectable in upper and lower respiratory specimens during the acute phase of infection. The lowest concentration of SARS-CoV-2 viral copies this assay can detect is 250 copies / mL. A negative result does not preclude SARS-CoV-2 infection and should not be used as the sole basis for treatment or other patient management decisions.  A negative result may occur with improper specimen collection / handling, submission of specimen other than nasopharyngeal swab, presence of viral mutation(s) within the areas targeted by this assay, and inadequate number of viral copies (<250 copies / mL). A negative result must be combined with clinical observations, patient history, and epidemiological information.  Fact Sheet for Patients:   BoilerBrush.com.cyhttps://www.fda.gov/media/136312/download  Fact Sheet for Healthcare Providers: https://pope.com/https://www.fda.gov/media/136313/download  This test is not yet approved or  cleared by the [place] FDA and has been authorized for detection and/or diagnosis of SARS-CoV-2 by FDA under an Emergency  Use Authorization (EUA).  This EUA will remain in effect (meaning this test can be used) for the duration of the COVID-19 declaration under Section 564(b)(1) of the Act, 21 U.S.C. section 360bbb-3(b)(1), unless the authorization is terminated or revoked sooner.  Performed at [facility], [address] [place], [place] [address]      Time coordinating discharge: 30 minutes  SIGNED:   Dorcas CarrowKuber Brayant Dorr, MD  [facility] 03/14/2020, 2:38 PM
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
## 2020-03-15 ENCOUNTER — Other Ambulatory Visit: Payer: Self-pay

## 2020-03-15 ENCOUNTER — Encounter: Payer: Self-pay | Admitting: Family Medicine

## 2020-03-15 ENCOUNTER — Ambulatory Visit (INDEPENDENT_AMBULATORY_CARE_PROVIDER_SITE_OTHER): Payer: Medicare Other | Admitting: Family Medicine

## 2020-03-15 VITALS — BP 120/78 | HR 83 | Temp 98.5°F | Resp 14 | Ht 60.0 in | Wt 127.0 lb

## 2020-03-15 DIAGNOSIS — I10 Essential (primary) hypertension: Secondary | ICD-10-CM

## 2020-03-15 DIAGNOSIS — R002 Palpitations: Secondary | ICD-10-CM

## 2020-03-15 DIAGNOSIS — I25118 Atherosclerotic heart disease of native coronary artery with other forms of angina pectoris: Secondary | ICD-10-CM

## 2020-03-15 DIAGNOSIS — Z09 Encounter for follow-up examination after completed treatment for conditions other than malignant neoplasm: Secondary | ICD-10-CM

## 2020-03-15 DIAGNOSIS — F419 Anxiety disorder, unspecified: Secondary | ICD-10-CM

## 2020-03-15 MED ORDER — BUSPIRONE HCL 5 MG PO TABS: 5.0000 mg | ORAL_TABLET | Freq: Two times a day (BID) | ORAL | 2 refills | Status: DC | PRN

## 2020-03-15 MED ORDER — ISOSORBIDE MONONITRATE ER 30 MG PO TB24: 15.0000 mg | ORAL_TABLET | Freq: Every day | ORAL | 1 refills | Status: DC

## 2020-03-15 MED ORDER — CITALOPRAM HYDROBROMIDE 10 MG PO TABS: 10.0000 mg | ORAL_TABLET | Freq: Every day | ORAL | 1 refills | Status: DC

## 2020-03-15 NOTE — Telephone Encounter (Signed)
Patient went to the ER. Son called our office. I called and spoke with son. See telephone encounter 03/14/20.
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
## 2020-03-15 NOTE — Patient Instructions (Signed)
Please follow up with Cardiology  If your heart rate is too low - in the 40's to 50's, and you feel bad (tired, short of breath, like you may pass out with a little exertion) then please take a half dose at lunch instead of the full dose (100 mg am, 50 mg midday, 100 mg pm) so that the metoprolol is not too strong - and please follow up with cardiology or follow up here.  Try taking citalopram daily to help with nerves and anxiety  You can take 1 to 2 tablets of buspar up to 2 times a day AS NEEDED for nerves/anxiety/panic symptoms.

## 2020-03-15 NOTE — Telephone Encounter (Signed)
Called and spoke with son, informed him we are not able to see patients in the ED. I also informed him that I do not have permission to speak with him on patients DPR on file.  I see in the chart that the patient is being seen by primary care today for follow up from ED for anxiety at 11:20.  We have a follow up appointment previously scheduled for 05/05/20.

## 2020-03-22 ENCOUNTER — Ambulatory Visit
Payer: Medicare Other | Attending: Student in an Organized Health Care Education/Training Program | Admitting: Student in an Organized Health Care Education/Training Program

## 2020-03-22 ENCOUNTER — Encounter: Payer: Self-pay | Admitting: Student in an Organized Health Care Education/Training Program

## 2020-03-22 ENCOUNTER — Other Ambulatory Visit: Payer: Self-pay

## 2020-03-22 VITALS — BP 148/61 | HR 67 | Temp 97.3°F | Resp 18 | Ht 60.0 in | Wt 130.0 lb

## 2020-03-22 DIAGNOSIS — Z79891 Long term (current) use of opiate analgesic: Secondary | ICD-10-CM | POA: Diagnosis not present

## 2020-03-22 DIAGNOSIS — M1652 Unilateral post-traumatic osteoarthritis, left hip: Secondary | ICD-10-CM | POA: Diagnosis not present

## 2020-03-22 DIAGNOSIS — Z87828 Personal history of other (healed) physical injury and trauma: Secondary | ICD-10-CM | POA: Insufficient documentation

## 2020-03-22 DIAGNOSIS — Z0289 Encounter for other administrative examinations: Secondary | ICD-10-CM | POA: Diagnosis not present

## 2020-03-22 DIAGNOSIS — M25552 Pain in left hip: Secondary | ICD-10-CM | POA: Insufficient documentation

## 2020-03-22 DIAGNOSIS — I739 Peripheral vascular disease, unspecified: Secondary | ICD-10-CM | POA: Insufficient documentation

## 2020-03-22 DIAGNOSIS — I25118 Atherosclerotic heart disease of native coronary artery with other forms of angina pectoris: Secondary | ICD-10-CM | POA: Diagnosis not present

## 2020-03-22 DIAGNOSIS — Z9889 Other specified postprocedural states: Secondary | ICD-10-CM | POA: Insufficient documentation

## 2020-03-22 DIAGNOSIS — G8929 Other chronic pain: Secondary | ICD-10-CM | POA: Diagnosis not present

## 2020-03-22 DIAGNOSIS — M5442 Lumbago with sciatica, left side: Secondary | ICD-10-CM

## 2020-03-22 DIAGNOSIS — G8921 Chronic pain due to trauma: Secondary | ICD-10-CM | POA: Insufficient documentation

## 2020-03-22 MED ORDER — HYDROCODONE-ACETAMINOPHEN 10-325 MG PO TABS: 1.0000 | ORAL_TABLET | ORAL | 0 refills | Status: DC | PRN

## 2020-03-22 NOTE — Progress Notes (Signed)
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 all other requirements  I discussed the assessment and treatment plan with the patient. The patient was provided an opportunity to ask questions and all were answered. The patient agreed with the plan and demonstrated an understanding of the instructions.  Patient advised to call back or seek an in-person evaluation if the symptoms or condition worsens.  Duration of encounter: 30 minutes.  Note by: Gillis Santa, MD Date: 03/22/2020; Time: 1:36 PM

## 2020-03-22 NOTE — Progress Notes (Signed)
Nursing Pain Medication Assessment:  Safety precautions to be maintained throughout the outpatient stay will include: orient to surroundings, keep bed in low position, maintain call bell within reach at all times, provide assistance with transfer out of bed and ambulation.  Medication Inspection Compliance: Elizabeth [name] did not comply with our request to bring her pills to be counted. She was reminded that bringing the medication bottles, even when empty, is a requirement.  Medication: None brought in. Pill/Patch Count: None available to be counted. Bottle Appearance: No container available. Did not bring bottle(s) to appointment. Filled Date: N/A Last Medication intake:  Today  Reminded to bring pills to appointments

## 2020-03-25 LAB — TOXASSURE SELECT 13 (MW), URINE

## 2020-04-07 ENCOUNTER — Other Ambulatory Visit: Payer: Self-pay | Admitting: Family Medicine

## 2020-04-07 DIAGNOSIS — F419 Anxiety disorder, unspecified: Secondary | ICD-10-CM

## 2020-04-26 ENCOUNTER — Ambulatory Visit (INDEPENDENT_AMBULATORY_CARE_PROVIDER_SITE_OTHER): Payer: Medicare Other

## 2020-04-26 DIAGNOSIS — Z0000 Encounter for general adult medical examination without abnormal findings: Secondary | ICD-10-CM | POA: Diagnosis not present

## 2020-04-26 NOTE — Progress Notes (Signed)
Subjective:   Elizabeth [name] is a 79 y.o. female who presents for Medicare Annual (Subsequent) preventive examination.  Virtual Visit via Telephone Note  I connected with  [name] on 04/26/20 at  1:30 PM EDT by telephone and verified that I am speaking with the correct person using two identifiers.  Medicare Annual Wellness visit completed telephonically due to Covid-19 pandemic.   Location: Patient: home Provider: [facility]   I discussed the limitations, risks, security and privacy concerns of performing an evaluation and management service by telephone and the availability of in person appointments. The patient expressed understanding and agreed to proceed.  Unable to perform video visit due to video visit attempted and failed and/or patient does not have video capability.   Some vital signs may be absent or patient reported.   [name], LPN   Review of Systems     Cardiac Risk Factors include: advanced age (>43men, >13 women);dyslipidemia;hypertension     Objective:    Today's Vitals   04/26/20 1351  PainSc: 3    There is no height or weight on file to calculate BMI.  Advanced Directives 04/26/2020 03/22/2020 03/13/2020 12/26/2018 05/17/2018 05/17/2018 05/17/2018  Does Patient Have a Medical Advance Directive? No No No No Yes Yes Yes  Type of Advance Directive - - - - Living will Living will Living will  Does patient want to make changes to medical advance directive? - - - - No - Patient declined - -  Would patient like information on creating a medical advance directive? Yes (MAU/Ambulatory/Procedural Areas - Information given) No - Patient declined - No - Patient declined - - -    Current Medications (verified) Outpatient Encounter Medications as of 04/26/2020  Medication Sig  . acetaminophen (TYLENOL) 500 MG tablet Take 1,000 mg by mouth 2 (two) times daily as needed for moderate pain.  [name] amLODipine (NORVASC) 10 MG tablet TAKE 1 TABLET BY MOUTH EVERY DAY  .  busPIRone (BUSPAR) 5 MG tablet Take 1-2 tablets (5-10 mg total) by mouth 2 (two) times daily as needed (anxiety/nerves/panic).  . chlorthalidone (HYGROTON) 25 MG tablet Take 1 tablet (25 mg total) by mouth daily.  . cholecalciferol (VITAMIN D) 1000 units tablet Take 1,000 Units by mouth daily.  . citalopram (CELEXA) 10 MG tablet Take 1 tablet (10 mg total) by mouth at bedtime.  . clopidogrel (PLAVIX) 75 MG tablet Take 1 tablet (75 mg total) by mouth daily.  . Cyanocobalamin (VITAMIN B-12 PO) Take 1 tablet by mouth daily.  [name] glucosamine-chondroitin 500-400 MG tablet Take 1 tablet by mouth daily.  [name] HYDROcodone-acetaminophen (NORCO) 10-325 MG tablet Take 1 tablet by mouth every 4 (four) hours as needed.  . isosorbide mononitrate (IMDUR) 30 MG 24 hr tablet Take 0.5 tablets (15 mg total) by mouth daily.  [name] lisinopril (ZESTRIL) 40 MG tablet TAKE 1 TABLET BY MOUTH EVERY DAY  . metoprolol tartrate (LOPRESSOR) 100 MG tablet Take 1 tablet (100 mg total) by mouth in the morning, at noon, and at bedtime.  . Multiple Vitamin (MULTIVITAMIN) tablet Take 1 tablet by mouth daily.  . potassium citrate (UROCIT-K) 10 MEQ (1080 MG) SR tablet Take 10 mEq by mouth 2 (two) times daily.  . rosuvastatin (CRESTOR) 20 MG tablet TAKE 1 TABLET BY MOUTH EVERYDAY AT BEDTIME  . [START ON 05/21/2020] HYDROcodone-acetaminophen (NORCO) 10-325 MG tablet Take 1 tablet by mouth every 4 (four) hours as needed.  . nitroGLYCERIN (NITROSTAT) 0.4 MG SL tablet Place 1 tablet (0.4 mg  total) under the tongue every 5 (five) minutes as needed for chest pain.   No facility-administered encounter medications on file as of 04/26/2020.    Allergies (verified) Codeine and Sulfa antibiotics   History: Past Medical History:  Diagnosis Date  . 3-vessel CAD    05/2018 LHC with a single remaining conduit which is the left main coronary artery supplying the LAD.  Both the circumflex and right coronary artery were occluded.  The distal left main,  proximal LAD is aneurysmal and heavily calcified.  The LAD supplies well-formed collaterals to the PDA.  Not felt to be candidate for revascularization and with recommendation for medical mgmt  . AAA (abdominal aortic aneurysm) (HCC)    4.3cm (12/2018)  . Celiac artery stenosis (HCC)   . Degenerative joint disease of left hip   . Heart failure with preserved ejection fraction (HCC)    EF 60-65%, 05/2018  . History of non-ST elevation myocardial infarction (NSTEMI)    05/2018 with subsequent LHC and in the setting of acute aortic ulcer and hematoma  . Hyperlipidemia   . Hypertension   . Internal carotid artery stenosis, left    80-99% 05/2018  . Intramural hematoma of thoracic aorta (HCC)    05/2018  . Mitral regurgitation    Mild by 05/2018 echo  . Paroxysmal SVT (supraventricular tachycardia) (HCC)    05/2018  . Peripheral vascular disease (HCC)    extensive vascular dz s/p b/l carotid endarterectomies (2014) and iliac stenting, AAA, aortic ulcer  . Right renal artery stenosis (HCC)    05/2018  . Smoking hx    Quit ~4709  . Status post bilateral carotid endarterectomy    Followed by Vascular surgery with most recent images 05/2018 showing L ICA 80-99% stenosis  . Subclavian artery stenosis, left (HCC)    05/2018   Past Surgical History:  Procedure Laterality Date  . APPENDECTOMY    . CARDIAC CATHETERIZATION    . HIP SURGERY Left    x3  . LEFT HEART CATH AND CORONARY ANGIOGRAPHY N/A 05/19/2018   Procedure: LEFT HEART CATH AND CORONARY ANGIOGRAPHY;  Surgeon: Lyn Records, MD;  Location: [facility];  Service: Cardiovascular;  Laterality: N/A;  . PERIPHERAL VASCULAR CATHETERIZATION Right 02/02/2016   Procedure: Lower Extremity Angiography;  Surgeon: Annice Needy, MD;  Location: [facility] INVASIVE CV LAB;  Service: Cardiovascular;  Laterality: Right;   Family History  Problem Relation Age of Onset  . Hypertension Mother   . Stroke Mother   . Hypertension Father   . Stroke  Father   . Hypertension Sister   . Hyperlipidemia Sister   . Hodgkin's lymphoma Son   . Heart disease Son   . Kidney disease Son   . Mental illness Neg Hx    Social History   Socioeconomic History  . Marital status: Widowed    Spouse name: Not on file  . Number of children: 2  . Years of education: Not on file  . Highest education level: GED or equivalent  Occupational History  . Occupation: diabled  Tobacco Use  . Smoking status: Former Smoker    Quit date: 02/02/1999    Years since quitting: 21.2  . Smokeless tobacco: Never Used  Vaping Use  . Vaping Use: Never used  Substance and Sexual Activity  . Alcohol use: No  . Drug use: No  . Sexual activity: Not Currently  Other Topics Concern  . Not on file  Social History Narrative   Pt  lives alone, does not drive but sister drives her when needed.    Social Determinants of Health   Financial Resource Strain: Low Risk   . Difficulty of Paying Living Expenses: Not hard at all  Food Insecurity: No Food Insecurity  . Worried About Programme researcher, broadcasting/film/videounning Out of Food in the Last Year: Never true  . Ran Out of Food in the Last Year: Never true  Transportation Needs: No Transportation Needs  . Lack of Transportation (Medical): No  . Lack of Transportation (Non-Medical): No  Physical Activity: Inactive  . Days of Exercise per Week: 0 days  . Minutes of Exercise per Session: 0 min  Stress: No Stress Concern Present  . Feeling of Stress : Not at all  Social Connections: Moderately Isolated  . Frequency of Communication with Friends and Family: More than three times a week  . Frequency of Social Gatherings with Friends and Family: More than three times a week  . Attends Religious Services: More than 4 times per year  . Active Member of Clubs or Organizations: No  . Attends BankerClub or Organization Meetings: Never  . Marital Status: Widowed    Tobacco Counseling Counseling given: Not Answered   Clinical Intake:  Pre-visit preparation  completed: Yes  Pain : 0-10 Pain Score: 3  Pain Type: Chronic pain Pain Location: Hip (lower back) Pain Orientation: Left Pain Descriptors / Indicators: Aching, Sore Pain Onset: More than a month ago Pain Frequency: Constant     Nutritional Status: BMI of 19-24  Normal Nutritional Risks: None Diabetes: No  How often do you need to have someone help you when you read instructions, pamphlets, or other written materials from your doctor or pharmacy?: 1 - Never    Interpreter Needed?: No  Information entered by :: Reather LittlerKasey Yosselyn Tax LPN   Activities of Daily Living In your present state of health, do you have any difficulty performing the following activities: 04/26/2020 09/16/2019  Hearing? N N  Comment declines hearing aids -  Vision? N N  Difficulty concentrating or making decisions? N N  Walking or climbing stairs? N Y  Dressing or bathing? N N  Doing errands, shopping? N Y  Quarry managerreparing Food and eating ? N -  Using the Toilet? N -  In the past six months, have you accidently leaked urine? N -  Do you have problems with loss of bowel control? N -  Managing your Medications? N -  Managing your Finances? N -  Housekeeping or managing your Housekeeping? N -  Some recent data might be hidden    Patient Care Team: Danelle Berryapia, Leisa, PA-C as PCP - General (Family Medicine) Debbe OdeaAgbor-Etang, Brian, MD as PCP - Cardiology (Cardiology)  Indicate any recent Medical Services you may have received from other than [facility] providers in the past year (date may be approximate).     Assessment:   This is a routine wellness examination for Maureen RalphsVivian.  Hearing/Vision screen  Hearing Screening   125Hz  250Hz  500Hz  1000Hz  2000Hz  3000Hz  4000Hz  6000Hz  8000Hz   Right ear:           Left ear:           Comments: Pt denies hearing difficulty  Vision Screening Comments: Due for eye exam established with Dr. Alvester MorinBell  Dietary issues and exercise activities discussed: Current Exercise Habits: The patient does not  participate in regular exercise at present, Exercise limited by: orthopedic condition(s)  Goals    . Prevent falls     Continue to prevent falls  with balance and strength training.       Depression Screen PHQ 2/9 Scores 04/26/2020 03/22/2020 03/15/2020 12/22/2019 09/16/2019 06/16/2019  PHQ - 2 Score 0 0 0 0 0 0  PHQ- 9 Score - - 0 - 1 0    Fall Risk Fall Risk  04/26/2020 03/22/2020 03/15/2020 12/22/2019 09/16/2019  Falls in the past year? 0 0 0 0 0  Number falls in past yr: 0 - 0 0 0  Injury with Fall? 0 - 0 0 0  Risk for fall due to : Impaired balance/gait;Orthopedic patient - - - -  Follow up Falls prevention discussed - Falls evaluation completed - -    Any stairs in or around the home? No  If so, are there any without handrails? No  Home free of loose throw rugs in walkways, pet beds, electrical cords, etc? Yes  Adequate lighting in your home to reduce risk of falls? Yes   ASSISTIVE DEVICES UTILIZED TO PREVENT FALLS:  Life alert? Yes - pt does not wear life alert but lives in senior housing with bell system Use of a cane, walker or w/c? Yes  Grab bars in the bathroom? Yes  Shower chair or bench in shower? Yes  Elevated toilet seat or a handicapped toilet? No   TIMED UP AND GO:  Was the test performed? No . Telephonic visit.   Cognitive Function:     6CIT Screen 04/26/2020  What Year? 0 points  What month? 0 points  What time? 0 points  Count back from 20 0 points  Months in reverse 0 points  Repeat phrase 0 points  Total Score 0    Immunizations Immunization History  Administered Date(s) Administered  . Influenza, High Dose Seasonal PF 05/01/2017, 04/08/2018  . Influenza-Unspecified 05/06/2014, 04/22/2019  . PFIZER SARS-COV-2 Vaccination 08/12/2019, 09/02/2019    TDAP status: Due, Education has been provided regarding the importance of this vaccine. Advised may receive this vaccine at local pharmacy or Health Dept. Aware to provide a copy of the vaccination record  if obtained from local pharmacy or Health Dept. Verbalized acceptance and understanding.   Flu Vaccine status: pt plans to get at upcoming appt.   Pneumococcal vaccine status: Declined,  Education has been provided regarding the importance of this vaccine but patient still declined. Advised may receive this vaccine at local pharmacy or Health Dept. Aware to provide a copy of the vaccination record if obtained from local pharmacy or Health Dept. Verbalized acceptance and understanding.    Covid-19 vaccine status: Completed vaccines  Qualifies for Shingles Vaccine? Yes   Zostavax completed No   Shingrix Completed?: No.    Education has been provided regarding the importance of this vaccine. Patient has been advised to call insurance company to determine out of pocket expense if they have not yet received this vaccine. Advised may also receive vaccine at local pharmacy or Health Dept. Verbalized acceptance and understanding.  Screening Tests Health Maintenance  Topic Date Due  . Hepatitis C Screening  Never done  . TETANUS/TDAP  Never done  . DEXA SCAN  Never done  . PNA vac Low Risk Adult (1 of 2 - PCV13) Never done  . INFLUENZA VACCINE  03/06/2020  . COVID-19 Vaccine  Completed    Health Maintenance  Health Maintenance Due  Topic Date Due  . Hepatitis C Screening  Never done  . TETANUS/TDAP  Never done  . DEXA SCAN  Never done  . PNA vac Low Risk Adult (1 of  2 - PCV13) Never done  . INFLUENZA VACCINE  03/06/2020    Colorectal cancer screening: No longer required.    Mammogram status:declined  Bone Density status: declined  Lung Cancer Screening: (Low Dose CT Chest recommended if Age 88-80 years, 30 pack-year currently smoking OR have quit w/in 15years.) does not qualify.   Additional Screening:  Hepatitis C Screening: does qualify; postponed  Vision Screening: Recommended annual ophthalmology exams for early detection of glaucoma and other disorders of the eye. Is the  patient up to date with their annual eye exam?  No  Who is the provider or what is the name of the office in which the patient attends annual eye exams? Dr. Alvester Morin  Dental Screening: Recommended annual dental exams for proper oral hygiene  Community Resource Referral / Chronic Care Management: CRR required this visit?  No   CCM required this visit?  No      Plan:     I have personally reviewed and noted the following in the patient's chart:   . Medical and social history . Use of alcohol, tobacco or illicit drugs  . Current medications and supplements . Functional ability and status . Nutritional status . Physical activity . Advanced directives . List of other physicians . Hospitalizations, surgeries, and ER visits in previous 12 months . Vitals . Screenings to include cognitive, depression, and falls . Referrals and appointments  In addition, I have reviewed and discussed with patient certain preventive protocols, quality metrics, and best practice recommendations. A written personalized care plan for preventive services as well as general preventive health recommendations were provided to patient.     [name], LPN   [id number]/56/[id number]   Nurse Notes: none

## 2020-04-26 NOTE — Patient Instructions (Signed)
Elizabeth [name] , Thank you for taking time to come for your Medicare Wellness Visit. I appreciate your ongoing commitment to your health goals. Please review the following plan we discussed and let me know if I can assist you in the future.   Screening recommendations/referrals: Colonoscopy: no longer required Mammogram: declined Bone Density: declined Recommended yearly ophthalmology/optometry visit for glaucoma screening and checkup Recommended yearly dental visit for hygiene and checkup  Vaccinations: Influenza vaccine: due Pneumococcal vaccine: due Tdap vaccine: due Shingles vaccine: Shingrix discussed. Please contact your pharmacy for coverage information.  Covid-19: done 08/12/19 & 09/02/19  Advanced directives: Advance directive discussed with you today. I have provided a copy for you to complete at home and have notarized. Once this is complete please bring a copy in to our office so we can scan it into your chart.  Conditions/risks identified: Recommend increasing physical activity   Next appointment: Follow up in one year for your annual wellness visit    Preventive Care 65 Years and Older, Female Preventive care refers to lifestyle choices and visits with your health care provider that can promote health and wellness. What does preventive care include?  A yearly physical exam. This is also called an annual well check.  Dental exams once or twice a year.  Routine eye exams. Ask your health care provider how often you should have your eyes checked.  Personal lifestyle choices, including:  Daily care of your teeth and gums.  Regular physical activity.  Eating a healthy diet.  Avoiding tobacco and drug use.  Limiting alcohol use.  Practicing safe sex.  Taking low-dose aspirin every day.  Taking vitamin and mineral supplements as recommended by your health care provider. What happens during an annual well check? The services and screenings done by your health care  provider during your annual well check will depend on your age, overall health, lifestyle risk factors, and family history of disease. Counseling  Your health care provider may ask you questions about your:  Alcohol use.  Tobacco use.  Drug use.  Emotional well-being.  Home and relationship well-being.  Sexual activity.  Eating habits.  History of falls.  Memory and ability to understand (cognition).  Work and work Astronomer.  Reproductive health. Screening  You may have the following tests or measurements:  Height, weight, and BMI.  Blood pressure.  Lipid and cholesterol levels. These may be checked every 5 years, or more frequently if you are over 46 years old.  Skin check.  Lung cancer screening. You may have this screening every year starting at age 54 if you have a 30-pack-year history of smoking and currently smoke or have quit within the past 15 years.  Fecal occult blood test (FOBT) of the stool. You may have this test every year starting at age 45.  Flexible sigmoidoscopy or colonoscopy. You may have a sigmoidoscopy every 5 years or a colonoscopy every 10 years starting at age 33.  Hepatitis C blood test.  Hepatitis B blood test.  Sexually transmitted disease (STD) testing.  Diabetes screening. This is done by checking your blood sugar (glucose) after you have not eaten for a while (fasting). You may have this done every 1-3 years.  Bone density scan. This is done to screen for osteoporosis. You may have this done starting at age 88.  Mammogram. This may be done every 1-2 years. Talk to your health care provider about how often you should have regular mammograms. Talk with your health care provider about your  test results, treatment options, and if necessary, the need for more tests. Vaccines  Your health care provider may recommend certain vaccines, such as:  Influenza vaccine. This is recommended every year.  Tetanus, diphtheria, and acellular  pertussis (Tdap, Td) vaccine. You may need a Td booster every 10 years.  Zoster vaccine. You may need this after age 48.  Pneumococcal 13-valent conjugate (PCV13) vaccine. One dose is recommended after age 51.  Pneumococcal polysaccharide (PPSV23) vaccine. One dose is recommended after age 54. Talk to your health care provider about which screenings and vaccines you need and how often you need them. This information is not intended to replace advice given to you by your health care provider. Make sure you discuss any questions you have with your health care provider. Document Released: 08/19/2015 Document Revised: 04/11/2016 Document Reviewed: 05/24/2015 Elsevier Interactive Patient Education  2017 [employer].  Fall Prevention in the Home Falls can cause injuries. They can happen to people of all ages. There are many things you can do to make your home safe and to help prevent falls. What can I do on the outside of my home?  Regularly fix the edges of walkways and driveways and fix any cracks.  Remove anything that might make you trip as you walk through a door, such as a raised step or threshold.  Trim any bushes or trees on the path to your home.  Use bright outdoor lighting.  Clear any walking paths of anything that might make someone trip, such as rocks or tools.  Regularly check to see if handrails are loose or broken. Make sure that both sides of any steps have handrails.  Any raised decks and porches should have guardrails on the edges.  Have any leaves, snow, or ice cleared regularly.  Use sand or salt on walking paths during winter.  Clean up any spills in your garage right away. This includes oil or grease spills. What can I do in the bathroom?  Use night lights.  Install grab bars by the toilet and in the tub and shower. Do not use towel bars as grab bars.  Use non-skid mats or decals in the tub or shower.  If you need to sit down in the shower, use a plastic,  non-slip stool.  Keep the floor dry. Clean up any water that spills on the floor as soon as it happens.  Remove soap buildup in the tub or shower regularly.  Attach bath mats securely with double-sided non-slip rug tape.  Do not have throw rugs and other things on the floor that can make you trip. What can I do in the bedroom?  Use night lights.  Make sure that you have a light by your bed that is easy to reach.  Do not use any sheets or blankets that are too big for your bed. They should not hang down onto the floor.  Have a firm chair that has side arms. You can use this for support while you get dressed.  Do not have throw rugs and other things on the floor that can make you trip. What can I do in the kitchen?  Clean up any spills right away.  Avoid walking on wet floors.  Keep items that you use a lot in easy-to-reach places.  If you need to reach something above you, use a strong step stool that has a grab bar.  Keep electrical cords out of the way.  Do not use floor polish or wax that  makes floors slippery. If you must use wax, use non-skid floor wax.  Do not have throw rugs and other things on the floor that can make you trip. What can I do with my stairs?  Do not leave any items on the stairs.  Make sure that there are handrails on both sides of the stairs and use them. Fix handrails that are broken or loose. Make sure that handrails are as long as the stairways.  Check any carpeting to make sure that it is firmly attached to the stairs. Fix any carpet that is loose or worn.  Avoid having throw rugs at the top or bottom of the stairs. If you do have throw rugs, attach them to the floor with carpet tape.  Make sure that you have a light switch at the top of the stairs and the bottom of the stairs. If you do not have them, ask someone to add them for you. What else can I do to help prevent falls?  Wear shoes that:  Do not have high heels.  Have rubber  bottoms.  Are comfortable and fit you well.  Are closed at the toe. Do not wear sandals.  If you use a stepladder:  Make sure that it is fully opened. Do not climb a closed stepladder.  Make sure that both sides of the stepladder are locked into place.  Ask someone to hold it for you, if possible.  Clearly mark and make sure that you can see:  Any grab bars or handrails.  First and last steps.  Where the edge of each step is.  Use tools that help you move around (mobility aids) if they are needed. These include:  Canes.  Walkers.  Scooters.  Crutches.  Turn on the lights when you go into a dark area. Replace any light bulbs as soon as they burn out.  Set up your furniture so you have a clear path. Avoid moving your furniture around.  If any of your floors are uneven, fix them.  If there are any pets around you, be aware of where they are.  Review your medicines with your doctor. Some medicines can make you feel dizzy. This can increase your chance of falling. Ask your doctor what other things that you can do to help prevent falls. This information is not intended to replace advice given to you by your health care provider. Make sure you discuss any questions you have with your health care provider. Document Released: 05/19/2009 Document Revised: 12/29/2015 Document Reviewed: 08/27/2014 Elsevier Interactive Patient Education  2017 [employer].
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
## 2020-04-28 ENCOUNTER — Ambulatory Visit (INDEPENDENT_AMBULATORY_CARE_PROVIDER_SITE_OTHER): Payer: Medicare Other | Admitting: Family Medicine

## 2020-04-28 ENCOUNTER — Encounter: Payer: Self-pay | Admitting: Family Medicine

## 2020-04-28 ENCOUNTER — Other Ambulatory Visit: Payer: Self-pay

## 2020-04-28 VITALS — BP 134/76 | HR 65 | Temp 98.6°F | Resp 14 | Ht 60.0 in | Wt 127.7 lb

## 2020-04-28 DIAGNOSIS — E785 Hyperlipidemia, unspecified: Secondary | ICD-10-CM | POA: Diagnosis not present

## 2020-04-28 DIAGNOSIS — I25118 Atherosclerotic heart disease of native coronary artery with other forms of angina pectoris: Secondary | ICD-10-CM | POA: Diagnosis not present

## 2020-04-28 DIAGNOSIS — I10 Essential (primary) hypertension: Secondary | ICD-10-CM | POA: Diagnosis not present

## 2020-04-28 DIAGNOSIS — Z23 Encounter for immunization: Secondary | ICD-10-CM

## 2020-04-28 DIAGNOSIS — R002 Palpitations: Secondary | ICD-10-CM

## 2020-04-28 DIAGNOSIS — F419 Anxiety disorder, unspecified: Secondary | ICD-10-CM | POA: Diagnosis not present

## 2020-04-28 DIAGNOSIS — D582 Other hemoglobinopathies: Secondary | ICD-10-CM | POA: Diagnosis not present

## 2020-04-28 DIAGNOSIS — Z5181 Encounter for therapeutic drug level monitoring: Secondary | ICD-10-CM

## 2020-04-28 LAB — CBC WITH DIFFERENTIAL/PLATELET
Absolute Monocytes: 708 cells/uL (ref 200–950)
Basophils Absolute: 30 cells/uL (ref 0–200)
Basophils Relative: 0.5 %
Eosinophils Absolute: 180 cells/uL (ref 15–500)
Eosinophils Relative: 3 %
HCT: 44.4 % (ref 35.0–45.0)
Hemoglobin: 15.1 g/dL (ref 11.7–15.5)
Lymphs Abs: 1806 cells/uL (ref 850–3900)
MCH: 35 pg — ABNORMAL HIGH (ref 27.0–33.0)
MCHC: 34 g/dL (ref 32.0–36.0)
MCV: 103 fL — ABNORMAL HIGH (ref 80.0–100.0)
MPV: 11.6 fL (ref 7.5–12.5)
Monocytes Relative: 11.8 %
Neutro Abs: 3276 cells/uL (ref 1500–7800)
Neutrophils Relative %: 54.6 %
Platelets: 217 10*3/uL (ref 140–400)
RBC: 4.31 10*6/uL (ref 3.80–5.10)
RDW: 12.2 % (ref 11.0–15.0)
Total Lymphocyte: 30.1 %
WBC: 6 10*3/uL (ref 3.8–10.8)

## 2020-04-28 LAB — COMPLETE METABOLIC PANEL WITH GFR
AG Ratio: 1.6 (calc) (ref 1.0–2.5)
ALT: 16 U/L (ref 6–29)
AST: 22 U/L (ref 10–35)
Albumin: 4.5 g/dL (ref 3.6–5.1)
Alkaline phosphatase (APISO): 71 U/L (ref 37–153)
BUN/Creatinine Ratio: 26 (calc) — ABNORMAL HIGH (ref 6–22)
BUN: 33 mg/dL — ABNORMAL HIGH (ref 7–25)
CO2: 26 mmol/L (ref 20–32)
Calcium: 9.9 mg/dL (ref 8.6–10.4)
Chloride: 104 mmol/L (ref 98–110)
Creat: 1.29 mg/dL — ABNORMAL HIGH (ref 0.60–0.93)
GFR, Est African American: 46 mL/min/{1.73_m2} — ABNORMAL LOW (ref >=60)
GFR, Est Non African American: 40 mL/min/{1.73_m2} — ABNORMAL LOW (ref >=60)
Globulin: 2.9 g/dL (calc) (ref 1.9–3.7)
Glucose, Bld: 95 mg/dL (ref 65–99)
Potassium: 5.5 mmol/L — ABNORMAL HIGH (ref 3.5–5.3)
Sodium: 138 mmol/L (ref 135–146)
Total Bilirubin: 0.4 mg/dL (ref 0.2–1.2)
Total Protein: 7.4 g/dL (ref 6.1–8.1)

## 2020-04-28 MED ORDER — CITALOPRAM HYDROBROMIDE 10 MG PO TABS: 10.0000 mg | ORAL_TABLET | Freq: Every day | ORAL | 3 refills | Status: DC

## 2020-04-28 MED ORDER — METOPROLOL TARTRATE 100 MG PO TABS: ORAL_TABLET | ORAL | 3 refills | Status: DC

## 2020-05-05 ENCOUNTER — Other Ambulatory Visit: Payer: Self-pay

## 2020-05-05 ENCOUNTER — Ambulatory Visit (INDEPENDENT_AMBULATORY_CARE_PROVIDER_SITE_OTHER): Payer: Medicare Other | Admitting: Cardiology

## 2020-05-05 ENCOUNTER — Encounter: Payer: Self-pay | Admitting: Cardiology

## 2020-05-05 VITALS — BP 148/86 | HR 65 | Ht 60.0 in | Wt 128.0 lb

## 2020-05-05 DIAGNOSIS — I739 Peripheral vascular disease, unspecified: Secondary | ICD-10-CM

## 2020-05-05 DIAGNOSIS — E785 Hyperlipidemia, unspecified: Secondary | ICD-10-CM | POA: Diagnosis not present

## 2020-05-05 DIAGNOSIS — I10 Essential (primary) hypertension: Secondary | ICD-10-CM

## 2020-05-05 DIAGNOSIS — I2510 Atherosclerotic heart disease of native coronary artery without angina pectoris: Secondary | ICD-10-CM | POA: Diagnosis not present

## 2020-05-05 MED ORDER — CARVEDILOL 25 MG PO TABS
25.0000 mg | ORAL_TABLET | Freq: Two times a day (BID) | ORAL | 1 refills | Status: DC
Start: 2020-05-05 — End: 2020-05-19

## 2020-05-05 MED ORDER — ROSUVASTATIN CALCIUM 40 MG PO TABS
40.0000 mg | ORAL_TABLET | Freq: Every day | ORAL | 3 refills | Status: DC
Start: 2020-05-05 — End: 2020-08-04

## 2020-05-05 MED ORDER — ASPIRIN EC 81 MG PO TBEC
81.0000 mg | DELAYED_RELEASE_TABLET | Freq: Every day | ORAL | 3 refills | Status: AC
Start: 2020-05-05 — End: ?

## 2020-05-05 NOTE — Patient Instructions (Signed)
Medication Instructions:  Your physician has recommended you make the following change in your medication:  1- STOP Lopressor at this time. 2- INCREASE Crestor to 40 mg by mouth once a day. 3- START Coreg (carvedilol) 25 mg by mouth two times a day.  *If you need a refill on your cardiac medications before your next appointment, please call your pharmacy*  Follow-Up: At [facility], you and your health needs are our priority.  As part of our continuing mission to provide you with exceptional heart care, we have created designated Provider Care Teams.  These Care Teams include your primary Cardiologist (physician) and Advanced Practice Providers (APPs -  Physician Assistants and Nurse Practitioners) who all work together to provide you with the care you need, when you need it.  We recommend signing up for the patient portal called "MyChart".  Sign up information is provided on this After Visit Summary.  MyChart is used to connect with patients for Virtual Visits (Telemedicine).  Patients are able to view lab/test results, encounter notes, upcoming appointments, etc.  Non-urgent messages can be sent to your provider as well.   To learn more about what you can do with MyChart, go to [email or web address].    Your next appointment:   1 month(s)  The format for your next appointment:   In Person  Provider:    Debbe Odea, MD

## 2020-05-05 NOTE — Progress Notes (Signed)
Cardiology Office Note:    Date:  05/05/2020   ID:  Elizabeth BasqueVivian M Giammona, DOB Dec 19, 1940, MRN [id number]  PCP:  Danelle Berryapia, Leisa, PA-C  Cardiologist:  Debbe OdeaBrian Agbor-Etang, MD  Electrophysiologist:  None   Referring MD: Danelle Berryapia, Leisa, PA-C   No chief complaint on file.   History of Present Illness:    Elizabeth [name] is a 79 y.o. 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    She was last seen with elevated blood pressure.  Hydralazine was added to her blood pressure regimen but she states not tolerating hydralazine, causing her to be dizzy and actually elevated blood pressures.  Her symptoms improved since stopping.  She takes Lopressor 100 mg twice daily and sometimes an additional half a pill in the p.m. if her blood pressures are elevated.  Blood pressures have been better overall, systolics in the 140s.  She feels okay, denies chest pain or shortness of breath.  Historical notes Patient used to be seen by Dr. Gery PrayBarry at [place].  Patient [facility] to [facility] at [facility].  In October 2019, she underwent a left heart cath showing multivessel disease but was not seen as a candidate for revascularization.  Medical therapy was recommended.  The plan is if she develops symptoms in the future, high risk PCI may be considered.ast echocardiogram on 05/2018 showed normal ejection fraction with EF 60 to 65%  She is being followed by vascular surgery for management of AAA, carotid and peripheral vascular disease.     Past Medical History:  Diagnosis Date  . 3-vessel CAD    05/2018 LHC with a single remaining conduit which is the left main coronary artery supplying the LAD.  Both the circumflex and right coronary artery were occluded.  The distal left main,  proximal LAD is aneurysmal and heavily calcified.  The LAD supplies well-formed collaterals to the PDA.  Not felt to be candidate for revascularization and with recommendation for medical mgmt  . AAA (abdominal aortic aneurysm) (HCC)    4.3cm (12/2018)  . Celiac artery stenosis (HCC)   . Degenerative joint disease of left hip   . Heart failure with preserved ejection fraction (HCC)    EF 60-65%, 05/2018  . History of non-ST elevation myocardial infarction (NSTEMI)    05/2018 with subsequent LHC and in the setting of acute aortic ulcer and hematoma  . Hyperlipidemia   . Hypertension   . Internal carotid artery stenosis, left    80-99% 05/2018  . Intramural hematoma of thoracic aorta (HCC)    05/2018  . Mitral regurgitation    Mild by 05/2018 echo  . Paroxysmal SVT (supraventricular tachycardia) (HCC)    05/2018  . Peripheral vascular disease (HCC)    extensive vascular dz s/p b/l carotid endarterectomies (2014) and iliac stenting, AAA, aortic ulcer  . Right renal artery stenosis (HCC)    05/2018  . Smoking hx    Quit ~0865~1992  . Status post bilateral carotid endarterectomy    Followed by Vascular surgery with most recent images 05/2018 showing L ICA 80-99% stenosis  . Subclavian artery stenosis, left (HCC)    05/2018    Past Surgical History:  Procedure Laterality Date  . APPENDECTOMY    . CARDIAC CATHETERIZATION    . HIP  SURGERY Left    x3  . LEFT HEART CATH AND CORONARY ANGIOGRAPHY N/A 05/19/2018   Procedure: LEFT HEART CATH AND CORONARY ANGIOGRAPHY;  Surgeon: Lyn Records, MD;  Location: [facility] INVASIVE CV LAB;  Service: Cardiovascular;  Laterality: N/A;  . PERIPHERAL VASCULAR CATHETERIZATION Right 02/02/2016   Procedure: Lower Extremity Angiography;  Surgeon: Annice Needy, MD;  Location: [facility] INVASIVE CV LAB;  Service: Cardiovascular;  Laterality: Right;    Current Medications: Current Meds  Medication Sig  . acetaminophen (TYLENOL) 500 MG tablet Take 1,000 mg by mouth 2  (two) times daily as needed for moderate pain.  [name] amLODipine (NORVASC) 10 MG tablet TAKE 1 TABLET BY MOUTH EVERY DAY  . busPIRone (BUSPAR) 5 MG tablet Take 1-2 tablets (5-10 mg total) by mouth 2 (two) times daily as needed (anxiety/nerves/panic).  . chlorthalidone (HYGROTON) 25 MG tablet Take 1 tablet (25 mg total) by mouth daily.  . cholecalciferol (VITAMIN D) 1000 units tablet Take 1,000 Units by mouth daily.  . citalopram (CELEXA) 10 MG tablet Take 1 tablet (10 mg total) by mouth at bedtime.  . clopidogrel (PLAVIX) 75 MG tablet Take 1 tablet (75 mg total) by mouth daily.  . Cyanocobalamin (VITAMIN B-12 PO) Take 1 tablet by mouth daily.  [name] glucosamine-chondroitin 500-400 MG tablet Take 1 tablet by mouth daily.  [name] HYDROcodone-acetaminophen (NORCO) 10-325 MG tablet Take 1 tablet by mouth every 4 (four) hours as needed.  Melene Muller ON 05/21/2020] HYDROcodone-acetaminophen (NORCO) 10-325 MG tablet Take 1 tablet by mouth every 4 (four) hours as needed.  . isosorbide mononitrate (IMDUR) 30 MG 24 hr tablet Take 0.5 tablets (15 mg total) by mouth daily.  [name] lisinopril (ZESTRIL) 40 MG tablet TAKE 1 TABLET BY MOUTH EVERY DAY  . Multiple Vitamin (MULTIVITAMIN) tablet Take 1 tablet by mouth daily.  . nitroGLYCERIN (NITROSTAT) 0.4 MG SL tablet Place 1 tablet (0.4 mg total) under the tongue every 5 (five) minutes as needed for chest pain.  . potassium citrate (UROCIT-K) 10 MEQ (1080 MG) SR tablet Take 10 mEq by mouth 2 (two) times daily.  . [DISCONTINUED] metoprolol tartrate (LOPRESSOR) 100 MG tablet Take 100 mg po qam, 75 mg q noon, and 100 mg q pm  . [DISCONTINUED] rosuvastatin (CRESTOR) 20 MG tablet TAKE 1 TABLET BY MOUTH EVERYDAY AT BEDTIME     Allergies:   Codeine and Sulfa antibiotics   Social History   Socioeconomic History  . Marital status: Widowed    Spouse name: Not on file  . Number of children: 2  . Years of education: Not on file  . Highest education level: GED or equivalent    Occupational History  . Occupation: diabled  Tobacco Use  . Smoking status: Former Smoker    Quit date: 02/02/1999    Years since quitting: 21.2  . Smokeless tobacco: Never Used  Vaping Use  . Vaping Use: Never used  Substance and Sexual Activity  . Alcohol use: No  . Drug use: No  . Sexual activity: Not Currently  Other Topics Concern  . Not on file  Social History Narrative   Pt lives alone, does not drive but sister drives her when needed.    Social Determinants of Health   Financial Resource Strain: Low Risk   . Difficulty of Paying Living Expenses: Not hard at all  Food Insecurity: No Food Insecurity  . Worried About Programme researcher, broadcasting/film/video in the Last Year: Never true  . Ran Out of Food in the  Last Year: Never true  Transportation Needs: No Transportation Needs  . Lack of Transportation (Medical): No  . Lack of Transportation (Non-Medical): No  Physical Activity: Inactive  . Days of Exercise per Week: 0 days  . Minutes of Exercise per Session: 0 min  Stress: No Stress Concern Present  . Feeling of Stress : Not at all  Social Connections: Moderately Isolated  . Frequency of Communication with Friends and Family: More than three times a week  . Frequency of Social Gatherings with Friends and Family: More than three times a week  . Attends Religious Services: More than 4 times per year  . Active Member of Clubs or Organizations: No  . Attends Banker Meetings: Never  . Marital Status: Widowed     Family History: The patient's family history includes Heart disease in her son; Hodgkin's lymphoma in her son; Hyperlipidemia in her sister; Hypertension in her father, mother, and sister; Kidney disease in her son; Stroke in her father and mother. There is no history of Mental illness.  ROS:   Please see the history of present illness.     All other systems reviewed and are negative.  EKGs/Labs/Other Studies Reviewed:    The following studies were reviewed  today: 05/2018 LHC  Total occlusion of the mid to distal RCA. Distal RCA fills via well-formed collaterals around the left ventricular apex from the LAD.  Total occlusion of a relatively small circumflex. Minimal collaterals are noted.  Ostial 60% left main with pressure damping noted during engagement. At least 60% distal left main is noted.  Heavily calcified 80% proximal LAD followed by eccentric calcified slitlike 70% mid LAD stenosis.  Normal LV function with EF 55%.  The patient has a single remaining conduit which is the left main coronary artery supplying the LAD. Both the circumflex and right coronary are occluded. The distal left main, proximal LAD is aneurysmal and heavily calcified. The LAD supplies well-formed collaterals to the PDA.  Recommend evaluation for potential surgical revascularization.  No appealing interventional options exist.  Carotid US 05/19/2018  Summary: Right Carotid: There is no evidence of stenosis in the right ICA. Patent CEA.  Left Carotid: Velocities in the left ICA are consistent with a 80-99% stenosis. Significant acoustic shadowing and the bifurcation.  Vertebrals: Bilateral vertebral arteries demonstrate antegrade flow. Subclavians: Normal flow hemodynamics were seen in bilateral subclavian arteries. *See table(s) above for measurements and observations.  Echo 05/20/18: Study Conclusions - Left ventricle: The cavity size was normal. Wall thickness was normal. Systolic function was normal. The estimated ejection fraction was in the range of60% to 65%. Hypokinesisof the mid-apicalinferolateral myocardium; consistent with ischemia in the distribution of the right coronary or left circumflex coronary artery. Doppler parameters are consistent with abnormal left ventricular relaxation (grade 1 diastolic dysfunction). - Mitral valve: There was mild regurgitation.  EKG:  EKG is  ordered  today.  The ekg ordered today demonstrates normal sinus rhythm, rate 65.  Recent Labs: 03/13/2020: TSH 2.276 04/28/2020: ALT 16; BUN 33; Creat 1.29; Hemoglobin 15.1; Platelets 217; Potassium 5.5; Sodium 138  Recent Lipid Panel    Component Value Date/Time   CHOL 162 10/02/2019 0917   TRIG 201 (H) 10/02/2019 0917   HDL 42 (L) 10/02/2019 0917   CHOLHDL 3.9 10/02/2019 0917   VLDL 28 05/20/2018 0249   LDLCALC 90 10/02/2019 0917    Physical Exam:    VS:  BP (!) 148/86   Pulse 65   Ht 5' (1.524 m)  Wt 128 lb (58.1 kg)   SpO2 98%   BMI 25.00 kg/m     Wt Readings from Last 3 Encounters:  05/05/20 128 lb (58.1 kg)  04/28/20 127 lb 11.2 oz (57.9 kg)  03/22/20 130 lb (59 kg)     GEN:  Well nourished, well developed in no acute distress HEENT: Normal NECK: No JVD; bilateral carotid bruits noted LYMPHATICS: No lymphadenopathy CARDIAC: RRR, 2/6 systolic murmur at right upper sternal border RESPIRATORY:  Clear to auscultation without rales, wheezing or rhonchi  ABDOMEN: Soft, non-tender, non-distended MUSCULOSKELETAL:  No edema; No deformity  SKIN: Warm and dry NEUROLOGIC:  Alert and oriented x 3 PSYCHIATRIC:  Normal affect   ASSESSMENT:   . 1. Essential hypertension   2. Coronary artery disease involving native coronary artery of native heart without angina pectoris   3. Peripheral vascular disease (HCC)   4. Hyperlipidemia LDL goal <70    PLAN:    In order of problems listed above:  1. Patient with history of hypertension.  Stop Lopressor, start Coreg 25 mg twice daily.  Continue Imdur 15 daily,continue Norvasc 10 mg daily, lisinopril 40 mg daily,  chlorthalidone 25 mg daily. 2. Multivessel CAD, not candidate for PCI .  Continue dual antiplatelet therapy with aspirin Plavix.  Increase Crestor to 40 mg daily. Imdur 30 mg daily.  Last ejection fraction was normal with grade 1 diastolic dysfunction.  3. Peripheral arterial disease including history of CEA, left common iliac  stenting.  Continue aspirin Plavix Crestor.  Keep follow-up appointments with vascular surgery. 4. History of hyperlipidemia, LDL goal less than 70.  Last LDL 90.  Increase Crestor to 40 mg daily.  Follow-up in 1 month.  Total encounter time 40 minutes  Greater than 50% was spent in counseling and coordination of care with the patient   This note was generated in part or whole with voice recognition software. Voice recognition is usually quite accurate but there are transcription errors that can and very often do occur. I apologize for any typographical errors that were not detected and corrected.  Medication Adjustments/Labs and Tests Ordered: Current medicines are reviewed at length with the patient today.  Concerns regarding medicines are outlined above.  Orders Placed This Encounter  Procedures  . EKG 12-Lead   Meds ordered this encounter  Medications  . carvedilol (COREG) 25 MG tablet    Sig: Take 1 tablet (25 mg total) by mouth 2 (two) times daily.    Dispense:  60 tablet    Refill:  1  . rosuvastatin (CRESTOR) 40 MG tablet    Sig: Take 1 tablet (40 mg total) by mouth daily.    Dispense:  90 tablet    Refill:  3  . aspirin EC 81 MG tablet    Sig: Take 1 tablet (81 mg total) by mouth daily. Swallow whole.    Dispense:  90 tablet    Refill:  3    Patient Instructions  Medication Instructions:  Your physician has recommended you make the following change in your medication:  1- STOP Lopressor at this time. 2- INCREASE Crestor to 40 mg by mouth once a day. 3- START Coreg (carvedilol) 25 mg by mouth two times a day.  *If you need a refill on your cardiac medications before your next appointment, please call your pharmacy*  Follow-Up: At [facility], you and your health needs are our priority.  As part of our continuing mission to provide you with exceptional heart care, we  have created designated Provider Care Teams.  These Care Teams include your primary Cardiologist  (physician) and Advanced Practice Providers (APPs -  Physician Assistants and Nurse Practitioners) who all work together to provide you with the care you need, when you need it.  We recommend signing up for the patient portal called "MyChart".  Sign up information is provided on this After Visit Summary.  MyChart is used to connect with patients for Virtual Visits (Telemedicine).  Patients are able to view lab/test results, encounter notes, upcoming appointments, etc.  Non-urgent messages can be sent to your provider as well.   To learn more about what you can do with MyChart, go to [email or web address].    Your next appointment:   1 month(s)  The format for your next appointment:   In Person  Provider:    Debbe Odea, MD     Signed, Debbe Odea, MD  05/05/2020 12:33 PM    [facility]

## 2020-05-06 ENCOUNTER — Telehealth: Payer: Self-pay | Admitting: Cardiology

## 2020-05-06 NOTE — Telephone Encounter (Signed)
Called patient back. She stated she is feeling dizzy when sitting and more so when transitioning to standing. Feels a little better when she is up walking around. She was seen in office yesterday by Dr. Azucena Cecil. After speaking with Dr. Azucena Cecil, he recommended the following:  STOP taking Norvasc, STOP taking Chlorthalidone. Check BP before taking evening dose of Carvedilol, and if it is less than 100 Systolic, then hold it. Check BP in the AM before medications, and if Systolic is less than 100 then hold Lisinopril, Carvedilol, and Imdur.  Patient will check BP before AM and PM medications and follow the above instructions accordingly over the weekend.  I will follow up with her on Monday

## 2020-05-06 NOTE — Telephone Encounter (Signed)
     Pt c/o BP issue: STAT if pt c/o blurred vision, one-sided weakness or slurred speech  1. What are your last 5 BP readings?  120-130 systolic Is normal currently 92/55   2. Are you having any other symptoms (ex. Dizziness, headache, blurred vision, passed out)?  Dizziness when standing and when bp is low   3. What is your BP issue? Patient not sure she is taking meds right seen yesterday she was seen and meds changed .  She says she takes other bp meds as well

## 2020-05-09 ENCOUNTER — Other Ambulatory Visit: Payer: Self-pay

## 2020-05-09 DIAGNOSIS — E875 Hyperkalemia: Secondary | ICD-10-CM

## 2020-05-09 DIAGNOSIS — I10 Essential (primary) hypertension: Secondary | ICD-10-CM

## 2020-05-09 NOTE — Progress Notes (Signed)
Bmp

## 2020-05-14 ENCOUNTER — Other Ambulatory Visit: Payer: Self-pay | Admitting: Family Medicine

## 2020-05-14 DIAGNOSIS — F419 Anxiety disorder, unspecified: Secondary | ICD-10-CM

## 2020-05-19 ENCOUNTER — Telehealth: Payer: Self-pay | Admitting: Cardiology

## 2020-05-19 DIAGNOSIS — I10 Essential (primary) hypertension: Secondary | ICD-10-CM | POA: Diagnosis not present

## 2020-05-19 MED ORDER — CARVEDILOL 12.5 MG PO TABS
12.5000 mg | ORAL_TABLET | Freq: Two times a day (BID) | ORAL | 1 refills | Status: DC
Start: 2020-05-19 — End: 2020-05-27

## 2020-05-19 NOTE — Telephone Encounter (Signed)
Debbe Odea, MD  You 28 minutes ago (2:17 PM)   Decrease/half dose of Coreg to 12.5 mg twice daily. Monitor blood pressure frequently and also symptoms. Thank you   Routing comment     Called patient and she is agreeable to decrease carvedilol to 12.5 mg two times a day. Rx sent to pharmacy. She will keep [place] updated with any new or worsening symptoms arise.

## 2020-05-19 NOTE — Telephone Encounter (Signed)
Pt c/o medication issue:  1. Name of Medication: carvedilol 25 MG   2. How are you currently taking this medication (dosage and times per day)? 1 tablet 2 times daily   3. Are you having a reaction (difficulty breathing--STAT)? BP is fluctuating up and down   4. What is your medication issue? Patient calling, states that since starting medication her BP fluctuates and does not feel it is helping.  Would like to know if she can go back on the metoprolol medication.  Please call to discuss.

## 2020-05-19 NOTE — Telephone Encounter (Signed)
Spoke to patient. On 05/05/20 she saw Dr. Azucena Cecil and stopped Lopressor and started Carvedilol 25 mg BID. Since then she's been experiencing low blood pressure and dizziness after taking the morning dose.  For example, yesterday, 05/18/20; First thing in morning BP 139/71, RH 71 then took carvedilol. 1.5 hours later  BP 82/56   Dizziness Couple hours later BP 110/72, HR 90 By 5 pm  BP 158/85, HR 73.  States the dizziness lasts throughout the day until the afternoon then is better. Advised I will route to Dr. Azucena Cecil for review and let her know any adjustments.

## 2020-05-20 LAB — BASIC METABOLIC PANEL WITH GFR
BUN/Creatinine Ratio: 24 (calc) — ABNORMAL HIGH (ref 6–22)
BUN: 29 mg/dL — ABNORMAL HIGH (ref 7–25)
CO2: 25 mmol/L (ref 20–32)
Calcium: 9.7 mg/dL (ref 8.6–10.4)
Chloride: 107 mmol/L (ref 98–110)
Creat: 1.2 mg/dL — ABNORMAL HIGH (ref 0.60–0.93)
GFR, Est African American: 50 mL/min/{1.73_m2} — ABNORMAL LOW (ref >=60)
GFR, Est Non African American: 44 mL/min/{1.73_m2} — ABNORMAL LOW (ref >=60)
Glucose, Bld: 98 mg/dL (ref 65–99)
Potassium: 4.7 mmol/L (ref 3.5–5.3)
Sodium: 139 mmol/L (ref 135–146)

## 2020-05-23 ENCOUNTER — Encounter: Payer: Self-pay | Admitting: Family Medicine

## 2020-05-23 DIAGNOSIS — N183 Chronic kidney disease, stage 3 unspecified: Secondary | ICD-10-CM | POA: Insufficient documentation

## 2020-05-24 ENCOUNTER — Encounter: Payer: Self-pay | Admitting: Emergency Medicine

## 2020-05-27 ENCOUNTER — Other Ambulatory Visit: Payer: Self-pay | Admitting: *Deleted

## 2020-05-27 MED ORDER — CARVEDILOL 12.5 MG PO TABS: 12.5000 mg | ORAL_TABLET | Freq: Two times a day (BID) | ORAL | 0 refills | Status: DC

## 2020-05-31 ENCOUNTER — Telehealth: Payer: Self-pay | Admitting: Cardiology

## 2020-05-31 NOTE — Telephone Encounter (Signed)
Patient states she would like to switch back to Metoprolol. Please call to discuss.

## 2020-06-01 ENCOUNTER — Other Ambulatory Visit: Payer: Self-pay | Admitting: *Deleted

## 2020-06-01 MED ORDER — METOPROLOL TARTRATE 100 MG PO TABS
100.0000 mg | ORAL_TABLET | Freq: Two times a day (BID) | ORAL | 3 refills | Status: DC
Start: 2020-06-01 — End: 2020-08-08

## 2020-06-01 MED ORDER — CARVEDILOL 12.5 MG PO TABS: 12.5000 mg | ORAL_TABLET | Freq: Two times a day (BID) | ORAL | 0 refills | Status: DC

## 2020-06-01 NOTE — Telephone Encounter (Signed)
Called patient and informed her of Dr. Merita Norton recommendation as copied below:  Stop Coreg, restart Lopressor 100 mg twice daily as per patient's request.   Patient verbalized understanding and agreed with plan.

## 2020-06-06 ENCOUNTER — Other Ambulatory Visit: Payer: Self-pay

## 2020-06-06 ENCOUNTER — Ambulatory Visit: Payer: Medicare Other | Admitting: Cardiology

## 2020-06-06 ENCOUNTER — Encounter: Payer: Self-pay | Admitting: Cardiology

## 2020-06-06 VITALS — BP 168/100 | HR 65 | Ht 60.0 in | Wt 127.5 lb

## 2020-06-06 DIAGNOSIS — I739 Peripheral vascular disease, unspecified: Secondary | ICD-10-CM

## 2020-06-06 DIAGNOSIS — I10 Essential (primary) hypertension: Secondary | ICD-10-CM | POA: Diagnosis not present

## 2020-06-06 DIAGNOSIS — E785 Hyperlipidemia, unspecified: Secondary | ICD-10-CM

## 2020-06-06 DIAGNOSIS — I2510 Atherosclerotic heart disease of native coronary artery without angina pectoris: Secondary | ICD-10-CM | POA: Diagnosis not present

## 2020-06-06 NOTE — Progress Notes (Signed)
Cardiology Office Note:    Date:  06/06/2020   ID:  Elizabeth [name], DOB Jul 15, 1941, MRN [id number]  PCP:  Danelle Berry, PA-C  Cardiologist:  Debbe Odea, MD  Electrophysiologist:  None   Referring MD: Danelle Berry, PA-C   Chief Complaint  Patient presents with  . other    1 month f/u no complaints today. Meds reviewed verbally with pt.    History of Present Illness:    Elizabeth [name] is a 78 y.o. 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    She was last seen with elevated blood pressure.  She was started on Coreg after last visit but could not tolerate.  Previously was intolerant to hydralazine.  She is tolerant to Lopressor, which was restarted.  She states feeling much better since starting Lopressor.  She takes 100 mg in the morning and 50/100 mg in the evening depending on blood pressure.  She has done this for years and is comfortable with this approach.  She checks her blood pressure frequently at home and is usually around 120s/  60.   Historical notes Patient used to be seen by Dr. Gery Pray at [place].  Patient wanted to establish care at the [facility] practice.  In October 2019, she underwent a left heart cath showing multivessel disease but was not seen as a candidate for revascularization.  Medical therapy was recommended.  The plan is if she develops symptoms in the future, high risk PCI may be considered.ast echocardiogram on 05/2018 showed normal ejection fraction with EF 60 to 65%  She is being followed by vascular surgery for management of AAA, carotid and peripheral vascular disease.   Patient history of intolerance to hydralazine due to dizziness, was also started on carvedilol due to elevated blood pressures but she could not  tolerate this.   Past Medical History:  Diagnosis Date  . 3-vessel CAD    05/2018 LHC with a single remaining conduit which is the left main coronary artery supplying the LAD.  Both the circumflex and right coronary artery were occluded.  The distal left main, proximal LAD is aneurysmal and heavily calcified.  The LAD supplies well-formed collaterals to the PDA.  Not felt to be candidate for revascularization and with recommendation for medical mgmt  . AAA (abdominal aortic aneurysm) (HCC)    4.3cm (12/2018)  . Celiac artery stenosis (HCC)   . Degenerative joint disease of left hip   . Heart failure with preserved ejection fraction (HCC)    EF 60-65%, 05/2018  . History of non-ST elevation myocardial infarction (NSTEMI)    05/2018 with subsequent LHC and in the setting of acute aortic ulcer and hematoma  . Hyperlipidemia   . Hypertension   . Internal carotid artery stenosis, left    80-99% 05/2018  . Intramural hematoma of thoracic aorta (HCC)    05/2018  . Mitral regurgitation    Mild by 05/2018 echo  . Paroxysmal SVT (supraventricular tachycardia) (HCC)    05/2018  . Peripheral vascular disease (HCC)    extensive vascular dz s/p b/l carotid endarterectomies (2014) and iliac stenting, AAA, aortic ulcer  . Right renal artery stenosis (HCC)    05/2018  . Smoking hx    Quit ~4665  . Status post bilateral carotid endarterectomy  Followed by Vascular surgery with most recent images 05/2018 showing L ICA 80-99% stenosis  . Subclavian artery stenosis, left (HCC)    05/2018    Past Surgical History:  Procedure Laterality Date  . APPENDECTOMY    . CARDIAC CATHETERIZATION    . HIP SURGERY Left    x3  . LEFT HEART CATH AND CORONARY ANGIOGRAPHY N/A 05/19/2018   Procedure: LEFT HEART CATH AND CORONARY ANGIOGRAPHY;  Surgeon: Lyn Records, MD;  Location: [facility] INVASIVE CV LAB;  Service: Cardiovascular;  Laterality: N/A;  . PERIPHERAL VASCULAR CATHETERIZATION Right 02/02/2016    Procedure: Lower Extremity Angiography;  Surgeon: Annice Needy, MD;  Location: [facility] INVASIVE CV LAB;  Service: Cardiovascular;  Laterality: Right;    Current Medications: Current Meds  Medication Sig  . acetaminophen (TYLENOL) 500 MG tablet Take 1,000 mg by mouth 2 (two) times daily as needed for moderate pain.  [name] amLODipine (NORVASC) 10 MG tablet Take 10 mg by mouth daily.  [name] aspirin EC 81 MG tablet Take 1 tablet (81 mg total) by mouth daily. Swallow whole.  . busPIRone (BUSPAR) 5 MG tablet Take 1-2 tablets (5-10 mg total) by mouth 2 (two) times daily as needed (anxiety/nerves/panic).  . cholecalciferol (VITAMIN D) 1000 units tablet Take 1,000 Units by mouth daily.  . citalopram (CELEXA) 10 MG tablet Take 1 tablet (10 mg total) by mouth at bedtime.  . clopidogrel (PLAVIX) 75 MG tablet Take 1 tablet (75 mg total) by mouth daily.  . Cyanocobalamin (VITAMIN B-12 PO) Take 1 tablet by mouth daily.  [name] glucosamine-chondroitin 500-400 MG tablet Take 1 tablet by mouth daily.  [name] HYDROcodone-acetaminophen (NORCO) 10-325 MG tablet Take 1 tablet by mouth every 4 (four) hours as needed.  . isosorbide mononitrate (IMDUR) 30 MG 24 hr tablet Take 0.5 tablets (15 mg total) by mouth daily.  [name] lisinopril (ZESTRIL) 40 MG tablet TAKE 1 TABLET BY MOUTH EVERY DAY  . metoprolol tartrate (LOPRESSOR) 100 MG tablet Take 1 tablet (100 mg total) by mouth 2 (two) times daily. (Patient taking differently: Take 100 mg by mouth in the morning, at noon, and at bedtime. )  . Multiple Vitamin (MULTIVITAMIN) tablet Take 1 tablet by mouth daily.  . nitroGLYCERIN (NITROSTAT) 0.4 MG SL tablet Place 1 tablet (0.4 mg total) under the tongue every 5 (five) minutes as needed for chest pain.  . potassium citrate (UROCIT-K) 10 MEQ (1080 MG) SR tablet Take 10 mEq by mouth 2 (two) times daily.  . rosuvastatin (CRESTOR) 40 MG tablet Take 1 tablet (40 mg total) by mouth daily.  . [DISCONTINUED] amLODipine (NORVASC) 10 MG tablet TAKE 1 TABLET  BY MOUTH EVERY DAY     Allergies:   Codeine and Sulfa antibiotics   Social History   Socioeconomic History  . Marital status: Widowed    Spouse name: Not on file  . Number of children: 2  . Years of education: Not on file  . Highest education level: GED or equivalent  Occupational History  . Occupation: diabled  Tobacco Use  . Smoking status: Former Smoker    Quit date: 02/02/1999    Years since quitting: 21.3  . Smokeless tobacco: Never Used  Vaping Use  . Vaping Use: Never used  Substance and Sexual Activity  . Alcohol use: No  . Drug use: No  . Sexual activity: Not Currently  Other Topics Concern  . Not on file  Social History Narrative   Pt lives alone, does not drive but sister drives  her when needed.    Social Determinants of Health   Financial Resource Strain: Low Risk   . Difficulty of Paying Living Expenses: Not hard at all  Food Insecurity: No Food Insecurity  . Worried About Programme researcher, broadcasting/film/videounning Out of Food in the Last Year: Never true  . Ran Out of Food in the Last Year: Never true  Transportation Needs: No Transportation Needs  . Lack of Transportation (Medical): No  . Lack of Transportation (Non-Medical): No  Physical Activity: Inactive  . Days of Exercise per Week: 0 days  . Minutes of Exercise per Session: 0 min  Stress: No Stress Concern Present  . Feeling of Stress : Not at all  Social Connections: Moderately Isolated  . Frequency of Communication with Friends and Family: More than three times a week  . Frequency of Social Gatherings with Friends and Family: More than three times a week  . Attends Religious Services: More than 4 times per year  . Active Member of Clubs or Organizations: No  . Attends BankerClub or Organization Meetings: Never  . Marital Status: Widowed     Family History: The patient's family history includes Heart disease in her son; Hodgkin's lymphoma in her son; Hyperlipidemia in her sister; Hypertension in her father, mother, and sister;  Kidney disease in her son; Stroke in her father and mother. There is no history of Mental illness.  ROS:   Please see the history of present illness.     All other systems reviewed and are negative.  EKGs/Labs/Other Studies Reviewed:    The following studies were reviewed today: 05/2018 LHC  Total occlusion of the mid to distal RCA. Distal RCA fills via well-formed collaterals around the left ventricular apex from the LAD.  Total occlusion of a relatively small circumflex. Minimal collaterals are noted.  Ostial 60% left main with pressure damping noted during engagement. At least 60% distal left main is noted.  Heavily calcified 80% proximal LAD followed by eccentric calcified slitlike 70% mid LAD stenosis.  Normal LV function with EF 55%.  The patient has a single remaining conduit which is the left main coronary artery supplying the LAD. Both the circumflex and right coronary are occluded. The distal left main, proximal LAD is aneurysmal and heavily calcified. The LAD supplies well-formed collaterals to the PDA.  Recommend evaluation for potential surgical revascularization.  No appealing interventional options exist.  Carotid US 05/19/2018  Summary: Right Carotid: There is no evidence of stenosis in the right ICA. Patent CEA.  Left Carotid: Velocities in the left ICA are consistent with a 80-99% stenosis. Significant acoustic shadowing and the bifurcation.  Vertebrals: Bilateral vertebral arteries demonstrate antegrade flow. Subclavians: Normal flow hemodynamics were seen in bilateral subclavian arteries. *See table(s) above for measurements and observations.  Echo 05/20/18: Study Conclusions - Left ventricle: The cavity size was normal. Wall thickness was normal. Systolic function was normal. The estimated ejection fraction was in the range of60% to 65%. Hypokinesisof the mid-apicalinferolateral myocardium; consistent with  ischemia in the distribution of the right coronary or left circumflex coronary artery. Doppler parameters are consistent with abnormal left ventricular relaxation (grade 1 diastolic dysfunction). - Mitral valve: There was mild regurgitation.  EKG:  EKG is  ordered today.  The ekg ordered today demonstrates normal sinus rhythm, rate 65.  Recent Labs: 03/13/2020: TSH 2.276 04/28/2020: ALT 16; Hemoglobin 15.1; Platelets 217 05/19/2020: BUN 29; Creat 1.20; Potassium 4.7; Sodium 139  Recent Lipid Panel    Component Value Date/Time   CHOL  162 10/02/2019 0917   TRIG 201 (H) 10/02/2019 0917   HDL 42 (L) 10/02/2019 0917   CHOLHDL 3.9 10/02/2019 0917   VLDL 28 05/20/2018 0249   LDLCALC 90 10/02/2019 0917    Physical Exam:    VS:  BP (!) 168/100 (BP Location: Left Arm, Patient Position: Sitting, Cuff Size: Normal)   Pulse 65   Ht 5' (1.524 m)   Wt 127 lb 8 oz (57.8 kg)   SpO2 96%   BMI 24.90 kg/m     Wt Readings from Last 3 Encounters:  06/06/20 127 lb 8 oz (57.8 kg)  05/05/20 128 lb (58.1 kg)  04/28/20 127 lb 11.2 oz (57.9 kg)     GEN:  Well nourished, well developed in no acute distress HEENT: Normal NECK: No JVD; bilateral carotid bruits noted LYMPHATICS: No lymphadenopathy CARDIAC: RRR, 2/6 systolic murmur at right upper sternal border RESPIRATORY:  Clear to auscultation without rales, wheezing or rhonchi  ABDOMEN: Soft, non-tender, non-distended MUSCULOSKELETAL:  No edema; No deformity  SKIN: Warm and dry NEUROLOGIC:  Alert and oriented x 3 PSYCHIATRIC:  Normal affect   ASSESSMENT:   . 1. Essential hypertension   2. Coronary artery disease involving native coronary artery of native heart without angina pectoris   3. Peripheral vascular disease (HCC)   4. Hyperlipidemia LDL goal <70    PLAN:    In order of problems listed above:  1. Patient with history of hypertension.  BP elevated today, usually normal at home.  She likely has a component of whitecoat  syndrome.  Continue Lopressor, Imdur, Norvasc, lisinopril.  Chlorthalidone previously stopped due to renal dysfunction. 2. Multivessel CAD, not candidate for PCI .  Continue  aspirin Plavix, Crestor imdur.  Last ejection fraction was normal with grade 1 diastolic dysfunction.  3. Hx of PAD ( CEA, left common iliac stenting).  Continue aspirin, Plavix, Crestor.  Follows up with vascular surgery.  4. History of hyperlipidemia, Crestor 40 mg daily  Follow-up in 6 month.  Total encounter time 35 minutes  Greater than 50% was spent in counseling and coordination of care with the patient   This note was generated in part or whole with voice recognition software. Voice recognition is usually quite accurate but there are transcription errors that can and very often do occur. I apologize for any typographical errors that were not detected and corrected.  Medication Adjustments/Labs and Tests Ordered: Current medicines are reviewed at length with the patient today.  Concerns regarding medicines are outlined above.  Orders Placed This Encounter  Procedures  . EKG 12-Lead   No orders of the defined types were placed in this encounter.   Patient Instructions  Medication Instructions:  Your physician recommends that you continue on your current medications as directed. Please refer to the Current Medication list given to you today.  *If you need a refill on your cardiac medications before your next appointment, please call your pharmacy*   Lab Work: None Ordered If you have labs (blood work) drawn today and your tests are completely normal, you will receive your results only by: [name] MyChart Message (if you have MyChart) OR . A paper copy in the mail If you have any lab test that is abnormal or we need to change your treatment, we will call you to review the results.   Testing/Procedures: None Ordered   Follow-Up: At [facility], you and your health needs are our priority.  As part of our  continuing mission to provide  you with exceptional heart care, we have created designated Provider Care Teams.  These Care Teams include your primary Cardiologist (physician) and Advanced Practice Providers (APPs -  Physician Assistants and Nurse Practitioners) who all work together to provide you with the care you need, when you need it.  We recommend signing up for the patient portal called "MyChart".  Sign up information is provided on this After Visit Summary.  MyChart is used to connect with patients for Virtual Visits (Telemedicine).  Patients are able to view lab/test results, encounter notes, upcoming appointments, etc.  Non-urgent messages can be sent to your provider as well.   To learn more about what you can do with MyChart, go to [email or web address].    Your next appointment:   6 month(s)  The format for your next appointment:   In Person  Provider:   Debbe Odea, MD   Other Instructions      Signed, Debbe Odea, MD  06/06/2020 12:25 PM    [facility]

## 2020-06-06 NOTE — Patient Instructions (Signed)

## 2020-06-13 ENCOUNTER — Other Ambulatory Visit: Payer: Self-pay | Admitting: Family Medicine

## 2020-06-13 DIAGNOSIS — F419 Anxiety disorder, unspecified: Secondary | ICD-10-CM

## 2020-06-21 ENCOUNTER — Other Ambulatory Visit: Payer: Self-pay

## 2020-06-21 ENCOUNTER — Encounter: Payer: Self-pay | Admitting: Student in an Organized Health Care Education/Training Program

## 2020-06-21 ENCOUNTER — Ambulatory Visit
Payer: Medicare Other | Attending: Student in an Organized Health Care Education/Training Program | Admitting: Student in an Organized Health Care Education/Training Program

## 2020-06-21 VITALS — BP 162/84 | HR 55 | Temp 97.1°F | Resp 16 | Ht 60.0 in | Wt 128.0 lb

## 2020-06-21 DIAGNOSIS — G8929 Other chronic pain: Secondary | ICD-10-CM | POA: Diagnosis not present

## 2020-06-21 DIAGNOSIS — I25118 Atherosclerotic heart disease of native coronary artery with other forms of angina pectoris: Secondary | ICD-10-CM

## 2020-06-21 DIAGNOSIS — M1652 Unilateral post-traumatic osteoarthritis, left hip: Secondary | ICD-10-CM | POA: Diagnosis not present

## 2020-06-21 DIAGNOSIS — G8921 Chronic pain due to trauma: Secondary | ICD-10-CM

## 2020-06-21 DIAGNOSIS — Z79891 Long term (current) use of opiate analgesic: Secondary | ICD-10-CM

## 2020-06-21 DIAGNOSIS — Z9889 Other specified postprocedural states: Secondary | ICD-10-CM

## 2020-06-21 DIAGNOSIS — I739 Peripheral vascular disease, unspecified: Secondary | ICD-10-CM

## 2020-06-21 DIAGNOSIS — M25552 Pain in left hip: Secondary | ICD-10-CM | POA: Diagnosis not present

## 2020-06-21 MED ORDER — HYDROCODONE-ACETAMINOPHEN 10-325 MG PO TABS: 1.0000 | ORAL_TABLET | ORAL | 0 refills | Status: DC | PRN

## 2020-06-21 NOTE — Progress Notes (Signed)
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 nitroglycerin, and [DISCONTINUED] chlorthalidone.  Pharmacotherapy (Medications Ordered): Meds ordered this encounter  Medications  . HYDROcodone-acetaminophen (NORCO) 10-325 MG tablet    Sig: Take 1 tablet by mouth every 4 (four) hours as needed for severe pain. Must last 30 days.    Dispense:  180 tablet    Refill:  0    Chronic Pain. (STOP Act - Not applicable). Fill one day early if closed on scheduled refill date.  [name] HYDROcodone-acetaminophen (NORCO) 10-325 MG tablet    Sig: Take 1 tablet by mouth every 4 (four) hours as needed for severe pain. Must last 30 days.    Dispense:  180 tablet    Refill:  0    Chronic Pain. (STOP Act - Not applicable). Fill one day early if closed on scheduled refill date.  [name] HYDROcodone-acetaminophen (NORCO) 10-325 MG tablet    Sig: Take 1 tablet by mouth every 4 (four) hours as needed for severe pain. Must last 30 days.     Dispense:  180 tablet    Refill:  0    Chronic Pain. (STOP Act - Not applicable). Fill one day early if closed on scheduled refill date.    Follow-up plan:   Return in about 3 months (around 09/21/2020) for Medication Management, in person.   Recent Visits No visits were found meeting these conditions. Showing recent visits within past 90 days and meeting all other requirements Today's Visits Date Type Provider Dept  06/21/20 Office Visit Gillis Santa, MD [facility]  Showing today's visits and meeting all other requirements Future Appointments No visits were found meeting these conditions. Showing future appointments within next 90 days and meeting all other requirements  I discussed the assessment and treatment plan with the patient. The patient was provided an opportunity to ask questions and all were answered. The patient agreed with the plan and demonstrated an understanding of the instructions.  Patient advised to call back or seek an in-person evaluation if the symptoms or condition worsens.  Duration of encounter: 30 minutes.  Note by: Gillis Santa, MD Date: 06/21/2020; Time: 10:44 AM

## 2020-06-21 NOTE — Progress Notes (Signed)
Safety precautions to be maintained throughout the outpatient stay will include: orient to surroundings, keep bed in low position, maintain call bell within reach at all times, provide assistance with transfer out of bed and ambulation.   Nursing Pain Medication Assessment:  Safety precautions to be maintained throughout the outpatient stay will include: orient to surroundings, keep bed in low position, maintain call bell within reach at all times, provide assistance with transfer out of bed and ambulation.  Medication Inspection Compliance: Pill count conducted under aseptic conditions, in front of the patient. Neither the pills nor the bottle was removed from the patient's sight at any time. Once count was completed pills were immediately returned to the patient in their original bottle.  Medication: Hydrocodone/APAP Pill/Patch Count: 11 of 180 pills remain Pill/Patch Appearance: Markings consistent with prescribed medication Bottle Appearance: Standard pharmacy container. Clearly labeled. Filled Date: 10 / 17 / 2021 Last Medication intake:  Today

## 2020-07-14 ENCOUNTER — Other Ambulatory Visit: Payer: Self-pay | Admitting: Family Medicine

## 2020-07-14 DIAGNOSIS — I10 Essential (primary) hypertension: Secondary | ICD-10-CM

## 2020-07-14 DIAGNOSIS — I25118 Atherosclerotic heart disease of native coronary artery with other forms of angina pectoris: Secondary | ICD-10-CM

## 2020-07-21 ENCOUNTER — Other Ambulatory Visit: Payer: Self-pay | Admitting: Family Medicine

## 2020-07-21 DIAGNOSIS — F419 Anxiety disorder, unspecified: Secondary | ICD-10-CM

## 2020-08-04 ENCOUNTER — Ambulatory Visit (INDEPENDENT_AMBULATORY_CARE_PROVIDER_SITE_OTHER): Payer: Medicare Other | Admitting: Family Medicine

## 2020-08-04 ENCOUNTER — Other Ambulatory Visit: Payer: Self-pay | Admitting: Family Medicine

## 2020-08-04 ENCOUNTER — Other Ambulatory Visit: Payer: Self-pay

## 2020-08-04 ENCOUNTER — Encounter: Payer: Self-pay | Admitting: Family Medicine

## 2020-08-04 ENCOUNTER — Telehealth: Payer: Self-pay

## 2020-08-04 VITALS — BP 130/70 | HR 72 | Temp 98.3°F | Resp 16 | Ht 60.0 in | Wt 130.0 lb

## 2020-08-04 DIAGNOSIS — E875 Hyperkalemia: Secondary | ICD-10-CM

## 2020-08-04 DIAGNOSIS — D531 Other megaloblastic anemias, not elsewhere classified: Secondary | ICD-10-CM | POA: Diagnosis not present

## 2020-08-04 DIAGNOSIS — E539 Vitamin B deficiency, unspecified: Secondary | ICD-10-CM

## 2020-08-04 DIAGNOSIS — F419 Anxiety disorder, unspecified: Secondary | ICD-10-CM | POA: Diagnosis not present

## 2020-08-04 DIAGNOSIS — R000 Tachycardia, unspecified: Secondary | ICD-10-CM | POA: Diagnosis not present

## 2020-08-04 DIAGNOSIS — I10 Essential (primary) hypertension: Secondary | ICD-10-CM

## 2020-08-04 DIAGNOSIS — I739 Peripheral vascular disease, unspecified: Secondary | ICD-10-CM

## 2020-08-04 DIAGNOSIS — D7589 Other specified diseases of blood and blood-forming organs: Secondary | ICD-10-CM | POA: Diagnosis not present

## 2020-08-04 DIAGNOSIS — Z5181 Encounter for therapeutic drug level monitoring: Secondary | ICD-10-CM

## 2020-08-04 DIAGNOSIS — I25118 Atherosclerotic heart disease of native coronary artery with other forms of angina pectoris: Secondary | ICD-10-CM | POA: Diagnosis not present

## 2020-08-04 DIAGNOSIS — E785 Hyperlipidemia, unspecified: Secondary | ICD-10-CM

## 2020-08-04 DIAGNOSIS — Z7902 Long term (current) use of antithrombotics/antiplatelets: Secondary | ICD-10-CM

## 2020-08-04 DIAGNOSIS — N183 Chronic kidney disease, stage 3 unspecified: Secondary | ICD-10-CM

## 2020-08-04 MED ORDER — ROSUVASTATIN CALCIUM 40 MG PO TABS: 40.0000 mg | ORAL_TABLET | Freq: Every day | ORAL | 3 refills | Status: DC

## 2020-08-04 NOTE — Telephone Encounter (Signed)
Pharmacy requesting refill of metoprolol tartrate 100mg  tablets. Patient states she is taking it three times daily but it is only prescribed as one tablet BID. She was seen recently but Dr. did not address the change in his last office note. Please advise.  1. Which medications need to be refilled? (please list name of each medication and dose if known) metoprolol tartrate  2. Which pharmacy/location (including street and city if local pharmacy) is medication to be sent to? [facility]  3. Do they need a 30 day or 90 day supply? 30

## 2020-08-04 NOTE — Telephone Encounter (Signed)
Dr. Azucena Cecil- Can you please clarify dose on this refill. I only see BID mentioned in your OV note.
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
## 2020-08-05 LAB — COMPLETE METABOLIC PANEL WITH GFR
AG Ratio: 1.6 (calc) (ref 1.0–2.5)
ALT: 18 U/L (ref 6–29)
AST: 23 U/L (ref 10–35)
Albumin: 4.5 g/dL (ref 3.6–5.1)
Alkaline phosphatase (APISO): 61 U/L (ref 37–153)
BUN/Creatinine Ratio: 25 (calc) — ABNORMAL HIGH (ref 6–22)
BUN: 28 mg/dL — ABNORMAL HIGH (ref 7–25)
CO2: 28 mmol/L (ref 20–32)
Calcium: 9.8 mg/dL (ref 8.6–10.4)
Chloride: 104 mmol/L (ref 98–110)
Creat: 1.13 mg/dL — ABNORMAL HIGH (ref 0.60–0.93)
GFR, Est African American: 54 mL/min/{1.73_m2} — ABNORMAL LOW (ref >=60)
GFR, Est Non African American: 47 mL/min/{1.73_m2} — ABNORMAL LOW (ref >=60)
Globulin: 2.9 g/dL (calc) (ref 1.9–3.7)
Glucose, Bld: 89 mg/dL (ref 65–99)
Potassium: 4.9 mmol/L (ref 3.5–5.3)
Sodium: 138 mmol/L (ref 135–146)
Total Bilirubin: 0.4 mg/dL (ref 0.2–1.2)
Total Protein: 7.4 g/dL (ref 6.1–8.1)

## 2020-08-05 LAB — LIPID PANEL
Cholesterol: 160 mg/dL (ref <200)
HDL: 44 mg/dL — ABNORMAL LOW (ref >=50)
LDL Cholesterol (Calc): 83 mg/dL (calc)
Non-HDL Cholesterol (Calc): 116 mg/dL (calc) (ref <130)
Total CHOL/HDL Ratio: 3.6 (calc) (ref <5.0)
Triglycerides: 238 mg/dL — ABNORMAL HIGH (ref <150)

## 2020-08-05 LAB — CBC WITH DIFFERENTIAL/PLATELET
Absolute Monocytes: 539 cells/uL (ref 200–950)
Basophils Absolute: 31 cells/uL (ref 0–200)
Basophils Relative: 0.5 %
Eosinophils Absolute: 198 cells/uL (ref 15–500)
Eosinophils Relative: 3.2 %
HCT: 44 % (ref 35.0–45.0)
Hemoglobin: 15 g/dL (ref 11.7–15.5)
Lymphs Abs: 1841 cells/uL (ref 850–3900)
MCH: 34.7 pg — ABNORMAL HIGH (ref 27.0–33.0)
MCHC: 34.1 g/dL (ref 32.0–36.0)
MCV: 101.9 fL — ABNORMAL HIGH (ref 80.0–100.0)
MPV: 11.3 fL (ref 7.5–12.5)
Monocytes Relative: 8.7 %
Neutro Abs: 3590 cells/uL (ref 1500–7800)
Neutrophils Relative %: 57.9 %
Platelets: 196 10*3/uL (ref 140–400)
RBC: 4.32 10*6/uL (ref 3.80–5.10)
RDW: 11.8 % (ref 11.0–15.0)
Total Lymphocyte: 29.7 %
WBC: 6.2 10*3/uL (ref 3.8–10.8)

## 2020-08-05 LAB — TSH: TSH: 1.43 mIU/L (ref 0.40–4.50)

## 2020-08-05 LAB — VITAMIN B12: Vitamin B-12: >2000 pg/mL — ABNORMAL HIGH (ref 200–1100)

## 2020-08-08 MED ORDER — METOPROLOL TARTRATE 100 MG PO TABS
100.0000 mg | ORAL_TABLET | Freq: Three times a day (TID) | ORAL | 1 refills | Status: DC
Start: 2020-08-08 — End: 2021-02-17

## 2020-08-08 NOTE — Addendum Note (Signed)
Addended by: Thayer Headings, Pax Reasoner L on: 08/08/2020 08:04 AM   Modules accepted: Orders

## 2020-08-08 NOTE — Telephone Encounter (Signed)
Requested Prescriptions   Signed Prescriptions Disp Refills   metoprolol tartrate (LOPRESSOR) 100 MG tablet 90 tablet 1    Sig: Take 1 tablet (100 mg total) by mouth 3 (three) times daily.    Authorizing Provider: Debbe Odea    Ordering User: Thayer Headings, Neetu Carrozza L

## 2020-08-12 ENCOUNTER — Ambulatory Visit: Payer: Medicare Other | Admitting: Vascular Surgery

## 2020-08-12 ENCOUNTER — Ambulatory Visit (HOSPITAL_COMMUNITY)
Admission: RE | Admit: 2020-08-12 | Discharge: 2020-08-12 | Disposition: A | Discharge status: Home / Self Care | Payer: Medicare Other | Source: Ambulatory Visit | Attending: Vascular Surgery | Admitting: Vascular Surgery

## 2020-08-12 ENCOUNTER — Other Ambulatory Visit: Payer: Self-pay

## 2020-08-12 ENCOUNTER — Ambulatory Visit (INDEPENDENT_AMBULATORY_CARE_PROVIDER_SITE_OTHER)
Admission: RE | Admit: 2020-08-12 | Discharge: 2020-08-12 | Disposition: A | Discharge status: Home / Self Care | Payer: Medicare Other | Source: Ambulatory Visit | Attending: Vascular Surgery | Admitting: Vascular Surgery

## 2020-08-12 ENCOUNTER — Encounter: Payer: Self-pay | Admitting: Vascular Surgery

## 2020-08-12 VITALS — BP 143/84 | HR 60 | Temp 98.2°F | Resp 20 | Ht 60.0 in | Wt 130.0 lb

## 2020-08-12 DIAGNOSIS — R0989 Other specified symptoms and signs involving the circulatory and respiratory systems: Secondary | ICD-10-CM | POA: Diagnosis not present

## 2020-08-12 DIAGNOSIS — I714 Abdominal aortic aneurysm, without rupture, unspecified: Secondary | ICD-10-CM

## 2020-08-12 DIAGNOSIS — I739 Peripheral vascular disease, unspecified: Secondary | ICD-10-CM

## 2020-08-12 DIAGNOSIS — I6523 Occlusion and stenosis of bilateral carotid arteries: Secondary | ICD-10-CM | POA: Diagnosis not present

## 2020-08-12 NOTE — Progress Notes (Signed)
Patient ID: Elizabeth [name], female   DOB: September 25, 1940, 80 y.o.   MRN: [id number]  Reason for Consult: Follow-up   Referred by Delsa Grana, PA-C  Subjective:     HPI:  Elizabeth [name] is a 80 y.o. female history of type B intermural hematoma as well as abdominal aortic aneurysm.  Previously had bilateral carotid endarterectomies performed in [place].  She also has a stent of her left common iliac artery.  She is here today to follow-up with EVAR duplex as well as carotid duplex.  She has no new strokes.  Last visit was May of last year.  She has no new complaints today.  She remains active she is able to walk with a cane.  She is accompanied by her granddaughter today.  Past Medical History:  Diagnosis Date  . 3-vessel CAD    05/2018 LHC with a single remaining conduit which is the left main coronary artery supplying the LAD.  Both the circumflex and right coronary artery were occluded.  The distal left main, proximal LAD is aneurysmal and heavily calcified.  The LAD supplies well-formed collaterals to the PDA.  Not felt to be candidate for revascularization and with recommendation for medical mgmt  . AAA (abdominal aortic aneurysm) ([place])    4.3cm (12/2018)  . Celiac artery stenosis ([place])   . Degenerative joint disease of left hip   . Heart failure with preserved ejection fraction ([place])    EF 60-65%, 05/2018  . History of non-ST elevation myocardial infarction (NSTEMI)    05/2018 with subsequent LHC and in the setting of acute aortic ulcer and hematoma  . Hyperlipidemia   . Hypertension   . Internal carotid artery stenosis, left    80-99% 05/2018  . 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 Mitral regurgitation    Mild by 05/2018 echo  . Paroxysmal SVT (supraventricular tachycardia) ([place])    05/2018  . Peripheral vascular disease ([place])    extensive vascular dz s/p b/l carotid endarterectomies (2014) and iliac stenting, AAA, aortic ulcer  . 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 Smoking hx    Quit ~3016  . Status post bilateral carotid endarterectomy    Followed by Vascular surgery with most recent images 05/2018 showing L ICA 80-99% stenosis  . Subclavian artery stenosis, left ([place])    05/2018   Family History  Problem Relation Age of Onset  . Hypertension Mother   . Stroke Mother   . Hypertension Father   . Stroke Father   . Hypertension Sister   . Hyperlipidemia Sister   . Hodgkin's lymphoma Son   . Heart disease Son   . Kidney disease Son   . Mental illness Neg Hx    Past Surgical History:  Procedure Laterality Date  . APPENDECTOMY    . CARDIAC CATHETERIZATION    . HIP SURGERY Left    x3  . LEFT HEART CATH AND CORONARY ANGIOGRAPHY N/A 05/19/2018   Procedure: LEFT HEART CATH AND CORONARY ANGIOGRAPHY;  Surgeon: Belva Crome, MD;  Location: [facility];  Service: Cardiovascular;  Laterality: N/A;  . PERIPHERAL VASCULAR CATHETERIZATION Right 02/02/2016   Procedure: Lower Extremity Angiography;  Surgeon: Algernon Huxley, MD;  Location: [facility];  Service: Cardiovascular;  Laterality: Right;    Short Social History:  Social History   Tobacco Use  . Smoking status: Former Smoker    Quit date: 02/02/1999  Years since quitting: 21.5  . Smokeless tobacco: Never Used  Substance Use Topics  . Alcohol use: No    Allergies  Allergen Reactions  . Codeine Other (See Comments)    Reaction: Unsure  . Sulfa Antibiotics Other (See Comments)    Mouth blisters    Current Outpatient Medications  Medication Sig Dispense Refill  . acetaminophen (TYLENOL) 500 MG tablet Take 1,000 mg by mouth 2 (two) times daily as needed for moderate pain.    [name] amLODipine (NORVASC) 10 MG tablet TAKE 1 TABLET BY MOUTH EVERY DAY 90 tablet 1  . aspirin EC 81 MG tablet Take 1 tablet (81 mg total) by mouth daily. Swallow whole. 90 tablet 3  . busPIRone (BUSPAR) 5 MG tablet TAKE 1-2 TABLETS (5-10 MG TOTAL) BY MOUTH 2 (TWO) TIMES  DAILY AS NEEDED (ANXIETY/NERVES/PANIC). 180 tablet 1  . cholecalciferol (VITAMIN D) 1000 units tablet Take 1,000 Units by mouth daily.    . citalopram (CELEXA) 10 MG tablet Take 1 tablet (10 mg total) by mouth at bedtime. 90 tablet 3  . clopidogrel (PLAVIX) 75 MG tablet TAKE 1 TABLET BY MOUTH EVERY DAY 90 tablet 3  . Cyanocobalamin (VITAMIN B-12 PO) Take 1 tablet by mouth daily.    [name] glucosamine-chondroitin 500-400 MG tablet Take 1 tablet by mouth daily.    [name] HYDROcodone-acetaminophen (NORCO) 10-325 MG tablet Take 1 tablet by mouth every 4 (four) hours as needed for severe pain. Must last 30 days. 180 tablet 0  . [START ON 08/21/2020] HYDROcodone-acetaminophen (NORCO) 10-325 MG tablet Take 1 tablet by mouth every 4 (four) hours as needed for severe pain. Must last 30 days. 180 tablet 0  . isosorbide mononitrate (IMDUR) 30 MG 24 hr tablet TAKE 0.5 TABLETS (15 MG TOTAL) BY MOUTH DAILY. 45 tablet 1  . lisinopril (ZESTRIL) 40 MG tablet TAKE 1 TABLET BY MOUTH EVERY DAY 90 tablet 1  . metoprolol tartrate (LOPRESSOR) 100 MG tablet Take 1 tablet (100 mg total) by mouth 3 (three) times daily. 90 tablet 1  . Multiple Vitamin (MULTIVITAMIN) tablet Take 1 tablet by mouth daily.    . potassium citrate (UROCIT-K) 10 MEQ (1080 MG) SR tablet Take 10 mEq by mouth 2 (two) times daily.  12  . rosuvastatin (CRESTOR) 40 MG tablet Take 1 tablet (40 mg total) by mouth daily. 90 tablet 3  . nitroGLYCERIN (NITROSTAT) 0.4 MG SL tablet Place 1 tablet (0.4 mg total) under the tongue every 5 (five) minutes as needed for chest pain. 25 tablet 3   No current facility-administered medications for this visit.    Review of Systems  Constitutional:  Constitutional negative. HENT: HENT negative.  Eyes: Eyes negative.  Respiratory: Respiratory negative.  Cardiovascular: Cardiovascular negative.  GI: Gastrointestinal negative.  Musculoskeletal: Musculoskeletal negative.  Skin: Skin negative.  Neurological: Neurological  negative. Hematologic: Hematologic/lymphatic negative.  Psychiatric: Psychiatric negative.        Objective:  Objective   Vitals:   08/12/20 0918  BP: (!) 143/84  Pulse: 60  Resp: 20  Temp: 98.2 F (36.8 C)  SpO2: 94%  Weight: 130 lb (59 kg)  Height: 5' (1.524 m)   Body mass index is 25.39 kg/m.  Physical Exam HENT:     Nose:     Comments: Wearing a mask Eyes:     Pupils: Pupils are equal, round, and reactive to light.  Neck:     Comments: She either has bilateral carotid bruits or referred murmur to her neck Cardiovascular:  Rate and Rhythm: Normal rate.     Pulses: Normal pulses.     Heart sounds: Murmur heard.    Pulmonary:     Effort: Pulmonary effort is normal.     Breath sounds: Normal breath sounds.  Abdominal:     General: Abdomen is flat.     Palpations: Abdomen is soft. There is no mass.  Musculoskeletal:        General: Normal range of motion.     Right lower leg: No edema.     Left lower leg: No edema.  Skin:    General: Skin is warm and dry.     Capillary Refill: Capillary refill takes less than 2 seconds.  Neurological:     General: No focal deficit present.     Mental Status: She is alert.  Psychiatric:        Mood and Affect: Mood normal.        Behavior: Behavior normal.        Thought Content: Thought content normal.        Judgment: Judgment normal.     Data: Independently interpreted her abdominal aortic aneurysm study.  This demonstrates a 4.5 cm abdominal aortic aneurysm.  I have also independently interpreted her bilateral carotid artery duplex this demonstrates 1 to 39% percent stenosis on the right and on the left side 60 to 79% stenosis.     Assessment/Plan:     80 year old female with history of bilateral carotid endarterectomies with recurrent stenosis on the left CT scan could not corroborate a high-grade stenosis today demonstrates 60 to 79%.  She has a stable 4.5 cm abdominal aortic aneurysm and a known thoracic  aneurysm status post IMH.  We will get her a CT scan she would rather wait for 1 year we can get the CT and the carotid duplex at the same time.  We discussed the signs and symptoms of stroke as well as aneurysm rupture she demonstrates good understanding we will see her in 1 year should she not have issues prior.     Maeola Harman MD [facility]

## 2020-08-22 ENCOUNTER — Other Ambulatory Visit: Payer: Self-pay | Admitting: Family Medicine

## 2020-08-22 DIAGNOSIS — F419 Anxiety disorder, unspecified: Secondary | ICD-10-CM

## 2020-09-15 ENCOUNTER — Other Ambulatory Visit: Payer: Self-pay | Admitting: Family Medicine

## 2020-09-15 DIAGNOSIS — F419 Anxiety disorder, unspecified: Secondary | ICD-10-CM

## 2020-09-20 ENCOUNTER — Other Ambulatory Visit: Payer: Self-pay

## 2020-09-20 ENCOUNTER — Encounter: Payer: Self-pay | Admitting: Student in an Organized Health Care Education/Training Program

## 2020-09-20 ENCOUNTER — Ambulatory Visit
Payer: Medicare Other | Attending: Student in an Organized Health Care Education/Training Program | Admitting: Student in an Organized Health Care Education/Training Program

## 2020-09-20 VITALS — BP 155/64 | HR 65 | Temp 97.3°F | Resp 14 | Ht 60.0 in | Wt 131.0 lb

## 2020-09-20 DIAGNOSIS — M1652 Unilateral post-traumatic osteoarthritis, left hip: Secondary | ICD-10-CM

## 2020-09-20 DIAGNOSIS — M5442 Lumbago with sciatica, left side: Secondary | ICD-10-CM | POA: Diagnosis not present

## 2020-09-20 DIAGNOSIS — Z9889 Other specified postprocedural states: Secondary | ICD-10-CM | POA: Diagnosis not present

## 2020-09-20 DIAGNOSIS — G8921 Chronic pain due to trauma: Secondary | ICD-10-CM

## 2020-09-20 DIAGNOSIS — I739 Peripheral vascular disease, unspecified: Secondary | ICD-10-CM

## 2020-09-20 DIAGNOSIS — I25118 Atherosclerotic heart disease of native coronary artery with other forms of angina pectoris: Secondary | ICD-10-CM

## 2020-09-20 DIAGNOSIS — M25552 Pain in left hip: Secondary | ICD-10-CM | POA: Diagnosis not present

## 2020-09-20 DIAGNOSIS — Z79891 Long term (current) use of opiate analgesic: Secondary | ICD-10-CM

## 2020-09-20 DIAGNOSIS — G8929 Other chronic pain: Secondary | ICD-10-CM

## 2020-09-20 MED ORDER — HYDROCODONE-ACETAMINOPHEN 10-325 MG PO TABS: 1.0000 | ORAL_TABLET | ORAL | 0 refills | Status: DC | PRN

## 2020-09-20 NOTE — Progress Notes (Signed)
Nursing Pain Medication Assessment:  Safety precautions to be maintained throughout the outpatient stay will include: orient to surroundings, keep bed in low position, maintain call bell within reach at all times, provide assistance with transfer out of bed and ambulation.  Medication Inspection Compliance: Pill count conducted under aseptic conditions, in front of the patient. Neither the pills nor the bottle was removed from the patient's sight at any time. Once count was completed pills were immediately returned to the patient in their original bottle.  Medication: Hydrocodone/APAP Pill/Patch Count: 18 of 180 pills remain Pill/Patch Appearance: Markings consistent with prescribed medication Bottle Appearance: Standard pharmacy container. Clearly labeled. Filled Date: 08/22/2020 Last Medication intake:  Today
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
## 2020-10-04 ENCOUNTER — Telehealth: Payer: Self-pay

## 2020-10-04 NOTE — Telephone Encounter (Signed)
Patient called back in and has remained on chlorthalidone and last note on 05/06/20 where it was discontinued. She called in today for refill of potassium and was advised to call her primary care provider. Reviewed that at previous call 05/06/20 she was changed to carvedilol and did not tolerate it. So she was placed back on Metoprolol 100 mg three times a day and has remained on chlorthalidone. Advised I would send to provider for review and recommendations. She verbalized understanding with no further questions at this time.

## 2020-10-04 NOTE — Telephone Encounter (Signed)
Left voicemail message requesting to have patient call back RE: chlorthalidone refill request. Last office note dictated by Dr.Agbor-Etang on 06/06/2020 states "chlorthalidone previously stopped due to renal dysfunction." (Pharmacy states refill request was initiated by patient-not on automatic refill.)

## 2020-10-04 NOTE — Telephone Encounter (Signed)
Left voicemail message to please call back. 

## 2020-10-04 NOTE — Telephone Encounter (Signed)
Patient seems to be confused on whether or not she is to continue taking this medication. She states she never stopped taking it and would like to know if she is to continue. Please advise. Thank you!

## 2020-10-06 NOTE — Telephone Encounter (Signed)
Spoke with patient and reviewed that provider wanted her to discontinue the chlorthalidone and she verbalized understanding with no further questions at this time. Updated medication list as well.

## 2020-10-06 NOTE — Addendum Note (Signed)
Addended by: Bryna Colander on: 10/06/2020 08:09 AM   Modules accepted: Orders

## 2020-10-24 ENCOUNTER — Telehealth (INDEPENDENT_AMBULATORY_CARE_PROVIDER_SITE_OTHER): Payer: Medicare Other | Admitting: Family Medicine

## 2020-10-24 ENCOUNTER — Other Ambulatory Visit: Payer: Self-pay

## 2020-10-24 ENCOUNTER — Encounter: Payer: Self-pay | Admitting: Family Medicine

## 2020-10-24 VITALS — BP 130/68

## 2020-10-24 DIAGNOSIS — Z87442 Personal history of urinary calculi: Secondary | ICD-10-CM | POA: Insufficient documentation

## 2020-10-24 DIAGNOSIS — Z760 Encounter for issue of repeat prescription: Secondary | ICD-10-CM

## 2020-10-24 DIAGNOSIS — I10 Essential (primary) hypertension: Secondary | ICD-10-CM

## 2020-10-24 DIAGNOSIS — E875 Hyperkalemia: Secondary | ICD-10-CM

## 2020-10-24 DIAGNOSIS — N183 Chronic kidney disease, stage 3 unspecified: Secondary | ICD-10-CM | POA: Diagnosis not present

## 2020-10-24 DIAGNOSIS — I25118 Atherosclerotic heart disease of native coronary artery with other forms of angina pectoris: Secondary | ICD-10-CM | POA: Diagnosis not present
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
## 2020-10-25 DIAGNOSIS — Z87442 Personal history of urinary calculi: Secondary | ICD-10-CM | POA: Diagnosis not present

## 2020-10-25 DIAGNOSIS — I10 Essential (primary) hypertension: Secondary | ICD-10-CM | POA: Diagnosis not present

## 2020-10-25 DIAGNOSIS — E875 Hyperkalemia: Secondary | ICD-10-CM | POA: Diagnosis not present

## 2020-10-25 DIAGNOSIS — N183 Chronic kidney disease, stage 3 unspecified: Secondary | ICD-10-CM | POA: Diagnosis not present

## 2020-10-25 DIAGNOSIS — Z760 Encounter for issue of repeat prescription: Secondary | ICD-10-CM | POA: Diagnosis not present

## 2020-10-25 LAB — BASIC METABOLIC PANEL WITH GFR
BUN/Creatinine Ratio: 21 (calc) (ref 6–22)
BUN: 20 mg/dL (ref 7–25)
CO2: 29 mmol/L (ref 20–32)
Calcium: 9.4 mg/dL (ref 8.6–10.4)
Chloride: 105 mmol/L (ref 98–110)
Creat: 0.97 mg/dL — ABNORMAL HIGH (ref 0.60–0.93)
GFR, Est African American: 65 mL/min/{1.73_m2} (ref >=60)
GFR, Est Non African American: 56 mL/min/{1.73_m2} — ABNORMAL LOW (ref >=60)
Glucose, Bld: 91 mg/dL (ref 65–99)
Potassium: 4.7 mmol/L (ref 3.5–5.3)
Sodium: 141 mmol/L (ref 135–146)

## 2020-10-27 ENCOUNTER — Other Ambulatory Visit: Payer: Self-pay

## 2020-10-27 MED ORDER — POTASSIUM CITRATE ER 10 MEQ (1080 MG) PO TBCR: 10.0000 meq | EXTENDED_RELEASE_TABLET | Freq: Two times a day (BID) | ORAL | 1 refills | Status: DC

## 2020-11-30 ENCOUNTER — Other Ambulatory Visit: Payer: Self-pay | Admitting: Family Medicine

## 2020-11-30 DIAGNOSIS — I10 Essential (primary) hypertension: Secondary | ICD-10-CM

## 2020-11-30 DIAGNOSIS — I25118 Atherosclerotic heart disease of native coronary artery with other forms of angina pectoris: Secondary | ICD-10-CM

## 2020-12-06 DIAGNOSIS — E539 Vitamin B deficiency, unspecified: Secondary | ICD-10-CM | POA: Insufficient documentation

## 2020-12-06 DIAGNOSIS — D531 Other megaloblastic anemias, not elsewhere classified: Secondary | ICD-10-CM | POA: Insufficient documentation

## 2020-12-06 DIAGNOSIS — D7589 Other specified diseases of blood and blood-forming organs: Secondary | ICD-10-CM | POA: Insufficient documentation

## 2020-12-15 ENCOUNTER — Encounter: Payer: Self-pay | Admitting: Student in an Organized Health Care Education/Training Program

## 2020-12-15 ENCOUNTER — Ambulatory Visit
Payer: Medicare Other | Attending: Student in an Organized Health Care Education/Training Program | Admitting: Student in an Organized Health Care Education/Training Program

## 2020-12-15 ENCOUNTER — Other Ambulatory Visit: Payer: Self-pay

## 2020-12-15 VITALS — BP 180/80 | HR 66 | Temp 98.1°F | Resp 14 | Ht 61.0 in | Wt 135.0 lb

## 2020-12-15 DIAGNOSIS — Z79891 Long term (current) use of opiate analgesic: Secondary | ICD-10-CM | POA: Insufficient documentation

## 2020-12-15 DIAGNOSIS — M25552 Pain in left hip: Secondary | ICD-10-CM | POA: Insufficient documentation

## 2020-12-15 DIAGNOSIS — M1652 Unilateral post-traumatic osteoarthritis, left hip: Secondary | ICD-10-CM

## 2020-12-15 DIAGNOSIS — I739 Peripheral vascular disease, unspecified: Secondary | ICD-10-CM | POA: Insufficient documentation

## 2020-12-15 DIAGNOSIS — Z9889 Other specified postprocedural states: Secondary | ICD-10-CM | POA: Insufficient documentation

## 2020-12-15 DIAGNOSIS — I25118 Atherosclerotic heart disease of native coronary artery with other forms of angina pectoris: Secondary | ICD-10-CM | POA: Insufficient documentation

## 2020-12-15 DIAGNOSIS — G8929 Other chronic pain: Secondary | ICD-10-CM | POA: Insufficient documentation

## 2020-12-15 DIAGNOSIS — G8921 Chronic pain due to trauma: Secondary | ICD-10-CM | POA: Insufficient documentation

## 2020-12-15 MED ORDER — HYDROCODONE-ACETAMINOPHEN 10-325 MG PO TABS: 1.0000 | ORAL_TABLET | ORAL | 0 refills | Status: DC | PRN

## 2020-12-15 NOTE — Progress Notes (Signed)
Nursing Pain Medication Assessment:  Safety precautions to be maintained throughout the outpatient stay will include: orient to surroundings, keep bed in low position, maintain call bell within reach at all times, provide assistance with transfer out of bed and ambulation.  Medication Inspection Compliance: Pill count conducted under aseptic conditions, in front of the patient. Neither the pills nor the bottle was removed from the patient's sight at any time. Once count was completed pills were immediately returned to the patient in their original bottle.  Medication: Hydrocodone/APAP Pill/Patch Count: 33 of 180 pills remain Pill/Patch Appearance: Markings consistent with prescribed medication Bottle Appearance: Standard pharmacy container. Clearly labeled. Filled Date: 11/20/2020 Last Medication intake:  Today
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
## 2020-12-16 LAB — HEMOGLOBIN A1C: Hemoglobin A1C: 5

## 2021-01-25 ENCOUNTER — Encounter: Payer: Self-pay | Admitting: Family Medicine

## 2021-01-31 ENCOUNTER — Ambulatory Visit: Payer: Medicare Other | Admitting: Family Medicine

## 2021-02-08 ENCOUNTER — Other Ambulatory Visit: Payer: Self-pay

## 2021-02-08 ENCOUNTER — Encounter: Payer: Self-pay | Admitting: Family Medicine

## 2021-02-08 ENCOUNTER — Ambulatory Visit (INDEPENDENT_AMBULATORY_CARE_PROVIDER_SITE_OTHER): Payer: Medicare Other | Admitting: Family Medicine

## 2021-02-08 VITALS — BP 142/86 | HR 76 | Temp 98.8°F | Resp 14 | Ht 61.0 in | Wt 137.3 lb

## 2021-02-08 DIAGNOSIS — G8929 Other chronic pain: Secondary | ICD-10-CM

## 2021-02-08 DIAGNOSIS — D531 Other megaloblastic anemias, not elsewhere classified: Secondary | ICD-10-CM | POA: Diagnosis not present

## 2021-02-08 DIAGNOSIS — M25552 Pain in left hip: Secondary | ICD-10-CM

## 2021-02-08 DIAGNOSIS — N183 Chronic kidney disease, stage 3 unspecified: Secondary | ICD-10-CM

## 2021-02-08 DIAGNOSIS — I10 Essential (primary) hypertension: Secondary | ICD-10-CM | POA: Diagnosis not present

## 2021-02-08 DIAGNOSIS — F419 Anxiety disorder, unspecified: Secondary | ICD-10-CM

## 2021-02-08 MED ORDER — CITALOPRAM HYDROBROMIDE 20 MG PO TABS: 20.0000 mg | ORAL_TABLET | Freq: Every day | ORAL | 3 refills | Status: DC

## 2021-02-08 NOTE — Progress Notes (Signed)
    SUBJECTIVE:   CHIEF COMPLAINT / HPI:   Hypertension, CAD, AAA, aortic dissection: - Follows with Cardiology, Vascular - PAD stent to L common iliac  - Medications: plavix, metoprolol 100mg  TID, norvasc 10mg , imdur 30mg , lisinopril 40mg  - Compliance: good - Checking BP at home: yes, 120-130 SBP. Increases at doctor's. - Denies any SOB, CP, vision changes, LE edema, medication SEs, or symptoms of hypotension  Anxiety - Medications: celexa 10mg , buspar 5-10mg  BID prn - Taking: taking buspar daily. - Counseling: no - Previous hospitalizations: none - Symptoms: none currently, but occasionally noticing increase in anxiety sx - will get palpitations, "get real jerky on inside." - Current stressors: none identified - Coping Mechanisms: sitting down and trying to relax.   Chronic pain - continues to follow with [facility].  B12 - elevated at last visit. Cutting OTC supplement in half.  OBJECTIVE:   BP (!) 142/86   Pulse 76   Temp 98.8 F (37.1 C)   Resp 14   Ht 5\' 1"  (1.549 m)   Wt 137 lb 4.8 oz (62.3 kg)   SpO2 94%   BMI 25.94 kg/m   Gen: well appearing, in NAD Card: RRR Lungs: CTAB Ext: WWP, no edema    ASSESSMENT/PLAN:   Essential hypertension At home measurements at goal, does have some white coat HTN. No changes made today. Continue to follow with Cardiology.  CKD (chronic kidney disease) stage 3, GFR 30-59 ml/min (HCC) Recheck labs today.  Chronic left hip pain Continue to follow with pain management.  Other megaloblastic anemias, not elsewhere classified Recheck cbc and B12 today.  Anxiety Symptoms recently increased though responding well to buspar. Will increase celexa. F/u if symptoms not improved or experiencing side effects. Otherwise ok to f/u in 6 months.     , DO

## 2021-02-08 NOTE — Patient Instructions (Signed)
It was great to see you!  Our plans for today:  - Increase your citalopram dose to 20mg . Take 2 pills of your 10mg  pills until you run out. I sent a new prescription to the pharmacy for when you run out.  - We are checking some labs today, we will release these results to your MyChart.  Take care and seek immediate care sooner if you develop any concerns.   Dr. 

## 2021-02-08 NOTE — Assessment & Plan Note (Signed)
Symptoms recently increased though responding well to buspar. Will increase celexa. F/u if symptoms not improved or experiencing side effects. Otherwise ok to f/u in 6 months.

## 2021-02-08 NOTE — Assessment & Plan Note (Signed)
Recheck labs today. 

## 2021-02-08 NOTE — Assessment & Plan Note (Signed)
Continue to follow with pain management °

## 2021-02-08 NOTE — Assessment & Plan Note (Signed)
Recheck cbc and B12 today.  

## 2021-02-08 NOTE — Assessment & Plan Note (Signed)
At home measurements at goal, does have some white coat HTN. No changes made today. Continue to follow with Cardiology.

## 2021-02-09 LAB — BASIC METABOLIC PANEL
BUN/Creatinine Ratio: 26 (calc) — ABNORMAL HIGH (ref 6–22)
BUN: 27 mg/dL — ABNORMAL HIGH (ref 7–25)
CO2: 27 mmol/L (ref 20–32)
Calcium: 9.9 mg/dL (ref 8.6–10.4)
Chloride: 104 mmol/L (ref 98–110)
Creat: 1.04 mg/dL — ABNORMAL HIGH (ref 0.60–0.93)
Glucose, Bld: 85 mg/dL (ref 65–99)
Potassium: 5.2 mmol/L (ref 3.5–5.3)
Sodium: 139 mmol/L (ref 135–146)

## 2021-02-09 LAB — CBC
HCT: 44.9 % (ref 35.0–45.0)
Hemoglobin: 14.9 g/dL (ref 11.7–15.5)
MCH: 33.9 pg — ABNORMAL HIGH (ref 27.0–33.0)
MCHC: 33.2 g/dL (ref 32.0–36.0)
MCV: 102 fL — ABNORMAL HIGH (ref 80.0–100.0)
MPV: 11.1 fL (ref 7.5–12.5)
Platelets: 202 10*3/uL (ref 140–400)
RBC: 4.4 10*6/uL (ref 3.80–5.10)
RDW: 12.1 % (ref 11.0–15.0)
WBC: 6.1 10*3/uL (ref 3.8–10.8)

## 2021-02-09 LAB — VITAMIN B12: Vitamin B-12: 1083 pg/mL (ref 200–1100)

## 2021-02-17 ENCOUNTER — Ambulatory Visit: Payer: Medicare Other | Admitting: Cardiology

## 2021-02-17 ENCOUNTER — Other Ambulatory Visit: Payer: Self-pay

## 2021-02-17 ENCOUNTER — Encounter: Payer: Self-pay | Admitting: Cardiology

## 2021-02-17 VITALS — BP 172/84 | HR 58 | Ht 60.0 in | Wt 138.0 lb

## 2021-02-17 DIAGNOSIS — R0609 Other forms of dyspnea: Secondary | ICD-10-CM

## 2021-02-17 DIAGNOSIS — I10 Essential (primary) hypertension: Secondary | ICD-10-CM

## 2021-02-17 DIAGNOSIS — I2510 Atherosclerotic heart disease of native coronary artery without angina pectoris: Secondary | ICD-10-CM | POA: Diagnosis not present

## 2021-02-17 DIAGNOSIS — R0600 Dyspnea, unspecified: Secondary | ICD-10-CM

## 2021-02-17 DIAGNOSIS — E785 Hyperlipidemia, unspecified: Secondary | ICD-10-CM

## 2021-02-17 MED ORDER — METOPROLOL TARTRATE 100 MG PO TABS: 100.0000 mg | ORAL_TABLET | Freq: Three times a day (TID) | ORAL | 0 refills | Status: DC

## 2021-02-17 NOTE — Patient Instructions (Signed)
Medication Instructions:  Your physician recommends that you continue on your current medications as directed. Please refer to the Current Medication list given to you today.  *If you need a refill on your cardiac medications before your next appointment, please call your pharmacy*   Lab Work: None ordered If you have labs (blood work) drawn today and your tests are completely normal, you will receive your results only by: MyChart Message (if you have MyChart) OR A paper copy in the mail If you have any lab test that is abnormal or we need to change your treatment, we will call you to review the results.   Testing/Procedures:  Your physician has requested that you have an echocardiogram in 6 weeks. Echocardiography is a painless test that uses sound waves to create images of your heart. It provides your doctor with information about the size and shape of your heart and how well your heart's chambers and valves are working. This procedure takes approximately one hour. There are no restrictions for this procedure.    Follow-Up: At [facility], you and your health needs are our priority.  As part of our continuing mission to provide you with exceptional heart care, we have created designated Provider Care Teams.  These Care Teams include your primary Cardiologist (physician) and Advanced Practice Providers (APPs -  Physician Assistants and Nurse Practitioners) who all work together to provide you with the care you need, when you need it.  We recommend signing up for the patient portal called "MyChart".  Sign up information is provided on this After Visit Summary.  MyChart is used to connect with patients for Virtual Visits (Telemedicine).  Patients are able to view lab/test results, encounter notes, upcoming appointments, etc.  Non-urgent messages can be sent to your provider as well.   To learn more about what you can do with MyChart, go to [email or web address].    Your next appointment:    3 month(s)  The format for your next appointment:   In Person  Provider:   Debbe Odea, MD   Other Instructions

## 2021-02-17 NOTE — Progress Notes (Signed)
Cardiology Office Note:    Date:  02/17/2021   ID:  Elizabeth BasqueVivian M Lori, DOB Mar 16, 1941, MRN [id number]  PCP:  Danelle Berryapia, Leisa, PA-C  Cardiologist:  Debbe OdeaBrian Agbor-Etang, MD  Electrophysiologist:  None   Referring MD: Danelle Berryapia, Leisa, PA-C   Chief Complaint  Patient presents with   Other    6 month follow up. Patient c.o SOB. Meds reviewed verbally with patient    History of Present Illness:    Elizabeth [name] is a 80 y.o. 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    Patient being seen due to CAD and hypertension.  States having shortness of breath with exertion.  Attributes this to being out of shape and also weight gain.  States gaining about 16 pounds over the past year or so.  Also has some left hip issues for which she follows up already pain clinic.  Denies chest pain, checks blood pressure at home frequently, systolic is usually 120s to 130s.   Prior notes  In October 2019, she underwent a left heart cath showing multivessel disease but was not seen as a candidate for revascularization.  Medical therapy was recommended.  The plan is if she develops symptoms in the future, high risk PCI may be considered.  echocardiogram on 05/2018 showed normal ejection fraction with EF 60 to 65%  She is being followed by vascular surgery for management of AAA, carotid and peripheral vascular disease.   Patient history of intolerance to several BP meds: hydralazine due to dizziness, also intolerant to Coreg.  She takes Lopressor 100 mg in the morning and 50/100 mg in the evening depending on blood pressure.  She has done this for years and is comfortable with this approach   Past Medical History:  Diagnosis Date   3-vessel CAD    05/2018 LHC with a single remaining conduit  which is the left main coronary artery supplying the LAD.  Both the circumflex and right coronary artery were occluded.  The distal left main, proximal LAD is aneurysmal and heavily calcified.  The LAD supplies well-formed collaterals to the PDA.  Not felt to be candidate for revascularization and with recommendation for medical mgmt   AAA (abdominal aortic aneurysm) (HCC)    4.3cm (12/2018)   Celiac artery stenosis (HCC)    Degenerative joint disease of left hip    Heart failure with preserved ejection fraction (HCC)    EF 60-65%, 05/2018   History of non-ST elevation myocardial infarction (NSTEMI)    05/2018 with subsequent LHC and in the setting of acute aortic ulcer and hematoma   Hyperlipidemia    Hypertension    Internal carotid artery stenosis, left    80-99% 05/2018   Intramural hematoma of thoracic aorta (HCC)    05/2018   Mitral regurgitation    Mild by 05/2018 echo   Paroxysmal SVT (supraventricular tachycardia) (HCC)    05/2018   Peripheral vascular disease (HCC)    extensive vascular dz s/p b/l carotid endarterectomies (2014) and iliac stenting, AAA, aortic ulcer   Right renal artery stenosis (HCC)    05/2018   Smoking hx    Quit ~1992   Status post bilateral carotid endarterectomy    Followed by Vascular surgery with most recent images 05/2018 showing L ICA 80-99%  stenosis   Subclavian artery stenosis, left (HCC)    05/2018    Past Surgical History:  Procedure Laterality Date   APPENDECTOMY     CARDIAC CATHETERIZATION     HIP SURGERY Left    x3   LEFT HEART CATH AND CORONARY ANGIOGRAPHY N/A 05/19/2018   Procedure: LEFT HEART CATH AND CORONARY ANGIOGRAPHY;  Surgeon: Lyn Records, MD;  Location: [facility] INVASIVE CV LAB;  Service: Cardiovascular;  Laterality: N/A;   PERIPHERAL VASCULAR CATHETERIZATION Right 02/02/2016   Procedure: Lower Extremity Angiography;  Surgeon: Annice Needy, MD;  Location: [facility] INVASIVE CV LAB;  Service: Cardiovascular;  Laterality: Right;     Current Medications: Current Meds  Medication Sig   acetaminophen (TYLENOL) 500 MG tablet Take 1,000 mg by mouth 2 (two) times daily as needed for moderate pain.   amLODipine (NORVASC) 10 MG tablet TAKE 1 TABLET BY MOUTH EVERY DAY   aspirin EC 81 MG tablet Take 1 tablet (81 mg total) by mouth daily. Swallow whole.   busPIRone (BUSPAR) 5 MG tablet TAKE 1-2 TABLETS (5-10 MG TOTAL) BY MOUTH 2 (TWO) TIMES DAILY AS NEEDED (ANXIETY/NERVES/PANIC).   cholecalciferol (VITAMIN D) 1000 units tablet Take 1,000 Units by mouth daily.   citalopram (CELEXA) 20 MG tablet Take 1 tablet (20 mg total) by mouth at bedtime.   clopidogrel (PLAVIX) 75 MG tablet TAKE 1 TABLET BY MOUTH EVERY DAY   Cyanocobalamin (VITAMIN B-12 PO) Take 1 tablet by mouth daily.   glucosamine-chondroitin 500-400 MG tablet Take 1 tablet by mouth daily.   HYDROcodone-acetaminophen (NORCO) 10-325 MG tablet Take 1 tablet by mouth every 4 (four) hours as needed for severe pain. Must last 30 days.   HYDROcodone-acetaminophen (NORCO) 10-325 MG tablet Take 1 tablet by mouth every 4 (four) hours as needed for severe pain. Must last 30 days.   [START ON 02/18/2021] HYDROcodone-acetaminophen (NORCO) 10-325 MG tablet Take 1 tablet by mouth every 4 (four) hours as needed for severe pain. Must last 30 days.   isosorbide mononitrate (IMDUR) 30 MG 24 hr tablet TAKE 1/2 OF A TABLET (15 MG TOTAL) BY MOUTH DAILY   lisinopril (ZESTRIL) 40 MG tablet TAKE 1 TABLET BY MOUTH EVERY DAY   Multiple Vitamin (MULTIVITAMIN) tablet Take 1 tablet by mouth daily.   nitroGLYCERIN (NITROSTAT) 0.4 MG SL tablet Place 1 tablet (0.4 mg total) under the tongue every 5 (five) minutes as needed for chest pain.   potassium citrate (UROCIT-K) 10 MEQ (1080 MG) SR tablet Take 1 tablet (10 mEq total) by mouth 2 (two) times daily.   rosuvastatin (CRESTOR) 40 MG tablet Take 1 tablet (40 mg total) by mouth daily.   [DISCONTINUED] carvedilol (COREG) 12.5 MG tablet Take 12.5 mg by mouth  2 (two) times daily.   [DISCONTINUED] metoprolol tartrate (LOPRESSOR) 100 MG tablet Take 1 tablet (100 mg total) by mouth 3 (three) times daily.     Allergies:   Carvedilol, Codeine, Hydralazine, and Sulfa antibiotics   Social History   Socioeconomic History   Marital status: Widowed    Spouse name: Not on file   Number of children: 2   Years of education: Not on file   Highest education level: GED or equivalent  Occupational History   Occupation: diabled  Tobacco Use   Smoking status: Former    Types: Cigarettes    Quit date: 02/02/1999    Years since quitting: 22.0   Smokeless tobacco: Never  Vaping Use   Vaping Use: Never used  Substance and  Sexual Activity   Alcohol use: No   Drug use: No   Sexual activity: Not Currently  Other Topics Concern   Not on file  Social History Narrative   Pt lives alone, does not drive but sister drives her when needed.    Social Determinants of Health   Financial Resource Strain: Low Risk    Difficulty of Paying Living Expenses: Not hard at all  Food Insecurity: No Food Insecurity   Worried About Programme researcher, broadcasting/film/video in the Last Year: Never true   Ran Out of Food in the Last Year: Never true  Transportation Needs: No Transportation Needs   Lack of Transportation (Medical): No   Lack of Transportation (Non-Medical): No  Physical Activity: Inactive   Days of Exercise per Week: 0 days   Minutes of Exercise per Session: 0 min  Stress: No Stress Concern Present   Feeling of Stress : Not at all  Social Connections: Moderately Isolated   Frequency of Communication with Friends and Family: More than three times a week   Frequency of Social Gatherings with Friends and Family: More than three times a week   Attends Religious Services: More than 4 times per year   Active Member of [employer] or Organizations: No   Attends Banker Meetings: Never   Marital Status: Widowed     Family History: The patient's family history includes  Heart disease in her son; Hodgkin's lymphoma in her son; Hyperlipidemia in her sister; Hypertension in her father, mother, and sister; Kidney disease in her son; Stroke in her father and mother. There is no history of Mental illness.  ROS:   Please see the history of present illness.     All other systems reviewed and are negative.  EKGs/Labs/Other Studies Reviewed:    The following studies were reviewed today:   EKG:  EKG is  ordered today.  The ekg ordered today demonstrates sinus bradycardia, otherwise normal ECG.  Recent Labs: 08/04/2020: ALT 18; TSH 1.43 02/08/2021: BUN 27; Creat 1.04; Hemoglobin 14.9; Platelets 202; Potassium 5.2; Sodium 139  Recent Lipid Panel    Component Value Date/Time   CHOL 160 08/04/2020 1155   TRIG 238 (H) 08/04/2020 1155   HDL 44 (L) 08/04/2020 1155   CHOLHDL 3.6 08/04/2020 1155   VLDL 28 05/20/2018 0249   LDLCALC 83 08/04/2020 1155    Physical Exam:    VS:  BP (!) 172/84 (BP Location: Left Arm, Patient Position: Sitting, Cuff Size: Normal)   Pulse (!) 58   Ht 5' (1.524 m)   Wt 138 lb (62.6 kg)   SpO2 95%   BMI 26.95 kg/m     Wt Readings from Last 3 Encounters:  02/17/21 138 lb (62.6 kg)  02/08/21 137 lb 4.8 oz (62.3 kg)  12/15/20 135 lb (61.2 kg)     GEN:  Well nourished, well developed in no acute distress HEENT: Normal NECK: No JVD; bilateral carotid bruits noted LYMPHATICS: No lymphadenopathy CARDIAC: RRR, 2/6 systolic murmur at right upper sternal border RESPIRATORY:  Clear to auscultation without rales, wheezing or rhonchi  ABDOMEN: Soft, non-tender, non-distended MUSCULOSKELETAL:  No edema; No deformity  SKIN: Warm and dry NEUROLOGIC:  Alert and oriented x 3 PSYCHIATRIC:  Normal affect   ASSESSMENT:   . 1. Dyspnea on exertion   2. Primary hypertension   3. White coat syndrome with diagnosis of hypertension   4. Coronary artery disease involving native coronary artery of native heart without angina pectoris  5.  Hyperlipidemia LDL goal <70     PLAN:    In order of problems listed above:  Dyspnea on exertion, history of multivessel CAD.  Previous echo showed preserved ejection fraction in 2019.  Repeat echocardiogram to evaluate any worsening EF.  Symptoms of shortness of breath could be due to deconditioning. hypertension.  BP elevated today, usually normal at home.  She has a component of whitecoat syndrome.  Continue Lopressor, Imdur, Norvasc, lisinopril.   Multivessel CAD, (CTO RCA, CTO Lcx, 60% ostial LM, 80% prox LAD, 70%mid )not candidate for PCI .  Continue  aspirin Plavix, Crestor imdur.  Echo 2019 showed normal EF, 60 to 65%, impaired relaxation.  Repeat echo as above. History of hyperlipidemia, Crestor 40 mg daily  Follow-up after repeat echocardiogram.  Total encounter time 35 minutes  Greater than 50% was spent in counseling and coordination of care with the patient   This note was generated in part or whole with voice recognition software. Voice recognition is usually quite accurate but there are transcription errors that can and very often do occur. I apologize for any typographical errors that were not detected and corrected.  Medication Adjustments/Labs and Tests Ordered: Current medicines are reviewed at length with the patient today.  Concerns regarding medicines are outlined above.  Orders Placed This Encounter  Procedures   EKG 12-Lead   ECHOCARDIOGRAM COMPLETE    Meds ordered this encounter  Medications   metoprolol tartrate (LOPRESSOR) 100 MG tablet    Sig: Take 1 tablet (100 mg total) by mouth 3 (three) times daily.    Dispense:  270 tablet    Refill:  0     Patient Instructions  Medication Instructions:  Your physician recommends that you continue on your current medications as directed. Please refer to the Current Medication list given to you today.  *If you need a refill on your cardiac medications before your next appointment, please call your  pharmacy*   Lab Work: None ordered If you have labs (blood work) drawn today and your tests are completely normal, you will receive your results only by: MyChart Message (if you have MyChart) OR A paper copy in the mail If you have any lab test that is abnormal or we need to change your treatment, we will call you to review the results.   Testing/Procedures:  Your physician has requested that you have an echocardiogram in 6 weeks. Echocardiography is a painless test that uses sound waves to create images of your heart. It provides your doctor with information about the size and shape of your heart and how well your heart's chambers and valves are working. This procedure takes approximately one hour. There are no restrictions for this procedure.    Follow-Up: At [facility], you and your health needs are our priority.  As part of our continuing mission to provide you with exceptional heart care, we have created designated Provider Care Teams.  These Care Teams include your primary Cardiologist (physician) and Advanced Practice Providers (APPs -  Physician Assistants and Nurse Practitioners) who all work together to provide you with the care you need, when you need it.  We recommend signing up for the patient portal called "MyChart".  Sign up information is provided on this After Visit Summary.  MyChart is used to connect with patients for Virtual Visits (Telemedicine).  Patients are able to view lab/test results, encounter notes, upcoming appointments, etc.  Non-urgent messages can be sent to your provider as well.  To learn more about what you can do with MyChart, go to [email or web address].    Your next appointment:   3 month(s)  The format for your next appointment:   In Person  Provider:   Debbe Odea, MD   Other Instructions    Signed, Debbe Odea, MD  02/17/2021 12:22 PM    [facility]

## 2021-02-20 ENCOUNTER — Other Ambulatory Visit: Payer: Self-pay

## 2021-02-20 DIAGNOSIS — I25118 Atherosclerotic heart disease of native coronary artery with other forms of angina pectoris: Secondary | ICD-10-CM

## 2021-02-20 DIAGNOSIS — E785 Hyperlipidemia, unspecified: Secondary | ICD-10-CM

## 2021-02-20 MED ORDER — ROSUVASTATIN CALCIUM 40 MG PO TABS: 40.0000 mg | ORAL_TABLET | Freq: Every day | ORAL | 3 refills | Status: DC

## 2021-03-08 ENCOUNTER — Other Ambulatory Visit: Payer: Self-pay | Admitting: Family Medicine

## 2021-03-08 DIAGNOSIS — F419 Anxiety disorder, unspecified: Secondary | ICD-10-CM

## 2021-03-16 ENCOUNTER — Other Ambulatory Visit: Payer: Self-pay

## 2021-03-16 ENCOUNTER — Ambulatory Visit
Payer: Medicare Other | Attending: Student in an Organized Health Care Education/Training Program | Admitting: Student in an Organized Health Care Education/Training Program

## 2021-03-16 ENCOUNTER — Encounter: Payer: Self-pay | Admitting: Student in an Organized Health Care Education/Training Program

## 2021-03-16 VITALS — BP 153/72 | HR 60 | Temp 96.6°F | Resp 16 | Ht 60.0 in | Wt 138.0 lb

## 2021-03-16 DIAGNOSIS — I739 Peripheral vascular disease, unspecified: Secondary | ICD-10-CM

## 2021-03-16 DIAGNOSIS — M1652 Unilateral post-traumatic osteoarthritis, left hip: Secondary | ICD-10-CM | POA: Diagnosis not present

## 2021-03-16 DIAGNOSIS — Z79891 Long term (current) use of opiate analgesic: Secondary | ICD-10-CM | POA: Diagnosis not present

## 2021-03-16 DIAGNOSIS — Z0289 Encounter for other administrative examinations: Secondary | ICD-10-CM | POA: Diagnosis not present

## 2021-03-16 DIAGNOSIS — M25552 Pain in left hip: Secondary | ICD-10-CM | POA: Diagnosis not present

## 2021-03-16 DIAGNOSIS — I25118 Atherosclerotic heart disease of native coronary artery with other forms of angina pectoris: Secondary | ICD-10-CM | POA: Diagnosis not present

## 2021-03-16 DIAGNOSIS — G8929 Other chronic pain: Secondary | ICD-10-CM | POA: Insufficient documentation

## 2021-03-16 DIAGNOSIS — Z9889 Other specified postprocedural states: Secondary | ICD-10-CM | POA: Diagnosis not present

## 2021-03-16 DIAGNOSIS — Z87828 Personal history of other (healed) physical injury and trauma: Secondary | ICD-10-CM | POA: Insufficient documentation

## 2021-03-16 DIAGNOSIS — M5442 Lumbago with sciatica, left side: Secondary | ICD-10-CM

## 2021-03-16 DIAGNOSIS — G8921 Chronic pain due to trauma: Secondary | ICD-10-CM | POA: Diagnosis not present

## 2021-03-16 MED ORDER — HYDROCODONE-ACETAMINOPHEN 10-325 MG PO TABS: 1.0000 | ORAL_TABLET | ORAL | 0 refills | Status: DC | PRN
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
## 2021-03-16 NOTE — Progress Notes (Signed)
Nursing Pain Medication Assessment:  Safety precautions to be maintained throughout the outpatient stay will include: orient to surroundings, keep bed in low position, maintain call bell within reach at all times, provide assistance with transfer out of bed and ambulation.  Medication Inspection Compliance: Pill count conducted under aseptic conditions, in front of the patient. Neither the pills nor the bottle was removed from the patient's sight at any time. Once count was completed pills were immediately returned to the patient in their original bottle.  Medication: Hydrocodone/APAP Pill/Patch Count:  42 of 180 pills remain Pill/Patch Appearance: Markings consistent with prescribed medication Bottle Appearance: Standard pharmacy container. Clearly labeled. Filled Date: 07 / 17 / 2022 Last Medication intake:  Today

## 2021-03-21 LAB — TOXASSURE SELECT 13 (MW), URINE

## 2021-03-30 ENCOUNTER — Other Ambulatory Visit: Payer: Self-pay | Admitting: Family Medicine

## 2021-04-06 ENCOUNTER — Other Ambulatory Visit: Payer: Medicare Other

## 2021-04-27 ENCOUNTER — Other Ambulatory Visit: Payer: Self-pay

## 2021-04-27 ENCOUNTER — Ambulatory Visit (INDEPENDENT_AMBULATORY_CARE_PROVIDER_SITE_OTHER): Payer: Medicare Other

## 2021-04-27 VITALS — BP 148/88 | HR 65 | Temp 98.5°F | Resp 16 | Ht 60.0 in | Wt 141.5 lb

## 2021-04-27 DIAGNOSIS — Z0000 Encounter for general adult medical examination without abnormal findings: Secondary | ICD-10-CM | POA: Diagnosis not present

## 2021-04-27 DIAGNOSIS — Z23 Encounter for immunization: Secondary | ICD-10-CM | POA: Diagnosis not present

## 2021-04-27 NOTE — Progress Notes (Signed)
Subjective:   Elizabeth [name] is a 80 y.o. female who presents for Medicare Annual (Subsequent) preventive examination.  Review of Systems     Cardiac Risk Factors include: advanced age (>41men, >83 women);dyslipidemia;hypertension     Objective:    Today's Vitals   04/27/21 1348 04/27/21 1350  BP: (!) 148/88   Pulse: 65   Resp: 16   Temp: 98.5 F (36.9 C)   TempSrc: Oral   SpO2: 95%   Weight: 141 lb 8 oz (64.2 kg)   Height: 5' (1.524 m)   PainSc:  3    Body mass index is 27.63 kg/m.  Advanced Directives 04/27/2021 03/16/2021 12/15/2020 09/20/2020 06/21/2020 04/26/2020 03/22/2020  Does Patient Have a Medical Advance Directive? No No No No No No No  Type of Advance Directive - - - - - - -  Does patient want to make changes to medical advance directive? No - Patient declined - - - - - -  Would patient like information on creating a medical advance directive? - No - Patient declined - - No - Patient declined Yes (MAU/Ambulatory/Procedural Areas - Information given) No - Patient declined    Current Medications (verified) Outpatient Encounter Medications as of 04/27/2021  Medication Sig   amLODipine (NORVASC) 10 MG tablet TAKE 1 TABLET BY MOUTH EVERY DAY   aspirin EC 81 MG tablet Take 1 tablet (81 mg total) by mouth daily. Swallow whole.   busPIRone (BUSPAR) 5 MG tablet TAKE 1-2 TABLETS (5-10 MG TOTAL) BY MOUTH 2 (TWO) TIMES DAILY AS NEEDED (ANXIETY/NERVES/PANIC).   cholecalciferol (VITAMIN D) 1000 units tablet Take 1,000 Units by mouth daily.   citalopram (CELEXA) 20 MG tablet Take 1 tablet (20 mg total) by mouth at bedtime.   clopidogrel (PLAVIX) 75 MG tablet TAKE 1 TABLET BY MOUTH EVERY DAY   Cyanocobalamin (VITAMIN B-12 PO) Take 1 tablet by mouth daily.   glucosamine-chondroitin 500-400 MG tablet Take 1 tablet by mouth daily.   HYDROcodone-acetaminophen (NORCO) 10-325 MG tablet Take 1 tablet by mouth every 4 (four) hours as needed for severe pain. Must last 30 days.    isosorbide mononitrate (IMDUR) 30 MG 24 hr tablet TAKE 1/2 OF A TABLET (15 MG TOTAL) BY MOUTH DAILY   lisinopril (ZESTRIL) 40 MG tablet TAKE 1 TABLET BY MOUTH EVERY DAY   metoprolol tartrate (LOPRESSOR) 100 MG tablet Take 1 tablet (100 mg total) by mouth 3 (three) times daily.   Multiple Vitamin (MULTIVITAMIN) tablet Take 1 tablet by mouth daily.   potassium citrate (UROCIT-K) 10 MEQ (1080 MG) SR tablet TAKE 1 TABLET BY MOUTH 2 TIMES DAILY.   rosuvastatin (CRESTOR) 40 MG tablet Take 1 tablet (40 mg total) by mouth daily.   [START ON 05/20/2021] HYDROcodone-acetaminophen (NORCO) 10-325 MG tablet Take 1 tablet by mouth every 4 (four) hours as needed for severe pain. Must last 30 days.   nitroGLYCERIN (NITROSTAT) 0.4 MG SL tablet Place 1 tablet (0.4 mg total) under the tongue every 5 (five) minutes as needed for chest pain.   [DISCONTINUED] acetaminophen (TYLENOL) 500 MG tablet Take 1,000 mg by mouth 2 (two) times daily as needed for moderate pain.   [DISCONTINUED] HYDROcodone-acetaminophen (NORCO) 10-325 MG tablet Take 1 tablet by mouth every 4 (four) hours as needed for severe pain. Must last 30 days.   No facility-administered encounter medications on file as of 04/27/2021.    Allergies (verified) Carvedilol, Codeine, Hydralazine, and Sulfa antibiotics   History: Past Medical History:  Diagnosis Date  3-vessel CAD    05/2018 LHC with a single remaining conduit which is the left main coronary artery supplying the LAD.  Both the circumflex and right coronary artery were occluded.  The distal left main, proximal LAD is aneurysmal and heavily calcified.  The LAD supplies well-formed collaterals to the PDA.  Not felt to be candidate for revascularization and with recommendation for medical mgmt   AAA (abdominal aortic aneurysm) (HCC)    4.3cm (12/2018)   Celiac artery stenosis (HCC)    Degenerative joint disease of left hip    Heart failure with preserved ejection fraction (HCC)    EF 60-65%,  05/2018   History of non-ST elevation myocardial infarction (NSTEMI)    05/2018 with subsequent LHC and in the setting of acute aortic ulcer and hematoma   Hyperlipidemia    Hypertension    Internal carotid artery stenosis, left    80-99% 05/2018   Intramural hematoma of thoracic aorta (HCC)    05/2018   Mitral regurgitation    Mild by 05/2018 echo   Paroxysmal SVT (supraventricular tachycardia) (HCC)    05/2018   Peripheral vascular disease (HCC)    extensive vascular dz s/p b/l carotid endarterectomies (2014) and iliac stenting, AAA, aortic ulcer   Right renal artery stenosis (HCC)    05/2018   Smoking hx    Quit ~1992   Status post bilateral carotid endarterectomy    Followed by Vascular surgery with most recent images 05/2018 showing L ICA 80-99% stenosis   Subclavian artery stenosis, left (HCC)    05/2018   Past Surgical History:  Procedure Laterality Date   APPENDECTOMY     CARDIAC CATHETERIZATION     HIP SURGERY Left    x3   LEFT HEART CATH AND CORONARY ANGIOGRAPHY N/A 05/19/2018   Procedure: LEFT HEART CATH AND CORONARY ANGIOGRAPHY;  Surgeon: Lyn Records, MD;  Location: [facility];  Service: Cardiovascular;  Laterality: N/A;   PERIPHERAL VASCULAR CATHETERIZATION Right 02/02/2016   Procedure: Lower Extremity Angiography;  Surgeon: Annice Needy, MD;  Location: [facility] INVASIVE CV LAB;  Service: Cardiovascular;  Laterality: Right;   Family History  Problem Relation Age of Onset   Hypertension Mother    Stroke Mother    Hypertension Father    Stroke Father    Hypertension Sister    Hyperlipidemia Sister    Hodgkin's lymphoma Son    Heart disease Son    Kidney disease Son    Mental illness Neg Hx    Social History   Socioeconomic History   Marital status: Widowed    Spouse name: Not on file   Number of children: 2   Years of education: Not on file   Highest education level: GED or equivalent  Occupational History   Occupation: diabled  Tobacco Use    Smoking status: Former    Types: Cigarettes    Quit date: 02/02/1999    Years since quitting: 22.2   Smokeless tobacco: Never  Vaping Use   Vaping Use: Never used  Substance and Sexual Activity   Alcohol use: No   Drug use: No   Sexual activity: Not Currently  Other Topics Concern   Not on file  Social History Narrative   Pt lives alone, does not drive but sister drives her when needed.    Social Determinants of Health   Financial Resource Strain: Low Risk    Difficulty of Paying Living Expenses: Not hard at all  Food Insecurity: No  Food Insecurity   Worried About Programme researcher, broadcasting/film/video in the Last Year: Never true   Ran Out of Food in the Last Year: Never true  Transportation Needs: No Transportation Needs   Lack of Transportation (Medical): No   Lack of Transportation (Non-Medical): No  Physical Activity: Inactive   Days of Exercise per Week: 0 days   Minutes of Exercise per Session: 0 min  Stress: No Stress Concern Present   Feeling of Stress : Only a little  Social Connections: Moderately Isolated   Frequency of Communication with Friends and Family: More than three times a week   Frequency of Social Gatherings with Friends and Family: More than three times a week   Attends Religious Services: More than 4 times per year   Active Member of [employer] or Organizations: No   Attends Banker Meetings: Never   Marital Status: Widowed    Tobacco Counseling Counseling given: Not Answered   Clinical Intake:  Pre-visit preparation completed: Yes  Pain : 0-10 Pain Score: 3  Pain Type: Chronic pain Pain Location: Back (left hip) Pain Orientation: Lower Pain Descriptors / Indicators: Aching, Sore Pain Onset: More than a month ago Pain Frequency: Constant     BMI - recorded: 27.63 Nutritional Status: BMI 25 -29 Overweight Nutritional Risks: None Diabetes: No  How often do you need to have someone help you when you read instructions, pamphlets, or other  written materials from your doctor or pharmacy?: 1 - Never    Interpreter Needed?: No  Information entered by :: [name] LPN   Activities of Daily Living In your present state of health, do you have any difficulty performing the following activities: 04/27/2021 02/08/2021  Hearing? N N  Vision? N N  Difficulty concentrating or making decisions? N N  Walking or climbing stairs? Y N  Dressing or bathing? N N  Doing errands, shopping? N N  Preparing Food and eating ? N -  Using the Toilet? N -  In the past six months, have you accidently leaked urine? N -  Do you have problems with loss of bowel control? N -  Managing your Medications? N -  Managing your Finances? N -  Housekeeping or managing your Housekeeping? N -  Some recent data might be hidden    Patient Care Team: Danelle Berry, PA-C as PCP - General (Family Medicine) Debbe Odea, MD as PCP - Cardiology (Cardiology)  Indicate any recent Medical Services you may have received from other than [facility] providers in the past year (date may be approximate).     Assessment:   This is a routine wellness examination for Jalayia.  Hearing/Vision screen Hearing Screening - Comments:: Pt denies hearing difficulty Vision Screening - Comments:: Due for eye exam established with Dr. Alvester Morin  Dietary issues and exercise activities discussed: Current Exercise Habits: The patient does not participate in regular exercise at present, Exercise limited by: orthopedic condition(s)   Goals Addressed   None    Depression Screen PHQ 2/9 Scores 04/27/2021 03/16/2021 02/08/2021 12/15/2020 10/24/2020 09/20/2020 08/04/2020  PHQ - 2 Score 0 0 0 0 0 0 0  PHQ- 9 Score - - 0 - 0 - 0    Fall Risk Fall Risk  04/27/2021 03/16/2021 02/08/2021 12/15/2020 10/24/2020  Falls in the past year? 0 0 0 0 0  Number falls in past yr: 0 - 0 - 0  Injury with Fall? 0 - 0 - 0  Risk for fall due to : Impaired  balance/gait - - - -  Follow up Falls prevention discussed -  - - Falls evaluation completed    FALL RISK PREVENTION PERTAINING TO THE HOME:  Any stairs in or around the home? No  If so, are there any without handrails? No  Home free of loose throw rugs in walkways, pet beds, electrical cords, etc? Yes  Adequate lighting in your home to reduce risk of falls? Yes   ASSISTIVE DEVICES UTILIZED TO PREVENT FALLS:  Life alert? No  Use of a cane, walker or w/c? Yes  Grab bars in the bathroom? Yes  Shower chair or bench in shower? Yes  Elevated toilet seat or a handicapped toilet? No   TIMED UP AND GO:  Was the test performed? Yes .  Length of time to ambulate 10 feet: 7 sec.   Gait slow and steady with assistive device  Cognitive Function:     6CIT Screen 04/26/2020  What Year? 0 points  What month? 0 points  What time? 0 points  Count back from 20 0 points  Months in reverse 0 points  Repeat phrase 0 points  Total Score 0    Immunizations Immunization History  Administered Date(s) Administered   Fluad Quad(high Dose 65+) 04/28/2020, 04/27/2021   Influenza, High Dose Seasonal PF 05/01/2017, 04/08/2018   Influenza-Unspecified 05/06/2014, 04/22/2019   PFIZER Comirnaty(Gray Top)Covid-19 Tri-Sucrose Vaccine 05/03/2020, 11/07/2020   PFIZER(Purple Top)SARS-COV-2 Vaccination 08/12/2019, 09/02/2019    TDAP status: Due, Education has been provided regarding the importance of this vaccine. Advised may receive this vaccine at local pharmacy or Health Dept. Aware to provide a copy of the vaccination record if obtained from local pharmacy or Health Dept. Verbalized acceptance and understanding.  Flu Vaccine status: Completed at today's visit  Pneumococcal vaccine status: Due, Education has been provided regarding the importance of this vaccine. Advised may receive this vaccine at local pharmacy or Health Dept. Aware to provide a copy of the vaccination record if obtained from local pharmacy or Health Dept. Verbalized acceptance and  understanding.  Covid-19 vaccine status: Completed vaccines  Qualifies for Shingles Vaccine? Yes   Zostavax completed No   Shingrix Completed?: No.    Education has been provided regarding the importance of this vaccine. Patient has been advised to call insurance company to determine out of pocket expense if they have not yet received this vaccine. Advised may also receive vaccine at local pharmacy or Health Dept. Verbalized acceptance and understanding.  Screening Tests Health Maintenance  Topic Date Due   Hepatitis C Screening  Never done   TETANUS/TDAP  Never done   Zoster Vaccines- Shingrix (1 of 2) Never done   DEXA SCAN  Never done   INFLUENZA VACCINE  Completed   COVID-19 Vaccine  Completed   HPV VACCINES  Aged Out    Health Maintenance  Health Maintenance Due  Topic Date Due   Hepatitis C Screening  Never done   TETANUS/TDAP  Never done   Zoster Vaccines- Shingrix (1 of 2) Never done   DEXA SCAN  Never done    Colorectal cancer screening: No longer required.   Mammogram status: No longer required due to age.  Bone density status: no longer required due to age  Lung Cancer Screening: (Low Dose CT Chest recommended if Age 56-80 years, 30 pack-year currently smoking OR have quit w/in 15years.) does not qualify.   Additional Screening:  Hepatitis C Screening: does qualify; postponed  Vision Screening: Recommended annual ophthalmology exams for early detection of  glaucoma and other disorders of the eye. Is the patient up to date with their annual eye exam?  No  Who is the provider or what is the name of the office in which the patient attends annual eye exams? Dr. Alvester Morin.   Dental Screening: Recommended annual dental exams for proper oral hygiene  Community Resource Referral / Chronic Care Management: CRR required this visit?  No   CCM required this visit?  No      Plan:     I have personally reviewed and noted the following in the patient's chart:    Medical and social history Use of alcohol, tobacco or illicit drugs  Current medications and supplements including opioid prescriptions.  Functional ability and status Nutritional status Physical activity Advanced directives List of other physicians Hospitalizations, surgeries, and ER visits in previous 12 months Vitals Screenings to include cognitive, depression, and falls Referrals and appointments  In addition, I have reviewed and discussed with patient certain preventive protocols, quality metrics, and best practice recommendations. A written personalized care plan for preventive services as well as general preventive health recommendations were provided to patient.     [name], LPN   6/81/1572   Nurse Notes: pt's blood pressure slightly elevated today; 156/92 at start of visit and 148/88 at end of visit. Pt states she is due to take metoprolol again at 3:00 (currently 2:00) pt also scheduled for echo next week and follow up with cardiology 05/22/21.

## 2021-04-27 NOTE — Patient Instructions (Signed)
[name] , Thank you for taking time to come for your Medicare Wellness Visit. I appreciate your ongoing commitment to your health goals. Please review the following plan we discussed and let me know if I can assist you in the future.   Screening recommendations/referrals: Colonoscopy: no longer required Mammogram: no longer required Bone Density: no longer required Recommended yearly ophthalmology/optometry visit for glaucoma screening and checkup Recommended yearly dental visit for hygiene and checkup  Vaccinations: Influenza vaccine: done today Pneumococcal vaccine: due Tdap vaccine: due Shingles vaccine: Shingrix discussed. Please contact your pharmacy for coverage information.  Covid-19:done 08/12/19, 09/02/19, 05/03/20 & 11/07/20  Advanced directives: Advance directive discussed with you today. Even though you declined this today please call our office should you change your mind and we can give you the proper paperwork for you to fill out.   Conditions/risks identified: Recommend continuing fall prevention in the home  Next appointment: Follow up in one year for your annual wellness visit    Preventive Care 65 Years and Older, Female Preventive care refers to lifestyle choices and visits with your health care provider that can promote health and wellness. What does preventive care include? A yearly physical exam. This is also called an annual well check. Dental exams once or twice a year. Routine eye exams. Ask your health care provider how often you should have your eyes checked. Personal lifestyle choices, including: Daily care of your teeth and gums. Regular physical activity. Eating a healthy diet. Avoiding tobacco and drug use. Limiting alcohol use. Practicing safe sex. Taking low-dose aspirin every day. Taking vitamin and mineral supplements as recommended by your health care provider. What happens during an annual well check? The services and screenings done by your  health care provider during your annual well check will depend on your age, overall health, lifestyle risk factors, and family history of disease. Counseling  Your health care provider may ask you questions about your: Alcohol use. Tobacco use. Drug use. Emotional well-being. Home and relationship well-being. Sexual activity. Eating habits. History of falls. Memory and ability to understand (cognition). Work and work Astronomer. Reproductive health. Screening  You may have the following tests or measurements: Height, weight, and BMI. Blood pressure. Lipid and cholesterol levels. These may be checked every 5 years, or more frequently if you are over 6 years old. Skin check. Lung cancer screening. You may have this screening every year starting at age 3 if you have a 30-pack-year history of smoking and currently smoke or have quit within the past 15 years. Fecal occult blood test (FOBT) of the stool. You may have this test every year starting at age 24. Flexible sigmoidoscopy or colonoscopy. You may have a sigmoidoscopy every 5 years or a colonoscopy every 10 years starting at age 84. Hepatitis C blood test. Hepatitis B blood test. Sexually transmitted disease (STD) testing. Diabetes screening. This is done by checking your blood sugar (glucose) after you have not eaten for a while (fasting). You may have this done every 1-3 years. Bone density scan. This is done to screen for osteoporosis. You may have this done starting at age 33. Mammogram. This may be done every 1-2 years. Talk to your health care provider about how often you should have regular mammograms. Talk with your health care provider about your test results, treatment options, and if necessary, the need for more tests. Vaccines  Your health care provider may recommend certain vaccines, such as: Influenza vaccine. This is recommended every year. Tetanus, diphtheria,  and acellular pertussis (Tdap, Td) vaccine. You may  need a Td booster every 10 years. Zoster vaccine. You may need this after age 52. Pneumococcal 13-valent conjugate (PCV13) vaccine. One dose is recommended after age 66. Pneumococcal polysaccharide (PPSV23) vaccine. One dose is recommended after age 52. Talk to your health care provider about which screenings and vaccines you need and how often you need them. This information is not intended to replace advice given to you by your health care provider. Make sure you discuss any questions you have with your health care provider. Document Released: 08/19/2015 Document Revised: 04/11/2016 Document Reviewed: 05/24/2015 Elsevier Interactive Patient Education  2017 [place] Prevention in the Home Falls can cause injuries. They can happen to people of all ages. There are many things you can do to make your home safe and to help prevent falls. What can I do on the outside of my home? Regularly fix the edges of walkways and driveways and fix any cracks. Remove anything that might make you trip as you walk through a door, such as a raised step or threshold. Trim any bushes or trees on the path to your home. Use bright outdoor lighting. Clear any walking paths of anything that might make someone trip, such as rocks or tools. Regularly check to see if handrails are loose or broken. Make sure that both sides of any steps have handrails. Any raised decks and porches should have guardrails on the edges. Have any leaves, snow, or ice cleared regularly. Use sand or salt on walking paths during winter. Clean up any spills in your garage right away. This includes oil or grease spills. What can I do in the bathroom? Use night lights. Install grab bars by the toilet and in the tub and shower. Do not use towel bars as grab bars. Use non-skid mats or decals in the tub or shower. If you need to sit down in the shower, use a plastic, non-slip stool. Keep the floor dry. Clean up any water that spills on  the floor as soon as it happens. Remove soap buildup in the tub or shower regularly. Attach bath mats securely with double-sided non-slip rug tape. Do not have throw rugs and other things on the floor that can make you trip. What can I do in the bedroom? Use night lights. Make sure that you have a light by your bed that is easy to reach. Do not use any sheets or blankets that are too big for your bed. They should not hang down onto the floor. Have a firm chair that has side arms. You can use this for support while you get dressed. Do not have throw rugs and other things on the floor that can make you trip. What can I do in the kitchen? Clean up any spills right away. Avoid walking on wet floors. Keep items that you use a lot in easy-to-reach places. If you need to reach something above you, use a strong step stool that has a grab bar. Keep electrical cords out of the way. Do not use floor polish or wax that makes floors slippery. If you must use wax, use non-skid floor wax. Do not have throw rugs and other things on the floor that can make you trip. What can I do with my stairs? Do not leave any items on the stairs. Make sure that there are handrails on both sides of the stairs and use them. Fix handrails that are broken or loose. Make sure  that handrails are as long as the stairways. Check any carpeting to make sure that it is firmly attached to the stairs. Fix any carpet that is loose or worn. Avoid having throw rugs at the top or bottom of the stairs. If you do have throw rugs, attach them to the floor with carpet tape. Make sure that you have a light switch at the top of the stairs and the bottom of the stairs. If you do not have them, ask someone to add them for you. What else can I do to help prevent falls? Wear shoes that: Do not have high heels. Have rubber bottoms. Are comfortable and fit you well. Are closed at the toe. Do not wear sandals. If you use a stepladder: Make sure  that it is fully opened. Do not climb a closed stepladder. Make sure that both sides of the stepladder are locked into place. Ask someone to hold it for you, if possible. Clearly mark and make sure that you can see: Any grab bars or handrails. First and last steps. Where the edge of each step is. Use tools that help you move around (mobility aids) if they are needed. These include: Canes. Walkers. Scooters. Crutches. Turn on the lights when you go into a dark area. Replace any light bulbs as soon as they burn out. Set up your furniture so you have a clear path. Avoid moving your furniture around. If any of your floors are uneven, fix them. If there are any pets around you, be aware of where they are. Review your medicines with your doctor. Some medicines can make you feel dizzy. This can increase your chance of falling. Ask your doctor what other things that you can do to help prevent falls. This information is not intended to replace advice given to you by your health care provider. Make sure you discuss any questions you have with your health care provider. Document Released: 05/19/2009 Document Revised: 12/29/2015 Document Reviewed: 08/27/2014 Elsevier Interactive Patient Education  2017 [employer].

## 2021-05-02 ENCOUNTER — Ambulatory Visit (INDEPENDENT_AMBULATORY_CARE_PROVIDER_SITE_OTHER): Payer: Medicare Other

## 2021-05-02 ENCOUNTER — Other Ambulatory Visit: Payer: Self-pay

## 2021-05-02 DIAGNOSIS — R0600 Dyspnea, unspecified: Secondary | ICD-10-CM

## 2021-05-02 DIAGNOSIS — R0609 Other forms of dyspnea: Secondary | ICD-10-CM

## 2021-05-02 MED ORDER — PERFLUTREN LIPID MICROSPHERE
1.0000 mL | INTRAVENOUS | Status: AC | PRN
  Administered 2021-05-02: 2 mL via INTRAVENOUS

## 2021-05-03 LAB — ECHOCARDIOGRAM COMPLETE
AR max vel: 2.62 cm2
AV Area VTI: 2.37 cm2
AV Area mean vel: 2.42 cm2
AV Mean grad: 3 mmHg
AV Peak grad: 5.7 mmHg
Ao pk vel: 1.19 m/s
Area-P 1/2: 4.6 cm2
S' Lateral: 2.6 cm

## 2021-05-22 ENCOUNTER — Other Ambulatory Visit: Payer: Self-pay

## 2021-05-22 ENCOUNTER — Encounter: Payer: Self-pay | Admitting: Cardiology

## 2021-05-22 ENCOUNTER — Telehealth: Payer: Self-pay

## 2021-05-22 ENCOUNTER — Ambulatory Visit: Payer: Medicare Other | Admitting: Cardiology

## 2021-05-22 ENCOUNTER — Other Ambulatory Visit: Payer: Self-pay | Admitting: Student in an Organized Health Care Education/Training Program

## 2021-05-22 VITALS — BP 142/70 | HR 61 | Ht 60.0 in | Wt 142.0 lb

## 2021-05-22 DIAGNOSIS — R0609 Other forms of dyspnea: Secondary | ICD-10-CM

## 2021-05-22 DIAGNOSIS — I2510 Atherosclerotic heart disease of native coronary artery without angina pectoris: Secondary | ICD-10-CM | POA: Diagnosis not present

## 2021-05-22 DIAGNOSIS — Z9889 Other specified postprocedural states: Secondary | ICD-10-CM

## 2021-05-22 DIAGNOSIS — G8921 Chronic pain due to trauma: Secondary | ICD-10-CM

## 2021-05-22 DIAGNOSIS — I5189 Other ill-defined heart diseases: Secondary | ICD-10-CM | POA: Diagnosis not present

## 2021-05-22 DIAGNOSIS — I10 Essential (primary) hypertension: Secondary | ICD-10-CM

## 2021-05-22 DIAGNOSIS — E785 Hyperlipidemia, unspecified: Secondary | ICD-10-CM | POA: Diagnosis not present

## 2021-05-22 DIAGNOSIS — M1652 Unilateral post-traumatic osteoarthritis, left hip: Secondary | ICD-10-CM

## 2021-05-22 MED ORDER — HYDROCODONE-ACETAMINOPHEN 10-325 MG PO TABS: 1.0000 | ORAL_TABLET | ORAL | 0 refills | Status: DC | PRN

## 2021-05-22 MED ORDER — FUROSEMIDE 20 MG PO TABS: 20.0000 mg | ORAL_TABLET | Freq: Every day | ORAL | 3 refills | Status: DC

## 2021-05-22 MED ORDER — METOPROLOL TARTRATE 100 MG PO TABS: 100.0000 mg | ORAL_TABLET | Freq: Three times a day (TID) | ORAL | 1 refills | Status: DC

## 2021-05-22 NOTE — Patient Instructions (Signed)
Medication Instructions:   Your physician has recommended you make the following change in your medication:    START taking Lasix 20 MG once a day.  *If you need a refill on your cardiac medications before your next appointment, please call your pharmacy*   Lab Work:  Your physician recommends that you return for lab work (BMP) in: 1 week    Please return to our office on_____________________at______________am/pm    Testing/Procedures:  None ordered   Follow-Up: At [facility], you and your health needs are our priority.  As part of our continuing mission to provide you with exceptional heart care, we have created designated Provider Care Teams.  These Care Teams include your primary Cardiologist (physician) and Advanced Practice Providers (APPs -  Physician Assistants and Nurse Practitioners) who all work together to provide you with the care you need, when you need it.  We recommend signing up for the patient portal called "MyChart".  Sign up information is provided on this After Visit Summary.  MyChart is used to connect with patients for Virtual Visits (Telemedicine).  Patients are able to view lab/test results, encounter notes, upcoming appointments, etc.  Non-urgent messages can be sent to your provider as well.   To learn more about what you can do with MyChart, go to [email or web address].    Your next appointment:   6 week(s)  The format for your next appointment:   In Person  Provider:   Debbe Odea, MD   Other Instructions

## 2021-05-22 NOTE — Telephone Encounter (Signed)
[facility] [place] [facility]. Note sent to Dr. Cherylann Ratel [facility] [place]. Patient notified.

## 2021-05-22 NOTE — Progress Notes (Signed)
Cardiology Office Note:    Date:  05/22/2021   ID:  PAW KARSTENS, DOB 05/05/1941, MRN [id number]  PCP:  Danelle Berry, PA-C  Cardiologist:  Debbe Odea, MD  Electrophysiologist:  None   Referring MD: Danelle Berry, PA-C   Chief Complaint  Patient presents with   Other    3 month follow up - Patient c.o SOB. Meds reviewed verbally with patient. .     History of Present Illness:    EPSIE WALTHALL is a 80 y.o. 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    Patient being seen due to CAD and hypertension.  Last seen for shortness of breath with exertion, repeat echocardiogram was performed to evaluate systolic and diastolic function.  Blood pressure is controlled at home, usually gets elevated when she presents t to a physician's office.  Presents for echo results, still has shortness of breath with minimal exertion.    Prior notes  In October 2019, she underwent a left heart cath showing multivessel disease but was not seen as a candidate for revascularization.  Medical therapy was recommended.  The plan is if she develops symptoms in the future, high risk PCI may be considered.  echocardiogram on 05/2018 showed normal ejection fraction with EF 60 to 65%  She is being followed by vascular surgery for management of AAA, carotid and peripheral vascular disease.   Patient history of intolerance to several BP meds: hydralazine due to dizziness, also intolerant to Coreg.  She takes Lopressor 100 mg in the morning and 50/100 mg in the evening depending on blood pressure.  She has done this for years and is comfortable with this approach   Past Medical History:  Diagnosis Date   3-vessel CAD    05/2018 LHC with a single remaining conduit which is the  left main coronary artery supplying the LAD.  Both the circumflex and right coronary artery were occluded.  The distal left main, proximal LAD is aneurysmal and heavily calcified.  The LAD supplies well-formed collaterals to the PDA.  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)    EF 60-65%, 05/2018   History of non-ST elevation myocardial infarction (NSTEMI)    05/2018 with subsequent LHC and in the setting of acute aortic ulcer and hematoma   Hyperlipidemia    Hypertension    Internal carotid artery stenosis, left    80-99% 05/2018   Intramural hematoma of thoracic aorta    05/2018   Mitral regurgitation    Mild by 05/2018 echo   Paroxysmal SVT (supraventricular tachycardia) (HCC)    05/2018   Peripheral vascular disease (HCC)    extensive vascular dz s/p b/l carotid endarterectomies (2014) and iliac stenting, AAA, aortic ulcer   Right renal artery stenosis (HCC)    05/2018   Smoking hx    Quit ~1992   Status post bilateral carotid endarterectomy    Followed by Vascular surgery with most recent images 05/2018 showing L ICA 80-99% stenosis   Subclavian artery stenosis, left (HCC)  05/2018    Past Surgical History:  Procedure Laterality Date   APPENDECTOMY     CARDIAC CATHETERIZATION     HIP SURGERY Left    x3   LEFT HEART CATH AND CORONARY ANGIOGRAPHY N/A 05/19/2018   Procedure: LEFT HEART CATH AND CORONARY ANGIOGRAPHY;  Surgeon: Lyn Records, MD;  Location: [facility] INVASIVE CV [facility];  Service: Cardiovascular;  Laterality: N/A;   PERIPHERAL VASCULAR CATHETERIZATION Right 02/02/2016   Procedure: Lower Extremity Angiography;  Surgeon: Annice Needy, MD;  Location: [facility] INVASIVE CV LAB;  Service: Cardiovascular;  Laterality: Right;    Current  Medications: Current Meds  Medication Sig   amLODipine (NORVASC) 10 MG tablet TAKE 1 TABLET BY MOUTH EVERY DAY   aspirin EC 81 MG tablet Take 1 tablet (81 mg total) by mouth daily. Swallow whole.   busPIRone (BUSPAR) 5 MG tablet TAKE 1-2 TABLETS (5-10 MG TOTAL) BY MOUTH 2 (TWO) TIMES DAILY AS NEEDED (ANXIETY/NERVES/PANIC).   cholecalciferol (VITAMIN D) 1000 units tablet Take 1,000 Units by mouth daily.   citalopram (CELEXA) 20 MG tablet Take 1 tablet (20 mg total) by mouth at bedtime.   clopidogrel (PLAVIX) 75 MG tablet TAKE 1 TABLET BY MOUTH EVERY DAY   Cyanocobalamin (VITAMIN B-12 PO) Take 1 tablet by mouth daily.   furosemide (LASIX) 20 MG tablet Take 1 tablet (20 mg total) by mouth daily.   glucosamine-chondroitin 500-400 MG tablet Take 1 tablet by mouth daily.   HYDROcodone-acetaminophen (NORCO) 10-325 MG tablet Take 1 tablet by mouth every 4 (four) hours as needed for severe pain. Must last 30 days.   isosorbide mononitrate (IMDUR) 30 MG 24 hr tablet TAKE 1/2 OF A TABLET (15 MG TOTAL) BY MOUTH DAILY   lisinopril (ZESTRIL) 40 MG tablet TAKE 1 TABLET BY MOUTH EVERY DAY   Multiple Vitamin (MULTIVITAMIN) tablet Take 1 tablet by mouth daily.   potassium citrate (UROCIT-K) 10 MEQ (1080 MG) SR tablet TAKE 1 TABLET BY MOUTH 2 TIMES DAILY.   rosuvastatin (CRESTOR) 40 MG tablet Take 1 tablet (40 mg total) by mouth daily.     Allergies:   Carvedilol, Codeine, Hydralazine, and Sulfa antibiotics   Social History   Socioeconomic History   Marital status: Widowed    Spouse name: Not on file   Number of children: 2   Years of education: Not on file   Highest education level: GED or equivalent  Occupational History   Occupation: diabled  Tobacco Use   Smoking status: Former    Types: Cigarettes    Quit date: 02/02/1999    Years since quitting: 22.3   Smokeless tobacco: Never  Vaping Use   Vaping Use: Never used  Substance and Sexual Activity   Alcohol use: No   Drug use: No   Sexual  activity: Not Currently  Other Topics Concern   Not on file  Social History Narrative   Pt lives alone, does not drive but sister drives her when needed.    Social Determinants of Health   Financial Resource Strain: Low Risk    Difficulty of Paying Living Expenses: Not hard at all  Food Insecurity: No Food Insecurity   Worried About Programme researcher, broadcasting/film/video in the Last Year: Never true   Ran Out of Food in the Last Year: Never true  Transportation Needs: No Transportation Needs   Lack of Transportation (Medical): No   Lack of Transportation (Non-Medical): No  Physical Activity: Inactive   Days of Exercise per Week: 0 days  Minutes of Exercise per Session: 0 min  Stress: No Stress Concern Present   Feeling of Stress : Only a little  Social Connections: Moderately Isolated   Frequency of Communication with Friends and Family: More than three times a week   Frequency of Social Gatherings with Friends and Family: More than three times a week   Attends Religious Services: More than 4 times per year   Active Member of [employer] or Organizations: No   Attends Banker Meetings: Never   Marital Status: Widowed     Family History: The patient's family history includes Heart disease in her son; Hodgkin's lymphoma in her son; Hyperlipidemia in her sister; Hypertension in her father, mother, and sister; Kidney disease in her son; Stroke in her father and mother. There is no history of Mental illness.  ROS:   Please see the history of present illness.     All other systems reviewed and are negative.  EKGs/Labs/Other Studies Reviewed:    The following studies were reviewed today:   EKG:  EKG is  ordered today.  The ekg ordered today demonstrates sinus bradycardia, otherwise normal ECG.  Recent Labs: 08/04/2020: ALT 18; TSH 1.43 02/08/2021: BUN 27; Creat 1.04; Hemoglobin 14.9; Platelets 202; Potassium 5.2; Sodium 139  Recent Lipid Panel    Component Value Date/Time   CHOL 160  08/04/2020 1155   TRIG 238 (H) 08/04/2020 1155   HDL 44 (L) 08/04/2020 1155   CHOLHDL 3.6 08/04/2020 1155   VLDL 28 05/20/2018 0249   LDLCALC 83 08/04/2020 1155    Physical Exam:    VS:  BP (!) 142/70 (BP Location: Left Arm, Patient Position: Sitting, Cuff Size: Normal)   Pulse 61   Ht 5' (1.524 m)   Wt 142 lb (64.4 kg)   SpO2 95%   BMI 27.73 kg/m     Wt Readings from Last 3 Encounters:  05/22/21 142 lb (64.4 kg)  04/27/21 141 lb 8 oz (64.2 kg)  03/16/21 138 lb (62.6 kg)     GEN:  Well nourished, well developed in no acute distress HEENT: Normal NECK: No JVD; bilateral carotid bruits noted LYMPHATICS: No lymphadenopathy CARDIAC: RRR, 2/6 systolic murmur at right upper sternal border RESPIRATORY:  Clear to auscultation without rales, wheezing or rhonchi  ABDOMEN: Soft, non-tender, non-distended MUSCULOSKELETAL:  No edema; No deformity  SKIN: Warm and dry NEUROLOGIC:  Alert and oriented x 3 PSYCHIATRIC:  Normal affect   ASSESSMENT:   . 1. Dyspnea on exertion   2. Diastolic dysfunction   3. Primary hypertension   4. Coronary artery disease involving native coronary artery of native heart without angina pectoris   5. Hyperlipidemia LDL goal <70      PLAN:    In order of problems listed above:  Dyspnea on exertion, history of multivessel CAD.  Echocardiogram 04/2021 showed normal systolic function, grade 2 diastolic dysfunction, EF 60 to 65%.  Start Lasix 20 mg daily, check BMP in 1 week.  Monitor creatinine closely as patient has a solitary kidney. hypertension.  BP elevated, controlled at home.  She has a known component of whitecoat syndrome.  Continue Lopressor, Imdur, Norvasc, lisinopril.   Multivessel CAD, (CTO RCA, CTO Lcx, 60% ostial LM, 80% prox LAD, 70%mid )not candidate for PCI .  Continue  aspirin Plavix, Crestor imdur.  Echo 04/2021 EF 60 to 65%, grade 2 diastolic dysfunction. hyperlipidemia, continue Crestor 40 mg daily  Follow-up in 6 months.    This  note was generated in  part or whole with voice recognition software. Voice recognition is usually quite accurate but there are transcription errors that can and very often do occur. I apologize for any typographical errors that were not detected and corrected.  Medication Adjustments/Labs and Tests Ordered: Current medicines are reviewed at length with the patient today.  Concerns regarding medicines are outlined above.  Orders Placed This Encounter  Procedures   Basic metabolic panel   EKG 12-Lead     Meds ordered this encounter  Medications   furosemide (LASIX) 20 MG tablet    Sig: Take 1 tablet (20 mg total) by mouth daily.    Dispense:  30 tablet    Refill:  3   metoprolol tartrate (LOPRESSOR) 100 MG tablet    Sig: Take 1 tablet (100 mg total) by mouth 3 (three) times daily.    Dispense:  270 tablet    Refill:  1      Patient Instructions  Medication Instructions:   Your physician has recommended you make the following change in your medication:    START taking Lasix 20 MG once a day.  *If you need a refill on your cardiac medications before your next appointment, please call your pharmacy*   Lab Work:  Your physician recommends that you return for lab work (BMP) in: 1 week    Please return to our office on_____________________at______________am/pm    Testing/Procedures:  None ordered   Follow-Up: At [facility], you and your health needs are our priority.  As part of our continuing mission to provide you with exceptional heart care, we have created designated Provider Care Teams.  These Care Teams include your primary Cardiologist (physician) and Advanced Practice Providers (APPs -  Physician Assistants and Nurse Practitioners) who all work together to provide you with the care you need, when you need it.  We recommend signing up for the patient portal called "MyChart".  Sign up information is provided on this After Visit Summary.  MyChart is used to  connect with patients for Virtual Visits (Telemedicine).  Patients are able to view lab/test results, encounter notes, upcoming appointments, etc.  Non-urgent messages can be sent to your provider as well.   To learn more about what you can do with MyChart, go to [email or web address].    Your next appointment:   6 week(s)  The format for your next appointment:   In Person  Provider:   Debbe Odea, MD   Other Instructions    Signed, Debbe Odea, MD  05/22/2021 11:58 AM    [facility]

## 2021-05-22 NOTE — Telephone Encounter (Signed)
Pt asks a nurse to call her back in regards to her medication

## 2021-05-29 ENCOUNTER — Other Ambulatory Visit (INDEPENDENT_AMBULATORY_CARE_PROVIDER_SITE_OTHER): Payer: Medicare Other

## 2021-05-29 ENCOUNTER — Other Ambulatory Visit: Payer: Self-pay

## 2021-05-29 DIAGNOSIS — R0609 Other forms of dyspnea: Secondary | ICD-10-CM | POA: Diagnosis not present

## 2021-05-30 LAB — BASIC METABOLIC PANEL
BUN/Creatinine Ratio: 30 — ABNORMAL HIGH (ref 12–28)
BUN: 32 mg/dL — ABNORMAL HIGH (ref 8–27)
CO2: 29 mmol/L (ref 20–29)
Calcium: 9.6 mg/dL (ref 8.7–10.3)
Chloride: 102 mmol/L (ref 96–106)
Creatinine, Ser: 1.06 mg/dL — ABNORMAL HIGH (ref 0.57–1.00)
Glucose: 88 mg/dL (ref 70–99)
Potassium: 5 mmol/L (ref 3.5–5.2)
Sodium: 144 mmol/L (ref 134–144)
eGFR: 54 mL/min/{1.73_m2} — ABNORMAL LOW (ref >59)

## 2021-06-13 ENCOUNTER — Ambulatory Visit
Payer: Medicare Other | Attending: Student in an Organized Health Care Education/Training Program | Admitting: Student in an Organized Health Care Education/Training Program

## 2021-06-13 ENCOUNTER — Encounter: Payer: Self-pay | Admitting: Student in an Organized Health Care Education/Training Program

## 2021-06-13 ENCOUNTER — Other Ambulatory Visit: Payer: Self-pay

## 2021-06-13 VITALS — BP 157/70 | HR 63 | Temp 97.0°F | Resp 14 | Ht 61.0 in | Wt 140.0 lb

## 2021-06-13 DIAGNOSIS — I25118 Atherosclerotic heart disease of native coronary artery with other forms of angina pectoris: Secondary | ICD-10-CM | POA: Diagnosis present

## 2021-06-13 DIAGNOSIS — M25552 Pain in left hip: Secondary | ICD-10-CM | POA: Diagnosis present

## 2021-06-13 DIAGNOSIS — Z79891 Long term (current) use of opiate analgesic: Secondary | ICD-10-CM | POA: Diagnosis present

## 2021-06-13 DIAGNOSIS — Z9889 Other specified postprocedural states: Secondary | ICD-10-CM | POA: Insufficient documentation

## 2021-06-13 DIAGNOSIS — M1652 Unilateral post-traumatic osteoarthritis, left hip: Secondary | ICD-10-CM | POA: Diagnosis present

## 2021-06-13 DIAGNOSIS — I739 Peripheral vascular disease, unspecified: Secondary | ICD-10-CM | POA: Diagnosis present

## 2021-06-13 DIAGNOSIS — G8921 Chronic pain due to trauma: Secondary | ICD-10-CM | POA: Insufficient documentation

## 2021-06-13 DIAGNOSIS — G8929 Other chronic pain: Secondary | ICD-10-CM | POA: Insufficient documentation

## 2021-06-13 MED ORDER — HYDROCODONE-ACETAMINOPHEN 10-325 MG PO TABS: 1.0000 | ORAL_TABLET | ORAL | 0 refills | Status: DC | PRN
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
## 2021-06-13 NOTE — Progress Notes (Signed)
Nursing Pain Medication Assessment:  Safety precautions to be maintained throughout the outpatient stay will include: orient to surroundings, keep bed in low position, maintain call bell within reach at all times, provide assistance with transfer out of bed and ambulation.  Medication Inspection Compliance: Pill count conducted under aseptic conditions, in front of the patient. Neither the pills nor the bottle was removed from the patient's sight at any time. Once count was completed pills were immediately returned to the patient in their original bottle.  Medication: Hydrocodone/APAP Pill/Patch Count:  57 of 180 pills remain Pill/Patch Appearance: Markings consistent with prescribed medication Bottle Appearance: Standard pharmacy container. Clearly labeled. Filled Date: 10/ 17 / 2022 Last Medication intake:  Today

## 2021-07-07 ENCOUNTER — Other Ambulatory Visit: Payer: Self-pay

## 2021-07-07 ENCOUNTER — Ambulatory Visit: Payer: Medicare Other | Admitting: Cardiology

## 2021-07-07 ENCOUNTER — Encounter: Payer: Self-pay | Admitting: Cardiology

## 2021-07-07 VITALS — BP 132/80 | HR 59 | Ht 61.0 in | Wt 142.0 lb

## 2021-07-07 DIAGNOSIS — I10 Essential (primary) hypertension: Secondary | ICD-10-CM

## 2021-07-07 DIAGNOSIS — I5189 Other ill-defined heart diseases: Secondary | ICD-10-CM | POA: Diagnosis not present

## 2021-07-07 DIAGNOSIS — E785 Hyperlipidemia, unspecified: Secondary | ICD-10-CM

## 2021-07-07 DIAGNOSIS — I2510 Atherosclerotic heart disease of native coronary artery without angina pectoris: Secondary | ICD-10-CM | POA: Diagnosis not present

## 2021-07-07 NOTE — Patient Instructions (Signed)
Medication Instructions:  ? ?Your physician recommends that you continue on your current medications as directed. Please refer to the Current Medication list given to you today. ? ?*If you need a refill on your cardiac medications before your next appointment, please call your pharmacy* ? ? ?Lab Work: ? ?None Ordered ? ?If you have labs (blood work) drawn today and your tests are completely normal, you will receive your results only by: ?MyChart Message (if you have MyChart) OR ?A paper copy in the mail ?If you have any lab test that is abnormal or we need to change your treatment, we will call you to review the results. ? ? ?Testing/Procedures: ? ?None Ordered ? ? ?Follow-Up: ?At [facility], you and your health needs are our priority.  As part of our continuing mission to provide you with exceptional heart care, we have created designated Provider Care Teams.  These Care Teams include your primary Cardiologist (physician) and Advanced Practice Providers (APPs -  Physician Assistants and Nurse Practitioners) who all work together to provide you with the care you need, when you need it. ? ?We recommend signing up for the patient portal called "MyChart".  Sign up information is provided on this After Visit Summary.  MyChart is used to connect with patients for Virtual Visits (Telemedicine).  Patients are able to view lab/test results, encounter notes, upcoming appointments, etc.  Non-urgent messages can be sent to your provider as well.   ?To learn more about what you can do with MyChart, go to [email or web address].   ? ?Your next appointment:   ?12 month(s) ? ?The format for your next appointment:   ?In Person ? ?Provider:   ?You may see Brian Agbor-Etang, MD or one of the following Advanced Practice Providers on your designated Care Team:   ?Christopher Berge, NP ?Ryan Dunn, PA-C ?Cadence Furth, PA-C{ ? ? ?

## 2021-07-07 NOTE — Progress Notes (Signed)
Cardiology Office Note:    Date:  07/07/2021   ID:  Elizabeth [name], DOB July 04, 1941, MRN [id number]  PCP:  Danelle Berry, PA-C  Cardiologist:  Debbe Odea, MD  Electrophysiologist:  None   Referring MD: Danelle Berry, PA-C   Chief Complaint  Patient presents with   6 week follow up     Patient c/o shortness of breath but is much better. Medications reviewed by the patient verbally.     History of Present Illness:    Elizabeth [name] is a 80 y.o. 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    Patient being seen due to CAD, diastolic dysfunction.  Last echo showed grade 2 diastolic dysfunction.  Started on Lasix after last visit.  She states shortness of breath is much improved.     Prior notes Echocardiogram 04/2021 EF 60 to 65%, grade 2 diastolic dysfunction.  In October 2019, she underwent a left heart cath showing multivessel disease but was not seen as a candidate for revascularization.  Medical therapy was recommended.  The plan is if she develops symptoms in the future, high risk PCI may be considered.  echocardiogram on 05/2018 showed normal ejection fraction with EF 60 to 65%  She is being followed by vascular surgery for management of AAA, carotid and peripheral vascular disease.   Patient history of intolerance to several BP meds: hydralazine due to dizziness, also intolerant to Coreg.  She takes Lopressor 100 mg in the morning and 50/100 mg in the evening depending on blood pressure.  She has done this for years and is comfortable with this approach   Past Medical History:  Diagnosis Date   3-vessel CAD    05/2018 LHC with a single remaining conduit which is the left main coronary artery supplying the LAD.  Both the circumflex and right  coronary artery were occluded.  The distal left main, proximal LAD is aneurysmal and heavily calcified.  The LAD supplies well-formed collaterals to the PDA.  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)    EF 60-65%, 05/2018   History of non-ST elevation myocardial infarction (NSTEMI)    05/2018 with subsequent LHC and in the setting of acute aortic ulcer and hematoma   Hyperlipidemia    Hypertension    Internal carotid artery stenosis, left    80-99% 05/2018   Intramural hematoma of thoracic aorta    05/2018   Mitral regurgitation    Mild by 05/2018 echo   Paroxysmal SVT (supraventricular tachycardia) (HCC)    05/2018   Peripheral vascular disease (HCC)    extensive vascular dz s/p b/l carotid endarterectomies (2014) and iliac stenting, AAA, aortic ulcer   Right renal artery stenosis (HCC)    05/2018   Smoking hx    Quit ~1992   Status post bilateral carotid endarterectomy    Followed by Vascular surgery with most recent images 05/2018 showing L ICA 80-99% stenosis   Subclavian artery stenosis, left (HCC)    05/2018    Past Surgical History:  Procedure Laterality  Date   APPENDECTOMY     CARDIAC CATHETERIZATION     HIP SURGERY Left    x3   LEFT HEART CATH AND CORONARY ANGIOGRAPHY N/A 05/19/2018   Procedure: LEFT HEART CATH AND CORONARY ANGIOGRAPHY;  Surgeon: Lyn Records, MD;  Location: [facility];  Service: Cardiovascular;  Laterality: N/A;   PERIPHERAL VASCULAR CATHETERIZATION Right 02/02/2016   Procedure: Lower Extremity Angiography;  Surgeon: Annice Needy, MD;  Location: [facility] INVASIVE CV [facility];  Service: Cardiovascular;  Laterality: Right;    Current Medications: Current Meds  Medication Sig   amLODipine (NORVASC) 10 MG tablet TAKE 1 TABLET  BY MOUTH EVERY DAY   aspirin EC 81 MG tablet Take 1 tablet (81 mg total) by mouth daily. Swallow whole.   busPIRone (BUSPAR) 5 MG tablet TAKE 1-2 TABLETS (5-10 MG TOTAL) BY MOUTH 2 (TWO) TIMES DAILY AS NEEDED (ANXIETY/NERVES/PANIC).   cholecalciferol (VITAMIN D) 1000 units tablet Take 1,000 Units by mouth daily.   citalopram (CELEXA) 20 MG tablet Take 1 tablet (20 mg total) by mouth at bedtime.   clopidogrel (PLAVIX) 75 MG tablet TAKE 1 TABLET BY MOUTH EVERY DAY   Cyanocobalamin (VITAMIN B-12 PO) Take 1 tablet by mouth daily.   furosemide (LASIX) 20 MG tablet Take 1 tablet (20 mg total) by mouth daily.   glucosamine-chondroitin 500-400 MG tablet Take 1 tablet by mouth daily.   HYDROcodone-acetaminophen (NORCO) 10-325 MG tablet Take 1 tablet by mouth every 4 (four) hours as needed for severe pain. Must last 30 days.   isosorbide mononitrate (IMDUR) 30 MG 24 hr tablet TAKE 1/2 OF A TABLET (15 MG TOTAL) BY MOUTH DAILY   lisinopril (ZESTRIL) 40 MG tablet TAKE 1 TABLET BY MOUTH EVERY DAY   metoprolol tartrate (LOPRESSOR) 100 MG tablet Take 1 tablet (100 mg total) by mouth 3 (three) times daily.   Multiple Vitamin (MULTIVITAMIN) tablet Take 1 tablet by mouth daily.   nitroGLYCERIN (NITROSTAT) 0.4 MG SL tablet Place 1 tablet (0.4 mg total) under the tongue every 5 (five) minutes as needed for chest pain.   potassium citrate (UROCIT-K) 10 MEQ (1080 MG) SR tablet TAKE 1 TABLET BY MOUTH 2 TIMES DAILY.   rosuvastatin (CRESTOR) 40 MG tablet Take 1 tablet (40 mg total) by mouth daily.     Allergies:   Carvedilol, Codeine, Hydralazine, and Sulfa antibiotics   Social History   Socioeconomic History   Marital status: Widowed    Spouse name: Not on file   Number of children: 2   Years of education: Not on file   Highest education level: GED or equivalent  Occupational History   Occupation: diabled  Tobacco Use   Smoking status: Former    Types: Cigarettes    Quit date: 02/02/1999    Years since  quitting: 22.4   Smokeless tobacco: Never  Vaping Use   Vaping Use: Never used  Substance and Sexual Activity   Alcohol use: No   Drug use: No   Sexual activity: Not Currently  Other Topics Concern   Not on file  Social History Narrative   Pt lives alone, does not drive but sister drives her when needed.    Social Determinants of Health   Financial Resource Strain: Low Risk    Difficulty of Paying Living Expenses: Not hard at all  Food Insecurity: No Food Insecurity   Worried About Programme researcher, broadcasting/film/video in the Last Year: Never true   Ran Out of Food in the Last Year:  Never true  Transportation Needs: No Transportation Needs   Lack of Transportation (Medical): No   Lack of Transportation (Non-Medical): No  Physical Activity: Inactive   Days of Exercise per Week: 0 days   Minutes of Exercise per Session: 0 min  Stress: No Stress Concern Present   Feeling of Stress : Only a little  Social Connections: Moderately Isolated   Frequency of Communication with Friends and Family: More than three times a week   Frequency of Social Gatherings with Friends and Family: More than three times a week   Attends Religious Services: More than 4 times per year   Active Member of [employer] or Organizations: No   Attends Banker Meetings: Never   Marital Status: Widowed     Family History: The patient's family history includes Heart disease in her son; Hodgkin's lymphoma in her son; Hyperlipidemia in her sister; Hypertension in her father, mother, and sister; Kidney disease in her son; Stroke in her father and mother. There is no history of Mental illness.  ROS:   Please see the history of present illness.     All other systems reviewed and are negative.  EKGs/Labs/Other Studies Reviewed:    The following studies were reviewed today:   EKG:  EKG not ordered today.   Recent Labs: 08/04/2020: ALT 18; TSH 1.43 02/08/2021: Hemoglobin 14.9; Platelets 202 05/29/2021: BUN 32;  Creatinine, Ser 1.06; Potassium 5.0; Sodium 144  Recent Lipid Panel    Component Value Date/Time   CHOL 160 08/04/2020 1155   TRIG 238 (H) 08/04/2020 1155   HDL 44 (L) 08/04/2020 1155   CHOLHDL 3.6 08/04/2020 1155   VLDL 28 05/20/2018 0249   LDLCALC 83 08/04/2020 1155    Physical Exam:    VS:  BP 132/80 (BP Location: Right Arm, Patient Position: Sitting, Cuff Size: Normal)   Pulse (!) 59   Ht 5\' 1"  (1.549 m)   Wt 142 lb (64.4 kg)   SpO2 97%   BMI 26.83 kg/m     Wt Readings from Last 3 Encounters:  07/07/21 142 lb (64.4 kg)  06/13/21 140 lb (63.5 kg)  05/22/21 142 lb (64.4 kg)     GEN:  Well nourished, well developed in no acute distress HEENT: Normal NECK: No JVD; bilateral carotid bruits noted LYMPHATICS: No lymphadenopathy CARDIAC: RRR, 2/6 systolic murmur at right upper sternal border RESPIRATORY:  Clear to auscultation without rales, wheezing or rhonchi  ABDOMEN: Soft, non-tender, non-distended MUSCULOSKELETAL:  No edema; No deformity  SKIN: Warm and dry NEUROLOGIC:  Alert and oriented x 3 PSYCHIATRIC:  Normal affect   ASSESSMENT:   . 1. Diastolic dysfunction   2. Primary hypertension   3. Coronary artery disease involving native coronary artery of native heart without angina pectoris   4. Hyperlipidemia LDL goal <70     PLAN:    In order of problems listed above:  Diastolic dysfunction grade 2.  Echocardiogram 04/2021 showed normal systolic function, grade 2 diastolic dysfunction, EF 60 to 65%.  Creatinine stable, shortness of breath improved.  Continue Lasix 20 mg daily. hypertension.  BP controlled.  Continue Lopressor, Imdur, Norvasc, lisinopril.   Multivessel CAD, (CTO RCA, CTO Lcx, 60% ostial LM, 80% prox LAD, 70%mid )not candidate for PCI .  Continue  aspirin Plavix, Crestor imdur.  Echo 04/2021 EF 60 to 65%, grade 2 diastolic dysfunction. hyperlipidemia, LDL at goal.  Continue Crestor 40 mg daily  Follow-up yearly    This note was generated in  part  or whole with voice recognition software. Voice recognition is usually quite accurate but there are transcription errors that can and very often do occur. I apologize for any typographical errors that were not detected and corrected.  Medication Adjustments/Labs and Tests Ordered: Current medicines are reviewed at length with the patient today.  Concerns regarding medicines are outlined above.  No orders of the defined types were placed in this encounter.    No orders of the defined types were placed in this encounter.     Patient Instructions  Medication Instructions:   Your physician recommends that you continue on your current medications as directed. Please refer to the Current Medication list given to you today.  *If you need a refill on your cardiac medications before your next appointment, please call your pharmacy*   Lab Work:  None Ordered  If you have labs (blood work) drawn today and your tests are completely normal, you will receive your results only by: MyChart Message (if you have MyChart) OR A paper copy in the mail If you have any lab test that is abnormal or we need to change your treatment, we will call you to review the results.   Testing/Procedures:  None Ordered   Follow-Up: At [facility], you and your health needs are our priority.  As part of our continuing mission to provide you with exceptional heart care, we have created designated Provider Care Teams.  These Care Teams include your primary Cardiologist (physician) and Advanced Practice Providers (APPs -  Physician Assistants and Nurse Practitioners) who all work together to provide you with the care you need, when you need it.  We recommend signing up for the patient portal called "MyChart".  Sign up information is provided on this After Visit Summary.  MyChart is used to connect with patients for Virtual Visits (Telemedicine).  Patients are able to view lab/test results, encounter notes,  upcoming appointments, etc.  Non-urgent messages can be sent to your provider as well.   To learn more about what you can do with MyChart, go to [email or web address].    Your next appointment:   12 month(s)  The format for your next appointment:   In Person  Provider:   You may see Debbe Odea, MD or one of the following Advanced Practice Providers on your designated Care Team:   Nicolasa Ducking, NP Eula Listen, PA-C Cadence Fransico Michael, [place]   Signed, Debbe Odea, MD  07/07/2021 3:01 PM    [facility]

## 2021-08-11 ENCOUNTER — Ambulatory Visit: Payer: Medicare Other | Admitting: Family Medicine

## 2021-08-14 ENCOUNTER — Encounter: Payer: Self-pay | Admitting: Internal Medicine

## 2021-08-14 ENCOUNTER — Ambulatory Visit (INDEPENDENT_AMBULATORY_CARE_PROVIDER_SITE_OTHER): Payer: Medicare Other | Admitting: Internal Medicine

## 2021-08-14 VITALS — BP 136/70 | HR 63 | Temp 98.4°F | Resp 16 | Ht 61.0 in | Wt 142.8 lb

## 2021-08-14 DIAGNOSIS — I10 Essential (primary) hypertension: Secondary | ICD-10-CM

## 2021-08-14 DIAGNOSIS — Z23 Encounter for immunization: Secondary | ICD-10-CM | POA: Diagnosis not present

## 2021-08-14 DIAGNOSIS — F419 Anxiety disorder, unspecified: Secondary | ICD-10-CM | POA: Diagnosis not present

## 2021-08-14 DIAGNOSIS — Z1159 Encounter for screening for other viral diseases: Secondary | ICD-10-CM | POA: Diagnosis not present

## 2021-08-14 DIAGNOSIS — I2510 Atherosclerotic heart disease of native coronary artery without angina pectoris: Secondary | ICD-10-CM

## 2021-08-14 DIAGNOSIS — E781 Pure hyperglyceridemia: Secondary | ICD-10-CM

## 2021-08-14 NOTE — Patient Instructions (Signed)
It was great seeing you today!  Plan discussed at today's visit: -Blood work ordered today, results will be uploaded to MyChart.  -Pneumonia vaccine given today  Follow up in: 6 months   Take care and let us know if you have any questions or concerns prior to your next visit.  Dr. Caralee Ates

## 2021-08-14 NOTE — Progress Notes (Signed)
Established Patient Office Visit  Subjective:  Patient ID: Elizabeth [name], female    DOB: 1940-12-08  Age: 81 y.o. MRN: [id number]  CC:  Chief Complaint  Patient presents with   Follow-up   Hypertension   Anxiety    HPI Elizabeth [name] presents for follow up on chronic medical conditions.   Hypertension: -Medications: Lisinopril 40, Metoprolol 100 TID, Amlodipine 10, Imdur 30, Lasix 20 -Patient is compliant with above medications and reports no side effects. -Checking BP at home (average): 125-150/75-85 -Denies any SOB, CP, vision changes, LE edema or symptoms of hypotension. Will get facial flushing if BP high but this hasn't happened in several months  -Follows with Cardiology, last saw in December, now will see every year  HLD/CAD/PVD: -Medications: Crestor 40, Plavix 75, aspirin -Patient is compliant with above medications and reports no side effects. No abnormal bleeding.  -Last lipid panel: 12/21: TC 160. HDL 44, triglycerides 238, LDL 83  Anxiety: -Currently on Celexa 20, Buspar 5 once a day, sometimes will take twice a day -Moods stable, doing well on current doses -Daily compliance without side effects  Health Maintenance: -Blood work due -Breast cancer screening: no longer screening per patient's wishes -Colon cancer screening: no longer screening per patient's wishes   Past Medical History:  Diagnosis Date   3-vessel CAD    05/2018 LHC with a single remaining conduit which is the left main coronary artery supplying the LAD.  Both the circumflex and right coronary artery were occluded.  The distal left main, proximal LAD is aneurysmal and heavily calcified.  The LAD supplies well-formed collaterals to the PDA.  Not felt to be candidate for revascularization and with recommendation for medical mgmt   AAA (abdominal aortic aneurysm)    4.3cm (12/2018)   Celiac artery stenosis (HCC)    Degenerative joint disease of left hip    Heart failure with preserved  ejection fraction (HCC)    EF 60-65%, 05/2018   History of non-ST elevation myocardial infarction (NSTEMI)    05/2018 with subsequent LHC and in the setting of acute aortic ulcer and hematoma   Hyperlipidemia    Hypertension    Internal carotid artery stenosis, left    80-99% 05/2018   Intramural hematoma of thoracic aorta    05/2018   Mitral regurgitation    Mild by 05/2018 echo   Paroxysmal SVT (supraventricular tachycardia) ([place])    05/2018   Peripheral vascular disease ([place])    extensive vascular dz s/p b/l carotid endarterectomies (2014) and iliac stenting, AAA, aortic ulcer   Right renal artery stenosis ([place])    05/2018   Smoking hx    Quit ~1992   Status post bilateral carotid endarterectomy    Followed by Vascular surgery with most recent images 05/2018 showing L ICA 80-99% stenosis   Subclavian artery stenosis, left ([place])    05/2018    Past Surgical History:  Procedure Laterality Date   APPENDECTOMY     CARDIAC CATHETERIZATION     HIP SURGERY Left    x3   LEFT HEART CATH AND CORONARY ANGIOGRAPHY N/A 05/19/2018   Procedure: LEFT HEART CATH AND CORONARY ANGIOGRAPHY;  Surgeon: Belva Crome, MD;  Location: [place] [facility];  Service: Cardiovascular;  Laterality: N/A;   PERIPHERAL VASCULAR CATHETERIZATION Right 02/02/2016   Procedure: Lower Extremity Angiography;  Surgeon: Algernon Huxley, MD;  Location: [place] [facility];  Service: Cardiovascular;  Laterality: Right;    Family History  Problem Relation  Age of Onset   Hypertension Mother    Stroke Mother    Hypertension Father    Stroke Father    Hypertension Sister    Hyperlipidemia Sister    Hodgkin's lymphoma Son    Heart disease Son    Kidney disease Son    Mental illness Neg Hx     Social History   Socioeconomic History   Marital status: Widowed    Spouse name: Not on file   Number of children: 2   Years of education: Not on file   Highest education level: GED or equivalent  Occupational History    Occupation: diabled  Tobacco Use   Smoking status: Former    Types: Cigarettes    Quit date: 02/02/1999    Years since quitting: 22.5   Smokeless tobacco: Never  Vaping Use   Vaping Use: Never used  Substance and Sexual Activity   Alcohol use: No   Drug use: No   Sexual activity: Not Currently  Other Topics Concern   Not on file  Social History Narrative   Pt lives alone, does not drive but sister drives her when needed.    Social Determinants of Health   Financial Resource Strain: Low Risk    Difficulty of Paying Living Expenses: Not hard at all  Food Insecurity: No Food Insecurity   Worried About Charity fundraiser in the Last Year: Never true   [place] in the Last Year: Never true  Transportation Needs: No Transportation Needs   Lack of Transportation (Medical): No   Lack of Transportation (Non-Medical): No  Physical Activity: Inactive   Days of Exercise per Week: 0 days   Minutes of Exercise per Session: 0 min  Stress: No Stress Concern Present   Feeling of Stress : Only a little  Social Connections: Moderately Isolated   Frequency of Communication with Friends and Family: More than three times a week   Frequency of Social Gatherings with Friends and Family: More than three times a week   Attends Religious Services: More than 4 times per year   Active Member of [employer] or Organizations: No   Attends Archivist Meetings: Never   Marital Status: Widowed  Human resources officer Violence: Not At Risk   Fear of Current or Ex-Partner: No   Emotionally Abused: No   Physically Abused: No   Sexually Abused: No    Outpatient Medications Prior to Visit  Medication Sig Dispense Refill   amLODipine (NORVASC) 10 MG tablet TAKE 1 TABLET BY MOUTH EVERY DAY 90 tablet 3   aspirin EC 81 MG tablet Take 1 tablet (81 mg total) by mouth daily. Swallow whole. 90 tablet 3   busPIRone (BUSPAR) 5 MG tablet TAKE 1-2 TABLETS (5-10 MG TOTAL) BY MOUTH 2 (TWO) TIMES DAILY AS  NEEDED (ANXIETY/NERVES/PANIC). 180 tablet 1   cholecalciferol (VITAMIN D) 1000 units tablet Take 1,000 Units by mouth daily.     citalopram (CELEXA) 20 MG tablet Take 1 tablet (20 mg total) by mouth at bedtime. 90 tablet 3   clopidogrel (PLAVIX) 75 MG tablet TAKE 1 TABLET BY MOUTH EVERY DAY 90 tablet 3   Cyanocobalamin (VITAMIN B-12 PO) Take 1 tablet by mouth daily.     furosemide (LASIX) 20 MG tablet Take 1 tablet (20 mg total) by mouth daily. 30 tablet 3   glucosamine-chondroitin 500-400 MG tablet Take 1 tablet by mouth daily.     HYDROcodone-acetaminophen (NORCO) 10-325 MG tablet Take 1 tablet by  mouth every 4 (four) hours as needed for severe pain. Must last 30 days. 180 tablet 0   HYDROcodone-acetaminophen (NORCO) 10-325 MG tablet Take 1 tablet by mouth every 4 (four) hours as needed for severe pain. Must last 30 days. (Patient not taking: Reported on 07/07/2021) 180 tablet 0   [START ON 08/20/2021] HYDROcodone-acetaminophen (NORCO) 10-325 MG tablet Take 1 tablet by mouth every 4 (four) hours as needed for severe pain. Must last 30 days. (Patient not taking: Reported on 07/07/2021) 180 tablet 0   isosorbide mononitrate (IMDUR) 30 MG 24 hr tablet TAKE 1/2 OF A TABLET (15 MG TOTAL) BY MOUTH DAILY 45 tablet 3   lisinopril (ZESTRIL) 40 MG tablet TAKE 1 TABLET BY MOUTH EVERY DAY 90 tablet 3   metoprolol tartrate (LOPRESSOR) 100 MG tablet Take 1 tablet (100 mg total) by mouth 3 (three) times daily. 270 tablet 1   Multiple Vitamin (MULTIVITAMIN) tablet Take 1 tablet by mouth daily.     nitroGLYCERIN (NITROSTAT) 0.4 MG SL tablet Place 1 tablet (0.4 mg total) under the tongue every 5 (five) minutes as needed for chest pain. 25 tablet 3   potassium citrate (UROCIT-K) 10 MEQ (1080 MG) SR tablet TAKE 1 TABLET BY MOUTH 2 TIMES DAILY. 60 tablet 5   rosuvastatin (CRESTOR) 40 MG tablet Take 1 tablet (40 mg total) by mouth daily. 90 tablet 3   No facility-administered medications prior to visit.    Allergies   Allergen Reactions   Carvedilol Other (See Comments)    intolerance   Codeine Other (See Comments)    Reaction: Unsure   Hydralazine Other (See Comments)    intolerance   Sulfa Antibiotics Other (See Comments)    Mouth blisters    ROS Review of Systems  Constitutional:  Negative for activity change, appetite change, chills and fever.  Eyes:  Negative for visual disturbance.  Respiratory:  Negative for cough and shortness of breath.   Cardiovascular:  Negative for chest pain.  Gastrointestinal:  Negative for abdominal pain and blood in stool.  Genitourinary:  Negative for hematuria.  Neurological:  Negative for dizziness and headaches.  Hematological:  Bruises/bleeds easily.     Objective:    Physical Exam Constitutional:      Appearance: Normal appearance.  HENT:     Head: Normocephalic and atraumatic.  Eyes:     Conjunctiva/sclera: Conjunctivae normal.  Cardiovascular:     Rate and Rhythm: Normal rate and regular rhythm.  Pulmonary:     Effort: Pulmonary effort is normal.     Breath sounds: Normal breath sounds.  Musculoskeletal:     Right lower leg: No edema.     Left lower leg: No edema.  Skin:    General: Skin is warm and dry.  Neurological:     General: No focal deficit present.     Mental Status: She is alert. Mental status is at baseline.  Psychiatric:        Mood and Affect: Mood normal.        Behavior: Behavior normal.    BP (!) 158/74    Pulse 63    Temp 98.4 F (36.9 C) (Oral)    Resp 16    Ht _0  (1.549 m)    Wt 142 lb 12.8 oz (64.8 kg)    SpO2 97%    BMI 26.98 kg/m  Wt Readings from Last 3 Encounters:  08/14/21 142 lb 12.8 oz (64.8 kg)  07/07/21 142 lb (64.4 kg)  06/13/21 140 lb (  63.5 kg)     Health Maintenance Due  Topic Date Due   Pneumonia Vaccine 42+ Years old (1 - PCV) Never done   Hepatitis C Screening  Never done   TETANUS/TDAP  Never done   Zoster Vaccines- Shingrix (1 of 2) Never done   DEXA SCAN  Never done    There are  no preventive care reminders to display for this patient.  Lab Results  Component Value Date   TSH 1.43 08/04/2020   Lab Results  Component Value Date   WBC 6.1 02/08/2021   HGB 14.9 02/08/2021   HCT 44.9 02/08/2021   MCV 102.0 (H) 02/08/2021   PLT 202 02/08/2021   Lab Results  Component Value Date   NA 144 05/29/2021   K 5.0 05/29/2021   CO2 29 05/29/2021   GLUCOSE 88 05/29/2021   BUN 32 (H) 05/29/2021   CREATININE 1.06 (H) 05/29/2021   BILITOT 0.4 08/04/2020   ALKPHOS 62 05/18/2018   AST 23 08/04/2020   ALT 18 08/04/2020   PROT 7.4 08/04/2020   ALBUMIN 2.8 (L) 05/21/2018   CALCIUM 9.6 05/29/2021   ANIONGAP 10 03/13/2020   EGFR 54 (L) 05/29/2021   Lab Results  Component Value Date   CHOL 160 08/04/2020   Lab Results  Component Value Date   HDL 44 (L) 08/04/2020   Lab Results  Component Value Date   LDLCALC 83 08/04/2020   Lab Results  Component Value Date   TRIG 238 (H) 08/04/2020   Lab Results  Component Value Date   CHOLHDL 3.6 08/04/2020   Lab Results  Component Value Date   HGBA1C 5 12/16/2020      Assessment & Plan:   1. Essential hypertension: BP stable, no changes to medications made. She is due for routine labs including CBC, CMP. Follow up in 6 months for recheck.  - CBC w/Diff/Platelet - COMPLETE METABOLIC PANEL WITH GFR  2. Coronary artery disease involving native heart, unspecified vessel or lesion type, unspecified whether angina present: Stable, continue statin and repeat lipid panel.   - Lipid Profile  3. Need for hepatitis C screening test: Hepatitis C screening with above labs.   - Hepatitis C Antibody  4. Vaccine for streptococcus pneumoniae and influenza: Prevnar 20 administered today.  - Pneumococcal conjugate vaccine 20-valent (Prevnar 20)  5. Anxiety: Stable, continue Celexa and Buspar.   Follow-up: Return in about 6 months (around 02/11/2022).    Teodora Medici, DO

## 2021-08-16 LAB — COMPLETE METABOLIC PANEL WITH GFR
AG Ratio: 1.4 (calc) (ref 1.0–2.5)
ALT: 13 U/L (ref 6–29)
AST: 21 U/L (ref 10–35)
Albumin: 4.3 g/dL (ref 3.6–5.1)
Alkaline phosphatase (APISO): 78 U/L (ref 37–153)
BUN/Creatinine Ratio: 28 (calc) — ABNORMAL HIGH (ref 6–22)
BUN: 31 mg/dL — ABNORMAL HIGH (ref 7–25)
CO2: 32 mmol/L (ref 20–32)
Calcium: 9.6 mg/dL (ref 8.6–10.4)
Chloride: 103 mmol/L (ref 98–110)
Creat: 1.1 mg/dL — ABNORMAL HIGH (ref 0.60–1.00)
Globulin: 3 g/dL (calc) (ref 1.9–3.7)
Glucose, Bld: 102 mg/dL — ABNORMAL HIGH (ref 65–99)
Potassium: 4.7 mmol/L (ref 3.5–5.3)
Sodium: 140 mmol/L (ref 135–146)
Total Bilirubin: 0.4 mg/dL (ref 0.2–1.2)
Total Protein: 7.3 g/dL (ref 6.1–8.1)
eGFR: 51 mL/min/{1.73_m2} — ABNORMAL LOW (ref >=60)

## 2021-08-16 LAB — CBC WITH DIFFERENTIAL/PLATELET
Absolute Monocytes: 602 cells/uL (ref 200–950)
Basophils Absolute: 13 cells/uL (ref 0–200)
Basophils Relative: 0.2 %
Eosinophils Absolute: 198 cells/uL (ref 15–500)
Eosinophils Relative: 3.1 %
HCT: 42.9 % (ref 35.0–45.0)
Hemoglobin: 14.6 g/dL (ref 11.7–15.5)
Lymphs Abs: 1888 cells/uL (ref 850–3900)
MCH: 34.1 pg — ABNORMAL HIGH (ref 27.0–33.0)
MCHC: 34 g/dL (ref 32.0–36.0)
MCV: 100.2 fL — ABNORMAL HIGH (ref 80.0–100.0)
MPV: 11.1 fL (ref 7.5–12.5)
Monocytes Relative: 9.4 %
Neutro Abs: 3699 cells/uL (ref 1500–7800)
Neutrophils Relative %: 57.8 %
Platelets: 221 10*3/uL (ref 140–400)
RBC: 4.28 10*6/uL (ref 3.80–5.10)
RDW: 12.3 % (ref 11.0–15.0)
Total Lymphocyte: 29.5 %
WBC: 6.4 10*3/uL (ref 3.8–10.8)

## 2021-08-16 LAB — LIPID PANEL
Cholesterol: 138 mg/dL (ref <200)
HDL: 39 mg/dL — ABNORMAL LOW (ref >=50)
LDL Cholesterol (Calc): 69 mg/dL (calc)
Non-HDL Cholesterol (Calc): 99 mg/dL (calc) (ref <130)
Total CHOL/HDL Ratio: 3.5 (calc) (ref <5.0)
Triglycerides: 256 mg/dL — ABNORMAL HIGH (ref <150)

## 2021-08-16 LAB — HEPATITIS C ANTIBODY
Hepatitis C Ab: NONREACTIVE
SIGNAL TO CUT-OFF: 0.08 (ref <1.00)

## 2021-08-17 ENCOUNTER — Other Ambulatory Visit: Payer: Self-pay

## 2021-08-17 MED ORDER — FUROSEMIDE 20 MG PO TABS: 20.0000 mg | ORAL_TABLET | Freq: Every day | ORAL | 3 refills | Status: DC

## 2021-08-18 MED ORDER — ICOSAPENT ETHYL 1 G PO CAPS: 2.0000 g | ORAL_CAPSULE | Freq: Two times a day (BID) | ORAL | 3 refills | Status: DC

## 2021-08-18 NOTE — Addendum Note (Signed)
Addended by: Margarita Mail on: 08/18/2021 08:58 AM   Modules accepted: Orders

## 2021-08-29 ENCOUNTER — Other Ambulatory Visit: Payer: Self-pay

## 2021-08-29 ENCOUNTER — Other Ambulatory Visit (INDEPENDENT_AMBULATORY_CARE_PROVIDER_SITE_OTHER): Payer: Self-pay | Admitting: Vascular Surgery

## 2021-08-29 ENCOUNTER — Ambulatory Visit (INDEPENDENT_AMBULATORY_CARE_PROVIDER_SITE_OTHER): Payer: Medicare Other

## 2021-08-29 ENCOUNTER — Encounter (INDEPENDENT_AMBULATORY_CARE_PROVIDER_SITE_OTHER): Payer: Self-pay | Admitting: Vascular Surgery

## 2021-08-29 ENCOUNTER — Ambulatory Visit (INDEPENDENT_AMBULATORY_CARE_PROVIDER_SITE_OTHER): Payer: Medicare Other | Admitting: Vascular Surgery

## 2021-08-29 VITALS — BP 178/81 | HR 64 | Resp 16 | Wt 142.4 lb

## 2021-08-29 DIAGNOSIS — I6523 Occlusion and stenosis of bilateral carotid arteries: Secondary | ICD-10-CM

## 2021-08-29 DIAGNOSIS — I714 Abdominal aortic aneurysm, without rupture, unspecified: Secondary | ICD-10-CM | POA: Diagnosis not present

## 2021-08-29 DIAGNOSIS — I739 Peripheral vascular disease, unspecified: Secondary | ICD-10-CM

## 2021-08-29 DIAGNOSIS — I10 Essential (primary) hypertension: Secondary | ICD-10-CM

## 2021-08-29 DIAGNOSIS — Z9889 Other specified postprocedural states: Secondary | ICD-10-CM | POA: Diagnosis not present

## 2021-08-29 DIAGNOSIS — N183 Chronic kidney disease, stage 3 unspecified: Secondary | ICD-10-CM

## 2021-08-29 DIAGNOSIS — I7143 Infrarenal abdominal aortic aneurysm, without rupture: Secondary | ICD-10-CM

## 2021-08-29 DIAGNOSIS — E785 Hyperlipidemia, unspecified: Secondary | ICD-10-CM

## 2021-08-29 NOTE — Progress Notes (Signed)
Patient ID: Elizabeth [name], female   DOB: 08-13-1940, 81 y.o.   MRN: [id number]  Chief Complaint  Patient presents with   Follow-up    Establish vascular care     HPI Elizabeth [name] is a 81 y.o. female.  I am asked to see the patient by L. Lucio Edward for evaluation of a large number of vascular issues.  She was previously seen in the practice but had not been seen in their office since 2019.  She has a known history of abdominal aortic aneurysm as well as carotid disease.  It appears she had an outside study showing a 4.5 cm aneurysm a little over a year ago.  She denies any aneurysm related symptoms.  Duplex today shows slight growth of the aneurysm now measuring 4.7 cm in maximal diameter.   She also has a reported history of peripheral arterial disease.  She really does not have any lifestyle limiting claudication, rest pain, or ulceration.  Her medical comorbidities limit her much more than any leg pain.  Noninvasive studies today show nearly normal ABIs with only mild disease with reasonably well-preserved flow. Her other issue that we evaluated today was her carotid artery disease.  She has had a known history of carotid artery disease.  She has had previous duplex study suggesting high-grade or at least moderate to high-grade left carotid stenosis previously.  She denies any focal neurologic symptoms. Specifically, the patient denies amaurosis fugax, speech or swallowing difficulties, or arm or leg weakness or numbness. Duplex today shows 40 to 59% right ICA stenosis and 80 to 99% left ICA stenosis.  She has now had 3 duplex studies that have suggested high-grade lesions in the past couple of years.  The previous CT angiogram had suggested a lesser degree of stenosis in [place], but the radiologist typically underrepresent the degree of stenosis on CT scan so I would not put much credence into this.   Past Medical History:  Diagnosis Date   3-vessel CAD    05/2018 LHC with a single  remaining conduit which is the left main coronary artery supplying the LAD.  Both the circumflex and right coronary artery were occluded.  The distal left main, proximal LAD is aneurysmal and heavily calcified.  The LAD supplies well-formed collaterals to the PDA.  Not felt to be candidate for revascularization and with recommendation for medical mgmt   AAA (abdominal aortic aneurysm)    4.3cm (12/2018)   Celiac artery stenosis (HCC)    Degenerative joint disease of left hip    Heart failure with preserved ejection fraction ([place])    EF 60-65%, 05/2018   History of non-ST elevation myocardial infarction (NSTEMI)    05/2018 with subsequent LHC and in the setting of acute aortic ulcer and hematoma   Hyperlipidemia    Hypertension    Internal carotid artery stenosis, left    80-99% 05/2018   Intramural hematoma of thoracic aorta    05/2018   Mitral regurgitation    Mild by 05/2018 echo   Paroxysmal SVT (supraventricular tachycardia) ([place])    05/2018   Peripheral vascular disease ([place])    extensive vascular dz s/p b/l carotid endarterectomies (2014) and iliac stenting, AAA, aortic ulcer   Right renal artery stenosis ([place])    05/2018   Smoking hx    Quit ~1992   Status post bilateral carotid endarterectomy    Followed by Vascular surgery with most recent images 05/2018 showing L ICA 80-99% stenosis  Subclavian artery stenosis, left ([place])    05/2018    Past Surgical History:  Procedure Laterality Date   APPENDECTOMY     CARDIAC CATHETERIZATION     HIP SURGERY Left    x3   LEFT HEART CATH AND CORONARY ANGIOGRAPHY N/A 05/19/2018   Procedure: LEFT HEART CATH AND CORONARY ANGIOGRAPHY;  Surgeon: Belva Crome, MD;  Location: [facility];  Service: Cardiovascular;  Laterality: N/A;   PERIPHERAL VASCULAR CATHETERIZATION Right 02/02/2016   Procedure: Lower Extremity Angiography;  Surgeon: Algernon Huxley, MD;  Location: [facility];  Service: Cardiovascular;  Laterality: Right;      Family History  Problem Relation Age of Onset   Hypertension Mother    Stroke Mother    Hypertension Father    Stroke Father    Hypertension Sister    Hyperlipidemia Sister    Hodgkin's lymphoma Son    Heart disease Son    Kidney disease Son    Mental illness Neg Hx       Social History   Tobacco Use   Smoking status: Former    Types: Cigarettes    Quit date: 02/02/1999    Years since quitting: 22.5   Smokeless tobacco: Never  Vaping Use   Vaping Use: Never used  Substance Use Topics   Alcohol use: No   Drug use: No     Allergies  Allergen Reactions   Carvedilol Other (See Comments)    intolerance   Codeine Other (See Comments)    Reaction: Unsure   Hydralazine Other (See Comments)    intolerance   Sulfa Antibiotics Other (See Comments)    Mouth blisters    Current Outpatient Medications  Medication Sig Dispense Refill   amLODipine (NORVASC) 10 MG tablet TAKE 1 TABLET BY MOUTH EVERY DAY 90 tablet 3   aspirin EC 81 MG tablet Take 1 tablet (81 mg total) by mouth daily. Swallow whole. 90 tablet 3   busPIRone (BUSPAR) 5 MG tablet TAKE 1-2 TABLETS (5-10 MG TOTAL) BY MOUTH 2 (TWO) TIMES DAILY AS NEEDED (ANXIETY/NERVES/PANIC). 180 tablet 1   cholecalciferol (VITAMIN D) 1000 units tablet Take 1,000 Units by mouth daily.     citalopram (CELEXA) 20 MG tablet Take 1 tablet (20 mg total) by mouth at bedtime. 90 tablet 3   clopidogrel (PLAVIX) 75 MG tablet TAKE 1 TABLET BY MOUTH EVERY DAY 90 tablet 3   Cyanocobalamin (VITAMIN B-12 PO) Take 1 tablet by mouth daily.     furosemide (LASIX) 20 MG tablet Take 1 tablet (20 mg total) by mouth daily. 30 tablet 3   glucosamine-chondroitin 500-400 MG tablet Take 1 tablet by mouth daily.     HYDROcodone-acetaminophen (NORCO) 10-325 MG tablet Take 1 tablet by mouth every 4 (four) hours as needed for severe pain. Must last 30 days. 180 tablet 0   icosapent Ethyl (VASCEPA) 1 g capsule Take 2 capsules (2 g total) by mouth 2 (two)  times daily. 120 capsule 3   isosorbide mononitrate (IMDUR) 30 MG 24 hr tablet TAKE 1/2 OF A TABLET (15 MG TOTAL) BY MOUTH DAILY 45 tablet 3   lisinopril (ZESTRIL) 40 MG tablet TAKE 1 TABLET BY MOUTH EVERY DAY 90 tablet 3   Multiple Vitamin (MULTIVITAMIN) tablet Take 1 tablet by mouth daily.     potassium citrate (UROCIT-K) 10 MEQ (1080 MG) SR tablet TAKE 1 TABLET BY MOUTH 2 TIMES DAILY. 60 tablet 5   rosuvastatin (CRESTOR) 40 MG tablet Take 1  tablet (40 mg total) by mouth daily. 90 tablet 3   HYDROcodone-acetaminophen (NORCO) 10-325 MG tablet Take 1 tablet by mouth every 4 (four) hours as needed for severe pain. Must last 30 days. 180 tablet 0   HYDROcodone-acetaminophen (NORCO) 10-325 MG tablet Take 1 tablet by mouth every 4 (four) hours as needed for severe pain. Must last 30 days. 180 tablet 0   metoprolol tartrate (LOPRESSOR) 100 MG tablet Take 1 tablet (100 mg total) by mouth 3 (three) times daily. 270 tablet 1   nitroGLYCERIN (NITROSTAT) 0.4 MG SL tablet Place 1 tablet (0.4 mg total) under the tongue every 5 (five) minutes as needed for chest pain. 25 tablet 3   No current facility-administered medications for this visit.      REVIEW OF SYSTEMS (Negative unless checked)  Constitutional: [] Weight loss  [] Fever  [] Chills Cardiac: [] Chest pain   [] Chest pressure   [] Palpitations   [] Shortness of breath when laying flat   [x] Shortness of breath at rest   [x] Shortness of breath with exertion. 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   [] Asthma Neurologic:  [x] Dizziness  [] Blackouts   [] Seizures   [] History of stroke   [] History of TIA  [] Aphasia    [] Temporary blindness   [] Dysphagia   [] Weakness or numbness in arms   [] Weakness or numbness in legs Musculoskeletal:  [x] Arthritis   [] Joint swelling   [x] Joint pain   [] Low back pain Hematologic:  [] Easy bruising  [] Easy bleeding   [] Hypercoagulable state   [] Anemic  [] Hepatitis Gastrointestinal:  [] Blood in stool   [] Vomiting blood  [x] Gastroesophageal reflux/heartburn   [] Abdominal pain Genitourinary:  [x] Chronic kidney disease   [] Difficult urination  [] Frequent urination  [] Burning with urination   [] Hematuria Skin:  [] Rashes   [] Ulcers   [] Wounds Psychological:  [] History of anxiety   []  History of major depression.    Physical Exam BP (!) 178/81 (BP Location: Right Arm)    Pulse 64    Resp 16    Wt 142 lb 6.4 oz (64.6 kg)    BMI 26.91 kg/m  Gen:  WD/WN, NAD. Appears older than stated age. Head: [place]/AT, No temporalis wasting. Ear/Nose/Throat: Hearing grossly intact, nares w/o erythema or drainage, oropharynx w/o Erythema/Exudate Eyes: Conjunctiva clear, sclera non-icteric  Neck: trachea midline.  No JVD. Bilateral bruits Pulmonary:  Good air movement, respirations not labored, no use of accessory muscles  Cardiac: irregular Vascular:  Vessel Right Left  Radial Palpable Palpable                          DP 1+ 1+  PT 1+ 1+   Gastrointestinal:. No masses, surgical incisions, or scars. Musculoskeletal: M/S 5/5 throughout.  Extremities without ischemic changes.  No deformity or atrophy. Trace LE edema. Neurologic: Sensation grossly intact in extremities.  Symmetrical.  Speech is fluent. Motor exam as listed above. Psychiatric: Judgment intact, Mood & affect appropriate for pt's clinical situation. Dermatologic: No rashes or ulcers noted.  No cellulitis or open wounds.  Radiology No results found.  Labs Recent Results (from the past 2160 hour(s))  CBC w/Diff/Platelet     Status: Abnormal   Collection Time: 08/16/21 10:01 AM  Result Value Ref Range   WBC 6.4 3.8 -  10.8 Thousand/uL   RBC 4.28 3.80 - 5.10 Million/uL   Hemoglobin 14.6 11.7 - 15.5 g/dL   HCT 42.9 35.0 - 45.0 %   MCV 100.2 (H) 80.0 - 100.0 fL   MCH 34.1 (H) 27.0 - 33.0 pg   MCHC 34.0 32.0 - 36.0 g/dL   RDW 12.3 11.0 - 15.0 %   Platelets 221 140 - 400 Thousand/uL   MPV 11.1 7.5 - 12.5 fL   Neutro Abs 3,699 1,500 - 7,800 cells/uL   Lymphs Abs 1,888 850 - 3,900 cells/uL   Absolute Monocytes 602 200 - 950 cells/uL   Eosinophils Absolute 198 15 - 500 cells/uL   Basophils Absolute 13 0 - 200 cells/uL   Neutrophils Relative % 57.8 %   Total Lymphocyte 29.5 %   Monocytes Relative 9.4 %   Eosinophils Relative 3.1 %   Basophils Relative 0.2 %  COMPLETE METABOLIC PANEL WITH GFR     Status: Abnormal   Collection Time: 08/16/21 10:01 AM  Result Value Ref Range   Glucose, Bld 102 (H) 65 - 99 mg/dL    Comment: .            Fasting reference interval . For someone without known diabetes, a glucose value between 100 and 125 mg/dL is consistent with prediabetes and should be confirmed with a follow-up test. .    BUN 31 (H) 7 - 25 mg/dL   Creat 1.10 (H) 0.60 - 1.00 mg/dL   eGFR 51 (L) > OR = 60 mL/min/1.64m    Comment: The eGFR is based on the CKD-EPI 2021 equation. To calculate  the new eGFR from a previous Creatinine or Cystatin C result, go to https://www.kidney.org/professionals/ kdoqi/gfr%5Fcalculator    BUN/Creatinine Ratio 28 (H) 6 - 22 (calc)   Sodium 140 135 - 146 mmol/L   Potassium 4.7 3.5 - 5.3 mmol/L   Chloride 103 98 - 110 mmol/L   CO2 32 20 - 32 mmol/L   Calcium 9.6 8.6 - 10.4 mg/dL   Total Protein 7.3 6.1 - 8.1 g/dL   Albumin 4.3 3.6 - 5.1 g/dL   Globulin 3.0 1.9 - 3.7 g/dL (calc)   AG Ratio 1.4 1.0 - 2.5 (calc)   Total Bilirubin 0.4 0.2 - 1.2 mg/dL   Alkaline phosphatase (APISO) 78 37 - 153 U/L   AST 21 10 - 35 U/L   ALT 13 6 - 29 U/L  Lipid Profile     Status: Abnormal   Collection Time: 08/16/21 10:01 AM  Result Value Ref Range   Cholesterol 138 <200 mg/dL    HDL 39 (L) > OR = 50 mg/dL   Triglycerides 256 (H) <150 mg/dL    Comment: . If a non-fasting specimen was collected, consider repeat triglyceride testing on a fasting specimen if clinically indicated.  JYates Decampet al. J. of Clin. Lipidol. 28016;5:537-482 .[name]   LDL Cholesterol (Calc) 69 mg/dL (calc)    Comment: Reference range: <100 . Desirable range <100 mg/dL for primary prevention;   <70 mg/dL for patients with CHD or diabetic patients  with > or = 2 CHD risk factors. .Marland KitchenLDL-C is now calculated using the Martin-Hopkins  calculation, which is a validated novel method providing  better accuracy than the Friedewald equation in  the  estimation of LDL-C.  Cresenciano Genre et al. Annamaria Helling. 2956;213(08): 2061-2068  (http://education.[facility].com/faq/FAQ164)    Total CHOL/HDL Ratio 3.5 <5.0 (calc)   Non-HDL Cholesterol (Calc) 99 <130 mg/dL (calc)    Comment: For patients with diabetes plus 1 major ASCVD risk  factor, treating to a non-HDL-C goal of <100 mg/dL  (LDL-C of <70 mg/dL) is considered a therapeutic  option.   Hepatitis C Antibody     Status: None   Collection Time: 08/16/21 10:01 AM  Result Value Ref Range   Hepatitis C Ab NON-REACTIVE NON-REACTIVE   SIGNAL TO CUT-OFF 0.08 <1.00    Comment: . HCV antibody was non-reactive. There is no laboratory  evidence of HCV infection. . In most cases, no further action is required. However, if recent HCV exposure is suspected, a test for HCV RNA (test code [phone]) is suggested. . For additional information please refer to http://education.[facility].com/faq/FAQ22v1 (This link is being provided for informational/ educational purposes only.) .     Assessment/Plan:  AAA (abdominal aortic aneurysm) (HCC) Duplex today shows slight growth of the aneurysm now measuring 4.7 cm in maximal diameter.  Below the threshold for repair particularly with her extensive medical comorbidities.  Would wait till this was at least 5 cm to  repair.  Recheck in 6 months.  Essential hypertension blood pressure control important in reducing the progression of atherosclerotic disease and AAA growth. On appropriate oral medications.   CKD (chronic kidney disease) stage 3, GFR 30-59 ml/min (HCC) Limit contrast use and hydrate with any use of contrast.  Hyperlipidemia lipid control important in reducing the progression of atherosclerotic disease. Continue statin therapy   PVD (peripheral vascular disease) ([place]) Lower extremity ABIs were checked today and were near normal and only mildly reduced.  No disabling claudication.  Her medical comorbidities limit her walking much more than any mild peripheral arterial disease.  Recheck in 1 year.  Carotid artery stenosis Duplex today shows 40 to 59% right ICA stenosis and 80 to 99% left ICA stenosis.  She has now had 3 duplex studies that have suggested high-grade lesions in the past couple of years.  The previous CT angiogram had suggested a lesser degree of stenosis in [place], but the radiologist typically underrepresent the degree of stenosis on CT scan so I would not put much credence into this.  At this point, with her medical comorbidities general anesthesia for carotid endarterectomy would be suboptimal.  I discussed the role of carotid angiogram with expected stenting.  She does not want to have this done and says that she understands her stroke risk is significantly higher with medical management alone but is not interested in having any procedures.  I will plan to see her back with her follow-up studies for her aneurysm and her carotid in 6 months, but if she reconsiders I would be happy to offer her carotid angiogram and possible stenting.      Leotis Pain 08/30/2021, 1:20 PM   This note was created with Dragon medical transcription system.  Any errors from dictation are unintentional.

## 2021-08-30 DIAGNOSIS — E785 Hyperlipidemia, unspecified: Secondary | ICD-10-CM | POA: Insufficient documentation

## 2021-08-30 DIAGNOSIS — I6529 Occlusion and stenosis of unspecified carotid artery: Secondary | ICD-10-CM | POA: Insufficient documentation

## 2021-08-30 NOTE — Assessment & Plan Note (Signed)
lipid control important in reducing the progression of atherosclerotic disease. Continue statin therapy  

## 2021-08-30 NOTE — Assessment & Plan Note (Signed)
Duplex today shows slight growth of the aneurysm now measuring 4.7 cm in maximal diameter.  Below the threshold for repair particularly with her extensive medical comorbidities.  Would wait till this was at least 5 cm to repair.  Recheck in 6 months.

## 2021-08-30 NOTE — Assessment & Plan Note (Signed)
Lower extremity ABIs were checked today and were near normal and only mildly reduced.  No disabling claudication.  Her medical comorbidities limit her walking much more than any mild peripheral arterial disease.  Recheck in 1 year.

## 2021-08-30 NOTE — Assessment & Plan Note (Signed)
Duplex today shows 40 to 59% right ICA stenosis and 80 to 99% left ICA stenosis.  She has now had 3 duplex studies that have suggested high-grade lesions in the past couple of years.  The previous CT angiogram had suggested a lesser degree of stenosis in [place], but the radiologist typically underrepresent the degree of stenosis on CT scan so I would not put much credence into this.  At this point, with her medical comorbidities general anesthesia for carotid endarterectomy would be suboptimal.  I discussed the role of carotid angiogram with expected stenting.  She does not want to have this done and says that she understands her stroke risk is significantly higher with medical management alone but is not interested in having any procedures.  I will plan to see her back with her follow-up studies for her aneurysm and her carotid in 6 months, but if she reconsiders I would be happy to offer her carotid angiogram and possible stenting.

## 2021-08-30 NOTE — Assessment & Plan Note (Signed)
Limit contrast use and hydrate with any use of contrast.

## 2021-08-30 NOTE — Assessment & Plan Note (Addendum)
blood pressure control important in reducing the progression of atherosclerotic disease and AAA growth. On appropriate oral medications.  

## 2021-09-11 ENCOUNTER — Telehealth: Payer: Self-pay

## 2021-09-11 NOTE — Telephone Encounter (Signed)
Copied from CRM [id number]. Topic: General - Other >> Sep 11, 2021  2:11 PM McGill, Darlina Rumpf wrote: Reason for CRM: Pt stated her handicapped sticker is due for renewal. Pt asked if she could stop by the office to drop off forms, have them filled out, and come pick them up at another time.  Pt requesting a call back.

## 2021-09-11 NOTE — Telephone Encounter (Signed)
Can you do this

## 2021-09-12 ENCOUNTER — Other Ambulatory Visit: Payer: Self-pay

## 2021-09-12 ENCOUNTER — Encounter: Payer: Self-pay | Admitting: Student in an Organized Health Care Education/Training Program

## 2021-09-12 ENCOUNTER — Ambulatory Visit
Payer: Medicare Other | Attending: Student in an Organized Health Care Education/Training Program | Admitting: Student in an Organized Health Care Education/Training Program

## 2021-09-12 VITALS — BP 135/62 | HR 56 | Temp 97.3°F | Resp 16 | Ht 61.0 in | Wt 141.0 lb

## 2021-09-12 DIAGNOSIS — G8921 Chronic pain due to trauma: Secondary | ICD-10-CM

## 2021-09-12 DIAGNOSIS — Z79891 Long term (current) use of opiate analgesic: Secondary | ICD-10-CM

## 2021-09-12 DIAGNOSIS — G8929 Other chronic pain: Secondary | ICD-10-CM | POA: Diagnosis present

## 2021-09-12 DIAGNOSIS — M25552 Pain in left hip: Secondary | ICD-10-CM | POA: Diagnosis present

## 2021-09-12 DIAGNOSIS — M1652 Unilateral post-traumatic osteoarthritis, left hip: Secondary | ICD-10-CM

## 2021-09-12 DIAGNOSIS — Z9889 Other specified postprocedural states: Secondary | ICD-10-CM | POA: Diagnosis not present

## 2021-09-12 DIAGNOSIS — M5442 Lumbago with sciatica, left side: Secondary | ICD-10-CM | POA: Insufficient documentation

## 2021-09-12 DIAGNOSIS — I25118 Atherosclerotic heart disease of native coronary artery with other forms of angina pectoris: Secondary | ICD-10-CM

## 2021-09-12 DIAGNOSIS — I739 Peripheral vascular disease, unspecified: Secondary | ICD-10-CM | POA: Diagnosis not present

## 2021-09-12 MED ORDER — HYDROCODONE-ACETAMINOPHEN 10-325 MG PO TABS: 1.0000 | ORAL_TABLET | ORAL | 0 refills | Status: DC | PRN

## 2021-09-12 NOTE — Progress Notes (Signed)
Safety precautions to be maintained throughout the outpatient stay will include: orient to surroundings, keep bed in low position, maintain call bell within reach at all times, provide assistance with transfer out of bed and ambulation.   Nursing Pain Medication Assessment:  Safety precautions to be maintained throughout the outpatient stay will include: orient to surroundings, keep bed in low position, maintain call bell within reach at all times, provide assistance with transfer out of bed and ambulation.  Medication Inspection Compliance: Pill count conducted under aseptic conditions, in front of the patient. Neither the pills nor the bottle was removed from the patient's sight at any time. Once count was completed pills were immediately returned to the patient in their original bottle.  Medication: Hydrocodone/APAP Pill/Patch Count:  60 of 180 pills remain Pill/Patch Appearance: Markings consistent with prescribed medication Bottle Appearance: Standard pharmacy container. Clearly labeled. Filled Date: 01 / 17 / 2023 Last Medication intake:  Today

## 2021-09-12 NOTE — Progress Notes (Signed)
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 closed on scheduled refill date.   HYDROcodone-acetaminophen (NORCO) 10-325 MG tablet    Sig: Take 1 tablet by mouth every 4 (four) hours as needed for severe pain. Must last 30 days.    Dispense:  180 tablet    Refill:  0    Chronic Pain. (STOP Act - Not applicable). Fill one day early if closed on scheduled refill date.   HYDROcodone-acetaminophen (NORCO) 10-325 MG tablet    Sig: Take 1 tablet by mouth every 4 (four) hours as needed for severe pain. Must last 30 days.    Dispense:  180 tablet    Refill:  0    Chronic Pain. (STOP Act - Not applicable). Fill one day  early if closed on scheduled refill date.   Follow-up plan:   Return in about 3 months (around 12/14/2021) for Medication Management, in person.    Recent Visits No visits were found meeting these conditions. Showing recent visits within past 90 days and meeting all other requirements Today's Visits Date  Type Provider Dept  09/12/21 Office Visit Gillis Santa, MD [facility]  Showing today's visits and meeting all other requirements Future Appointments Date Type Provider Dept  12/07/21 Appointment Gillis Santa, MD [facility]  Showing future appointments within next 90 days and meeting all other requirements  I discussed the assessment and treatment plan with the patient. The patient was provided an opportunity to ask questions and all were answered. The patient agreed with the plan and demonstrated an understanding of the instructions.  Patient advised to call back or seek an in-person evaluation if the symptoms or condition worsens.  Duration of encounter: 36mnutes.  Note by: BGillis Santa MD Date: 09/12/2021; Time: 11:18 AM

## 2021-09-18 ENCOUNTER — Ambulatory Visit (INDEPENDENT_AMBULATORY_CARE_PROVIDER_SITE_OTHER): Payer: Medicare Other | Admitting: Internal Medicine

## 2021-09-18 ENCOUNTER — Encounter: Payer: Self-pay | Admitting: Internal Medicine

## 2021-09-18 ENCOUNTER — Other Ambulatory Visit: Payer: Self-pay

## 2021-09-18 VITALS — BP 138/68 | HR 87 | Temp 98.8°F | Resp 16 | Ht 61.0 in | Wt 143.6 lb

## 2021-09-18 DIAGNOSIS — L03211 Cellulitis of face: Secondary | ICD-10-CM | POA: Diagnosis not present

## 2021-09-18 MED ORDER — CEFTRIAXONE SODIUM 500 MG IJ SOLR
500.0000 mg | Freq: Once | INTRAMUSCULAR | Status: AC
  Administered 2021-09-18: 500 mg via INTRAMUSCULAR

## 2021-09-18 MED ORDER — LIDOCAINE HCL (PF) 1 % IJ SOLN
2.0000 mL | Freq: Once | INTRAMUSCULAR | Status: AC
  Administered 2021-09-18: 2 mL via INTRADERMAL

## 2021-09-18 MED ORDER — DOXYCYCLINE HYCLATE 100 MG PO TABS: 100.0000 mg | ORAL_TABLET | Freq: Two times a day (BID) | ORAL | 0 refills | Status: DC

## 2021-09-18 NOTE — Patient Instructions (Addendum)
It was great seeing you today!  Plan discussed at today's visit: -Antibiotic sent to pharmacy to take for 1 week -Please go to the ER if you develop fevers, extreme pain or blistering/peeling like a sunburn   Follow up in: 1 week   Take care and let us know if you have any questions or concerns prior to your next visit.  Dr. Caralee Ates

## 2021-09-18 NOTE — Progress Notes (Signed)
Acute Office Visit  Subjective:    Patient ID: Elizabeth [name], female    DOB: 09-23-1940, 81 y.o.   MRN: [id number]  Chief Complaint  Patient presents with   sore nose    Swollen red, scab w/ runny nose    HPI Patient is in today for sore on nose. States she's been having clear rhinorrhea for the last few days. Woke up on Saturday and was wiping her nose extensively and got a scratch on the side of her nose. This is now a scab. Over the weekend she's had increased redness, swelling and pain on the right side of her face. Denies fever/chills or other URI symptoms. No new medications other than fish oil. Has been using a Dove bar soap unscented to wash her face but not using anything else on her face. Did try Neosporin on the scratch initially, denies drainage. Allergy to sulfa.    Past Medical History:  Diagnosis Date   3-vessel CAD    05/2018 LHC with a single remaining conduit which is the left main coronary artery supplying the LAD.  Both the circumflex and right coronary artery were occluded.  The distal left main, proximal LAD is aneurysmal and heavily calcified.  The LAD supplies well-formed collaterals to the PDA.  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)    EF 60-65%, 05/2018   History of non-ST elevation myocardial infarction (NSTEMI)    05/2018 with subsequent LHC and in the setting of acute aortic ulcer and hematoma   Hyperlipidemia    Hypertension    Internal carotid artery stenosis, left    80-99% 05/2018   Intramural hematoma of thoracic aorta    05/2018   Mitral regurgitation    Mild by 05/2018 echo   Paroxysmal SVT (supraventricular tachycardia) ([place])    05/2018   Peripheral vascular disease ([place])    extensive vascular dz s/p b/l  carotid endarterectomies (2014) and iliac stenting, AAA, aortic ulcer   Right renal artery stenosis ([place])    05/2018   Smoking hx    Quit ~1992   Status post bilateral carotid endarterectomy    Followed by Vascular surgery with most recent images 05/2018 showing L ICA 80-99% stenosis   Subclavian artery stenosis, left ([place])    05/2018    Past Surgical History:  Procedure Laterality Date   APPENDECTOMY     CARDIAC CATHETERIZATION     HIP SURGERY Left    x3   LEFT HEART CATH AND CORONARY ANGIOGRAPHY N/A 05/19/2018   Procedure: LEFT HEART CATH AND CORONARY ANGIOGRAPHY;  Surgeon: Belva Crome, MD;  Location: [facility];  Service: Cardiovascular;  Laterality: N/A;   PERIPHERAL VASCULAR CATHETERIZATION Right 02/02/2016   Procedure: Lower Extremity Angiography;  Surgeon: Algernon Huxley, MD;  Location: [facility];  Service: Cardiovascular;  Laterality: Right;    Family History  Problem Relation Age of Onset   Hypertension Mother    Stroke Mother    Hypertension Father    Stroke Father    Hypertension Sister    Hyperlipidemia Sister    Hodgkin's lymphoma Son    Heart disease Son    Kidney disease Son    Mental illness Neg Hx  Social History   Socioeconomic History   Marital status: Widowed    Spouse name: Not on file   Number of children: 2   Years of education: Not on file   Highest education level: GED or equivalent  Occupational History   Occupation: diabled  Tobacco Use   Smoking status: Former    Types: Cigarettes    Quit date: 02/02/1999    Years since quitting: 22.6   Smokeless tobacco: Never  Vaping Use   Vaping Use: Never used  Substance and Sexual Activity   Alcohol use: No   Drug use: No   Sexual activity: Not Currently  Other Topics Concern   Not on file  Social History Narrative   Pt lives alone, does not drive but sister drives her when needed.    Social Determinants of Health   Financial Resource Strain: Low Risk    Difficulty  of Paying Living Expenses: Not hard at all  Food Insecurity: No Food Insecurity   Worried About Charity fundraiser in the Last Year: Never true   [place] in the Last Year: Never true  Transportation Needs: No Transportation Needs   Lack of Transportation (Medical): No   Lack of Transportation (Non-Medical): No  Physical Activity: Inactive   Days of Exercise per Week: 0 days   Minutes of Exercise per Session: 0 min  Stress: No Stress Concern Present   Feeling of Stress : Only a little  Social Connections: Moderately Isolated   Frequency of Communication with Friends and Family: More than three times a week   Frequency of Social Gatherings with Friends and Family: More than three times a week   Attends Religious Services: More than 4 times per year   Active Member of [employer] or Organizations: No   Attends Archivist Meetings: Never   Marital Status: Widowed  Human resources officer Violence: Not At Risk   Fear of Current or Ex-Partner: No   Emotionally Abused: No   Physically Abused: No   Sexually Abused: No    Outpatient Medications Prior to Visit  Medication Sig Dispense Refill   amLODipine (NORVASC) 10 MG tablet TAKE 1 TABLET BY MOUTH EVERY DAY 90 tablet 3   aspirin EC 81 MG tablet Take 1 tablet (81 mg total) by mouth daily. Swallow whole. 90 tablet 3   busPIRone (BUSPAR) 5 MG tablet TAKE 1-2 TABLETS (5-10 MG TOTAL) BY MOUTH 2 (TWO) TIMES DAILY AS NEEDED (ANXIETY/NERVES/PANIC). 180 tablet 1   cholecalciferol (VITAMIN D) 1000 units tablet Take 1,000 Units by mouth daily.     citalopram (CELEXA) 20 MG tablet Take 1 tablet (20 mg total) by mouth at bedtime. 90 tablet 3   clopidogrel (PLAVIX) 75 MG tablet TAKE 1 TABLET BY MOUTH EVERY DAY 90 tablet 3   Cyanocobalamin (VITAMIN B-12 PO) Take 1 tablet by mouth daily.     furosemide (LASIX) 20 MG tablet Take 1 tablet (20 mg total) by mouth daily. 30 tablet 3   glucosamine-chondroitin 500-400 MG tablet Take 1 tablet by mouth  daily.     [START ON 09/21/2021] HYDROcodone-acetaminophen (NORCO) 10-325 MG tablet Take 1 tablet by mouth every 4 (four) hours as needed for severe pain. Must last 30 days. 180 tablet 0   [START ON 10/21/2021] HYDROcodone-acetaminophen (NORCO) 10-325 MG tablet Take 1 tablet by mouth every 4 (four) hours as needed for severe pain. Must last 30 days. 180 tablet 0   [START ON 11/20/2021] HYDROcodone-acetaminophen (NORCO) 10-325 MG tablet  Take 1 tablet by mouth every 4 (four) hours as needed for severe pain. Must last 30 days. 180 tablet 0   icosapent Ethyl (VASCEPA) 1 g capsule Take 2 capsules (2 g total) by mouth 2 (two) times daily. 120 capsule 3   isosorbide mononitrate (IMDUR) 30 MG 24 hr tablet TAKE 1/2 OF A TABLET (15 MG TOTAL) BY MOUTH DAILY 45 tablet 3   lisinopril (ZESTRIL) 40 MG tablet TAKE 1 TABLET BY MOUTH EVERY DAY 90 tablet 3   Multiple Vitamin (MULTIVITAMIN) tablet Take 1 tablet by mouth daily.     potassium citrate (UROCIT-K) 10 MEQ (1080 MG) SR tablet TAKE 1 TABLET BY MOUTH 2 TIMES DAILY. 60 tablet 5   rosuvastatin (CRESTOR) 40 MG tablet Take 1 tablet (40 mg total) by mouth daily. 90 tablet 3   HYDROcodone-acetaminophen (NORCO) 10-325 MG tablet Take 1 tablet by mouth every 4 (four) hours as needed for severe pain. Must last 30 days. 180 tablet 0   HYDROcodone-acetaminophen (NORCO) 10-325 MG tablet Take 1 tablet by mouth every 4 (four) hours as needed for severe pain. Must last 30 days. 180 tablet 0   metoprolol tartrate (LOPRESSOR) 100 MG tablet Take 1 tablet (100 mg total) by mouth 3 (three) times daily. 270 tablet 1   nitroGLYCERIN (NITROSTAT) 0.4 MG SL tablet Place 1 tablet (0.4 mg total) under the tongue every 5 (five) minutes as needed for chest pain. 25 tablet 3   No facility-administered medications prior to visit.    Allergies  Allergen Reactions   Carvedilol Other (See Comments)    intolerance   Codeine Other (See Comments)    Reaction: Unsure   Hydralazine Other (See  Comments)    intolerance   Sulfa Antibiotics Other (See Comments)    Mouth blisters    Review of Systems  Constitutional:  Negative for chills and fever.  HENT:  Positive for rhinorrhea. Negative for congestion.   Eyes:  Negative for visual disturbance.  Skin:  Positive for color change.      Objective:    Physical Exam Constitutional:      Appearance: Normal appearance.  HENT:     Head: Normocephalic and atraumatic.  Eyes:     Conjunctiva/sclera: Conjunctivae normal.  Cardiovascular:     Rate and Rhythm: Normal rate and regular rhythm.  Pulmonary:     Effort: Pulmonary effort is normal.     Breath sounds: Normal breath sounds.  Skin:    General: Skin is warm and dry.     Comments: Erythema with swelling on right side of face, black scab on right side of nose, no drainage. See attached image.   Neurological:     General: No focal deficit present.     Mental Status: She is alert. Mental status is at baseline.  Psychiatric:        Mood and Affect: Mood normal.        Behavior: Behavior normal.      Media Information Document Information  Photos  Facial cellulitis 09/18/21  09/18/2021 13:58  Attached To:  Office Visit on 09/18/21 with Teodora Medici, DO   Source Information  Teodora Medici, DO   [facility]    Pulse 87    Temp 98.8 F (37.1 C)    Resp 16    Ht 5' 1"  (1.549 m)    Wt 143 lb 9.6 oz (65.1 kg)    SpO2 94%    BMI 27.13 kg/m  Wt Readings from Last  3 Encounters:  09/18/21 143 lb 9.6 oz (65.1 kg)  09/12/21 141 lb (64 kg)  08/29/21 142 lb 6.4 oz (64.6 kg)    Health Maintenance Due  Topic Date Due   TETANUS/TDAP  Never done   Zoster Vaccines- Shingrix (1 of 2) Never done   DEXA SCAN  Never done    There are no preventive care reminders to display for this patient.   Lab Results  Component Value Date   TSH 1.43 08/04/2020   Lab Results  Component Value Date   WBC 6.4 08/16/2021   HGB 14.6 08/16/2021   HCT 42.9  08/16/2021   MCV 100.2 (H) 08/16/2021   PLT 221 08/16/2021   Lab Results  Component Value Date   NA 140 08/16/2021   K 4.7 08/16/2021   CO2 32 08/16/2021   GLUCOSE 102 (H) 08/16/2021   BUN 31 (H) 08/16/2021   CREATININE 1.10 (H) 08/16/2021   BILITOT 0.4 08/16/2021   ALKPHOS 62 05/18/2018   AST 21 08/16/2021   ALT 13 08/16/2021   PROT 7.3 08/16/2021   ALBUMIN 2.8 (L) 05/21/2018   CALCIUM 9.6 08/16/2021   ANIONGAP 10 03/13/2020   EGFR 51 (L) 08/16/2021   Lab Results  Component Value Date   CHOL 138 08/16/2021   Lab Results  Component Value Date   HDL 39 (L) 08/16/2021   Lab Results  Component Value Date   LDLCALC 69 08/16/2021   Lab Results  Component Value Date   TRIG 256 (H) 08/16/2021   Lab Results  Component Value Date   CHOLHDL 3.5 08/16/2021   Lab Results  Component Value Date   HGBA1C 5 12/16/2020       Assessment & Plan:   1. Facial cellulitis: Concern for spreading, discussed having her go to the ER but she would rather be treated outpatient if possible. Gave Rocephin 1 g here and will start Doxycycline tonight. Follow up in tomorrow for recheck, if still getting worse she will be sent to the ER.   - doxycycline (VIBRA-TABS) 100 MG tablet; Take 1 tablet (100 mg total) by mouth 2 (two) times daily for 7 days.  Dispense: 14 tablet; Refill: 0 - cefTRIAXone (ROCEPHIN) injection 500 mg - cefTRIAXone (ROCEPHIN) injection 500 mg - lidocaine (PF) (XYLOCAINE) 1 % injection 2 mL   Teodora Medici, DO

## 2021-09-19 ENCOUNTER — Ambulatory Visit (INDEPENDENT_AMBULATORY_CARE_PROVIDER_SITE_OTHER): Payer: Medicare Other | Admitting: Internal Medicine

## 2021-09-19 ENCOUNTER — Encounter: Payer: Self-pay | Admitting: Internal Medicine

## 2021-09-19 VITALS — BP 138/82 | HR 72 | Temp 97.6°F | Resp 16 | Ht 61.0 in | Wt 143.0 lb

## 2021-09-19 DIAGNOSIS — F419 Anxiety disorder, unspecified: Secondary | ICD-10-CM

## 2021-09-19 DIAGNOSIS — L03211 Cellulitis of face: Secondary | ICD-10-CM | POA: Diagnosis not present

## 2021-09-19 MED ORDER — AMOXICILLIN-POT CLAVULANATE 875-125 MG PO TABS: 1.0000 | ORAL_TABLET | Freq: Two times a day (BID) | ORAL | 0 refills | Status: DC

## 2021-09-19 MED ORDER — BUSPIRONE HCL 5 MG PO TABS: 5.0000 mg | ORAL_TABLET | Freq: Two times a day (BID) | ORAL | 1 refills | Status: DC | PRN

## 2021-09-19 NOTE — Patient Instructions (Addendum)
It was great seeing you today!  Plan discussed at today's visit: -Continue Doxycyline twice a day for a total of 7 days  -New antibiotic (Augmentin) twice a day for total of 10 days -If you develop fever/chills or if the rash spreads or gets worse, please go to the ER   Follow up in: 1 week   Take care and let us know if you have any questions or concerns prior to your next visit.  Dr. Caralee Ates

## 2021-09-19 NOTE — Progress Notes (Signed)
Acute Office Visit  Subjective:    Patient ID: Elizabeth [name], female    DOB: August 08, 1940, 81 y.o.   MRN: [id number]  Chief Complaint  Patient presents with   Cellulitis    Of face    HPI Patient is in today for follow up on facial cellulitis. Was seen in the office yesterday, treated with Ceftriaxone IM 1 g and started on Doxycycline. Recheck today. States the redness, swelling and pain has decreased. She did start the antibiotic yesterday and is tolerating it well. Denies fevers, chills or other associated symptoms. No vision changes or pain with EOM.   Past Medical History:  Diagnosis Date   3-vessel CAD    05/2018 LHC with a single remaining conduit which is the left main coronary artery supplying the LAD.  Both the circumflex and right coronary artery were occluded.  The distal left main, proximal LAD is aneurysmal and heavily calcified.  The LAD supplies well-formed collaterals to the PDA.  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)    EF 60-65%, 05/2018   History of non-ST elevation myocardial infarction (NSTEMI)    05/2018 with subsequent LHC and in the setting of acute aortic ulcer and hematoma   Hyperlipidemia    Hypertension    Internal carotid artery stenosis, left    80-99% 05/2018   Intramural hematoma of thoracic aorta    05/2018   Mitral regurgitation    Mild by 05/2018 echo   Paroxysmal SVT (supraventricular tachycardia) ([place])    05/2018   Peripheral vascular disease ([place])    extensive vascular dz s/p b/l carotid endarterectomies (2014) and iliac stenting, AAA, aortic ulcer   Right renal artery stenosis ([place])    05/2018   Smoking hx    Quit ~1992   Status post bilateral carotid endarterectomy    Followed by Vascular surgery with  most recent images 05/2018 showing L ICA 80-99% stenosis   Subclavian artery stenosis, left ([place])    05/2018    Past Surgical History:  Procedure Laterality Date   APPENDECTOMY     CARDIAC CATHETERIZATION     HIP SURGERY Left    x3   LEFT HEART CATH AND CORONARY ANGIOGRAPHY N/A 05/19/2018   Procedure: LEFT HEART CATH AND CORONARY ANGIOGRAPHY;  Surgeon: Belva Crome, MD;  Location: [facility];  Service: Cardiovascular;  Laterality: N/A;   PERIPHERAL VASCULAR CATHETERIZATION Right 02/02/2016   Procedure: Lower Extremity Angiography;  Surgeon: Algernon Huxley, MD;  Location: [facility];  Service: Cardiovascular;  Laterality: Right;    Family History  Problem Relation Age of Onset   Hypertension Mother    Stroke Mother    Hypertension Father    Stroke Father    Hypertension Sister    Hyperlipidemia Sister    Hodgkin's lymphoma Son    Heart disease Son    Kidney disease Son    Mental illness Neg Hx     Social History   Socioeconomic History   Marital status: Widowed    Spouse name: Not on file   Number of children: 2   Years of education: Not on file   Highest education level: GED or equivalent  Occupational History   Occupation: diabled  Tobacco  Use   Smoking status: Former    Types: Cigarettes    Quit date: 02/02/1999    Years since quitting: 22.6   Smokeless tobacco: Never  Vaping Use   Vaping Use: Never used  Substance and Sexual Activity   Alcohol use: No   Drug use: No   Sexual activity: Not Currently  Other Topics Concern   Not on file  Social History Narrative   Pt lives alone, does not drive but sister drives her when needed.    Social Determinants of Health   Financial Resource Strain: Low Risk    Difficulty of Paying Living Expenses: Not hard at all  Food Insecurity: No Food Insecurity   Worried About Charity fundraiser in the Last Year: Never true   [place] in the Last Year: Never true  Transportation Needs: No  Transportation Needs   Lack of Transportation (Medical): No   Lack of Transportation (Non-Medical): No  Physical Activity: Inactive   Days of Exercise per Week: 0 days   Minutes of Exercise per Session: 0 min  Stress: No Stress Concern Present   Feeling of Stress : Only a little  Social Connections: Moderately Isolated   Frequency of Communication with Friends and Family: More than three times a week   Frequency of Social Gatherings with Friends and Family: More than three times a week   Attends Religious Services: More than 4 times per year   Active Member of [employer] or Organizations: No   Attends Archivist Meetings: Never   Marital Status: Widowed  Human resources officer Violence: Not At Risk   Fear of Current or Ex-Partner: No   Emotionally Abused: No   Physically Abused: No   Sexually Abused: No    Outpatient Medications Prior to Visit  Medication Sig Dispense Refill   amLODipine (NORVASC) 10 MG tablet TAKE 1 TABLET BY MOUTH EVERY DAY 90 tablet 3   aspirin EC 81 MG tablet Take 1 tablet (81 mg total) by mouth daily. Swallow whole. 90 tablet 3   busPIRone (BUSPAR) 5 MG tablet TAKE 1-2 TABLETS (5-10 MG TOTAL) BY MOUTH 2 (TWO) TIMES DAILY AS NEEDED (ANXIETY/NERVES/PANIC). 180 tablet 1   cholecalciferol (VITAMIN D) 1000 units tablet Take 1,000 Units by mouth daily.     citalopram (CELEXA) 20 MG tablet Take 1 tablet (20 mg total) by mouth at bedtime. 90 tablet 3   clopidogrel (PLAVIX) 75 MG tablet TAKE 1 TABLET BY MOUTH EVERY DAY 90 tablet 3   Cyanocobalamin (VITAMIN B-12 PO) Take 1 tablet by mouth daily.     doxycycline (VIBRA-TABS) 100 MG tablet Take 1 tablet (100 mg total) by mouth 2 (two) times daily for 7 days. 14 tablet 0   furosemide (LASIX) 20 MG tablet Take 1 tablet (20 mg total) by mouth daily. 30 tablet 3   glucosamine-chondroitin 500-400 MG tablet Take 1 tablet by mouth daily.     HYDROcodone-acetaminophen (NORCO) 10-325 MG tablet Take 1 tablet by mouth every 4 (four)  hours as needed for severe pain. Must last 30 days. 180 tablet 0   HYDROcodone-acetaminophen (NORCO) 10-325 MG tablet Take 1 tablet by mouth every 4 (four) hours as needed for severe pain. Must last 30 days. 180 tablet 0   [START ON 09/21/2021] HYDROcodone-acetaminophen (NORCO) 10-325 MG tablet Take 1 tablet by mouth every 4 (four) hours as needed for severe pain. Must last 30 days. 180 tablet 0   [START ON 10/21/2021] HYDROcodone-acetaminophen (NORCO) 10-325 MG tablet  Take 1 tablet by mouth every 4 (four) hours as needed for severe pain. Must last 30 days. 180 tablet 0   [START ON 11/20/2021] HYDROcodone-acetaminophen (NORCO) 10-325 MG tablet Take 1 tablet by mouth every 4 (four) hours as needed for severe pain. Must last 30 days. 180 tablet 0   icosapent Ethyl (VASCEPA) 1 g capsule Take 2 capsules (2 g total) by mouth 2 (two) times daily. 120 capsule 3   isosorbide mononitrate (IMDUR) 30 MG 24 hr tablet TAKE 1/2 OF A TABLET (15 MG TOTAL) BY MOUTH DAILY 45 tablet 3   lisinopril (ZESTRIL) 40 MG tablet TAKE 1 TABLET BY MOUTH EVERY DAY 90 tablet 3   metoprolol tartrate (LOPRESSOR) 100 MG tablet Take 1 tablet (100 mg total) by mouth 3 (three) times daily. 270 tablet 1   Multiple Vitamin (MULTIVITAMIN) tablet Take 1 tablet by mouth daily.     nitroGLYCERIN (NITROSTAT) 0.4 MG SL tablet Place 1 tablet (0.4 mg total) under the tongue every 5 (five) minutes as needed for chest pain. 25 tablet 3   potassium citrate (UROCIT-K) 10 MEQ (1080 MG) SR tablet TAKE 1 TABLET BY MOUTH 2 TIMES DAILY. 60 tablet 5   rosuvastatin (CRESTOR) 40 MG tablet Take 1 tablet (40 mg total) by mouth daily. 90 tablet 3   No facility-administered medications prior to visit.    Allergies  Allergen Reactions   Carvedilol Other (See Comments)    intolerance   Codeine Other (See Comments)    Reaction: Unsure   Hydralazine Other (See Comments)    intolerance   Sulfa Antibiotics Other (See Comments)    Mouth blisters    Review of  Systems  Constitutional:  Negative for chills and fever.  Gastrointestinal:  Negative for diarrhea and nausea.  Skin:  Positive for color change.      Objective:    Physical Exam Constitutional:      Appearance: Normal appearance.  HENT:     Head: Normocephalic and atraumatic.  Eyes:     Extraocular Movements: Extraocular movements intact.     Conjunctiva/sclera: Conjunctivae normal.     Pupils: Pupils are equal, round, and reactive to light.  Cardiovascular:     Rate and Rhythm: Normal rate and regular rhythm.  Pulmonary:     Effort: Pulmonary effort is normal.     Breath sounds: Normal breath sounds.  Skin:    General: Skin is warm and dry.     Comments: Erythema with swelling on right side of face reduced compared to yesterday.  Neurological:     General: No focal deficit present.     Mental Status: She is alert. Mental status is at baseline.  Psychiatric:        Mood and Affect: Mood normal.        Behavior: Behavior normal.    BP 138/82    Pulse 72    Temp 97.6 F (36.4 C)    Resp 16    Ht _0  (1.549 m)    Wt 143 lb (64.9 kg)    SpO2 94%    BMI 27.02 kg/m  Wt Readings from Last 3 Encounters:  09/18/21 143 lb 9.6 oz (65.1 kg)  09/12/21 141 lb (64 kg)  08/29/21 142 lb 6.4 oz (64.6 kg)    Health Maintenance Due  Topic Date Due   TETANUS/TDAP  Never done   Zoster Vaccines- Shingrix (1 of 2) Never done   DEXA SCAN  Never done    There are  no preventive care reminders to display for this patient.   Lab Results  Component Value Date   TSH 1.43 08/04/2020   Lab Results  Component Value Date   WBC 6.4 08/16/2021   HGB 14.6 08/16/2021   HCT 42.9 08/16/2021   MCV 100.2 (H) 08/16/2021   PLT 221 08/16/2021   Lab Results  Component Value Date   NA 140 08/16/2021   K 4.7 08/16/2021   CO2 32 08/16/2021   GLUCOSE 102 (H) 08/16/2021   BUN 31 (H) 08/16/2021   CREATININE 1.10 (H) 08/16/2021   BILITOT 0.4 08/16/2021   ALKPHOS 62 05/18/2018   AST 21  08/16/2021   ALT 13 08/16/2021   PROT 7.3 08/16/2021   ALBUMIN 2.8 (L) 05/21/2018   CALCIUM 9.6 08/16/2021   ANIONGAP 10 03/13/2020   EGFR 51 (L) 08/16/2021   Lab Results  Component Value Date   CHOL 138 08/16/2021   Lab Results  Component Value Date   HDL 39 (L) 08/16/2021   Lab Results  Component Value Date   LDLCALC 69 08/16/2021   Lab Results  Component Value Date   TRIG 256 (H) 08/16/2021   Lab Results  Component Value Date   CHOLHDL 3.5 08/16/2021   Lab Results  Component Value Date   HGBA1C 5 12/16/2020       Assessment & Plan:   1. Facial cellulitis: Continue Doxycycline, expand coverage to Augmentin as well. Appears to be resolving. Follow up in 6 days for recheck however if she develops fevers/chills or rash is spreading, more swollen or painful or starting to affect eye with pain, vision change, pain with EOM she needs to go to the ER immediately. Patient voices understanding and agreement.   - amoxicillin-clavulanate (AUGMENTIN) 875-125 MG tablet; Take 1 tablet by mouth 2 (two) times daily for 10 days.  Dispense: 20 tablet; Refill: 0  2. Anxiety: Buspar refilled.   - busPIRone (BUSPAR) 5 MG tablet; Take 1-2 tablets (5-10 mg total) by mouth 2 (two) times daily as needed (anxiety/nerves/panic).  Dispense: 180 tablet; Refill: 1  Follow up in 6 days for recheck    Teodora Medici, DO

## 2021-09-25 ENCOUNTER — Encounter: Payer: Self-pay | Admitting: Internal Medicine

## 2021-09-25 ENCOUNTER — Ambulatory Visit (INDEPENDENT_AMBULATORY_CARE_PROVIDER_SITE_OTHER): Payer: Medicare Other | Admitting: Internal Medicine

## 2021-09-25 VITALS — BP 130/62 | HR 93 | Temp 98.6°F | Resp 16 | Ht 61.0 in | Wt 141.6 lb

## 2021-09-25 DIAGNOSIS — L03211 Cellulitis of face: Secondary | ICD-10-CM | POA: Diagnosis not present

## 2021-09-25 MED ORDER — DOXYCYCLINE HYCLATE 100 MG PO TABS: 100.0000 mg | ORAL_TABLET | Freq: Two times a day (BID) | ORAL | 0 refills | Status: AC

## 2021-09-25 NOTE — Progress Notes (Signed)
Established Patient Office Visit  Subjective:  Patient ID: Elizabeth [name], female    DOB: 03-29-1941  Age: 81 y.o. MRN: [id number]  CC:  Chief Complaint  Patient presents with   Follow-up   Cellulitis    HPI Elizabeth [name] presents for follow up on facial cellulitis. First seen for this issue in the office last week, treated with Ceftriaxone IM 1 g and started on Doxycycline. On follow up the next day, symtpoms had improved and her antibiotic coverage was extended to Augmentin as well. She called the office stating she could not tolerate the Augmentin, as it was causing diarrhea. She took 3 doses of this. Today she states that the redness has improved. She still has mild swelling and pain but both of these have resolved significantly. No fevers. No EOM pain. Diarrhea resolved.   Past Medical History:  Diagnosis Date   3-vessel CAD    05/2018 LHC with a single remaining conduit which is the left main coronary artery supplying the LAD.  Both the circumflex and right coronary artery were occluded.  The distal left main, proximal LAD is aneurysmal and heavily calcified.  The LAD supplies well-formed collaterals to the PDA.  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)    EF 60-65%, 05/2018   History of non-ST elevation myocardial infarction (NSTEMI)    05/2018 with subsequent LHC and in the setting of acute aortic ulcer and hematoma   Hyperlipidemia    Hypertension    Internal carotid artery stenosis, left    80-99% 05/2018   Intramural hematoma of thoracic aorta    05/2018   Mitral regurgitation    Mild by 05/2018 echo   Paroxysmal SVT (supraventricular tachycardia) (HCC)    05/2018   Peripheral vascular disease ([facility])    extensive vascular dz s/p b/l  carotid endarterectomies (2014) and iliac stenting, AAA, aortic ulcer   Right renal artery stenosis (H[facility])    05/2018   Smoking hx    Quit ~1992   Status post bilateral carotid endarterectomy    Followed by Vascular surgery with most recent images 05/2018 showing L ICA 80-99% stenosis   Subclavian artery stenosis, left (HCC)    05/2018    Past Surgical History:  Procedure Laterality Date   APPENDECTOMY     CARDIAC CATHETERIZATION     HIP SURGERY Left    x3   LEFT HEART CATH AND CORONARY ANGIOGRAPHY N/A 05/19/2018   Procedure: LEFT HEART CATH AND CORONARY ANGIOGRAPHY;  Surgeon: Lyn Records, MD;  Location: [facility];  Service: Cardiovascular;  Laterality: N/A;   PERIPHERAL VASCULAR CATHETERIZATION Right 02/02/2016   Procedure: Lower Extremity Angiography;  Surgeon: Annice Needy, MD;  Location: [facility];  Service: Cardiovascular;  Laterality: Right;    Family History  Problem Relation Age of Onset   Hypertension Mother    Stroke Mother    Hypertension Father    Stroke Father    Hypertension Sister    Hyperlipidemia Sister    Hodgkin's lymphoma Son    Heart disease Son    Kidney disease Son    Mental illness Neg Hx     Social History   Socioeconomic History   Marital status: Widowed  Spouse name: Not on file   Number of children: 2   Years of education: Not on file   Highest education level: GED or equivalent  Occupational History   Occupation: diabled  Tobacco Use   Smoking status: Former    Types: Cigarettes    Quit date: 02/02/1999    Years since quitting: 22.6   Smokeless tobacco: Never  Vaping Use   Vaping Use: Never used  Substance and Sexual Activity   Alcohol use: No   Drug use: No   Sexual activity: Not Currently  Other Topics Concern   Not on file  Social History Narrative   Pt lives alone, does not drive but sister drives her when needed.    Social Determinants of Health   Financial Resource Strain: Low Risk    Difficulty  of Paying Living Expenses: Not hard at all  Food Insecurity: No Food Insecurity   Worried About Charity fundraiser in the Last Year: Never true   [place] in the Last Year: Never true  Transportation Needs: No Transportation Needs   Lack of Transportation (Medical): No   Lack of Transportation (Non-Medical): No  Physical Activity: Inactive   Days of Exercise per Week: 0 days   Minutes of Exercise per Session: 0 min  Stress: No Stress Concern Present   Feeling of Stress : Only a little  Social Connections: Moderately Isolated   Frequency of Communication with Friends and Family: More than three times a week   Frequency of Social Gatherings with Friends and Family: More than three times a week   Attends Religious Services: More than 4 times per year   Active Member of [employer] or Organizations: No   Attends Archivist Meetings: Never   Marital Status: Widowed  Human resources officer Violence: Not At Risk   Fear of Current or Ex-Partner: No   Emotionally Abused: No   Physically Abused: No   Sexually Abused: No    Outpatient Medications Prior to Visit  Medication Sig Dispense Refill   amLODipine (NORVASC) 10 MG tablet TAKE 1 TABLET BY MOUTH EVERY DAY 90 tablet 3   amoxicillin-clavulanate (AUGMENTIN) 875-125 MG tablet Take 1 tablet by mouth 2 (two) times daily for 10 days. 20 tablet 0   aspirin EC 81 MG tablet Take 1 tablet (81 mg total) by mouth daily. Swallow whole. 90 tablet 3   busPIRone (BUSPAR) 5 MG tablet Take 1-2 tablets (5-10 mg total) by mouth 2 (two) times daily as needed (anxiety/nerves/panic). 180 tablet 1   cholecalciferol (VITAMIN D) 1000 units tablet Take 1,000 Units by mouth daily.     citalopram (CELEXA) 20 MG tablet Take 1 tablet (20 mg total) by mouth at bedtime. 90 tablet 3   clopidogrel (PLAVIX) 75 MG tablet TAKE 1 TABLET BY MOUTH EVERY DAY 90 tablet 3   Cyanocobalamin (VITAMIN B-12 PO) Take 1 tablet by mouth daily.     doxycycline (VIBRA-TABS) 100 MG  tablet Take 1 tablet (100 mg total) by mouth 2 (two) times daily for 7 days. 14 tablet 0   furosemide (LASIX) 20 MG tablet Take 1 tablet (20 mg total) by mouth daily. 30 tablet 3   glucosamine-chondroitin 500-400 MG tablet Take 1 tablet by mouth daily.     HYDROcodone-acetaminophen (NORCO) 10-325 MG tablet Take 1 tablet by mouth every 4 (four) hours as needed for severe pain. Must last 30 days. 180 tablet 0   HYDROcodone-acetaminophen (NORCO) 10-325 MG tablet Take 1 tablet by  mouth every 4 (four) hours as needed for severe pain. Must last 30 days. 180 tablet 0   HYDROcodone-acetaminophen (NORCO) 10-325 MG tablet Take 1 tablet by mouth every 4 (four) hours as needed for severe pain. Must last 30 days. 180 tablet 0   [START ON 10/21/2021] HYDROcodone-acetaminophen (NORCO) 10-325 MG tablet Take 1 tablet by mouth every 4 (four) hours as needed for severe pain. Must last 30 days. 180 tablet 0   [START ON 11/20/2021] HYDROcodone-acetaminophen (NORCO) 10-325 MG tablet Take 1 tablet by mouth every 4 (four) hours as needed for severe pain. Must last 30 days. 180 tablet 0   icosapent Ethyl (VASCEPA) 1 g capsule Take 2 capsules (2 g total) by mouth 2 (two) times daily. 120 capsule 3   isosorbide mononitrate (IMDUR) 30 MG 24 hr tablet TAKE 1/2 OF A TABLET (15 MG TOTAL) BY MOUTH DAILY 45 tablet 3   lisinopril (ZESTRIL) 40 MG tablet TAKE 1 TABLET BY MOUTH EVERY DAY 90 tablet 3   metoprolol tartrate (LOPRESSOR) 100 MG tablet Take 1 tablet (100 mg total) by mouth 3 (three) times daily. 270 tablet 1   Multiple Vitamin (MULTIVITAMIN) tablet Take 1 tablet by mouth daily.     nitroGLYCERIN (NITROSTAT) 0.4 MG SL tablet Place 1 tablet (0.4 mg total) under the tongue every 5 (five) minutes as needed for chest pain. 25 tablet 3   potassium citrate (UROCIT-K) 10 MEQ (1080 MG) SR tablet TAKE 1 TABLET BY MOUTH 2 TIMES DAILY. 60 tablet 5   rosuvastatin (CRESTOR) 40 MG tablet Take 1 tablet (40 mg total) by mouth daily. 90 tablet 3    No facility-administered medications prior to visit.    Allergies  Allergen Reactions   Carvedilol Other (See Comments)    intolerance   Codeine Other (See Comments)    Reaction: Unsure   Hydralazine Other (See Comments)    intolerance   Sulfa Antibiotics Other (See Comments)    Mouth blisters    ROS Review of Systems  Constitutional:  Negative for chills and fever.  Eyes:  Negative for pain and visual disturbance.  Gastrointestinal:  Negative for abdominal distention, diarrhea and nausea.  Skin:  Positive for rash.     Objective:    Physical Exam Constitutional:      Appearance: Normal appearance.  HENT:     Head: Normocephalic and atraumatic.  Eyes:     Extraocular Movements: Extraocular movements intact.     Conjunctiva/sclera: Conjunctivae normal.     Pupils: Pupils are equal, round, and reactive to light.  Cardiovascular:     Rate and Rhythm: Normal rate and regular rhythm.  Pulmonary:     Effort: Pulmonary effort is normal.     Breath sounds: Normal breath sounds.  Skin:    General: Skin is warm and dry.     Comments: Erythema with swelling on right side of face reduced but still present. Large scab on side of nose still present.  Neurological:     General: No focal deficit present.     Mental Status: She is alert. Mental status is at baseline.  Psychiatric:        Mood and Affect: Mood normal.        Behavior: Behavior normal.    BP 130/62    Pulse 93    Temp 98.6 F (37 C)    Resp 16    Ht _0  (1.549 m)    Wt 141 lb 9.6 oz (64.2 kg)  SpO2 93%    BMI 26.76 kg/m  Wt Readings from Last 3 Encounters:  09/19/21 143 lb (64.9 kg)  09/18/21 143 lb 9.6 oz (65.1 kg)  09/12/21 141 lb (64 kg)     Health Maintenance Due  Topic Date Due   TETANUS/TDAP  Never done   Zoster Vaccines- Shingrix (1 of 2) Never done   DEXA SCAN  Never done    There are no preventive care reminders to display for this patient.  Lab Results  Component Value Date   TSH  1.43 08/04/2020   Lab Results  Component Value Date   WBC 6.4 08/16/2021   HGB 14.6 08/16/2021   HCT 42.9 08/16/2021   MCV 100.2 (H) 08/16/2021   PLT 221 08/16/2021   Lab Results  Component Value Date   NA 140 08/16/2021   K 4.7 08/16/2021   CO2 32 08/16/2021   GLUCOSE 102 (H) 08/16/2021   BUN 31 (H) 08/16/2021   CREATININE 1.10 (H) 08/16/2021   BILITOT 0.4 08/16/2021   ALKPHOS 62 05/18/2018   AST 21 08/16/2021   ALT 13 08/16/2021   PROT 7.3 08/16/2021   ALBUMIN 2.8 (L) 05/21/2018   CALCIUM 9.6 08/16/2021   ANIONGAP 10 03/13/2020   EGFR 51 (L) 08/16/2021   Lab Results  Component Value Date   CHOL 138 08/16/2021   Lab Results  Component Value Date   HDL 39 (L) 08/16/2021   Lab Results  Component Value Date   LDLCALC 69 08/16/2021   Lab Results  Component Value Date   TRIG 256 (H) 08/16/2021   Lab Results  Component Value Date   CHOLHDL 3.5 08/16/2021   Lab Results  Component Value Date   HGBA1C 5 12/16/2020      Assessment & Plan:   1. Facial cellulitis: Continue Doxy for a total of 14 days. Discontinue Augmentin. Follow up in 1 week for recheck.   - doxycycline (VIBRA-TABS) 100 MG tablet; Take 1 tablet (100 mg total) by mouth 2 (two) times daily for 7 days.  Dispense: 14 tablet; Refill: 0   Follow-up: Return in about 1 week (around 10/02/2021).    Teodora Medici, DO

## 2021-09-25 NOTE — Patient Instructions (Signed)
It was great seeing you today!  Plan discussed at today's visit: -Doxycycline 100 mg twice a day for another week  Follow up in: 1 week   Take care and let us know if you have any questions or concerns prior to your next visit.  Dr. Caralee Ates

## 2021-10-02 ENCOUNTER — Encounter: Payer: Self-pay | Admitting: Internal Medicine

## 2021-10-02 ENCOUNTER — Ambulatory Visit (INDEPENDENT_AMBULATORY_CARE_PROVIDER_SITE_OTHER): Payer: Medicare Other | Admitting: Internal Medicine

## 2021-10-02 VITALS — BP 162/84 | HR 77 | Temp 98.4°F | Resp 16 | Ht 61.0 in | Wt 139.4 lb

## 2021-10-02 DIAGNOSIS — L03211 Cellulitis of face: Secondary | ICD-10-CM

## 2021-10-02 DIAGNOSIS — I10 Essential (primary) hypertension: Secondary | ICD-10-CM | POA: Diagnosis not present

## 2021-10-02 NOTE — Patient Instructions (Addendum)
It was great seeing you today!  Plan discussed at today's visit: -If rash gets worse, spreads, becomes more painful or if you develop drainage or fevers, please let me know immediately or go to the ER -Stop all topicals on face and just keep skin clean with fragrance free soap or cleanser   Follow up in: 1 month   Take care and let us know if you have any questions or concerns prior to your next visit.  Dr. Caralee Ates

## 2021-10-02 NOTE — Progress Notes (Signed)
Established Patient Office Visit  Subjective:  Patient ID: Elizabeth [name], female    DOB: Feb 20, 1941  Age: 81 y.o. MRN: [id number]  CC:  Chief Complaint  Patient presents with   Follow-up   Cellulitis    HPI [name] presents for follow up on facial cellulitis. First seen for this issue in the office on 09/18/21, treated with Ceftriaxone IM 1 g and started on Doxycycline. On follow up the next day, symtpoms had improved and her antibiotic coverage was extended to Augmentin as well. She called the office stating she could not tolerate the Augmentin, as it was causing diarrhea after 3 doses. She continues to endorse decreased redness, swelling and pain. She continues to deny fevers, EOM pain, vision changes. She finished the 2 weeks of Doxycycline this morning. She has been using Neosporin on the scab on her nose but has discontinued this over the weekend. She is only using Dove fragrance free soap on her face and no other products.    Past Medical History:  Diagnosis Date   3-vessel CAD    05/2018 LHC with a single remaining conduit which is the left main coronary artery supplying the LAD.  Both the circumflex and right coronary artery were occluded.  The distal left main, proximal LAD is aneurysmal and heavily calcified.  The LAD supplies well-formed collaterals to the PDA.  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)    EF 60-65%, 05/2018   History of non-ST elevation myocardial infarction (NSTEMI)    05/2018 with subsequent LHC and in the setting of acute aortic ulcer and hematoma   Hyperlipidemia    Hypertension    Internal carotid artery stenosis, left    80-99% 05/2018   Intramural hematoma of thoracic aorta    05/2018   Mitral regurgitation     Mild by 05/2018 echo   Paroxysmal SVT (supraventricular tachycardia) ([place])    05/2018   Peripheral vascular disease ([place])    extensive vascular dz s/p b/l carotid endarterectomies (2014) and iliac stenting, AAA, aortic ulcer   Right renal artery stenosis ([place])    05/2018   Smoking hx    Quit ~1992   Status post bilateral carotid endarterectomy    Followed by Vascular surgery with most recent images 05/2018 showing L ICA 80-99% stenosis   Subclavian artery stenosis, left ([place])    05/2018    Past Surgical History:  Procedure Laterality Date   APPENDECTOMY     CARDIAC CATHETERIZATION     HIP SURGERY Left    x3   LEFT HEART CATH AND CORONARY ANGIOGRAPHY N/A 05/19/2018   Procedure: LEFT HEART CATH AND CORONARY ANGIOGRAPHY;  Surgeon: Belva Crome, MD;  Location: [facility];  Service: Cardiovascular;  Laterality: N/A;   PERIPHERAL VASCULAR CATHETERIZATION Right 02/02/2016   Procedure: Lower Extremity Angiography;  Surgeon: Algernon Huxley, MD;  Location: [facility];  Service: Cardiovascular;  Laterality: Right;    Family History  Problem Relation Age of Onset   Hypertension Mother    Stroke Mother    Hypertension Father    Stroke Father    Hypertension Sister    Hyperlipidemia Sister    Hodgkin's lymphoma Son    Heart disease Son  Kidney disease Son    Mental illness Neg Hx     Social History   Socioeconomic History   Marital status: Widowed    Spouse name: Not on file   Number of children: 2   Years of education: Not on file   Highest education level: GED or equivalent  Occupational History   Occupation: diabled  Tobacco Use   Smoking status: Former    Types: Cigarettes    Quit date: 02/02/1999    Years since quitting: 22.6   Smokeless tobacco: Never  Vaping Use   Vaping Use: Never used  Substance and Sexual Activity   Alcohol use: No   Drug use: No   Sexual activity: Not Currently  Other Topics Concern   Not on file  Social History Narrative    Pt lives alone, does not drive but sister drives her when needed.    Social Determinants of Health   Financial Resource Strain: Low Risk    Difficulty of Paying Living Expenses: Not hard at all  Food Insecurity: No Food Insecurity   Worried About Charity fundraiser in the Last Year: Never true   [place] in the Last Year: Never true  Transportation Needs: No Transportation Needs   Lack of Transportation (Medical): No   Lack of Transportation (Non-Medical): No  Physical Activity: Inactive   Days of Exercise per Week: 0 days   Minutes of Exercise per Session: 0 min  Stress: No Stress Concern Present   Feeling of Stress : Only a little  Social Connections: Moderately Isolated   Frequency of Communication with Friends and Family: More than three times a week   Frequency of Social Gatherings with Friends and Family: More than three times a week   Attends Religious Services: More than 4 times per year   Active Member of [employer] or Organizations: No   Attends Archivist Meetings: Never   Marital Status: Widowed  Human resources officer Violence: Not At Risk   Fear of Current or Ex-Partner: No   Emotionally Abused: No   Physically Abused: No   Sexually Abused: No    Outpatient Medications Prior to Visit  Medication Sig Dispense Refill   amLODipine (NORVASC) 10 MG tablet TAKE 1 TABLET BY MOUTH EVERY DAY 90 tablet 3   aspirin EC 81 MG tablet Take 1 tablet (81 mg total) by mouth daily. Swallow whole. 90 tablet 3   busPIRone (BUSPAR) 5 MG tablet Take 1-2 tablets (5-10 mg total) by mouth 2 (two) times daily as needed (anxiety/nerves/panic). 180 tablet 1   cholecalciferol (VITAMIN D) 1000 units tablet Take 1,000 Units by mouth daily.     citalopram (CELEXA) 20 MG tablet Take 1 tablet (20 mg total) by mouth at bedtime. 90 tablet 3   clopidogrel (PLAVIX) 75 MG tablet TAKE 1 TABLET BY MOUTH EVERY DAY 90 tablet 3   Cyanocobalamin (VITAMIN B-12 PO) Take 1 tablet by mouth daily.      doxycycline (VIBRA-TABS) 100 MG tablet Take 1 tablet (100 mg total) by mouth 2 (two) times daily for 7 days. 14 tablet 0   furosemide (LASIX) 20 MG tablet Take 1 tablet (20 mg total) by mouth daily. 30 tablet 3   glucosamine-chondroitin 500-400 MG tablet Take 1 tablet by mouth daily.     HYDROcodone-acetaminophen (NORCO) 10-325 MG tablet Take 1 tablet by mouth every 4 (four) hours as needed for severe pain. Must last 30 days. 180 tablet 0   [START ON 10/21/2021]  HYDROcodone-acetaminophen (NORCO) 10-325 MG tablet Take 1 tablet by mouth every 4 (four) hours as needed for severe pain. Must last 30 days. 180 tablet 0   [START ON 11/20/2021] HYDROcodone-acetaminophen (NORCO) 10-325 MG tablet Take 1 tablet by mouth every 4 (four) hours as needed for severe pain. Must last 30 days. 180 tablet 0   icosapent Ethyl (VASCEPA) 1 g capsule Take 2 capsules (2 g total) by mouth 2 (two) times daily. 120 capsule 3   isosorbide mononitrate (IMDUR) 30 MG 24 hr tablet TAKE 1/2 OF A TABLET (15 MG TOTAL) BY MOUTH DAILY 45 tablet 3   lisinopril (ZESTRIL) 40 MG tablet TAKE 1 TABLET BY MOUTH EVERY DAY 90 tablet 3   Multiple Vitamin (MULTIVITAMIN) tablet Take 1 tablet by mouth daily.     potassium citrate (UROCIT-K) 10 MEQ (1080 MG) SR tablet TAKE 1 TABLET BY MOUTH 2 TIMES DAILY. 60 tablet 5   rosuvastatin (CRESTOR) 40 MG tablet Take 1 tablet (40 mg total) by mouth daily. 90 tablet 3   HYDROcodone-acetaminophen (NORCO) 10-325 MG tablet Take 1 tablet by mouth every 4 (four) hours as needed for severe pain. Must last 30 days. 180 tablet 0   HYDROcodone-acetaminophen (NORCO) 10-325 MG tablet Take 1 tablet by mouth every 4 (four) hours as needed for severe pain. Must last 30 days. 180 tablet 0   metoprolol tartrate (LOPRESSOR) 100 MG tablet Take 1 tablet (100 mg total) by mouth 3 (three) times daily. 270 tablet 1   nitroGLYCERIN (NITROSTAT) 0.4 MG SL tablet Place 1 tablet (0.4 mg total) under the tongue every 5 (five) minutes as  needed for chest pain. 25 tablet 3   No facility-administered medications prior to visit.    Allergies  Allergen Reactions   Carvedilol Other (See Comments)    intolerance   Codeine Other (See Comments)    Reaction: Unsure   Hydralazine Other (See Comments)    intolerance   Sulfa Antibiotics Other (See Comments)    Mouth blisters    ROS Review of Systems  Constitutional:  Negative for chills and fever.  Eyes:  Negative for visual disturbance.  Skin:  Positive for rash.     Objective:    Physical Exam Constitutional:      Appearance: Normal appearance.  HENT:     Head: Normocephalic and atraumatic.  Eyes:     Extraocular Movements: Extraocular movements intact.     Conjunctiva/sclera: Conjunctivae normal.  Cardiovascular:     Rate and Rhythm: Normal rate and regular rhythm.  Pulmonary:     Effort: Pulmonary effort is normal.     Breath sounds: Normal breath sounds.  Musculoskeletal:     Right lower leg: No edema.     Left lower leg: No edema.  Skin:    General: Skin is warm and dry.     Comments: Left sided facial cellulitis improved, scab still present on nose  Neurological:     General: No focal deficit present.     Mental Status: She is alert. Mental status is at baseline.  Psychiatric:        Mood and Affect: Mood normal.        Behavior: Behavior normal.    BP (!) 162/84 Comment: BP pill taken before appt.   Pulse 77    Temp 98.4 F (36.9 C) (Oral)    Resp 16    Ht _0  (1.549 m)    Wt 139 lb 6.4 oz (63.2 kg)    SpO2 95%  BMI 26.34 kg/m  Wt Readings from Last 3 Encounters:  10/02/21 139 lb 6.4 oz (63.2 kg)  09/25/21 141 lb 9.6 oz (64.2 kg)  09/19/21 143 lb (64.9 kg)     There are no preventive care reminders to display for this patient.  There are no preventive care reminders to display for this patient.  Lab Results  Component Value Date   TSH 1.43 08/04/2020   Lab Results  Component Value Date   WBC 6.4 08/16/2021   HGB 14.6  08/16/2021   HCT 42.9 08/16/2021   MCV 100.2 (H) 08/16/2021   PLT 221 08/16/2021   Lab Results  Component Value Date   NA 140 08/16/2021   K 4.7 08/16/2021   CO2 32 08/16/2021   GLUCOSE 102 (H) 08/16/2021   BUN 31 (H) 08/16/2021   CREATININE 1.10 (H) 08/16/2021   BILITOT 0.4 08/16/2021   ALKPHOS 62 05/18/2018   AST 21 08/16/2021   ALT 13 08/16/2021   PROT 7.3 08/16/2021   ALBUMIN 2.8 (L) 05/21/2018   CALCIUM 9.6 08/16/2021   ANIONGAP 10 03/13/2020   EGFR 51 (L) 08/16/2021   Lab Results  Component Value Date   CHOL 138 08/16/2021   Lab Results  Component Value Date   HDL 39 (L) 08/16/2021   Lab Results  Component Value Date   LDLCALC 69 08/16/2021   Lab Results  Component Value Date   TRIG 256 (H) 08/16/2021   Lab Results  Component Value Date   CHOLHDL 3.5 08/16/2021   Lab Results  Component Value Date   HGBA1C 5 12/16/2020      Assessment & Plan:   1. Facial cellulitis: Resolving, competed antibiotics. Stop Neosporin, just use fragrance free soap/cleanser on face and follow up should symptoms return.   2. Essential hypertension: Blood pressure high today but she just took medication prior to appointment and found out some distressing person news today. No changes to medications, continue to check blood pressure at home and follow up in 1 month for recheck.    Follow-up: Return in about 4 weeks (around 10/30/2021).    Teodora Medici, DO

## 2021-10-11 ENCOUNTER — Other Ambulatory Visit: Payer: Self-pay | Admitting: Family Medicine

## 2021-10-11 DIAGNOSIS — I739 Peripheral vascular disease, unspecified: Secondary | ICD-10-CM

## 2021-10-12 ENCOUNTER — Other Ambulatory Visit: Payer: Self-pay | Admitting: Family Medicine

## 2021-10-25 ENCOUNTER — Other Ambulatory Visit: Payer: Self-pay | Admitting: Family Medicine

## 2021-10-25 DIAGNOSIS — I739 Peripheral vascular disease, unspecified: Secondary | ICD-10-CM

## 2021-10-26 ENCOUNTER — Other Ambulatory Visit: Payer: Self-pay | Admitting: Internal Medicine

## 2021-10-26 DIAGNOSIS — F419 Anxiety disorder, unspecified: Secondary | ICD-10-CM

## 2021-10-28 NOTE — Telephone Encounter (Signed)
Requested medications are due for refill today.  no ? ?Requested medications are on the active medications list.  yes ? ?Last refill. 09/19/2021 #180 1 refill ? ?Future visit scheduled.   yes ? ?Notes to clinic.  Medication is not due for refill. Pharmacy needs Dx code. ? ? ? ?Requested Prescriptions  ?Pending Prescriptions Disp Refills  ? busPIRone (BUSPAR) 5 MG tablet [Pharmacy Med Name: BUSPIRONE HCL 5 MG TABLET] 360 tablet 1  ?  Sig: Take 1-2 tablets (5-10 mg total) by mouth 2 (two) times daily as needed (anxiety/nerves/panic).  ?  ? Psychiatry: Anxiolytics/Hypnotics - Non-controlled Passed - 10/26/2021 11:33 AM  ?  ?  Passed - Valid encounter within last 12 months  ?  Recent Outpatient Visits   ? ?      ? 3 weeks ago Facial cellulitis  ? [facility] Margarita Mail, DO  ? 1 month ago Facial cellulitis  ? [facility] Margarita Mail, DO  ? 1 month ago Facial cellulitis  ? [facility] Margarita Mail, DO  ? 1 month ago Facial cellulitis  ? [facility] Margarita Mail, DO  ? 2 months ago Essential hypertension  ? [facility] Margarita Mail, DO  ? ?  ?  ?Future Appointments   ? ?        ? In 2 days Rumball, Darl Householder, DO [facility]  ? In 3 months Margarita Mail, DO [facility]  ? In 6 months  [facility]  ? ?  ? ?  ?  ?  ?  ?

## 2021-10-30 ENCOUNTER — Ambulatory Visit (INDEPENDENT_AMBULATORY_CARE_PROVIDER_SITE_OTHER): Payer: Medicare Other | Admitting: Family Medicine

## 2021-10-30 ENCOUNTER — Encounter: Payer: Self-pay | Admitting: Family Medicine

## 2021-10-30 VITALS — BP 142/80 | HR 67 | Temp 98.4°F | Resp 16 | Ht 61.0 in | Wt 143.1 lb

## 2021-10-30 DIAGNOSIS — I10 Essential (primary) hypertension: Secondary | ICD-10-CM

## 2021-10-30 DIAGNOSIS — I739 Peripheral vascular disease, unspecified: Secondary | ICD-10-CM

## 2021-10-30 MED ORDER — METRONIDAZOLE 0.75 % EX LOTN: 1.0000 "application " | TOPICAL_LOTION | Freq: Two times a day (BID) | CUTANEOUS | 0 refills | Status: DC

## 2021-10-30 MED ORDER — FUROSEMIDE 20 MG PO TABS: 20.0000 mg | ORAL_TABLET | Freq: Every day | ORAL | 1 refills | Status: DC

## 2021-10-30 MED ORDER — METOPROLOL TARTRATE 100 MG PO TABS: 100.0000 mg | ORAL_TABLET | Freq: Three times a day (TID) | ORAL | 1 refills | Status: DC

## 2021-10-30 MED ORDER — CLOPIDOGREL BISULFATE 75 MG PO TABS: 75.0000 mg | ORAL_TABLET | Freq: Every day | ORAL | 1 refills | Status: DC

## 2021-10-30 NOTE — Progress Notes (Signed)
? ? ?  SUBJECTIVE:  ? ?CHIEF COMPLAINT / HPI:  ? ?Cellulitis ?- previously seen 2/13 for facial cellulitis, received IM CTX and given doxycycline, extended to augmentin the next day but had GI upset with augmentin.  ?- finished doxycycline. ?- improved. Still a little red and sore. No fevers.  ? ?Hypertension, CAD, PVD: ?- Medications: amlodipine, plavix, imdur, NTG prn, metoprolol, lisinopril ?- Compliance: good. Has taken meds this am, due for another metoprolol dose soon.  ?- Checking BP at home: yes occasionally,  ?- Denies any SOB, CP, vision changes, LE edema, medication SEs, or symptoms of hypotension ? ? ?OBJECTIVE:  ? ?BP (!) 142/80   Pulse 67   Temp 98.4 ?F (36.9 ?C) (Oral)   Resp 16   Ht 5\' 1"  (1.549 m)   Wt 143 lb 1.6 oz (64.9 kg)   SpO2 95%   BMI 27.04 kg/m?   ?Gen: well appearing, in NAD ?Skin: redness to b/l nasolabial folds with some papules, L>R. No blistering or pustules.  ?HEENT: nasal canal clear without mass, pustules. ?Ext: WWP, no edema ? ? ?ASSESSMENT/PLAN:  ? ?Essential hypertension ?Slightly elevated today, improved on recheck. Keep log of home BP measurements and bring to follow up. No change to meds today. ?  ?Skin redness ?Improved s/p oral antibiotics but persistent. May have element of rosacea given location and papules present. Trial topical metronidazole. F/u in 2 weeks.  ? ?Myles Gip, DO ?

## 2021-10-30 NOTE — Assessment & Plan Note (Signed)
Slightly elevated today, improved on recheck. Keep log of home BP measurements and bring to follow up. No change to meds today. ?

## 2021-10-30 NOTE — Patient Instructions (Signed)
It was great to see you! ? ?Our plans for today:  ?- Use the antibiotic gel on your face twice daily. ?- Keep a log of your blood pressure and bring this when you come back. ?- Come back in 2 weeks.  ? ?Take care and seek immediate care sooner if you develop any concerns.  ? ?Dr. Linwood Dibbles ? ?

## 2021-11-08 ENCOUNTER — Other Ambulatory Visit: Payer: Self-pay | Admitting: Family Medicine

## 2021-11-10 ENCOUNTER — Other Ambulatory Visit: Payer: Self-pay | Admitting: *Deleted

## 2021-11-10 MED ORDER — FUROSEMIDE 20 MG PO TABS: 20.0000 mg | ORAL_TABLET | Freq: Every day | ORAL | 3 refills | Status: DC

## 2021-11-13 ENCOUNTER — Other Ambulatory Visit: Payer: Self-pay | Admitting: Family Medicine

## 2021-11-13 ENCOUNTER — Ambulatory Visit (INDEPENDENT_AMBULATORY_CARE_PROVIDER_SITE_OTHER): Payer: Medicare Other | Admitting: Family Medicine

## 2021-11-13 ENCOUNTER — Encounter: Payer: Self-pay | Admitting: Family Medicine

## 2021-11-13 VITALS — BP 152/84 | HR 78 | Temp 98.2°F | Resp 16 | Ht 61.0 in | Wt 143.3 lb

## 2021-11-13 DIAGNOSIS — I10 Essential (primary) hypertension: Secondary | ICD-10-CM | POA: Diagnosis not present

## 2021-11-13 DIAGNOSIS — L03211 Cellulitis of face: Secondary | ICD-10-CM

## 2021-11-13 DIAGNOSIS — L719 Rosacea, unspecified: Secondary | ICD-10-CM | POA: Diagnosis not present

## 2021-11-13 DIAGNOSIS — I25118 Atherosclerotic heart disease of native coronary artery with other forms of angina pectoris: Secondary | ICD-10-CM

## 2021-11-13 NOTE — Assessment & Plan Note (Signed)
slighlty elevated but home readings at goal for age. No changes. F/u as scheduled. ?

## 2021-11-13 NOTE — Progress Notes (Signed)
? ? ?  SUBJECTIVE:  ? ?CHIEF COMPLAINT / HPI:  ? ?Hypertension: ?- Medications: amlodipine, plavix, imdur, NTG prn, metoprolol, lisinopril ?- Compliance: good ?- Checking BP at home: yes, SBP 130s. ? ?Facial redness ?- previously seen 2/13, 2/27 for facial cellulitis, received IM CTX and given doxycycline, extended to augmentin the next day but had GI upset with augmentin.  ?- finished doxycycline with improvement ?- seen in f/u 3/27, still a little red and sore, thought ?rosacea contributing, trialed metro gel.  ?- has noted much improvement with metro gel. Still a little red and sore but much improved.  ?  ? ?OBJECTIVE:  ? ?BP (!) 152/84   Pulse 78   Temp 98.2 ?F (36.8 ?C) (Oral)   Resp 16   Ht 5\' 1"  (1.549 m)   Wt 143 lb 4.8 oz (65 kg)   SpO2 92%   BMI 27.08 kg/m?   ?Gen: well appearing, in NAD ?Card: Reg rate ?Lungs: Comfortable WOB on RA ?Skin: slight erythema to b/l nasolabial folds L>R ?Ext: WWP, no edema ? ? ?ASSESSMENT/PLAN:  ? ?Essential hypertension ?slighlty elevated but home readings at goal for age. No changes. F/u as scheduled. ?  ?Facial redness ?Improved s/p cellulitis and rosacea treatment. Continue to monitor. ? ? , DO ?

## 2021-11-14 ENCOUNTER — Other Ambulatory Visit: Payer: Self-pay

## 2021-11-14 MED ORDER — METOPROLOL TARTRATE 100 MG PO TABS: 100.0000 mg | ORAL_TABLET | Freq: Three times a day (TID) | ORAL | 2 refills | Status: DC

## 2021-11-14 MED ORDER — FUROSEMIDE 20 MG PO TABS: 20.0000 mg | ORAL_TABLET | Freq: Every day | ORAL | 2 refills | Status: DC

## 2021-11-14 NOTE — Telephone Encounter (Signed)
Requested medication (s) are due for refill today:   Yes ? ?Requested medication (s) are on the active medication list:   Yes ? ?Future visit scheduled:   Yes ? ? ?Last ordered: 10/30/2021 59 ml, 0 refills ? ?Returned because no protocol assigned to this medication ?  ? ?Requested Prescriptions  ?Pending Prescriptions Disp Refills  ? METRONIDAZOLE, TOPICAL, 0.75 % LOTN [Pharmacy Med Name: METRONIDAZOLE 0.75% LOTION] 59 mL 0  ?  Sig: Apply 1 application. topically 2 (two) times daily. After washing.  ?  ? Off-Protocol Failed - 11/13/2021  8:35 PM  ?  ?  Failed - Medication not assigned to a protocol, review manually.  ?  ?  Passed - Valid encounter within last 12 months  ?  Recent Outpatient Visits   ? ?      ? Yesterday Essential hypertension  ? [facility] [place] Dense M, DO  ? 2 weeks ago Essential hypertension  ? [facility] [place] M, DO  ? 1 month ago Facial cellulitis  ? [facility] Margarita Mail, DO  ? 1 month ago Facial cellulitis  ? [facility] Margarita Mail, DO  ? 1 month ago Facial cellulitis  ? [facility] Margarita Mail, DO  ? ?  ?  ?Future Appointments   ? ?        ? In 3 months Margarita Mail, DO [facility]  ? In 5 months  [facility]  ? ?  ? ?  ?  ?  ? ?

## 2021-12-07 ENCOUNTER — Ambulatory Visit
Payer: Medicare Other | Attending: Student in an Organized Health Care Education/Training Program | Admitting: Student in an Organized Health Care Education/Training Program

## 2021-12-07 ENCOUNTER — Encounter: Payer: Self-pay | Admitting: Student in an Organized Health Care Education/Training Program

## 2021-12-07 VITALS — BP 139/66 | HR 64 | Resp 16 | Ht 61.0 in | Wt 143.0 lb

## 2021-12-07 DIAGNOSIS — M25552 Pain in left hip: Secondary | ICD-10-CM | POA: Diagnosis present

## 2021-12-07 DIAGNOSIS — M5442 Lumbago with sciatica, left side: Secondary | ICD-10-CM | POA: Diagnosis present

## 2021-12-07 DIAGNOSIS — Z9889 Other specified postprocedural states: Secondary | ICD-10-CM | POA: Insufficient documentation

## 2021-12-07 DIAGNOSIS — M1652 Unilateral post-traumatic osteoarthritis, left hip: Secondary | ICD-10-CM | POA: Insufficient documentation

## 2021-12-07 DIAGNOSIS — G8921 Chronic pain due to trauma: Secondary | ICD-10-CM | POA: Diagnosis not present

## 2021-12-07 DIAGNOSIS — Z79891 Long term (current) use of opiate analgesic: Secondary | ICD-10-CM | POA: Insufficient documentation

## 2021-12-07 DIAGNOSIS — G8929 Other chronic pain: Secondary | ICD-10-CM | POA: Insufficient documentation

## 2021-12-07 DIAGNOSIS — I25118 Atherosclerotic heart disease of native coronary artery with other forms of angina pectoris: Secondary | ICD-10-CM | POA: Insufficient documentation

## 2021-12-07 DIAGNOSIS — I739 Peripheral vascular disease, unspecified: Secondary | ICD-10-CM | POA: Diagnosis not present

## 2021-12-07 MED ORDER — HYDROCODONE-ACETAMINOPHEN 10-325 MG PO TABS: 1.0000 | ORAL_TABLET | ORAL | 0 refills | Status: DC | PRN
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
## 2021-12-07 NOTE — Progress Notes (Signed)
Nursing Pain Medication Assessment:  ?Safety precautions to be maintained throughout the outpatient stay will include: orient to surroundings, keep bed in low position, maintain call bell within reach at all times, provide assistance with transfer out of bed and ambulation.  ?Medication Inspection Compliance: Pill count conducted under aseptic conditions, in front of the patient. Neither the pills nor the bottle was removed from the patient's sight at any time. Once count was completed pills were immediately returned to the patient in their original bottle. ? ?Medication: Hydrocodone/APAP ?Pill/Patch Count:  86 of 180 pills remain ?Pill/Patch Appearance: Markings consistent with prescribed medication ?Bottle Appearance: Standard pharmacy container. Clearly labeled. ?Filled Date: 04 / 18 / 2023 ?Last Medication intake:  Today ?

## 2021-12-11 ENCOUNTER — Other Ambulatory Visit: Payer: Self-pay | Admitting: Family Medicine

## 2021-12-11 DIAGNOSIS — I10 Essential (primary) hypertension: Secondary | ICD-10-CM

## 2022-01-04 ENCOUNTER — Other Ambulatory Visit: Payer: Self-pay | Admitting: Family Medicine

## 2022-01-24 ENCOUNTER — Other Ambulatory Visit: Payer: Self-pay

## 2022-01-24 DIAGNOSIS — I25118 Atherosclerotic heart disease of native coronary artery with other forms of angina pectoris: Secondary | ICD-10-CM

## 2022-01-24 DIAGNOSIS — E785 Hyperlipidemia, unspecified: Secondary | ICD-10-CM

## 2022-01-24 MED ORDER — ROSUVASTATIN CALCIUM 40 MG PO TABS: 40.0000 mg | ORAL_TABLET | Freq: Every day | ORAL | 3 refills | Status: DC

## 2022-02-07 ENCOUNTER — Telehealth: Payer: Self-pay | Admitting: Family Medicine

## 2022-02-07 DIAGNOSIS — I739 Peripheral vascular disease, unspecified: Secondary | ICD-10-CM

## 2022-02-08 NOTE — Telephone Encounter (Signed)
Refused the Plavix 75 mg refill request because it is being requested too soon.  10/30/2021 #90, 1 refill was given

## 2022-02-10 ENCOUNTER — Other Ambulatory Visit: Payer: Self-pay | Admitting: Family Medicine

## 2022-02-12 NOTE — Telephone Encounter (Signed)
90 day courtesy RF Requested Prescriptions  Pending Prescriptions Disp Refills  . citalopram (CELEXA) 20 MG tablet [Pharmacy Med Name: CITALOPRAM HBR 20 MG TABLET] 90 tablet 0    Sig: TAKE 1 TABLET BY MOUTH EVERYDAY AT BEDTIME     Psychiatry:  Antidepressants - SSRI Passed - 02/10/2022  9:07 AM      Passed - Valid encounter within last 6 months    Recent Outpatient Visits          3 months ago Essential hypertension   [facility] Caro Laroche, DO   3 months ago Essential hypertension   [facility] Caro Laroche, DO   4 months ago Facial cellulitis   [facility] Margarita Mail, DO   4 months ago Facial cellulitis   [facility] Margarita Mail, DO   4 months ago Facial cellulitis   [facility] Margarita Mail, DO      Future Appointments            Tomorrow Margarita Mail, DO [facility]   In 2 months  [facility]

## 2022-02-12 NOTE — Progress Notes (Unsigned)
Established Patient Office Visit  Subjective:  Patient ID: Elizabeth [name], female    DOB: [birth year]  Age: 81 y.o. MRN: [id number]  CC:  No chief complaint on file.   HPI Elizabeth [name] presents for follow up on chronic medical conditions.   Hypertension: -Medications: Lisinopril 40, Metoprolol 100 TID, Amlodipine 10, Imdur 30, Lasix 20 -Patient is compliant with above medications and reports no side effects. -Checking BP at home (average): 125-150/75-85 -Denies any SOB, CP, vision changes, LE edema or symptoms of hypotension. Will get facial flushing if BP high but this hasn't happened in several months  -Follows with Cardiology, last saw in December, now will see every year  HLD/CAD/PVD: -Medications: Crestor 40, Plavix 75, aspirin 81  -Patient is compliant with above medications and reports no side effects. No abnormal bleeding.  -Last lipid panel: Lipid Panel     Component Value Date/Time   CHOL 138 08/16/2021 1001   TRIG 256 (H) 08/16/2021 1001   HDL 39 (L) 08/16/2021 1001   CHOLHDL 3.5 08/16/2021 1001   VLDL 28 05/20/2018 0249   LDLCALC 69 08/16/2021 1001   Anxiety: -Currently on Celexa 20, Buspar 5 once a day, sometimes will take twice a day -Moods stable, doing well on current doses -Daily compliance without side effects  Health Maintenance: -Blood work UTD -Breast cancer screening: no longer screening per patient's wishes -Colon cancer screening: no longer screening per patient's wishes   Past Medical History:  Diagnosis Date   3-vessel CAD    05/2018 LHC with a single remaining conduit which is the left main coronary artery supplying the LAD.  Both the circumflex and right coronary artery were occluded.  The distal left main, proximal LAD is aneurysmal and heavily calcified.  The LAD supplies well-formed collaterals to the PDA.  Not felt to be candidate for revascularization and with recommendation for medical mgmt   AAA (abdominal aortic aneurysm) ([place])     4.3cm (12/2018)   Celiac artery stenosis (HCC)    Degenerative joint disease of left hip    Heart failure with preserved ejection fraction ([place])    EF 60-65%, 05/2018   History of non-ST elevation myocardial infarction (NSTEMI)    05/2018 with subsequent LHC and in the setting of acute aortic ulcer and hematoma   Hyperlipidemia    Hypertension    Internal carotid artery stenosis, left    80-99% 05/2018   Intramural hematoma of thoracic aorta ([place])    05/2018   Mitral regurgitation    Mild by 05/2018 echo   Paroxysmal SVT (supraventricular tachycardia) ([place])    05/2018   Peripheral vascular disease ([place])    extensive vascular dz s/p b/l carotid endarterectomies (2014) and iliac stenting, AAA, aortic ulcer   Right renal artery stenosis ([place])    05/2018   Smoking hx    Quit ~1992   Status post bilateral carotid endarterectomy    Followed by Vascular surgery with most recent images 05/2018 showing L ICA 80-99% stenosis   Subclavian artery stenosis, left ([place])    05/2018    Past Surgical History:  Procedure Laterality Date   APPENDECTOMY     CARDIAC CATHETERIZATION     HIP SURGERY Left    x3   LEFT HEART CATH AND CORONARY ANGIOGRAPHY N/A 05/19/2018   Procedure: LEFT HEART CATH AND CORONARY ANGIOGRAPHY;  Surgeon: Belva Crome, MD;  Location: [place] [facility];  Service: Cardiovascular;  Laterality: N/A;   PERIPHERAL VASCULAR CATHETERIZATION Right 02/02/2016  Procedure: Lower Extremity Angiography;  Surgeon: Algernon Huxley, MD;  Location: [place] [facility];  Service: Cardiovascular;  Laterality: Right;    Family History  Problem Relation Age of Onset   Hypertension Mother    Stroke Mother    Hypertension Father    Stroke Father    Hypertension Sister    Hyperlipidemia Sister    Hodgkin's lymphoma Son    Heart disease Son    Kidney disease Son    Mental illness Neg Hx     Social History   Socioeconomic History   Marital status: Widowed    Spouse name: Not on  file   Number of children: 2   Years of education: Not on file   Highest education level: GED or equivalent  Occupational History   Occupation: diabled  Tobacco Use   Smoking status: Former    Types: Cigarettes    Quit date: 02/02/1999    Years since quitting: 23.0   Smokeless tobacco: Never  Vaping Use   Vaping Use: Never used  Substance and Sexual Activity   Alcohol use: No   Drug use: No   Sexual activity: Not Currently  Other Topics Concern   Not on file  Social History Narrative   Pt lives alone, does not drive but sister drives her when needed.    Social Determinants of Health   Financial Resource Strain: Low Risk  (04/27/2021)   Overall Financial Resource Strain (CARDIA)    Difficulty of Paying Living Expenses: Not hard at all  Food Insecurity: No Food Insecurity (04/27/2021)   Hunger Vital Sign    Worried About Running Out of Food in the Last Year: Never true    Ran Out of Food in the Last Year: Never true  Transportation Needs: No Transportation Needs (04/27/2021)   PRAPARE - Hydrologist (Medical): No    Lack of Transportation (Non-Medical): No  Physical Activity: Inactive (04/27/2021)   Exercise Vital Sign    Days of Exercise per Week: 0 days    Minutes of Exercise per Session: 0 min  Stress: No Stress Concern Present (04/27/2021)   [place]    Feeling of Stress : Only a little  Social Connections: Moderately Isolated (04/27/2021)   Social Connection and Isolation Panel [NHANES]    Frequency of Communication with Friends and Family: More than three times a week    Frequency of Social Gatherings with Friends and Family: More than three times a week    Attends Religious Services: More than 4 times per year    Active Member of [employer] or Organizations: No    Attends Archivist Meetings: Never    Marital Status: Widowed  Intimate Partner Violence: Not At Risk  (04/27/2021)   Humiliation, Afraid, Rape, and Kick questionnaire    Fear of Current or Ex-Partner: No    Emotionally Abused: No    Physically Abused: No    Sexually Abused: No    Outpatient Medications Prior to Visit  Medication Sig Dispense Refill   amLODipine (NORVASC) 10 MG tablet TAKE 1 TABLET BY MOUTH EVERY DAY 90 tablet 1   aspirin EC 81 MG tablet Take 1 tablet (81 mg total) by mouth daily. Swallow whole. 90 tablet 3   busPIRone (BUSPAR) 5 MG tablet TAKE 1-2 TABLETS (5-10 MG TOTAL) BY MOUTH 2 (TWO) TIMES DAILY AS NEEDED (ANXIETY/NERVES/PANIC). 360 tablet 1   cholecalciferol (VITAMIN D) 1000 units  tablet Take 1,000 Units by mouth daily.     citalopram (CELEXA) 20 MG tablet TAKE 1 TABLET BY MOUTH EVERYDAY AT BEDTIME 90 tablet 0   clopidogrel (PLAVIX) 75 MG tablet Take 1 tablet (75 mg total) by mouth daily. 90 tablet 1   Cyanocobalamin (VITAMIN B-12 PO) Take 1 tablet by mouth daily.     furosemide (LASIX) 20 MG tablet Take 1 tablet (20 mg total) by mouth daily. 90 tablet 2   glucosamine-chondroitin 500-400 MG tablet Take 1 tablet by mouth daily.     HYDROcodone-acetaminophen (NORCO) 10-325 MG tablet Take 1 tablet by mouth every 4 (four) hours as needed for severe pain. Must last 30 days. 180 tablet 0   HYDROcodone-acetaminophen (NORCO) 10-325 MG tablet Take 1 tablet by mouth every 4 (four) hours as needed for severe pain. Must last 30 days. 180 tablet 0   HYDROcodone-acetaminophen (NORCO) 10-325 MG tablet Take 1 tablet by mouth every 4 (four) hours as needed for severe pain. Must last 30 days. 180 tablet 0   [START ON 02/19/2022] HYDROcodone-acetaminophen (NORCO) 10-325 MG tablet Take 1 tablet by mouth every 4 (four) hours as needed for severe pain. Must last 30 days. 180 tablet 0   icosapent Ethyl (VASCEPA) 1 g capsule Take 2 capsules (2 g total) by mouth 2 (two) times daily. 120 capsule 3   isosorbide mononitrate (IMDUR) 30 MG 24 hr tablet TAKE 1/2 OF A TABLET (15 MG TOTAL) BY MOUTH DAILY  45 tablet 3   lisinopril (ZESTRIL) 40 MG tablet TAKE 1 TABLET BY MOUTH EVERY DAY 90 tablet 3   metoprolol tartrate (LOPRESSOR) 100 MG tablet Take 1 tablet (100 mg total) by mouth 3 (three) times daily. 270 tablet 2   METRONIDAZOLE, TOPICAL, 0.75 % LOTN APPLY 1 APPLICATION. TOPICALLY 2 (TWO) TIMES DAILY. AFTER WASHING. 59 mL 0   Multiple Vitamin (MULTIVITAMIN) tablet Take 1 tablet by mouth daily.     nitroGLYCERIN (NITROSTAT) 0.4 MG SL tablet Place 1 tablet (0.4 mg total) under the tongue every 5 (five) minutes as needed for chest pain. 25 tablet 3   potassium citrate (UROCIT-K) 10 MEQ (1080 MG) SR tablet TAKE 1 TABLET BY MOUTH TWICE A DAY 60 tablet 2   rosuvastatin (CRESTOR) 40 MG tablet Take 1 tablet (40 mg total) by mouth daily. 90 tablet 3   No facility-administered medications prior to visit.    Allergies  Allergen Reactions   Carvedilol Other (See Comments)    intolerance   Codeine Other (See Comments)    Reaction: Unsure   Hydralazine Other (See Comments)    intolerance   Sulfa Antibiotics Other (See Comments)    Mouth blisters    ROS Review of Systems  Constitutional:  Negative for activity change, appetite change, chills and fever.  Eyes:  Negative for visual disturbance.  Respiratory:  Negative for cough and shortness of breath.   Cardiovascular:  Negative for chest pain.  Gastrointestinal:  Negative for abdominal pain and blood in stool.  Genitourinary:  Negative for hematuria.  Neurological:  Negative for dizziness and headaches.  Hematological:  Bruises/bleeds easily.      Objective:    Physical Exam Constitutional:      Appearance: Normal appearance.  HENT:     Head: Normocephalic and atraumatic.  Eyes:     Conjunctiva/sclera: Conjunctivae normal.  Cardiovascular:     Rate and Rhythm: Normal rate and regular rhythm.  Pulmonary:     Effort: Pulmonary effort is normal.  Breath sounds: Normal breath sounds.  Musculoskeletal:     Right lower leg: No  edema.     Left lower leg: No edema.  Skin:    General: Skin is warm and dry.  Neurological:     General: No focal deficit present.     Mental Status: She is alert. Mental status is at baseline.  Psychiatric:        Mood and Affect: Mood normal.        Behavior: Behavior normal.     There were no vitals taken for this visit. Wt Readings from Last 3 Encounters:  12/07/21 143 lb (64.9 kg)  11/13/21 143 lb 4.8 oz (65 kg)  10/30/21 143 lb 1.6 oz (64.9 kg)     Health Maintenance Due  Topic Date Due   Zoster Vaccines- Shingrix (1 of 2) Never done   COVID-19 Vaccine (6 - Pfizer series) 10/05/2021    There are no preventive care reminders to display for this patient.  Lab Results  Component Value Date   TSH 1.43 08/04/2020   Lab Results  Component Value Date   WBC 6.4 08/16/2021   HGB 14.6 08/16/2021   HCT 42.9 08/16/2021   MCV 100.2 (H) 08/16/2021   PLT 221 08/16/2021   Lab Results  Component Value Date   NA 140 08/16/2021   K 4.7 08/16/2021   CO2 32 08/16/2021   GLUCOSE 102 (H) 08/16/2021   BUN 31 (H) 08/16/2021   CREATININE 1.10 (H) 08/16/2021   BILITOT 0.4 08/16/2021   ALKPHOS 62 05/18/2018   AST 21 08/16/2021   ALT 13 08/16/2021   PROT 7.3 08/16/2021   ALBUMIN 2.8 (L) 05/21/2018   CALCIUM 9.6 08/16/2021   ANIONGAP 10 03/13/2020   EGFR 51 (L) 08/16/2021   Lab Results  Component Value Date   CHOL 138 08/16/2021   Lab Results  Component Value Date   HDL 39 (L) 08/16/2021   Lab Results  Component Value Date   LDLCALC 69 08/16/2021   Lab Results  Component Value Date   TRIG 256 (H) 08/16/2021   Lab Results  Component Value Date   CHOLHDL 3.5 08/16/2021   Lab Results  Component Value Date   HGBA1C 5 12/16/2020      Assessment & Plan:   1. Essential hypertension: BP stable, no changes to medications made. She is due for routine labs including CBC, CMP. Follow up in 6 months for recheck.  - CBC w/Diff/Platelet - COMPLETE METABOLIC PANEL  WITH GFR  2. Coronary artery disease involving native heart, unspecified vessel or lesion type, unspecified whether angina present: Stable, continue statin and repeat lipid panel.   - Lipid Profile  3. Need for hepatitis C screening test: Hepatitis C screening with above labs.   - Hepatitis C Antibody  4. Vaccine for streptococcus pneumoniae and influenza: Prevnar 20 administered today.  - Pneumococcal conjugate vaccine 20-valent (Prevnar 20)  5. Anxiety: Stable, continue Celexa and Buspar.   Follow-up: No follow-ups on file.    Teodora Medici, DO

## 2022-02-13 ENCOUNTER — Ambulatory Visit (INDEPENDENT_AMBULATORY_CARE_PROVIDER_SITE_OTHER): Payer: Medicare Other | Admitting: Internal Medicine

## 2022-02-13 ENCOUNTER — Encounter: Payer: Self-pay | Admitting: Internal Medicine

## 2022-02-13 VITALS — BP 132/76 | HR 76 | Temp 98.3°F | Resp 16 | Ht 61.0 in | Wt 143.6 lb

## 2022-02-13 DIAGNOSIS — I10 Essential (primary) hypertension: Secondary | ICD-10-CM | POA: Diagnosis not present

## 2022-02-13 DIAGNOSIS — K219 Gastro-esophageal reflux disease without esophagitis: Secondary | ICD-10-CM | POA: Diagnosis not present

## 2022-02-13 DIAGNOSIS — E785 Hyperlipidemia, unspecified: Secondary | ICD-10-CM | POA: Diagnosis not present

## 2022-02-13 DIAGNOSIS — F419 Anxiety disorder, unspecified: Secondary | ICD-10-CM

## 2022-02-13 MED ORDER — CITALOPRAM HYDROBROMIDE 20 MG PO TABS: ORAL_TABLET | ORAL | 1 refills | Status: DC

## 2022-02-13 MED ORDER — PANTOPRAZOLE SODIUM 40 MG PO TBEC: 40.0000 mg | DELAYED_RELEASE_TABLET | Freq: Every day | ORAL | 1 refills | Status: DC

## 2022-02-13 NOTE — Assessment & Plan Note (Signed)
Stable, continue Celexa 20 mg daily and Buspar 5 mg PRN. Refills sent today.

## 2022-02-13 NOTE — Assessment & Plan Note (Signed)
Stable, continue Crestor 40 mg, Plavix 75 mg and aspirin 81. Cannot afford Vascepa, will switch to over the counter fish oil. Plan to recheck at follow up.

## 2022-02-13 NOTE — Assessment & Plan Note (Signed)
Chronic and stable - blood pressure at goal today. Continue Lisinopril 40 mg, Metoprolol 100 mg TID, Amlodipine 10 mg, Imdur 30 mg and Lasix 20 mg about 5 times weekly. Continue monitoring blood pressure at home, follow up in 6 months.

## 2022-02-13 NOTE — Assessment & Plan Note (Signed)
Treat with Protonix 40 mg daily. At follow up plan to decrease dose as tolerated. Continue to avoid triggering foods and avoid laying flat 3-4 hours after eating.

## 2022-02-13 NOTE — Patient Instructions (Addendum)
It was great seeing you today!  Plan discussed at today's visit: -Refills sent  -New stomach medicine sent to pharmacy as well -Plan for labs next time, please be fasting 8-12 hours   Follow up in: 6 months   Take care and let us know if you have any questions or concerns prior to your next visit.  Dr. Caralee Ates   Gastroesophageal Reflux Disease, Adult  Gastroesophageal reflux (GER) happens when acid from the stomach flows up into the tube that connects the mouth and the stomach (esophagus). Normally, food travels down the esophagus and stays in the stomach to be digested. With GER, food and stomach acid sometimes move back up into the esophagus. You may have a disease called gastroesophageal reflux disease (GERD) if the reflux: Happens often. Causes frequent or very bad symptoms. Causes problems such as damage to the esophagus. When this happens, the esophagus becomes sore and swollen. Over time, GERD can make small holes (ulcers) in the lining of the esophagus. What are the causes? This condition is caused by a problem with the muscle between the esophagus and the stomach. When this muscle is weak or not normal, it does not close properly to keep food and acid from coming back up from the stomach. The muscle can be weak because of: Tobacco use. Pregnancy. Having a certain type of hernia (hiatal hernia). Alcohol use. Certain foods and drinks, such as coffee, chocolate, onions, and peppermint. What increases the risk? Being overweight. Having a disease that affects your connective tissue. Taking NSAIDs, such a ibuprofen. What are the signs or symptoms? Heartburn. Difficult or painful swallowing. The feeling of having a lump in the throat. A bitter taste in the mouth. Bad breath. Having a lot of saliva. Having an upset or bloated stomach. Burping. Chest pain. Different conditions can cause chest pain. Make sure you see your doctor if you have chest pain. Shortness of breath  or wheezing. A long-term cough or a cough at night. Wearing away of the surface of teeth (tooth enamel). Weight loss. How is this treated? Making changes to your diet. Taking medicine. Having surgery. Treatment will depend on how bad your symptoms are. Follow these instructions at home: Eating and drinking  Follow a diet as told by your doctor. You may need to avoid foods and drinks such as: Coffee and tea, with or without caffeine. Drinks that contain alcohol. Energy drinks and sports drinks. Bubbly (carbonated) drinks or sodas. Chocolate and cocoa. Peppermint and mint flavorings. Garlic and onions. Horseradish. Spicy and acidic foods. These include peppers, chili powder, curry powder, vinegar, hot sauces, and BBQ sauce. Citrus fruit juices and citrus fruits, such as oranges, lemons, and limes. Tomato-based foods. These include red sauce, chili, salsa, and pizza with red sauce. Fried and fatty foods. These include donuts, french fries, potato chips, and high-fat dressings. High-fat meats. These include hot dogs, rib eye steak, sausage, ham, and bacon. High-fat dairy items, such as whole milk, butter, and cream cheese. Eat small meals often. Avoid eating large meals. Avoid drinking large amounts of liquid with your meals. Avoid eating meals during the 2-3 hours before bedtime. Avoid lying down right after you eat. Do not exercise right after you eat. Lifestyle  Do not smoke or use any products that contain nicotine or tobacco. If you need help quitting, ask your doctor. Try to lower your stress. If you need help doing this, ask your doctor. If you are overweight, lose an amount of weight that is healthy for  you. Ask your doctor about a safe weight loss goal. General instructions Pay attention to any changes in your symptoms. Take over-the-counter and prescription medicines only as told by your doctor. Do not take aspirin, ibuprofen, or other NSAIDs unless your doctor says it  is okay. Wear loose clothes. Do not wear anything tight around your waist. Raise (elevate) the head of your bed about 6 inches (15 cm). You may need to use a wedge to do this. Avoid bending over if this makes your symptoms worse. Keep all follow-up visits. Contact a doctor if: You have new symptoms. You lose weight and you do not know why. You have trouble swallowing or it hurts to swallow. You have wheezing or a cough that keeps happening. You have a hoarse voice. Your symptoms do not get better with treatment. Get help right away if: You have sudden pain in your arms, neck, jaw, teeth, or back. You suddenly feel sweaty, dizzy, or light-headed. You have chest pain or shortness of breath. You vomit and the vomit is green, yellow, or black, or it looks like blood or coffee grounds. You faint. Your poop (stool) is red, bloody, or black. You cannot swallow, drink, or eat. These symptoms may represent a serious problem that is an emergency. Do not wait to see if the symptoms will go away. Get medical help right away. Call your local emergency services (911 in the [place] Do not drive yourself to the hospital. Summary If a person has gastroesophageal reflux disease (GERD), food and stomach acid move back up into the esophagus and cause symptoms or problems such as damage to the esophagus. Treatment will depend on how bad your symptoms are. Follow a diet as told by your doctor. Take all medicines only as told by your doctor. This information is not intended to replace advice given to you by your health care provider. Make sure you discuss any questions you have with your health care provider. Document Revised: 02/01/2020 Document Reviewed: 02/01/2020 [employer] Patient Education  2023 [employer].

## 2022-02-23 ENCOUNTER — Other Ambulatory Visit (INDEPENDENT_AMBULATORY_CARE_PROVIDER_SITE_OTHER): Payer: Self-pay | Admitting: Vascular Surgery

## 2022-02-23 DIAGNOSIS — I6523 Occlusion and stenosis of bilateral carotid arteries: Secondary | ICD-10-CM

## 2022-02-26 ENCOUNTER — Encounter (INDEPENDENT_AMBULATORY_CARE_PROVIDER_SITE_OTHER): Payer: Self-pay | Admitting: Nurse Practitioner

## 2022-02-26 ENCOUNTER — Ambulatory Visit (INDEPENDENT_AMBULATORY_CARE_PROVIDER_SITE_OTHER): Payer: Medicare Other | Admitting: Nurse Practitioner

## 2022-02-26 ENCOUNTER — Ambulatory Visit (INDEPENDENT_AMBULATORY_CARE_PROVIDER_SITE_OTHER): Payer: Medicare Other

## 2022-02-26 VITALS — BP 153/76 | HR 68 | Resp 16 | Ht 61.0 in | Wt 146.0 lb

## 2022-02-26 DIAGNOSIS — I7143 Infrarenal abdominal aortic aneurysm, without rupture: Secondary | ICD-10-CM

## 2022-02-26 DIAGNOSIS — E785 Hyperlipidemia, unspecified: Secondary | ICD-10-CM | POA: Diagnosis not present

## 2022-02-26 DIAGNOSIS — I10 Essential (primary) hypertension: Secondary | ICD-10-CM | POA: Diagnosis not present

## 2022-02-26 DIAGNOSIS — I6523 Occlusion and stenosis of bilateral carotid arteries: Secondary | ICD-10-CM

## 2022-02-26 DIAGNOSIS — I714 Abdominal aortic aneurysm, without rupture, unspecified: Secondary | ICD-10-CM

## 2022-02-27 ENCOUNTER — Other Ambulatory Visit (INDEPENDENT_AMBULATORY_CARE_PROVIDER_SITE_OTHER): Payer: Medicare Other

## 2022-02-27 ENCOUNTER — Encounter (INDEPENDENT_AMBULATORY_CARE_PROVIDER_SITE_OTHER): Payer: Medicare Other

## 2022-03-01 ENCOUNTER — Encounter: Payer: Self-pay | Admitting: Student in an Organized Health Care Education/Training Program

## 2022-03-01 ENCOUNTER — Ambulatory Visit
Payer: Medicare Other | Attending: Student in an Organized Health Care Education/Training Program | Admitting: Student in an Organized Health Care Education/Training Program

## 2022-03-01 VITALS — BP 136/81 | HR 59 | Temp 97.0°F | Resp 16 | Ht 61.0 in | Wt 144.0 lb

## 2022-03-01 DIAGNOSIS — I739 Peripheral vascular disease, unspecified: Secondary | ICD-10-CM | POA: Insufficient documentation

## 2022-03-01 DIAGNOSIS — I25118 Atherosclerotic heart disease of native coronary artery with other forms of angina pectoris: Secondary | ICD-10-CM | POA: Insufficient documentation

## 2022-03-01 DIAGNOSIS — G8929 Other chronic pain: Secondary | ICD-10-CM | POA: Diagnosis present

## 2022-03-01 DIAGNOSIS — Z9889 Other specified postprocedural states: Secondary | ICD-10-CM | POA: Diagnosis present

## 2022-03-01 DIAGNOSIS — G8921 Chronic pain due to trauma: Secondary | ICD-10-CM | POA: Diagnosis not present

## 2022-03-01 DIAGNOSIS — M25552 Pain in left hip: Secondary | ICD-10-CM | POA: Insufficient documentation

## 2022-03-01 DIAGNOSIS — M5442 Lumbago with sciatica, left side: Secondary | ICD-10-CM | POA: Insufficient documentation

## 2022-03-01 DIAGNOSIS — Z79891 Long term (current) use of opiate analgesic: Secondary | ICD-10-CM | POA: Insufficient documentation

## 2022-03-01 DIAGNOSIS — M1652 Unilateral post-traumatic osteoarthritis, left hip: Secondary | ICD-10-CM | POA: Diagnosis present

## 2022-03-01 MED ORDER — HYDROCODONE-ACETAMINOPHEN 10-325 MG PO TABS: 1.0000 | ORAL_TABLET | ORAL | 0 refills | Status: DC | PRN
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
## 2022-03-01 NOTE — Progress Notes (Signed)
Nursing Pain Medication Assessment:  Safety precautions to be maintained throughout the outpatient stay will include: orient to surroundings, keep bed in low position, maintain call bell within reach at all times, provide assistance with transfer out of bed and ambulation.  Medication Inspection Compliance: Pill count conducted under aseptic conditions, in front of the patient. Neither the pills nor the bottle was removed from the patient's sight at any time. Once count was completed pills were immediately returned to the patient in their original bottle.  Medication: Hydrocodone/APAP Pill/Patch Count:  121 of 180 pills remain Pill/Patch Appearance: Markings consistent with prescribed medication Bottle Appearance: Standard pharmacy container. Clearly labeled. Filled Date: 07 / 17 / 2023 Last Medication intake:  Today

## 2022-03-02 ENCOUNTER — Other Ambulatory Visit: Payer: Self-pay | Admitting: Family Medicine

## 2022-03-18 ENCOUNTER — Encounter (INDEPENDENT_AMBULATORY_CARE_PROVIDER_SITE_OTHER): Payer: Self-pay | Admitting: Nurse Practitioner

## 2022-03-18 NOTE — Progress Notes (Signed)
Subjective:    Patient ID: Elizabeth [name], female    DOB: [birth year], 81 y.o.   MRN: [id number] Chief Complaint  Patient presents with   Follow-up    ultrasound    Elizabeth [name] is a 81 year old female that presents today for follow-up evaluation of her carotid disease as well as abdominal aortic aneurysm.  Currently she denies any TIA or CVA-like symptoms.  She denies any amaurosis fugax.  There is no abdominal pain or signs symptoms of distal embolization.  She also has a history of lower extremity revascularization.  She denies any current claudication-like symptoms.  Today the right ICA has a 1 to 39% stenosis of the left having an 80 to 99% stenosis.  The bilateral vertebral arteries have antegrade flow with normal flow hemodynamics in the bilateral subclavian arteries.  Today her abdominal aortic aneurysm measures 4.88 cm in maximal diameter.  This is slightly increased from previous diameter measurement of 4.7 cm.  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    Review of Systems  Eyes:  Negative for visual disturbance.  All other systems reviewed and are negative.      Objective:   Physical Exam Vitals reviewed.  HENT:     Head: Normocephalic.  Neck:     Vascular: No carotid bruit.  Cardiovascular:     Rate and Rhythm: Normal rate.     Pulses: Normal pulses.  Pulmonary:     Effort: Pulmonary effort is normal.  Skin:    General: Skin is warm and dry.  Neurological:     Mental Status: She is alert and oriented to person, place, and time.  Psychiatric:        Mood and Affect: Mood normal.        Behavior: Behavior normal.        Thought Content: Thought content normal.        Judgment: Judgment normal.     BP (!) 153/76 (BP Location: Right Arm)   Pulse 68   Resp 16   Ht 5\' 1"  (1.549 m)   Wt 146 lb (66.2 kg)   BMI 27.59 kg/m   Past Medical History:  Diagnosis Date   3-vessel CAD    05/2018 LHC with a single remaining conduit which is the  left main coronary artery supplying the LAD.  Both the circumflex and right coronary artery were occluded.  The distal left main, proximal LAD is aneurysmal and heavily calcified.  The LAD supplies well-formed collaterals to the PDA.  Not felt to be candidate for revascularization and with recommendation for medical mgmt   AAA (abdominal aortic aneurysm) (HCC)    4.3cm (12/2018)   Celiac artery stenosis (HCC)    Degenerative joint disease of left hip    Heart failure with preserved ejection fraction (HCC)    EF 60-65%, 05/2018   History of non-ST elevation myocardial infarction (NSTEMI)    05/2018 with subsequent LHC and in the setting of acute aortic ulcer and hematoma   Hyperlipidemia    Hypertension    Internal carotid artery stenosis, left    80-99% 05/2018   Intramural hematoma of thoracic aorta (HCC)    05/2018   Mitral regurgitation    Mild by 05/2018 echo   Paroxysmal SVT (supraventricular tachycardia) (HCC)    05/2018   Peripheral vascular disease (HCC)    extensive vascular dz s/p b/l carotid endarterectomies (2014) and iliac stenting, AAA, aortic ulcer   Right renal artery  stenosis (HCC)    05/2018   Smoking hx    Quit ~1992   Status post bilateral carotid endarterectomy    Followed by Vascular surgery with most recent images 05/2018 showing L ICA 80-99% stenosis   Subclavian artery stenosis, left (HCC)    05/2018    Social History   Socioeconomic History   Marital status: Widowed    Spouse name: Not on file   Number of children: 2   Years of education: Not on file   Highest education level: GED or equivalent  Occupational History   Occupation: diabled  Tobacco Use   Smoking status: Former    Types: Cigarettes    Quit date: 02/02/1999    Years since quitting: 23.1   Smokeless tobacco: Never  Vaping Use   Vaping Use: Never used  Substance and Sexual Activity   Alcohol use: No   Drug use: No   Sexual activity: Not Currently  Other Topics Concern   Not on  file  Social History Narrative   Pt lives alone, does not drive but sister drives her when needed.    Social Determinants of Health   Financial Resource Strain: Low Risk  (04/27/2021)   Overall Financial Resource Strain (CARDIA)    Difficulty of Paying Living Expenses: Not hard at all  Food Insecurity: No Food Insecurity (04/27/2021)   Hunger Vital Sign    Worried About Running Out of Food in the Last Year: Never true    Ran Out of Food in the Last Year: Never true  Transportation Needs: No Transportation Needs (04/27/2021)   PRAPARE - Administrator, Civil Service (Medical): No    Lack of Transportation (Non-Medical): No  Physical Activity: Inactive (04/27/2021)   Exercise Vital Sign    Days of Exercise per Week: 0 days    Minutes of Exercise per Session: 0 min  Stress: No Stress Concern Present (04/27/2021)   [employer] of Occupational Health - Occupational Stress Questionnaire    Feeling of Stress : Only a little  Social Connections: Moderately Isolated (04/27/2021)   Social Connection and Isolation Panel [NHANES]    Frequency of Communication with Friends and Family: More than three times a week    Frequency of Social Gatherings with Friends and Family: More than three times a week    Attends Religious Services: More than 4 times per year    Active Member of [employer] or Organizations: No    Attends Banker Meetings: Never    Marital Status: Widowed  Intimate Partner Violence: Not At Risk (04/27/2021)   Humiliation, Afraid, Rape, and Kick questionnaire    Fear of Current or Ex-Partner: No    Emotionally Abused: No    Physically Abused: No    Sexually Abused: No    Past Surgical History:  Procedure Laterality Date   APPENDECTOMY     CARDIAC CATHETERIZATION     HIP SURGERY Left    x3   LEFT HEART CATH AND CORONARY ANGIOGRAPHY N/A 05/19/2018   Procedure: LEFT HEART CATH AND CORONARY ANGIOGRAPHY;  Surgeon: Lyn Records, MD;  Location: [facility] INVASIVE  CV [facility];  Service: Cardiovascular;  Laterality: N/A;   PERIPHERAL VASCULAR CATHETERIZATION Right 02/02/2016   Procedure: Lower Extremity Angiography;  Surgeon: Annice Needy, MD;  Location: [facility] INVASIVE CV LAB;  Service: Cardiovascular;  Laterality: Right;    Family History  Problem Relation Age of Onset   Hypertension Mother    Stroke Mother  Hypertension Father    Stroke Father    Hypertension Sister    Hyperlipidemia Sister    Hodgkin's lymphoma Son    Heart disease Son    Kidney disease Son    Mental illness Neg Hx     Allergies  Allergen Reactions   Carvedilol Other (See Comments)    intolerance   Codeine Other (See Comments)    Reaction: Unsure   Hydralazine Other (See Comments)    intolerance   Sulfa Antibiotics Other (See Comments)    Mouth blisters       Latest Ref Rng & Units 08/16/2021   10:01 AM 02/08/2021   11:22 AM 08/04/2020   11:55 AM  CBC  WBC 3.8 - 10.8 Thousand/uL 6.4  6.1  6.2   Hemoglobin 11.7 - 15.5 g/dL 71.2  45.8  09.9   Hematocrit 35.0 - 45.0 % 42.9  44.9  44.0   Platelets 140 - 400 Thousand/uL 221  202  196       CMP     Component Value Date/Time   NA 140 08/16/2021 1001   NA 144 05/29/2021 1349   NA 137 10/02/2012 0608   K 4.7 08/16/2021 1001   K 3.5 10/02/2012 0608   CL 103 08/16/2021 1001   CL 105 10/02/2012 0608   CO2 32 08/16/2021 1001   CO2 24 10/02/2012 0608   GLUCOSE 102 (H) 08/16/2021 1001   GLUCOSE 124 (H) 10/02/2012 0608   BUN 31 (H) 08/16/2021 1001   BUN 32 (H) 05/29/2021 1349   BUN 8 10/02/2012 0608   CREATININE 1.10 (H) 08/16/2021 1001   CALCIUM 9.6 08/16/2021 1001   CALCIUM 8.7 10/02/2012 0608   PROT 7.3 08/16/2021 1001   ALBUMIN 2.8 (L) 05/21/2018 0430   AST 21 08/16/2021 1001   ALT 13 08/16/2021 1001   ALKPHOS 62 05/18/2018 0617   BILITOT 0.4 08/16/2021 1001   GFRNONAA 56 (L) 10/25/2020 1007   GFRAA 65 10/25/2020 1007     No results found.     Assessment & Plan:   1. Bilateral carotid artery  stenosis Duplex today shows 1-39% right ICA stenosis and 80 to 99% left ICA stenosis.  She has now had 3 duplex studies that have suggested high-grade lesions in the past couple of years.   At this point, with her medical comorbidities general anesthesia for carotid endarterectomy would be suboptimal.  I discussed the role of carotid angiogram with expected stenting.  She does not want to have this done and says that she understands her stroke risk is significantly higher with medical management alone but is not interested in having any procedures.  I will plan to see her back with her follow-up studies for her aneurysm and her carotid in 6 months, but if she reconsiders I would be happy to offer her carotid angiogram and possible stenting.  2. Abdominal aortic aneurysm (AAA) without rupture, unspecified part (HCC) Recommend: No surgery or intervention is indicated at this time.  The patient has an asymptomatic abdominal aortic aneurysm that is greater than 4 cm but less than 5 cm in maximal diameter.    I have reviewed the natural history of abdominal aortic aneurysm and the small risk of rupture for aneurysm less than 5 cm in size.  However, as these small aneurysms tend to enlarge over time, continued surveillance with ultrasound or CT scan is mandatory.   I have also discussed optimizing medical management with hypertension and lipid control and the importance of  abstinence from tobacco.  The patient is also encouraged to exercise a minimum of 30 minutes 4 times a week.   Should the patient develop new onset abdominal or back pain or signs of peripheral embolization they are instructed to seek medical attention immediately and to alert the physician providing care that they have an aneurysm.   The patient voices their understanding.  I have scheduled the patient to return in 6 months with an aortic duplex.   3. Essential hypertension Continue antihypertensive medications as already ordered,  these medications have been reviewed and there are no changes at this time.   4. Hyperlipidemia, unspecified hyperlipidemia type Continue statin as ordered and reviewed, no changes at this time    Current Outpatient Medications on File Prior to Visit  Medication Sig Dispense Refill   amLODipine (NORVASC) 10 MG tablet TAKE 1 TABLET BY MOUTH EVERY DAY 90 tablet 1   aspirin EC 81 MG tablet Take 1 tablet (81 mg total) by mouth daily. Swallow whole. 90 tablet 3   busPIRone (BUSPAR) 5 MG tablet TAKE 1-2 TABLETS (5-10 MG TOTAL) BY MOUTH 2 (TWO) TIMES DAILY AS NEEDED (ANXIETY/NERVES/PANIC). 360 tablet 1   cholecalciferol (VITAMIN D) 1000 units tablet Take 1,000 Units by mouth daily.     citalopram (CELEXA) 20 MG tablet TAKE 1 TABLET BY MOUTH EVERYDAY AT BEDTIME 90 tablet 1   clopidogrel (PLAVIX) 75 MG tablet Take 1 tablet (75 mg total) by mouth daily. 90 tablet 1   Cyanocobalamin (VITAMIN B-12 PO) Take 1 tablet by mouth daily.     furosemide (LASIX) 20 MG tablet Take 1 tablet (20 mg total) by mouth daily. 90 tablet 2   glucosamine-chondroitin 500-400 MG tablet Take 1 tablet by mouth daily.     isosorbide mononitrate (IMDUR) 30 MG 24 hr tablet TAKE 1/2 OF A TABLET (15 MG TOTAL) BY MOUTH DAILY 45 tablet 3   lisinopril (ZESTRIL) 40 MG tablet TAKE 1 TABLET BY MOUTH EVERY DAY 90 tablet 3   METRONIDAZOLE, TOPICAL, 0.75 % LOTN APPLY 1 APPLICATION. TOPICALLY 2 (TWO) TIMES DAILY. AFTER WASHING. 59 mL 0   Multiple Vitamin (MULTIVITAMIN) tablet Take 1 tablet by mouth daily.     pantoprazole (PROTONIX) 40 MG tablet Take 1 tablet (40 mg total) by mouth daily. 90 tablet 1   potassium citrate (UROCIT-K) 10 MEQ (1080 MG) SR tablet TAKE 1 TABLET BY MOUTH TWICE A DAY 60 tablet 2   rosuvastatin (CRESTOR) 40 MG tablet Take 1 tablet (40 mg total) by mouth daily. 90 tablet 3   metoprolol tartrate (LOPRESSOR) 100 MG tablet Take 1 tablet (100 mg total) by mouth 3 (three) times daily. 270 tablet 2   nitroGLYCERIN  (NITROSTAT) 0.4 MG SL tablet Place 1 tablet (0.4 mg total) under the tongue every 5 (five) minutes as needed for chest pain. 25 tablet 3   No current facility-administered medications on file prior to visit.    There are no Patient Instructions on file for this visit. No follow-ups on file.   Georgiana Spinner, NP

## 2022-04-07 ENCOUNTER — Other Ambulatory Visit: Payer: Self-pay | Admitting: Family Medicine

## 2022-04-10 ENCOUNTER — Other Ambulatory Visit: Payer: Self-pay

## 2022-04-10 MED ORDER — POTASSIUM CITRATE ER 10 MEQ (1080 MG) PO TBCR: 10.0000 meq | EXTENDED_RELEASE_TABLET | Freq: Two times a day (BID) | ORAL | 2 refills | Status: DC

## 2022-04-11 ENCOUNTER — Other Ambulatory Visit: Payer: Self-pay | Admitting: Family Medicine

## 2022-04-11 DIAGNOSIS — I739 Peripheral vascular disease, unspecified: Secondary | ICD-10-CM

## 2022-04-12 NOTE — Telephone Encounter (Signed)
Requested Prescriptions  Pending Prescriptions Disp Refills  . clopidogrel (PLAVIX) 75 MG tablet [Pharmacy Med Name: CLOPIDOGREL 75 MG TABLET] 90 tablet 1    Sig: TAKE 1 TABLET BY MOUTH EVERY DAY     Hematology: Antiplatelets - clopidogrel Failed - 04/11/2022  2:35 PM      Failed - HCT in normal range and within 180 days    HCT  Date Value Ref Range Status  08/16/2021 42.9 35.0 - 45.0 % Final  10/02/2012 44.6 35.0 - 47.0 % Final         Failed - HGB in normal range and within 180 days    Hemoglobin  Date Value Ref Range Status  08/16/2021 14.6 11.7 - 15.5 g/dL Final   HGB  Date Value Ref Range Status  10/02/2012 15.3 12.0 - 16.0 g/dL Final         Failed - PLT in normal range and within 180 days    Platelets  Date Value Ref Range Status  08/16/2021 221 140 - 400 Thousand/uL Final   Platelet  Date Value Ref Range Status  10/02/2012 262 150 - 440 x10 3/mm 3 Final         Failed - Cr in normal range and within 360 days    Creat  Date Value Ref Range Status  08/16/2021 1.10 (H) 0.60 - 1.00 mg/dL Final         Passed - Valid encounter within last 6 months    Recent Outpatient Visits          1 month ago Essential hypertension   [facility] Margarita Mail, DO   5 months ago Essential hypertension   [facility] Caro Laroche, DO   5 months ago Essential hypertension   [facility] Caro Laroche, DO   6 months ago Facial cellulitis   [facility] Margarita Mail, DO   6 months ago Facial cellulitis   [facility] Margarita Mail, DO      Future Appointments            In 2 weeks  [facility]   In 4 months Margarita Mail, DO [facility], [facility]

## 2022-05-01 ENCOUNTER — Ambulatory Visit (INDEPENDENT_AMBULATORY_CARE_PROVIDER_SITE_OTHER): Payer: Medicare Other

## 2022-05-01 ENCOUNTER — Encounter: Payer: Self-pay | Admitting: Family Medicine

## 2022-05-01 ENCOUNTER — Ambulatory Visit (INDEPENDENT_AMBULATORY_CARE_PROVIDER_SITE_OTHER): Payer: Medicare Other | Admitting: Family Medicine

## 2022-05-01 VITALS — BP 124/78 | HR 73 | Temp 98.2°F | Resp 16 | Ht 61.0 in | Wt 148.0 lb

## 2022-05-01 DIAGNOSIS — Z0000 Encounter for general adult medical examination without abnormal findings: Secondary | ICD-10-CM

## 2022-05-01 DIAGNOSIS — R600 Localized edema: Secondary | ICD-10-CM

## 2022-05-01 DIAGNOSIS — Z23 Encounter for immunization: Secondary | ICD-10-CM | POA: Diagnosis not present

## 2022-05-01 DIAGNOSIS — M25572 Pain in left ankle and joints of left foot: Secondary | ICD-10-CM | POA: Diagnosis not present

## 2022-05-01 MED ORDER — PREDNISONE 20 MG PO TABS: 40.0000 mg | ORAL_TABLET | Freq: Every day | ORAL | 0 refills | Status: AC

## 2022-05-01 NOTE — Progress Notes (Signed)
Subjective:   Elizabeth [name] is a 81 y.o. female who presents for Medicare Annual (Subsequent) preventive examination.  Review of Systems    Defer to PCP Cardiac Risk Factors include: advanced age (>[id number], 37>65 women)     Objective:    Today's Vitals   05/01/22 1306 05/01/22 1317  BP: 124/78   Pulse: 73   Resp: 16   Temp: 98.2 F (36.8 C)   TempSrc: Oral   SpO2: 94%   Weight: 148 lb (67.1 kg)   Height: 5\' 1"  (1.549 m)   PainSc: 6  6   PainLoc: Leg    Body mass index is 27.96 kg/m.     05/01/2022    1:22 PM 03/01/2022    2:02 PM 12/07/2021    1:31 PM 09/12/2021   10:38 AM 06/13/2021   10:49 AM 04/27/2021    1:54 PM 03/16/2021   10:29 AM  Advanced Directives  Does Patient Have a Medical Advance Directive? No No No No No No No  Does patient want to make changes to medical advance directive?      No - Patient declined   Would patient like information on creating a medical advance directive? No - Patient declined No - Patient declined  No - Patient declined   No - Patient declined    Current Medications (verified) Outpatient Encounter Medications as of 05/01/2022  Medication Sig   amLODipine (NORVASC) 10 MG tablet TAKE 1 TABLET BY MOUTH EVERY DAY   aspirin EC 81 MG tablet Take 1 tablet (81 mg total) by mouth daily. Swallow whole.   busPIRone (BUSPAR) 5 MG tablet TAKE 1-2 TABLETS (5-10 MG TOTAL) BY MOUTH 2 (TWO) TIMES DAILY AS NEEDED (ANXIETY/NERVES/PANIC).   cholecalciferol (VITAMIN D) 1000 units tablet Take 1,000 Units by mouth daily.   citalopram (CELEXA) 20 MG tablet TAKE 1 TABLET BY MOUTH EVERYDAY AT BEDTIME   clopidogrel (PLAVIX) 75 MG tablet TAKE 1 TABLET BY MOUTH EVERY DAY   Cyanocobalamin (VITAMIN B-12 PO) Take 1 tablet by mouth daily.   furosemide (LASIX) 20 MG tablet Take 1 tablet (20 mg total) by mouth daily.   glucosamine-chondroitin 500-400 MG tablet Take 1 tablet by mouth daily.   HYDROcodone-acetaminophen (NORCO) 10-325 MG tablet Take 1 tablet by mouth every  4 (four) hours as needed for severe pain. Must last 30 days.   [START ON 05/20/2022] HYDROcodone-acetaminophen (NORCO) 10-325 MG tablet Take 1 tablet by mouth every 4 (four) hours as needed for severe pain. Must last 30 days.   isosorbide mononitrate (IMDUR) 30 MG 24 hr tablet TAKE 1/2 OF A TABLET (15 MG TOTAL) BY MOUTH DAILY   lisinopril (ZESTRIL) 40 MG tablet TAKE 1 TABLET BY MOUTH EVERY DAY   METRONIDAZOLE, TOPICAL, 0.75 % LOTN APPLY 1 APPLICATION. TOPICALLY 2 (TWO) TIMES DAILY. AFTER WASHING.   Multiple Vitamin (MULTIVITAMIN) tablet Take 1 tablet by mouth daily.   pantoprazole (PROTONIX) 40 MG tablet Take 1 tablet (40 mg total) by mouth daily.   potassium citrate (UROCIT-K) 10 MEQ (1080 MG) SR tablet Take 1 tablet (10 mEq total) by mouth 2 (two) times daily.   rosuvastatin (CRESTOR) 40 MG tablet Take 1 tablet (40 mg total) by mouth daily.   HYDROcodone-acetaminophen (NORCO) 10-325 MG tablet Take 1 tablet by mouth every 4 (four) hours as needed for severe pain. Must last 30 days.   metoprolol tartrate (LOPRESSOR) 100 MG tablet Take 1 tablet (100 mg total) by mouth 3 (three) times daily.   nitroGLYCERIN (NITROSTAT)  0.4 MG SL tablet Place 1 tablet (0.4 mg total) under the tongue every 5 (five) minutes as needed for chest pain.   No facility-administered encounter medications on file as of 05/01/2022.    Allergies (verified) Carvedilol, Codeine, Hydralazine, and Sulfa antibiotics   History: Past Medical History:  Diagnosis Date   3-vessel CAD    05/2018 LHC with a single remaining conduit which is the left main coronary artery supplying the LAD.  Both the circumflex and right coronary artery were occluded.  The distal left main, proximal LAD is aneurysmal and heavily calcified.  The LAD supplies well-formed collaterals to the PDA.  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[place])    05/2018   Mitral regurgitation    Mild by 05/2018 echo   Paroxysmal SVT (supraventricular tachycardia) ([place])    05/2018   Peripheral vascular disease ([place])    extensive vascular dz s/p b/l carotid endarterectomies (2014) and iliac stenting, AAA, aortic ulcer   Right renal artery stenosis ([place])    05/2018   Smoking hx    Quit ~1992   Status post bilateral carotid endarterectomy    Followed by Vascular surgery with most recent images 05/2018 showing L ICA 80-99% stenosis   Subclavian artery stenosis, left ([place])    05/2018   Past Surgical History:  Procedure Laterality Date   APPENDECTOMY     CARDIAC CATHETERIZATION     HIP SURGERY Left    x3   LEFT HEART CATH AND CORONARY ANGIOGRAPHY N/A 05/19/2018   Procedure: LEFT HEART CATH AND CORONARY ANGIOGRAPHY;  Surgeon: Belva Crome, MD;  Location: [facility];  Service: Cardiovascular;  Laterality: N/A;   PERIPHERAL VASCULAR CATHETERIZATION Right 02/02/2016   Procedure: Lower Extremity Angiography;  Surgeon: Algernon Huxley, MD;  Location: [facility];  Service: Cardiovascular;  Laterality: Right;   Family History  Problem Relation Age of Onset   Hypertension Mother    Stroke Mother    Hypertension Father    Stroke Father    Hypertension Sister    Hyperlipidemia Sister    Hodgkin's lymphoma Son    Heart disease Son    Kidney disease Son    Mental illness Neg Hx    Social History   Socioeconomic History   Marital status: Widowed    Spouse name: Not on file   Number of children: 2   Years of  education: Not on file   Highest education level: GED or equivalent  Occupational History   Occupation: diabled  Tobacco Use   Smoking status: Former    Types: Cigarettes    Quit date: 02/02/1999    Years since quitting: 23.2   Smokeless tobacco: Never  Vaping Use   Vaping Use: Never used  Substance and Sexual Activity   Alcohol use: No   Drug use: No   Sexual activity: Not Currently  Other  Topics Concern   Not on file  Social History Narrative   Pt lives alone, does not drive but sister drives her when needed.    Social Determinants of Health   Financial Resource Strain: Low Risk  (05/01/2022)   Overall Financial Resource Strain (CARDIA)    Difficulty of Paying Living Expenses: Not hard at all  Food Insecurity: No Food Insecurity (05/01/2022)   Hunger Vital Sign    Worried About Running Out of Food in the Last Year: Never true    Ran Out of Food in the Last Year: Never true  Transportation Needs: No Transportation Needs (05/01/2022)   PRAPARE - Administrator, Civil Service (Medical): No    Lack of Transportation (Non-Medical): No  Physical Activity: Inactive (05/01/2022)   Exercise Vital Sign    Days of Exercise per Week: 0 days    Minutes of Exercise per Session: 0 min  Stress: No Stress Concern Present (05/01/2022)   [employer] of Occupational Health - Occupational Stress Questionnaire    Feeling of Stress : Not at all  Social Connections: Moderately Integrated (05/01/2022)   Social Connection and Isolation Panel [NHANES]    Frequency of Communication with Friends and Family: More than three times a week    Frequency of Social Gatherings with Friends and Family: More than three times a week    Attends Religious Services: More than 4 times per year    Active Member of [employer] or Organizations: Yes    Attends Banker Meetings: More than 4 times per year    Marital Status: Widowed    Tobacco Counseling Counseling given: Not  Answered   Clinical Intake:  Pre-visit preparation completed: Yes  Pain : 0-10 Pain Score: 6  Pain Type: Chronic pain Pain Location: Leg Pain Orientation: Right, Left (gout flare up) Pain Descriptors / Indicators: Aching, Burning Pain Onset: In the past 7 days Pain Frequency: Rarely Pain Relieving Factors: medication/cherry juice Effect of Pain on Daily Activities: 0  Pain Relieving Factors: medication/cherry juice  BMI - recorded: 27.96 Nutritional Status: BMI 25 -29 Overweight Nutritional Risks: None Diabetes: No  How often do you need to have someone help you when you read instructions, pamphlets, or other written materials from your doctor or pharmacy?: 1 - Never What is the last grade level you completed in school?: 8/GED  Diabetic?no  Interpreter Needed?: No  Information entered by :: Freada Bergeron, CMA   Activities of Daily Living    05/01/2022    1:22 PM 02/13/2022    1:06 PM  In your present state of health, do you have any difficulty performing the following activities:  Hearing? 0 0  Vision? 0 0  Difficulty concentrating or making decisions? 0 0  Walking or climbing stairs? 1 1  Dressing or bathing? 0 0  Doing errands, shopping? 0 0  Preparing Food and eating ? N   Using the Toilet? N   In the past six months, have you accidently leaked urine? N   Do you have problems with loss of bowel control? N   Managing your Medications? N   Managing your Finances? N   Housekeeping or managing your Housekeeping? N     Patient Care Team: Margarita Mail, DO as PCP - General (Internal Medicine) Debbe Odea, MD as PCP - Cardiology (Cardiology)  Indicate any recent Medical Services you may have received from other than [facility] providers in the past year (date may be approximate).  Assessment:   This is a routine wellness examination for Mry.  Hearing/Vision screen No results found.  Dietary issues and exercise activities discussed: Current  Exercise Habits: The patient does not participate in regular exercise at present, Exercise limited by: orthopedic condition(s)   Goals Addressed   None   Depression Screen    05/01/2022    1:20 PM 03/01/2022    2:02 PM 02/13/2022    1:06 PM 12/07/2021    1:31 PM 11/13/2021    3:24 PM 10/30/2021   12:57 PM 10/02/2021    2:39 PM  PHQ 2/9 Scores  PHQ - 2 Score 0 0 0 0 0 0 0  PHQ- 9 Score   0  0 0 0    Fall Risk    05/01/2022    1:22 PM 03/01/2022    2:02 PM 02/13/2022    1:06 PM 12/07/2021    1:31 PM 11/13/2021    3:24 PM  Fall Risk   Falls in the past year? 0 0 0 0 0  Number falls in past yr:   0  0  Injury with Fall?   0  0  Risk for fall due to : Impaired balance/gait    No Fall Risks  Follow up Falls evaluation completed;Education provided;Falls prevention discussed    Education provided;Falls prevention discussed    FALL RISK PREVENTION PERTAINING TO THE HOME:  Any stairs in or around the home? No  If so, are there any without handrails? No  Home free of loose throw rugs in walkways, pet beds, electrical cords, etc? Yes  Adequate lighting in your home to reduce risk of falls? Yes   ASSISTIVE DEVICES UTILIZED TO PREVENT FALLS:  Life alert? No  Use of a cane, walker or w/c? Yes  Grab bars in the bathroom? Yes  Shower chair or bench in shower? Yes  Elevated toilet seat or a handicapped toilet? Yes   TIMED UP AND GO:  Was the test performed? Yes .  Length of time to ambulate 10 feet: 5 sec.   Gait steady and fast without use of assistive device  Cognitive Function:        05/01/2022    1:23 PM 04/26/2020    2:04 PM  6CIT Screen  What Year? 0 points 0 points  What month? 0 points 0 points  What time? 0 points 0 points  Count back from 20 0 points 0 points  Months in reverse 0 points 0 points  Repeat phrase 0 points 0 points  Total Score 0 points 0 points    Immunizations Immunization History  Administered Date(s) Administered   Fluad Quad(high Dose 65+)  04/28/2020, 04/27/2021   Influenza, High Dose Seasonal PF 05/01/2017, 04/08/2018   Influenza-Unspecified 05/06/2014, 04/22/2019   PFIZER Comirnaty(Gray Top)Covid-19 Tri-Sucrose Vaccine 05/03/2020, 11/07/2020   PFIZER(Purple Top)SARS-COV-2 Vaccination 08/12/2019, 09/02/2019   PNEUMOCOCCAL CONJUGATE-20 08/14/2021   Pfizer Covid-19 Vaccine Bivalent Booster 59yrs & up 06/07/2021, 06/07/2021     Flu Vaccine status: Completed at today's visit  Pneumococcal vaccine status: Up to date  Covid-19 vaccine status: Completed vaccines  Qualifies for Shingles Vaccine? Yes   Zostavax completed No   Shingrix Completed?: No.    Education has been provided regarding the importance of this vaccine. Patient has been advised to call insurance company to determine out of pocket expense if they have not yet received this vaccine. Advised may also receive vaccine at local pharmacy or Health Dept. Verbalized acceptance and understanding.  Screening Tests Health  Maintenance  Topic Date Due   Zoster Vaccines- Shingrix (1 of 2) Never done   INFLUENZA VACCINE  03/06/2022   COVID-19 Vaccine (6 - Pfizer series) 05/17/2022 (Originally 10/05/2021)   DEXA SCAN  09/25/2022 (Originally 08/30/2007)   Pneumonia Vaccine 51+ Years old  Completed   Hepatitis C Screening  Completed   HPV VACCINES  Aged Out   TETANUS/TDAP  Discontinued    Health Maintenance  Health Maintenance Due  Topic Date Due   Zoster Vaccines- Shingrix (1 of 2) Never done   INFLUENZA VACCINE  03/06/2022    Colorectal cancer screening: No longer required.   Mammogram status: No longer required due to age.  Bone Density status: Ordered 05/01/2022. Pt provided with contact info and advised to call to schedule appt.  Lung Cancer Screening: (Low Dose CT Chest recommended if Age 64-80 years, 30 pack-year currently smoking OR have quit w/in 15years.) does not qualify.   Lung Cancer Screening Referral: n/a  Additional Screening:  Hepatitis C  Screening: does not qualify; Completed 08/16/2021  Vision Screening: Recommended annual ophthalmology exams for early detection of glaucoma and other disorders of the eye. Is the patient up to date with their annual eye exam?  No  Who is the provider or what is the name of the office in which the patient attends annual eye exams? Encouraged patient to schedule If pt is not established with a provider, would they like to be referred to a provider to establish care? No .   Dental Screening: Recommended annual dental exams for proper oral hygiene  Community Resource Referral / Chronic Care Management: CRR required this visit?  No   CCM required this visit?  No      Plan:     I have personally reviewed and noted the following in the patient's chart:   Medical and social history Use of alcohol, tobacco or illicit drugs  Current medications and supplements including opioid prescriptions. Patient is currently taking opioid prescriptions. Information provided to patient regarding non-opioid alternatives. Patient advised to discuss non-opioid treatment plan with their provider. Functional ability and status Nutritional status Physical activity Advanced directives List of other physicians Hospitalizations, surgeries, and ER visits in previous 12 months Vitals Screenings to include cognitive, depression, and falls Referrals and appointments  In addition, I have reviewed and discussed with patient certain preventive protocols, quality metrics, and best practice recommendations. A written personalized care plan for preventive services as well as general preventive health recommendations were provided to patient.     Benay Pike, CMA   05/01/2022   Nurse Notes:  Ms. Downard , Thank you for taking time to come for your Medicare Wellness Visit. I appreciate your ongoing commitment to your health goals. Please review the following plan we discussed and let me know if I can assist you in  the future.   These are the goals we discussed:  Goals      Prevent falls     Continue to prevent falls with balance and strength training.         This is a list of the screening recommended for you and due dates:  Health Maintenance  Topic Date Due   Zoster (Shingles) Vaccine (1 of 2) Never done   Flu Shot  03/06/2022   COVID-19 Vaccine (6 - Pfizer series) 05/17/2022*   DEXA scan (bone density measurement)  09/25/2022*   Pneumonia Vaccine  Completed   Hepatitis C Screening: USPSTF Recommendation to screen - Ages 102-79 yo.  Completed   HPV Vaccine  Aged Out   Tetanus Vaccine  Discontinued  *Topic was postponed. The date shown is not the original due date.
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
## 2022-05-02 LAB — COMPLETE METABOLIC PANEL WITH GFR
AG Ratio: 1.2 (calc) (ref 1.0–2.5)
ALT: 12 U/L (ref 6–29)
AST: 21 U/L (ref 10–35)
Albumin: 4.3 g/dL (ref 3.6–5.1)
Alkaline phosphatase (APISO): 91 U/L (ref 37–153)
BUN/Creatinine Ratio: 20 (calc) (ref 6–22)
BUN: 29 mg/dL — ABNORMAL HIGH (ref 7–25)
CO2: 30 mmol/L (ref 20–32)
Calcium: 9.6 mg/dL (ref 8.6–10.4)
Chloride: 102 mmol/L (ref 98–110)
Creat: 1.43 mg/dL — ABNORMAL HIGH (ref 0.60–1.00)
Globulin: 3.5 g/dL (calc) (ref 1.9–3.7)
Glucose, Bld: 80 mg/dL (ref 65–99)
Potassium: 5.1 mmol/L (ref 3.5–5.3)
Sodium: 138 mmol/L (ref 135–146)
Total Bilirubin: 0.4 mg/dL (ref 0.2–1.2)
Total Protein: 7.8 g/dL (ref 6.1–8.1)
eGFR: 37 mL/min/{1.73_m2} — ABNORMAL LOW (ref >=60)

## 2022-05-02 LAB — CBC WITH DIFFERENTIAL/PLATELET
Absolute Monocytes: 766 cells/uL (ref 200–950)
Basophils Absolute: 21 cells/uL (ref 0–200)
Basophils Relative: 0.3 %
Eosinophils Absolute: 248 cells/uL (ref 15–500)
Eosinophils Relative: 3.6 %
HCT: 41.5 % (ref 35.0–45.0)
Hemoglobin: 13.7 g/dL (ref 11.7–15.5)
Lymphs Abs: 2008 cells/uL (ref 850–3900)
MCH: 33.6 pg — ABNORMAL HIGH (ref 27.0–33.0)
MCHC: 33 g/dL (ref 32.0–36.0)
MCV: 101.7 fL — ABNORMAL HIGH (ref 80.0–100.0)
MPV: 11.5 fL (ref 7.5–12.5)
Monocytes Relative: 11.1 %
Neutro Abs: 3857 cells/uL (ref 1500–7800)
Neutrophils Relative %: 55.9 %
Platelets: 249 10*3/uL (ref 140–400)
RBC: 4.08 10*6/uL (ref 3.80–5.10)
RDW: 12.6 % (ref 11.0–15.0)
Total Lymphocyte: 29.1 %
WBC: 6.9 10*3/uL (ref 3.8–10.8)

## 2022-05-02 LAB — URIC ACID: Uric Acid, Serum: 6.9 mg/dL (ref 2.5–7.0)

## 2022-05-02 NOTE — Addendum Note (Signed)
Addended by: Delsa Grana on: 05/02/2022 04:10 PM   Modules accepted: Orders

## 2022-05-08 ENCOUNTER — Other Ambulatory Visit: Payer: Self-pay | Admitting: Internal Medicine

## 2022-05-08 DIAGNOSIS — F419 Anxiety disorder, unspecified: Secondary | ICD-10-CM

## 2022-05-09 NOTE — Telephone Encounter (Signed)
Requested Prescriptions  Pending Prescriptions Disp Refills  . busPIRone (BUSPAR) 5 MG tablet [Pharmacy Med Name: BUSPIRONE HCL 5 MG TABLET] 360 tablet 0    Sig: TAKE 1-2 TABLETS (5-10 MG TOTAL) BY MOUTH 2 (TWO) TIMES DAILY AS NEEDED (ANXIETY/NERVES/PANIC).     Psychiatry: Anxiolytics/Hypnotics - Non-controlled Passed - 05/08/2022  8:39 PM      Passed - Valid encounter within last 12 months    Recent Outpatient Visits          1 week ago Edema of left foot   [facility] Delsa Grana, PA-C   2 months ago Essential hypertension   [place], DO   5 months ago Essential hypertension   [place], [place], DO   6 months ago Essential hypertension   [place], DO   7 months ago Facial cellulitis   [facility] Teodora Medici, DO      Future Appointments            In 3 months Teodora Medici, [facility], [facility]

## 2022-05-14 ENCOUNTER — Telehealth: Payer: Self-pay | Admitting: Internal Medicine

## 2022-05-14 DIAGNOSIS — K219 Gastro-esophageal reflux disease without esophagitis: Secondary | ICD-10-CM

## 2022-05-15 NOTE — Telephone Encounter (Signed)
Unable to refill per protocol, last refill 02/13/22 for 90 and 1 RF. Request is too soon.E-Prescribing Status: Receipt confirmed by pharmacy (02/13/2022 1:23 PM). Will refuse.   Requested Prescriptions  Pending Prescriptions Disp Refills  . pantoprazole (PROTONIX) 40 MG tablet [Pharmacy Med Name: PANTOPRAZOLE SOD DR 40 MG TAB] 90 tablet 1    Sig: TAKE 1 TABLET BY MOUTH EVERY DAY     Gastroenterology: Proton Pump Inhibitors Passed - 05/14/2022  7:19 PM      Passed - Valid encounter within last 12 months    Recent Outpatient Visits          2 weeks ago Edema of left foot   [facility] Delsa Grana, PA-C   3 months ago Essential hypertension   [place], DO   6 months ago Essential hypertension   [place], [place], DO   6 months ago Essential hypertension   [place], DO   7 months ago Facial cellulitis   [facility] Teodora Medici, DO      Future Appointments            In 3 months Teodora Medici, [facility], [facility]

## 2022-05-21 ENCOUNTER — Other Ambulatory Visit: Payer: Self-pay | Admitting: *Deleted

## 2022-05-21 ENCOUNTER — Telehealth: Payer: Self-pay | Admitting: Student in an Organized Health Care Education/Training Program

## 2022-05-21 DIAGNOSIS — M1652 Unilateral post-traumatic osteoarthritis, left hip: Secondary | ICD-10-CM

## 2022-05-21 DIAGNOSIS — Z9889 Other specified postprocedural states: Secondary | ICD-10-CM

## 2022-05-21 DIAGNOSIS — G8921 Chronic pain due to trauma: Secondary | ICD-10-CM

## 2022-05-21 NOTE — Telephone Encounter (Signed)
PT stated that [facility] is out of Hydrocodone. PT will like refill to be send to [facility] in [place]. PT asked to be call when refill has been send in. Thanks

## 2022-05-21 NOTE — Telephone Encounter (Signed)
Rx request sent to Dr. Lateef 

## 2022-05-22 MED ORDER — HYDROCODONE-ACETAMINOPHEN 10-325 MG PO TABS: 1.0000 | ORAL_TABLET | ORAL | 0 refills | Status: DC | PRN

## 2022-05-22 NOTE — Telephone Encounter (Signed)
Patient notified

## 2022-06-04 ENCOUNTER — Encounter (INDEPENDENT_AMBULATORY_CARE_PROVIDER_SITE_OTHER): Payer: Self-pay

## 2022-06-05 ENCOUNTER — Ambulatory Visit
Payer: Medicare Other | Attending: Student in an Organized Health Care Education/Training Program | Admitting: Student in an Organized Health Care Education/Training Program

## 2022-06-05 ENCOUNTER — Encounter: Payer: Self-pay | Admitting: Student in an Organized Health Care Education/Training Program

## 2022-06-05 DIAGNOSIS — G8921 Chronic pain due to trauma: Secondary | ICD-10-CM | POA: Diagnosis not present

## 2022-06-05 DIAGNOSIS — Z9889 Other specified postprocedural states: Secondary | ICD-10-CM | POA: Diagnosis not present

## 2022-06-05 DIAGNOSIS — M1652 Unilateral post-traumatic osteoarthritis, left hip: Secondary | ICD-10-CM | POA: Insufficient documentation

## 2022-06-05 MED ORDER — HYDROCODONE-ACETAMINOPHEN 10-325 MG PO TABS: 1.0000 | ORAL_TABLET | ORAL | 0 refills | Status: DC | PRN
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
## 2022-06-05 NOTE — Progress Notes (Signed)
Nursing Pain Medication Assessment:  Safety precautions to be maintained throughout the outpatient stay will include: orient to surroundings, keep bed in low position, maintain call bell within reach at all times, provide assistance with transfer out of bed and ambulation.  Medication Inspection Compliance: Pill count conducted under aseptic conditions, in front of the patient. Neither the pills nor the bottle was removed from the patient's sight at any time. Once count was completed pills were immediately returned to the patient in their original bottle.  Medication: Hydrocodone/APAP Pill/Patch Count:  86 of 180 pills remain Pill/Patch Appearance: Markings consistent with prescribed medication Bottle Appearance: Standard pharmacy container. Clearly labeled. Filled Date: 107 / 17 / 2023 Last Medication intake:  Today

## 2022-06-08 ENCOUNTER — Telehealth: Payer: Self-pay | Admitting: Internal Medicine

## 2022-06-08 ENCOUNTER — Other Ambulatory Visit: Payer: Self-pay | Admitting: Family Medicine

## 2022-06-08 DIAGNOSIS — K219 Gastro-esophageal reflux disease without esophagitis: Secondary | ICD-10-CM

## 2022-06-08 DIAGNOSIS — I10 Essential (primary) hypertension: Secondary | ICD-10-CM

## 2022-06-08 NOTE — Telephone Encounter (Signed)
Rx 02/13/22 #90 1RF- too soon Requested Prescriptions  Pending Prescriptions Disp Refills   pantoprazole (PROTONIX) 40 MG tablet [Pharmacy Med Name: PANTOPRAZOLE SOD DR 40 MG TAB] 90 tablet 1    Sig: TAKE 1 TABLET BY MOUTH EVERY DAY     Gastroenterology: Proton Pump Inhibitors Passed - 06/08/2022 12:37 PM      Passed - Valid encounter within last 12 months    Recent Outpatient Visits           1 month ago Edema of left foot   [facility] Delsa Grana, PA-C   3 months ago Essential hypertension   [place], DO   6 months ago Essential hypertension   [place], [place], DO   7 months ago Essential hypertension   [place], DO   8 months ago Facial cellulitis   [facility] Teodora Medici, DO       Future Appointments             In 2 months Teodora Medici, [facility], [facility]

## 2022-06-08 NOTE — Telephone Encounter (Signed)
Requested Prescriptions  Pending Prescriptions Disp Refills   amLODipine (NORVASC) 10 MG tablet [Pharmacy Med Name: AMLODIPINE BESYLATE 10 MG TAB] 90 tablet 0    Sig: TAKE 1 TABLET BY MOUTH EVERY DAY     Cardiovascular: Calcium Channel Blockers 2 Failed - 06/08/2022 12:37 PM      Failed - Last BP in normal range    BP Readings from Last 1 Encounters:  06/05/22 (!) 143/62         Passed - Last Heart Rate in normal range    Pulse Readings from Last 1 Encounters:  06/05/22 66         Passed - Valid encounter within last 6 months    Recent Outpatient Visits           1 month ago Edema of left foot   [facility] Delsa Grana, PA-C   3 months ago Essential hypertension   [place], DO   6 months ago Essential hypertension   [place], [place], DO   7 months ago Essential hypertension   [place], DO   8 months ago Facial cellulitis   [facility] Teodora Medici, DO       Future Appointments             In 2 months Teodora Medici, [facility], [facility]

## 2022-06-09 LAB — TOXASSURE SELECT 13 (MW), URINE

## 2022-06-25 ENCOUNTER — Telehealth: Payer: Self-pay | Admitting: Student in an Organized Health Care Education/Training Program

## 2022-06-25 ENCOUNTER — Other Ambulatory Visit: Payer: Self-pay | Admitting: *Deleted

## 2022-06-25 DIAGNOSIS — Z9889 Other specified postprocedural states: Secondary | ICD-10-CM

## 2022-06-25 DIAGNOSIS — G8921 Chronic pain due to trauma: Secondary | ICD-10-CM

## 2022-06-25 DIAGNOSIS — M1652 Unilateral post-traumatic osteoarthritis, left hip: Secondary | ICD-10-CM

## 2022-06-25 MED ORDER — HYDROCODONE-ACETAMINOPHEN 10-325 MG PO TABS: 1.0000 | ORAL_TABLET | ORAL | 0 refills | Status: DC | PRN

## 2022-06-25 NOTE — Telephone Encounter (Signed)
error 

## 2022-06-25 NOTE — Telephone Encounter (Addendum)
Patient called stating [facility] does not have her medications. They do not have the Hydrocodone 10mg , but they do have the 5mg  and she wants to know if the script can be changed and sent to her pharmacy

## 2022-06-26 NOTE — Telephone Encounter (Signed)
Called patient, states she did receive the meds from [facility].

## 2022-07-03 ENCOUNTER — Other Ambulatory Visit: Payer: Self-pay | Admitting: Internal Medicine

## 2022-07-04 NOTE — Telephone Encounter (Signed)
Requested medication (s) are due for refill today:   Yes  Requested medication (s) are on the active medication list:   Yes  Future visit scheduled:   Yes   Last ordered: 04/10/2022 #60, 2 refills  Returned because U/A is due.    Requested Prescriptions  Pending Prescriptions Disp Refills   potassium citrate (UROCIT-K) 10 MEQ (1080 MG) SR tablet [Pharmacy Med Name: POTASSIUM CITRATE ER 10 MEQ TB] 180 tablet     Sig: TAKE 1 TABLET BY MOUTH 2 TIMES DAILY.     Endocrinology:  Minerals - Potassium Citrate Failed - 07/03/2022  5:45 PM      Failed - Cr in normal range and within 120 days    Creat  Date Value Ref Range Status  05/01/2022 1.43 (H) 0.60 - 1.00 mg/dL Final         Failed - Urinalysis completed in last 4 months.    Specific Gravity, Urine  Date Value Ref Range Status  05/17/2018 1.017 1.005 - 1.030 Final   Glucose, UA  Date Value Ref Range Status  05/17/2018 NEGATIVE NEGATIVE mg/dL Final   Urobilinogen, UA  Date Value Ref Range Status  01/01/2011 0.2 0.0 - 1.0 mg/dL Final   Protein, ur  Date Value Ref Range Status  05/17/2018 30 (A) NEGATIVE mg/dL Final   Total Protein  Date Value Ref Range Status  05/01/2022 7.8 6.1 - 8.1 g/dL Final   Nitrite  Date Value Ref Range Status  05/17/2018 NEGATIVE NEGATIVE Final   RBC / HPF  Date Value Ref Range Status  05/17/2018 0-5 0 - 5 RBC/hpf Final   WBC, UA  Date Value Ref Range Status  05/17/2018 0-5 0 - 5 WBC/hpf Final         Passed - CO2 in normal range and within 120 days    CO2  Date Value Ref Range Status  05/01/2022 30 20 - 32 mmol/L Final   Co2  Date Value Ref Range Status  10/02/2012 24 21 - 32 mmol/L Final   Bicarbonate  Date Value Ref Range Status  05/21/2018 26.0 20.0 - 28.0 mmol/L Final         Passed - Cl in normal range and within 120 days    Chloride  Date Value Ref Range Status  05/01/2022 102 98 - 110 mmol/L Final  10/02/2012 105 98 - 107 mmol/L Final         Passed - K in  normal range and within 120 days    Potassium  Date Value Ref Range Status  05/01/2022 5.1 3.5 - 5.3 mmol/L Final  10/02/2012 3.5 3.5 - 5.1 mmol/L Final         Passed - Na in normal range and within 120 days    Sodium  Date Value Ref Range Status  05/01/2022 138 135 - 146 mmol/L Final  05/29/2021 144 134 - 144 mmol/L Final  10/02/2012 137 136 - 145 mmol/L Final         Passed - WBC in normal range and within 120 days    WBC  Date Value Ref Range Status  05/01/2022 6.9 3.8 - 10.8 Thousand/uL Final         Passed - HGB in normal range and within 120 days    Hemoglobin  Date Value Ref Range Status  05/01/2022 13.7 11.7 - 15.5 g/dL Final   HGB  Date Value Ref Range Status  10/02/2012 15.3 12.0 - 16.0 g/dL Final  Passed - HCT in normal range and within 120 days    HCT  Date Value Ref Range Status  05/01/2022 41.5 35.0 - 45.0 % Final  10/02/2012 44.6 35.0 - 47.0 % Final         Passed - PLT in normal range and within 120 days    Platelets  Date Value Ref Range Status  05/01/2022 249 140 - 400 Thousand/uL Final   Platelet  Date Value Ref Range Status  10/02/2012 262 150 - 440 x10 3/mm 3 Final         Passed - Valid encounter within last 4 months    Recent Outpatient Visits           2 months ago Edema of left foot   [facility] Danelle Berry, PA-C   4 months ago Essential hypertension   [facility] Margarita Mail, DO   7 months ago Essential hypertension   [facility] Caro [place], DO   8 months ago Essential hypertension   [facility] Dense M, DO   9 months ago Facial cellulitis   [facility] Margarita Mail, DO       Future Appointments             In 1 month Margarita Mail, DO [facility], [facility]

## 2022-07-24 ENCOUNTER — Telehealth: Payer: Self-pay | Admitting: Student in an Organized Health Care Education/Training Program

## 2022-07-24 NOTE — Telephone Encounter (Signed)
PT stated that she will like for her hydrocodone prescription to be send to [facility] on [address]. Please give patient a call. Thanks

## 2022-07-24 NOTE — Telephone Encounter (Signed)
I called [facility], informed them that they may now transfer prescriptions for C2 meds to another pharmacy. They told me that they can only transfer to other [facility]. I faxed the letter that we have to them and requested that Rx be sent to [facility].

## 2022-07-25 ENCOUNTER — Telehealth: Payer: Self-pay | Admitting: Student in an Organized Health Care Education/Training Program

## 2022-07-25 NOTE — Telephone Encounter (Signed)
Spoke with patient, no further issues.

## 2022-07-25 NOTE — Telephone Encounter (Signed)
Patient is calling to see if new script was sent to [facility]. I read her the answer to yesterdays call and she says [facility] can't send to a different pharmacy. I ask her to call them again today to see if it [facility].

## 2022-08-07 ENCOUNTER — Other Ambulatory Visit: Payer: Self-pay | Admitting: Internal Medicine

## 2022-08-07 DIAGNOSIS — K219 Gastro-esophageal reflux disease without esophagitis: Secondary | ICD-10-CM

## 2022-08-07 DIAGNOSIS — F419 Anxiety disorder, unspecified: Secondary | ICD-10-CM

## 2022-08-08 NOTE — Telephone Encounter (Signed)
Requested Prescriptions  Pending Prescriptions Disp Refills   busPIRone (BUSPAR) 5 MG tablet [Pharmacy Med Name: BUSPIRONE HCL 5 MG TABLET] 360 tablet 0    Sig: TAKE 1-2 TABLETS (5-10 MG TOTAL) BY MOUTH 2 (TWO) TIMES DAILY AS NEEDED (ANXIETY/NERVES/PANIC).     Psychiatry: Anxiolytics/Hypnotics - Non-controlled Passed - 08/07/2022 10:01 PM      Passed - Valid encounter within last 12 months    Recent Outpatient Visits           3 months ago Edema of left foot   [facility] Delsa Grana, PA-C   5 months ago Essential hypertension   [place], DO   8 months ago Essential hypertension   [place], [place], DO   9 months ago Essential hypertension   [place], DO   10 months ago Facial cellulitis   [facility] Teodora Medici, DO       Future Appointments             In 1 week Teodora Medici, [facility]             pantoprazole (PROTONIX) 40 MG tablet [Pharmacy Med Name: PANTOPRAZOLE SOD DR 40 MG TAB] 90 tablet 0    Sig: TAKE 1 [place]     Gastroenterology: Proton Pump Inhibitors Passed - 08/07/2022 10:01 PM      Passed - Valid encounter within last 12 months    Recent Outpatient Visits           3 months ago Edema of left foot   [facility] Delsa Grana, PA-C   5 months ago Essential hypertension   [place], DO   8 months ago Essential hypertension   [place], [place], DO   9 months ago Essential hypertension   [place], DO   10 months ago Facial cellulitis   [facility] Teodora Medici, DO       Future Appointments             In 1 week Teodora Medici, [facility], [facility]

## 2022-08-15 NOTE — Progress Notes (Unsigned)
Established Patient Office Visit  Subjective:  Patient ID: Elizabeth [name], female    DOB: [birth year]  Age: 82 y.o. MRN: [id number]  CC:  No chief complaint on file.   HPI Elizabeth [name] presents for follow up on chronic medical conditions.   Hypertension: -Medications: Lisinopril 40, Metoprolol 100 TID, Amlodipine 10, Imdur 30, Lasix 20 - will take if at home but won't take it if going out due to urinary frequency. Takes about 5 times a week  -Patient is compliant with above medications and reports no side effects. -Checking BP at home (average): 125-150/75-85 -Denies any SOB, CP, vision changes, LE edema or symptoms of hypotension. Will get facial flushing if BP high but this hasn't happened in several months  -Follows with Cardiology, note from 07/07/21 reviewed  HLD/CAD/PVD: -Medications: Crestor 40, Plavix 75, aspirin 81, Vasecpa (cannot afford will switch to fish oil) -Patient is compliant with above medications and reports no side effects. No abnormal bleeding.  -Last lipid panel: Lipid Panel     Component Value Date/Time   CHOL 138 08/16/2021 1001   TRIG 256 (H) 08/16/2021 1001   HDL 39 (L) 08/16/2021 1001   CHOLHDL 3.5 08/16/2021 1001   VLDL 28 05/20/2018 0249   LDLCALC 69 08/16/2021 1001   GERD: -Epigastric burning and increased belching recently  -Tums 3 times a day but has never been on prescription medications -Denies abdominal pain, nausea, vomiting. Appetite good, weight stable.  Anxiety: -Currently on Celexa 20, Buspar 5 once a day, sometimes will take twice a day -Moods stable, doing well on current doses -Daily compliance without side effects  Health Maintenance: -Blood work UTD -Breast cancer screening: no longer screening per patient's wishes -Colon cancer screening: no longer screening per patient's wishes   Past Medical History:  Diagnosis Date   3-vessel CAD    05/2018 LHC with a single remaining conduit which is the left main coronary  artery supplying the LAD.  Both the circumflex and right coronary artery were occluded.  The distal left main, proximal LAD is aneurysmal and heavily calcified.  The LAD supplies well-formed collaterals to the PDA.  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[place])    05/2018   Mitral regurgitation    Mild by 05/2018 echo   Paroxysmal SVT (supraventricular tachycardia)    05/2018   Peripheral vascular disease ([place])    extensive vascular dz s/p b/l carotid endarterectomies (2014) and iliac stenting, AAA, aortic ulcer   Right renal artery stenosis ([place])    05/2018   Smoking hx    Quit ~1992   Status post bilateral carotid endarterectomy    Followed by Vascular surgery with most recent images 05/2018 showing L ICA 80-99% stenosis   Subclavian artery stenosis, left ([place])    05/2018    Past Surgical History:  Procedure Laterality Date   APPENDECTOMY     CARDIAC CATHETERIZATION  HIP SURGERY Left    x3   LEFT HEART CATH AND CORONARY ANGIOGRAPHY N/A 05/19/2018   Procedure: LEFT HEART CATH AND CORONARY ANGIOGRAPHY;  Surgeon: Belva Crome, MD;  Location: [place] [facility];  Service: Cardiovascular;  Laterality: N/A;   PERIPHERAL VASCULAR CATHETERIZATION Right 02/02/2016   Procedure: Lower Extremity Angiography;  Surgeon: Algernon Huxley, MD;  Location: [place] [facility];  Service: Cardiovascular;  Laterality: Right;    Family History  Problem Relation Age  of Onset   Hypertension Mother    Stroke Mother    Hypertension Father    Stroke Father    Hypertension Sister    Hyperlipidemia Sister    Hodgkin's lymphoma Son    Heart disease Son    Kidney disease Son    Mental illness Neg Hx     Social History   Socioeconomic History   Marital status: Widowed    Spouse name: Not on file   Number of children: 2   Years of education: Not on file   Highest education level: GED or equivalent  Occupational History   Occupation: diabled  Tobacco Use   Smoking status: Former    Types: Cigarettes    Quit date: 02/02/1999    Years since quitting: 23.5   Smokeless tobacco: Never  Vaping Use   Vaping Use: Never used  Substance and Sexual Activity   Alcohol use: No   Drug use: No   Sexual activity: Not Currently  Other Topics Concern   Not on file  Social History Narrative   Pt lives alone, does not drive but sister drives her when needed.    Social Determinants of Health   Financial Resource Strain: Low Risk  (05/01/2022)   Overall Financial Resource Strain (CARDIA)    Difficulty of Paying Living Expenses: Not hard at all  Food Insecurity: No Food Insecurity (05/01/2022)   Hunger Vital Sign    Worried About Running Out of Food in the Last Year: Never true    Ran Out of Food in the Last Year: Never true  Transportation Needs: No Transportation Needs (05/01/2022)   PRAPARE - Hydrologist (Medical): No    Lack of Transportation (Non-Medical): No  Physical Activity: Inactive (05/01/2022)   Exercise Vital Sign    Days of Exercise per Week: 0 days    Minutes of Exercise per Session: 0 min  Stress: No Stress Concern Present (05/01/2022)   [place]    Feeling of Stress : Not at all  Social Connections: Moderately Integrated (05/01/2022)   Social Connection and Isolation Panel [NHANES]    Frequency of Communication with Friends and Family: More than  three times a week    Frequency of Social Gatherings with Friends and Family: More than three times a week    Attends Religious Services: More than 4 times per year    Active Member of [employer] or Organizations: Yes    Attends Archivist Meetings: More than 4 times per year    Marital Status: Widowed  Intimate Partner Violence: Not At Risk (05/01/2022)   Humiliation, Afraid, Rape, and Kick questionnaire    Fear of Current or Ex-Partner: No    Emotionally Abused: No    Physically Abused: No    Sexually Abused: No    Outpatient Medications Prior to Visit  Medication Sig Dispense Refill   amLODipine (NORVASC) 10 MG tablet TAKE 1 TABLET BY MOUTH  EVERY DAY 90 tablet 0   aspirin EC 81 MG tablet Take 1 tablet (81 mg total) by mouth daily. Swallow whole. 90 tablet 3   busPIRone (BUSPAR) 5 MG tablet TAKE 1-2 TABLETS (5-10 MG TOTAL) BY MOUTH 2 (TWO) TIMES DAILY AS NEEDED (ANXIETY/NERVES/PANIC). 360 tablet 0   cholecalciferol (VITAMIN D) 1000 units tablet Take 1,000 Units by mouth daily.     citalopram (CELEXA) 20 MG tablet TAKE 1 TABLET BY MOUTH EVERYDAY AT BEDTIME 90 tablet 1   clopidogrel (PLAVIX) 75 MG tablet TAKE 1 TABLET BY MOUTH EVERY DAY 90 tablet 1   Cyanocobalamin (VITAMIN B-12 PO) Take 1 tablet by mouth daily.     furosemide (LASIX) 20 MG tablet Take 1 tablet (20 mg total) by mouth daily. 90 tablet 2   glucosamine-chondroitin 500-400 MG tablet Take 1 tablet by mouth daily.     HYDROcodone-acetaminophen (NORCO) 10-325 MG tablet Take 1 tablet by mouth every 4 (four) hours as needed for severe pain. Must last 30 days. 180 tablet 0   HYDROcodone-acetaminophen (NORCO) 10-325 MG tablet Take 1 tablet by mouth every 4 (four) hours as needed for severe pain. Must last 30 days. 180 tablet 0   HYDROcodone-acetaminophen (NORCO) 10-325 MG tablet Take 1 tablet by mouth every 4 (four) hours as needed for severe pain. Must last 30 days. 180 tablet 0   [START ON 08/20/2022] HYDROcodone-acetaminophen  (NORCO) 10-325 MG tablet Take 1 tablet by mouth every 4 (four) hours as needed for severe pain. Must last 30 days. 180 tablet 0   HYDROcodone-acetaminophen (NORCO) 10-325 MG tablet Take 1 tablet by mouth every 4 (four) hours as needed for severe pain. Must last 30 days. 180 tablet 0   isosorbide mononitrate (IMDUR) 30 MG 24 hr tablet TAKE 1/2 OF A TABLET (15 MG TOTAL) BY MOUTH DAILY 45 tablet 3   lisinopril (ZESTRIL) 40 MG tablet TAKE 1 TABLET BY MOUTH EVERY DAY 90 tablet 3   metoprolol tartrate (LOPRESSOR) 100 MG tablet Take 1 tablet (100 mg total) by mouth 3 (three) times daily. 270 tablet 2   METRONIDAZOLE, TOPICAL, 0.75 % LOTN APPLY 1 APPLICATION. TOPICALLY 2 (TWO) TIMES DAILY. AFTER WASHING. 59 mL 0   Multiple Vitamin (MULTIVITAMIN) tablet Take 1 tablet by mouth daily.     nitroGLYCERIN (NITROSTAT) 0.4 MG SL tablet Place 1 tablet (0.4 mg total) under the tongue every 5 (five) minutes as needed for chest pain. 25 tablet 3   pantoprazole (PROTONIX) 40 MG tablet TAKE 1 TABLET BY MOUTH EVERY DAY 90 tablet 0   potassium citrate (UROCIT-K) 10 MEQ (1080 MG) SR tablet TAKE 1 TABLET BY MOUTH 2 TIMES DAILY. 180 tablet 0   rosuvastatin (CRESTOR) 40 MG tablet Take 1 tablet (40 mg total) by mouth daily. 90 tablet 3   No facility-administered medications prior to visit.    Allergies  Allergen Reactions   Carvedilol Other (See Comments)    intolerance   Codeine Other (See Comments)    Reaction: Unsure   Hydralazine Other (See Comments)    intolerance   Sulfa Antibiotics Other (See Comments)    Mouth blisters    ROS Review of Systems  Constitutional:  Negative for activity change, appetite change, chills and fever.  Eyes:  Negative for visual disturbance.  Respiratory:  Negative for cough and shortness of breath.   Cardiovascular:  Negative for chest pain.  Gastrointestinal:  Negative for abdominal pain, blood in stool, nausea and vomiting.  Genitourinary:  Negative for hematuria.  Neurological:  Negative for dizziness and headaches.  Hematological:  Does not bruise/bleed easily.      Objective:    Physical Exam Constitutional:      Appearance: Normal appearance.  HENT:     Head: Normocephalic and atraumatic.     Mouth/Throat:     Mouth: Mucous membranes are moist.     Pharynx: Oropharynx is clear.  Eyes:     Conjunctiva/sclera: Conjunctivae normal.  Cardiovascular:     Rate and Rhythm: Normal rate and regular rhythm.  Pulmonary:     Effort: Pulmonary effort is normal.     Breath sounds: Normal breath sounds.  Musculoskeletal:     Right lower leg: No edema.     Left lower leg: No edema.  Skin:    General: Skin is warm and dry.  Neurological:     General: No focal deficit present.     Mental Status: She is alert. Mental status is at baseline.  Psychiatric:        Mood and Affect: Mood normal.        Behavior: Behavior normal.     There were no vitals taken for this visit. Wt Readings from Last 3 Encounters:  06/05/22 145 lb (65.8 kg)  05/01/22 148 lb (67.1 kg)  05/01/22 148 lb (67.1 kg)     Health Maintenance Due  Topic Date Due   DTaP/Tdap/Td (1 - Tdap) Never done   Zoster Vaccines- Shingrix (1 of 2) Never done   COVID-19 Vaccine (7 - 2023-24 season) 04/06/2022    There are no preventive care reminders to display for this patient.  Lab Results  Component Value Date   TSH 1.43 08/04/2020   Lab Results  Component Value Date   WBC 6.9 05/01/2022   HGB 13.7 05/01/2022   HCT 41.5 05/01/2022   MCV 101.7 (H) 05/01/2022   PLT 249 05/01/2022   Lab Results  Component Value Date   NA 138 05/01/2022   K 5.1 05/01/2022   CO2 30 05/01/2022   GLUCOSE 80 05/01/2022   BUN 29 (H) 05/01/2022   CREATININE 1.43 (H) 05/01/2022   BILITOT 0.4 05/01/2022   ALKPHOS 62 05/18/2018   AST 21 05/01/2022   ALT 12 05/01/2022   PROT 7.8 05/01/2022   ALBUMIN 2.8 (L) 05/21/2018   CALCIUM 9.6 05/01/2022   ANIONGAP 10 03/13/2020   EGFR 37 (L)  05/01/2022   Lab Results  Component Value Date   CHOL 138 08/16/2021   Lab Results  Component Value Date   HDL 39 (L) 08/16/2021   Lab Results  Component Value Date   LDLCALC 69 08/16/2021   Lab Results  Component Value Date   TRIG 256 (H) 08/16/2021   Lab Results  Component Value Date   CHOLHDL 3.5 08/16/2021   Lab Results  Component Value Date   HGBA1C 5 12/16/2020      Assessment & Plan:   Problem List Items Addressed This Visit   None   Follow-up: No follow-ups on file.    Margarita Mail, DO

## 2022-08-16 ENCOUNTER — Ambulatory Visit (INDEPENDENT_AMBULATORY_CARE_PROVIDER_SITE_OTHER): Payer: Medicare Other | Admitting: Internal Medicine

## 2022-08-16 VITALS — BP 132/60 | HR 62 | Temp 98.2°F | Resp 18 | Ht 61.0 in | Wt 149.8 lb

## 2022-08-16 DIAGNOSIS — I25118 Atherosclerotic heart disease of native coronary artery with other forms of angina pectoris: Secondary | ICD-10-CM | POA: Diagnosis not present

## 2022-08-16 DIAGNOSIS — I739 Peripheral vascular disease, unspecified: Secondary | ICD-10-CM | POA: Diagnosis not present

## 2022-08-16 DIAGNOSIS — K219 Gastro-esophageal reflux disease without esophagitis: Secondary | ICD-10-CM | POA: Diagnosis not present

## 2022-08-16 DIAGNOSIS — I10 Essential (primary) hypertension: Secondary | ICD-10-CM | POA: Diagnosis not present

## 2022-08-16 DIAGNOSIS — F419 Anxiety disorder, unspecified: Secondary | ICD-10-CM

## 2022-08-16 MED ORDER — CITALOPRAM HYDROBROMIDE 20 MG PO TABS: ORAL_TABLET | ORAL | 1 refills | Status: DC

## 2022-08-16 MED ORDER — ISOSORBIDE MONONITRATE ER 30 MG PO TB24: ORAL_TABLET | ORAL | 3 refills | Status: DC

## 2022-08-16 MED ORDER — LISINOPRIL 40 MG PO TABS: 40.0000 mg | ORAL_TABLET | Freq: Every day | ORAL | 3 refills | Status: DC

## 2022-08-16 MED ORDER — AMLODIPINE BESYLATE 10 MG PO TABS: 10.0000 mg | ORAL_TABLET | Freq: Every day | ORAL | 1 refills | Status: DC

## 2022-08-16 MED ORDER — CLOPIDOGREL BISULFATE 75 MG PO TABS: 75.0000 mg | ORAL_TABLET | Freq: Every day | ORAL | 1 refills | Status: DC

## 2022-08-16 NOTE — Patient Instructions (Signed)
It was great seeing you today!  Plan discussed at today's visit: -Medications refilled -Plan for fasting labs at follow up   Follow up in: 6 months   Take care and let us know if you have any questions or concerns prior to your next visit.  Dr. Rosana Berger

## 2022-08-28 ENCOUNTER — Encounter (INDEPENDENT_AMBULATORY_CARE_PROVIDER_SITE_OTHER): Payer: Medicare Other

## 2022-08-28 ENCOUNTER — Ambulatory Visit (INDEPENDENT_AMBULATORY_CARE_PROVIDER_SITE_OTHER): Payer: Medicare Other | Admitting: Vascular Surgery

## 2022-08-28 ENCOUNTER — Other Ambulatory Visit (INDEPENDENT_AMBULATORY_CARE_PROVIDER_SITE_OTHER): Payer: Medicare Other

## 2022-08-31 ENCOUNTER — Ambulatory Visit (INDEPENDENT_AMBULATORY_CARE_PROVIDER_SITE_OTHER): Payer: Medicare Other | Admitting: Nurse Practitioner

## 2022-08-31 ENCOUNTER — Encounter (INDEPENDENT_AMBULATORY_CARE_PROVIDER_SITE_OTHER): Payer: Medicare Other

## 2022-09-04 ENCOUNTER — Ambulatory Visit
Payer: Medicare Other | Attending: Student in an Organized Health Care Education/Training Program | Admitting: Student in an Organized Health Care Education/Training Program

## 2022-09-04 ENCOUNTER — Encounter: Payer: Self-pay | Admitting: Student in an Organized Health Care Education/Training Program

## 2022-09-04 VITALS — BP 147/67 | HR 57 | Temp 97.8°F | Ht 61.0 in | Wt 147.0 lb

## 2022-09-04 DIAGNOSIS — Z9889 Other specified postprocedural states: Secondary | ICD-10-CM

## 2022-09-04 DIAGNOSIS — G8921 Chronic pain due to trauma: Secondary | ICD-10-CM

## 2022-09-04 DIAGNOSIS — M1652 Unilateral post-traumatic osteoarthritis, left hip: Secondary | ICD-10-CM

## 2022-09-04 MED ORDER — HYDROCODONE-ACETAMINOPHEN 10-325 MG PO TABS: 1.0000 | ORAL_TABLET | ORAL | 0 refills | Status: DC | PRN

## 2022-09-04 NOTE — Progress Notes (Signed)
Nursing Pain Medication Assessment:  Safety precautions to be maintained throughout the outpatient stay will include: orient to surroundings, keep bed in low position, maintain call bell within reach at all times, provide assistance with transfer out of bed and ambulation.  Medication Inspection Compliance: Pill count conducted under aseptic conditions, in front of the patient. Neither the pills nor the bottle was removed from the patient's sight at any time. Once count was completed pills were immediately returned to the patient in their original bottle.  Medication: Hydrocodone/APAP Pill/Patch Count:  117 of 180 pills remain Pill/Patch Appearance: Markings consistent with prescribed medication Bottle Appearance: Standard pharmacy container. Clearly labeled. Filled Date: 01 / 20 / 2024 Last Medication intake:  TodaySafety precautions to be maintained throughout the outpatient stay will include: orient to surroundings, keep bed in low position, maintain call bell within reach at all times, provide assistance with transfer out of bed and ambulation.
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
## 2022-09-05 ENCOUNTER — Other Ambulatory Visit: Payer: Self-pay | Admitting: Internal Medicine

## 2022-09-05 DIAGNOSIS — K219 Gastro-esophageal reflux disease without esophagitis: Secondary | ICD-10-CM

## 2022-09-06 NOTE — Telephone Encounter (Signed)
Last RF 08/08/22 #90  Requested Prescriptions  Refused Prescriptions Disp Refills   pantoprazole (PROTONIX) 40 MG tablet [Pharmacy Med Name: PANTOPRAZOLE SOD DR 40 MG TAB] 90 tablet 0    Sig: TAKE 1 TABLET BY MOUTH EVERY DAY     Gastroenterology: Proton Pump Inhibitors Passed - 09/05/2022  6:31 PM      Passed - Valid encounter within last 12 months    Recent Outpatient Visits           3 weeks ago Essential (primary) hypertension   [place], DO   4 months ago Edema of left foot   [facility] Delsa Grana, PA-C   6 months ago Essential hypertension   [place], DO   9 months ago Essential hypertension   [place], DO   10 months ago Essential hypertension   [place], DO       Future Appointments             In 1 week Agbor-Etang, Aaron Edelman, MD [facility] [place]   In 5 months Teodora Medici, [facility]

## 2022-09-13 LAB — HEMOGLOBIN A1C: Hemoglobin A1C: 5.3

## 2022-09-13 LAB — HM DIABETES FOOT EXAM: HM Diabetic Foot Exam: NORMAL

## 2022-09-14 ENCOUNTER — Encounter: Payer: Self-pay | Admitting: Cardiology

## 2022-09-14 ENCOUNTER — Ambulatory Visit: Payer: Medicare Other | Attending: Cardiology | Admitting: Cardiology

## 2022-09-14 VITALS — BP 148/80 | HR 57 | Ht 61.0 in | Wt 151.0 lb

## 2022-09-14 DIAGNOSIS — I5189 Other ill-defined heart diseases: Secondary | ICD-10-CM

## 2022-09-14 DIAGNOSIS — I2510 Atherosclerotic heart disease of native coronary artery without angina pectoris: Secondary | ICD-10-CM

## 2022-09-14 DIAGNOSIS — I10 Essential (primary) hypertension: Secondary | ICD-10-CM | POA: Diagnosis not present

## 2022-09-14 MED ORDER — NITROGLYCERIN 0.4 MG SL SUBL: 0.4000 mg | SUBLINGUAL_TABLET | SUBLINGUAL | 0 refills | Status: DC | PRN

## 2022-09-14 NOTE — Patient Instructions (Signed)
Medication Instructions:   Nitroglycerin 0.4 sublingual- Take only as needed.  Your physician recommends that you continue on your current medications as directed. Please refer to the Current Medication list given to you today.  *If you need a refill on your cardiac medications before your next appointment, please call your pharmacy*   Lab Work:  NONE  If you have labs (blood work) drawn today and your tests are completely normal, you will receive your results only by: [place] (if you have MyChart) OR A paper copy in the mail If you have any lab test that is abnormal or we need to change your treatment, we will call you to review the results.   Testing/Procedures:  NONE   Follow-Up: At [facility], you and your health needs are our priority.  As part of our continuing mission to provide you with exceptional heart care, we have created designated Provider Care Teams.  These Care Teams include your primary Cardiologist (physician) and Advanced Practice Providers (APPs -  Physician Assistants and Nurse Practitioners) who all work together to provide you with the care you need, when you need it.  We recommend signing up for the patient portal called "MyChart".  Sign up information is provided on this After Visit Summary.  MyChart is used to connect with patients for Virtual Visits (Telemedicine).  Patients are able to view lab/test results, encounter notes, upcoming appointments, etc.  Non-urgent messages can be sent to your provider as well.   To learn more about what you can do with MyChart, go to [email or web address].    Your next appointment:   12 month(s)  Provider:   You may see Kate Sable, MD or one of the following Advanced Practice Providers on your designated Care Team:   Murray Hodgkins, NP Christell Faith, PA-C Cadence Kathlen Mody, PA-C Gerrie Nordmann, NP

## 2022-09-14 NOTE — Progress Notes (Signed)
Cardiology Office Note:    Date:  09/14/2022   ID:  Elizabeth [name], DOB 11/13/1940, MRN [id number]  PCP:  Elizabeth Medici, DO  Cardiologist:  Elizabeth Sable, MD  Electrophysiologist:  None   Referring MD: Elizabeth Medici, DO   Chief Complaint  Patient presents with   Follow-up    Annual f/u, no new cardiac concerns, is Nitroglycerin Rx needed?    History of Present Illness:    Elizabeth [name] is a 82 y.o. female with a hx of multivessel CAD ([place] 2019 CTO mid RCA, CTO LCx, 60% dLM, 80% prox LAD),  hypertension, hyperlipidemia, carotid artery stenosis status post carotid endarterectomy in 2014, PVD with left common iliac stenting,, AAA 4.3 cm who presents for follow-up.    Denies chest pain or shortness of breath.  Has not required sublingual nitroglycerin.  Compliant with medications as prescribed.  Feels well, has no concerns at this time.   Prior notes Echocardiogram 04/2021 EF 60 to [id number], grade 2 diastolic dysfunction.  In October 2019, she underwent a left heart cath showing multivessel disease but was not seen as a candidate for revascularization.  Medical therapy was recommended.  The plan is if she develops symptoms in the future, high risk PCI may be considered.  echocardiogram on 05/2018 showed normal ejection fraction with EF 60 to 65%  She is being followed by vascular surgery for management of AAA, carotid and peripheral vascular disease.   Patient history of intolerance to several BP meds: hydralazine due to dizziness, also intolerant to Coreg.  She takes Lopressor 100 mg in the morning and 50/100 mg in the evening depending on blood pressure.  She has done this for years and is comfortable with this approach   Past Medical History:  Diagnosis Date   3-vessel CAD    05/2018 LHC with a single remaining conduit which is the left main coronary artery supplying the LAD.  Both the circumflex and right coronary artery were occluded.  The distal left main,  proximal LAD is aneurysmal and heavily calcified.  The LAD supplies well-formed collaterals to the PDA.  Not felt to be candidate for revascularization and with recommendation for medical mgmt   AAA (abdominal aortic aneurysm) ([place])    4.3cm (12/2018)   Celiac artery stenosis (HCC)    Degenerative joint disease of left hip    Heart failure with preserved ejection fraction ([place])    EF 60-65%, 05/2018   History of non-ST elevation myocardial infarction (NSTEMI)    05/2018 with subsequent LHC and in the setting of acute aortic ulcer and hematoma   Hyperlipidemia    Hypertension    Internal carotid artery stenosis, left    80-99% 05/2018   Intramural hematoma of thoracic aorta ([place])    05/2018   Mitral regurgitation    Mild by 05/2018 echo   Paroxysmal SVT (supraventricular tachycardia)    05/2018   Peripheral vascular disease ([place])    extensive vascular dz s/p b/l carotid endarterectomies (2014) and iliac stenting, AAA, aortic ulcer   Right renal artery stenosis ([place])    05/2018   Smoking hx    Quit ~1992   Status post bilateral carotid endarterectomy    Followed by Vascular surgery with most recent images 05/2018 showing L ICA 80-99% stenosis   Subclavian artery stenosis, left ([place])    05/2018    Past Surgical History:  Procedure Laterality Date   APPENDECTOMY     CARDIAC CATHETERIZATION     HIP SURGERY  Left    x3   LEFT HEART CATH AND CORONARY ANGIOGRAPHY N/A 05/19/2018   Procedure: LEFT HEART CATH AND CORONARY ANGIOGRAPHY;  Surgeon: Elizabeth Crome, MD;  Location: [facility];  Service: Cardiovascular;  Laterality: N/A;   PERIPHERAL VASCULAR CATHETERIZATION Right 02/02/2016   Procedure: Lower Extremity Angiography;  Surgeon: Elizabeth Huxley, MD;  Location: [facility];  Service: Cardiovascular;  Laterality: Right;    Current Medications: Current Meds  Medication Sig   amLODipine (NORVASC) 10 MG tablet Take 1 tablet (10 mg total) by mouth daily.   aspirin EC 81  MG tablet Take 1 tablet (81 mg total) by mouth daily. Swallow whole.   busPIRone (BUSPAR) 5 MG tablet TAKE 1-2 TABLETS (5-10 MG TOTAL) BY MOUTH 2 (TWO) TIMES DAILY AS NEEDED (ANXIETY/NERVES/PANIC).   cholecalciferol (VITAMIN D) 1000 units tablet Take 1,000 Units by mouth daily.   citalopram (CELEXA) 20 MG tablet TAKE 1 TABLET BY MOUTH EVERYDAY AT BEDTIME   clopidogrel (PLAVIX) 75 MG tablet Take 1 tablet (75 mg total) by mouth daily.   Cyanocobalamin (VITAMIN B-12 PO) Take 1 tablet by mouth daily.   furosemide (LASIX) 20 MG tablet Take 1 tablet (20 mg total) by mouth daily.   glucosamine-chondroitin 500-400 MG tablet Take 1 tablet by mouth daily.   [START ON 11/23/2022] HYDROcodone-acetaminophen (NORCO) 10-325 MG tablet Take 1 tablet by mouth every 4 (four) hours as needed for severe pain. Must last 30 days.   isosorbide mononitrate (IMDUR) 30 MG 24 hr tablet TAKE 1/2 OF A TABLET (15 MG TOTAL) BY MOUTH DAILY   lisinopril (ZESTRIL) 40 MG tablet Take 1 tablet (40 mg total) by mouth daily.   METRONIDAZOLE, TOPICAL, 0.75 % LOTN APPLY 1 APPLICATION. TOPICALLY 2 (TWO) TIMES DAILY. AFTER WASHING.   Multiple Vitamin (MULTIVITAMIN) tablet Take 1 tablet by mouth daily.   nitroGLYCERIN (NITROSTAT) 0.4 MG SL tablet Place 1 tablet (0.4 mg total) under the tongue every 5 (five) minutes as needed for chest pain.   pantoprazole (PROTONIX) 40 MG tablet TAKE 1 TABLET BY MOUTH EVERY DAY   potassium citrate (UROCIT-K) 10 MEQ (1080 MG) SR tablet TAKE 1 TABLET BY MOUTH 2 TIMES DAILY.   rosuvastatin (CRESTOR) 40 MG tablet Take 1 tablet (40 mg total) by mouth daily.     Allergies:   Carvedilol, Codeine, Hydralazine, and Sulfa antibiotics   Social History   Socioeconomic History   Marital status: Widowed    Spouse name: Not on file   Number of children: 2   Years of education: Not on file   Highest education level: GED or equivalent  Occupational History   Occupation: diabled  Tobacco Use   Smoking status:  Former    Types: Cigarettes    Quit date: 02/02/1999    Years since quitting: 23.6   Smokeless tobacco: Never  Vaping Use   Vaping Use: Never used  Substance and Sexual Activity   Alcohol use: No   Drug use: No   Sexual activity: Not Currently  Other Topics Concern   Not on file  Social History Narrative   Pt lives alone, does not drive but sister drives her when needed.    Social Determinants of Health   Financial Resource Strain: Low Risk  (05/01/2022)   Overall Financial Resource Strain (CARDIA)    Difficulty of Paying Living Expenses: Not hard at all  Food Insecurity: No Food Insecurity (05/01/2022)   Hunger Vital Sign    Worried About Running Out  of Food in the Last Year: Never true    [place] in the Last Year: Never true  Transportation Needs: No Transportation Needs (05/01/2022)   PRAPARE - Hydrologist (Medical): No    Lack of Transportation (Non-Medical): No  Physical Activity: Inactive (05/01/2022)   Exercise Vital Sign    Days of Exercise per Week: 0 days    Minutes of Exercise per Session: 0 min  Stress: No Stress Concern Present (05/01/2022)   [place]    Feeling of Stress : Not at all  Social Connections: Moderately Integrated (05/01/2022)   Social Connection and Isolation Panel [NHANES]    Frequency of Communication with Friends and Family: More than three times a week    Frequency of Social Gatherings with Friends and Family: More than three times a week    Attends Religious Services: More than 4 times per year    Active Member of [employer] or Organizations: Yes    Attends Archivist Meetings: More than 4 times per year    Marital Status: Widowed     Family History: The patient's family history includes Heart disease in her son; Hodgkin's lymphoma in her son; Hyperlipidemia in her sister; Hypertension in her father, mother, and sister; Kidney disease in  her son; Stroke in her father and mother. There is no history of Mental illness.  ROS:   Please see the history of present illness.     All other systems reviewed and are negative.  EKGs/Labs/Other Studies Reviewed:    The following studies were reviewed today:   EKG:  EKG is ordered today.  EKG shows sinus bradycardia, heart rate 57  Recent Labs: 05/01/2022: ALT 12; BUN 29; Creat 1.43; Hemoglobin 13.7; Platelets 249; Potassium 5.1; Sodium 138  Recent Lipid Panel    Component Value Date/Time   CHOL 138 08/16/2021 1001   TRIG 256 (H) 08/16/2021 1001   HDL 39 (L) 08/16/2021 1001   CHOLHDL 3.5 08/16/2021 1001   VLDL 28 05/20/2018 0249   LDLCALC 69 08/16/2021 1001    Physical Exam:    VS:  BP (!) 148/80 (BP Location: Right Arm)   Pulse (!) 57   Ht 5' 1"$  (1.549 m)   Wt 151 lb (68.5 kg)   SpO2 93%   BMI 28.53 kg/m     Wt Readings from Last 3 Encounters:  09/14/22 151 lb (68.5 kg)  09/04/22 147 lb (66.7 kg)  08/16/22 149 lb 12.8 oz (67.9 kg)     GEN:  Well nourished, well developed in no acute distress HEENT: Normal NECK: No JVD; bilateral carotid bruits noted CARDIAC: RRR, 2/6 systolic murmur at right upper sternal border RESPIRATORY:  Clear to auscultation without rales, wheezing or rhonchi  ABDOMEN: Soft, non-tender, non-distended MUSCULOSKELETAL:  No edema; No deformity  SKIN: Warm and dry NEUROLOGIC:  Alert and oriented x 3 PSYCHIATRIC:  Normal affect   ASSESSMENT:   . 1. Diastolic dysfunction   2. Primary hypertension   3. Coronary artery disease involving native coronary artery of native heart without angina pectoris     PLAN:    In order of problems listed above:  Diastolic dysfunction grade 2. Echo 04/2021 EF 60 to 65%.  Euvolemic.  Continue Lasix 20 mg daily. hypertension.  BP elevated today, usually controlled.  Continue current meds as prescribed.  Lopressor, Imdur, Norvasc, lisinopril.   Multivessel CAD, not amenable to PCI, (CTO RCA,  CTO Lcx, 60%  ostial LM, 80% prox LAD, 70%mid ) medical management recommended.  Denies chest pain.  Continue aspirin, Plavix, Crestor, imdur.  Okay for sublingual nitro  Follow-up in 12 months.    This note was generated in part or whole with voice recognition software. Voice recognition is usually quite accurate but there are transcription errors that can and very often do occur. I apologize for any typographical errors that were not detected and corrected.  Medication Adjustments/Labs and Tests Ordered: Current medicines are reviewed at length with the patient today.  Concerns regarding medicines are outlined above.  Orders Placed This Encounter  Procedures   EKG 12-Lead      Meds ordered this encounter  Medications   nitroGLYCERIN (NITROSTAT) 0.4 MG SL tablet    Sig: Place 1 tablet (0.4 mg total) under the tongue every 5 (five) minutes as needed for chest pain.    Dispense:  15 tablet    Refill:  0       Patient Instructions  Medication Instructions:   Nitroglycerin 0.4 sublingual- Take only as needed.  Your physician recommends that you continue on your current medications as directed. Please refer to the Current Medication list given to you today.  *If you need a refill on your cardiac medications before your next appointment, please call your pharmacy*   Lab Work:  NONE  If you have labs (blood work) drawn today and your tests are completely normal, you will receive your results only by: [place] (if you have MyChart) OR A paper copy in the mail If you have any lab test that is abnormal or we need to change your treatment, we will call you to review the results.   Testing/Procedures:  NONE   Follow-Up: At [facility], you and your health needs are our priority.  As part of our continuing mission to provide you with exceptional heart care, we have created designated Provider Care Teams.  These Care Teams include your primary Cardiologist (physician) and  Advanced Practice Providers (APPs -  Physician Assistants and Nurse Practitioners) who all work together to provide you with the care you need, when you need it.  We recommend signing up for the patient portal called "MyChart".  Sign up information is provided on this After Visit Summary.  MyChart is used to connect with patients for Virtual Visits (Telemedicine).  Patients are able to view lab/test results, encounter notes, upcoming appointments, etc.  Non-urgent messages can be sent to your provider as well.   To learn more about what you can do with MyChart, go to [email or web address].    Your next appointment:   12 month(s)  Provider:   You may see Elizabeth Sable, MD or one of the following Advanced Practice Providers on your designated Care Team:   Murray Hodgkins, NP Christell Faith, PA-C Cadence Kathlen Mody, PA-C Gerrie Nordmann, NP   Signed, Elizabeth Sable, MD  09/14/2022 11:33 AM    [place]

## 2022-10-06 ENCOUNTER — Other Ambulatory Visit: Payer: Self-pay | Admitting: Internal Medicine

## 2022-10-08 NOTE — Telephone Encounter (Signed)
Requested Prescriptions  Pending Prescriptions Disp Refills   potassium citrate (UROCIT-K) 10 MEQ (1080 MG) SR tablet [Pharmacy Med Name: POTASSIUM CITRATE ER 10 MEQ TB] 60 tablet 1    Sig: TAKE 1 TABLET BY MOUTH TWICE A DAY     Endocrinology:  Minerals - Potassium Citrate Failed - 10/06/2022  1:11 AM      Failed - CO2 in normal range and within 120 days    CO2  Date Value Ref Range Status  05/01/2022 30 20 - 32 mmol/L Final   Co2  Date Value Ref Range Status  10/02/2012 24 21 - 32 mmol/L Final   Bicarbonate  Date Value Ref Range Status  05/21/2018 26.0 20.0 - 28.0 mmol/L Final         Failed - Cl in normal range and within 120 days    Chloride  Date Value Ref Range Status  05/01/2022 102 98 - 110 mmol/L Final  10/02/2012 105 98 - 107 mmol/L Final         Failed - Cr in normal range and within 120 days    Creat  Date Value Ref Range Status  05/01/2022 1.43 (H) 0.60 - 1.00 mg/dL Final         Failed - K in normal range and within 120 days    Potassium  Date Value Ref Range Status  05/01/2022 5.1 3.5 - 5.3 mmol/L Final  10/02/2012 3.5 3.5 - 5.1 mmol/L Final         Failed - Na in normal range and within 120 days    Sodium  Date Value Ref Range Status  05/01/2022 138 135 - 146 mmol/L Final  05/29/2021 144 134 - 144 mmol/L Final  10/02/2012 137 136 - 145 mmol/L Final         Failed - WBC in normal range and within 120 days    WBC  Date Value Ref Range Status  05/01/2022 6.9 3.8 - 10.8 Thousand/uL Final         Failed - HGB in normal range and within 120 days    Hemoglobin  Date Value Ref Range Status  05/01/2022 13.7 11.7 - 15.5 g/dL Final   HGB  Date Value Ref Range Status  10/02/2012 15.3 12.0 - 16.0 g/dL Final         Failed - HCT in normal range and within 120 days    HCT  Date Value Ref Range Status  05/01/2022 41.5 35.0 - 45.0 % Final  10/02/2012 44.6 35.0 - 47.0 % Final         Failed - PLT in normal range and within 120 days    Platelets   Date Value Ref Range Status  05/01/2022 249 140 - 400 Thousand/uL Final   Platelet  Date Value Ref Range Status  10/02/2012 262 150 - 440 x10 3/mm 3 Final         Failed - Urinalysis completed in last 4 months.    Specific Gravity, Urine  Date Value Ref Range Status  05/17/2018 1.017 1.005 - 1.030 Final   Glucose, UA  Date Value Ref Range Status  05/17/2018 NEGATIVE NEGATIVE mg/dL Final   Urobilinogen, UA  Date Value Ref Range Status  01/01/2011 0.2 0.0 - 1.0 mg/dL Final   Protein, ur  Date Value Ref Range Status  05/17/2018 30 (A) NEGATIVE mg/dL Final   Total Protein  Date Value Ref Range Status  05/01/2022 7.8 6.1 - 8.1 g/dL Final   Nitrite  Date  Value Ref Range Status  05/17/2018 NEGATIVE NEGATIVE Final   RBC / HPF  Date Value Ref Range Status  05/17/2018 0-5 0 - 5 RBC/hpf Final   WBC, UA  Date Value Ref Range Status  05/17/2018 0-5 0 - 5 WBC/hpf Final         Passed - Valid encounter within last 4 months    Recent Outpatient Visits           1 month ago Essential (primary) hypertension   [place], DO   5 months ago Edema of left foot   [facility] Delsa Grana, PA-C   7 months ago Essential hypertension   [place], DO   10 months ago Essential hypertension   [place], DO   11 months ago Essential hypertension   [facility] Myles Gip, DO       Future Appointments             In 4 months Teodora Medici, [facility], [facility]

## 2022-10-26 ENCOUNTER — Other Ambulatory Visit: Payer: Self-pay | Admitting: Internal Medicine

## 2022-10-26 DIAGNOSIS — I10 Essential (primary) hypertension: Secondary | ICD-10-CM

## 2022-10-26 DIAGNOSIS — I25118 Atherosclerotic heart disease of native coronary artery with other forms of angina pectoris: Secondary | ICD-10-CM

## 2022-10-26 NOTE — Telephone Encounter (Unsigned)
Copied from [place] [id number]. Topic: General - Other >> Oct 26, 2022 11:52 AM Everette C wrote: Reason for CRM: Medication Refill - Medication: metoprolol tartrate (LOPRESSOR) 100 MG tablet [id number]  ENDED  lisinopril (ZESTRIL) 40 MG tablet [id number]  potassium citrate (UROCIT-K) 10 MEQ (1080 MG) SR tablet [id number]  Has the patient contacted their pharmacy? Yes.   (Agent: If no, request that the patient contact the pharmacy for the refill. If patient does not wish to contact the pharmacy document the reason why and proceed with request.) (Agent: If yes, when and what did the pharmacy advise?)  Preferred Pharmacy (with phone number or street name): [facility] [id number], [place] [address] Phone: [phone] Fax: [phone] Hours: Not open 24 hours   Has the patient been seen for an appointment in the last year OR does the patient have an upcoming appointment? Yes.    Agent: Please be advised that RX refills may take up to 3 business days. We ask that you follow-up with your pharmacy.

## 2022-10-29 NOTE — Telephone Encounter (Signed)
Unable to refill per protocol, Rx request for Lisinopril and  Potassium are too soon.  Requested Prescriptions  Pending Prescriptions Disp Refills   metoprolol tartrate (LOPRESSOR) 100 MG tablet 270 tablet 2    Sig: Take 1 tablet (100 mg total) by mouth 3 (three) times daily.     Cardiovascular:  Beta Blockers Failed - 10/26/2022  1:44 PM      Failed - Last BP in normal range    BP Readings from Last 1 Encounters:  09/14/22 (!) 148/80         Passed - Last Heart Rate in normal range    Pulse Readings from Last 1 Encounters:  09/14/22 (!) 57         Passed - Valid encounter within last 6 months    Recent Outpatient Visits           2 months ago Essential (primary) hypertension   [facility] [place], DO   6 months ago Edema of left foot   [facility] Delsa Grana, PA-C   8 months ago Essential hypertension   [facility] Teodora Medici, [place]   11 months ago Essential hypertension   [place], DO   12 months ago Essential hypertension   [place], DO       Future Appointments             In 3 months Teodora Medici, [facility], [facility]             lisinopril (ZESTRIL) 40 MG tablet 90 tablet 3    Sig: Take 1 tablet (40 mg total) by mouth daily.     Cardiovascular:  ACE Inhibitors Failed - 10/26/2022  1:44 PM      Failed - Cr in normal range and within 180 days    Creat  Date Value Ref Range Status  05/01/2022 1.43 (H) 0.60 - 1.00 mg/dL Final         Failed - Last BP in normal range    BP Readings from Last 1 Encounters:  09/14/22 (!) 148/80         Passed - K in normal range and within 180 days    Potassium  Date Value Ref Range Status  05/01/2022 5.1 3.5 - 5.3 mmol/L Final  10/02/2012 3.5 3.5 - 5.1 mmol/L Final         Passed - Patient is  not pregnant      Passed - Valid encounter within last 6 months    Recent Outpatient Visits           2 months ago Essential (primary) hypertension   [place], DO   6 months ago Edema of left foot   [facility] Delsa Grana, PA-C   8 months ago Essential hypertension   [place], [place]   11 months ago Essential hypertension   [place], DO   12 months ago Essential hypertension   [facility] Myles Gip, DO       Future Appointments             In 3 months Teodora Medici, [facility], [facility]             potassium  citrate (UROCIT-K) 10 MEQ (1080 MG) SR tablet 60 tablet 1    Sig: Take 1 tablet (10 mEq total) by mouth 2 (two) times daily.     Endocrinology:  Minerals - Potassium Citrate Failed - 10/26/2022  1:44 PM      Failed - CO2 in normal range and within 120 days    CO2  Date Value Ref Range Status  05/01/2022 30 20 - 32 mmol/L Final   Co2  Date Value Ref Range Status  10/02/2012 24 21 - 32 mmol/L Final   Bicarbonate  Date Value Ref Range Status  05/21/2018 26.0 20.0 - 28.0 mmol/L Final         Failed - Cl in normal range and within 120 days    Chloride  Date Value Ref Range Status  05/01/2022 102 98 - 110 mmol/L Final  10/02/2012 105 98 - 107 mmol/L Final         Failed - Cr in normal range and within 120 days    Creat  Date Value Ref Range Status  05/01/2022 1.43 (H) 0.60 - 1.00 mg/dL Final         Failed - K in normal range and within 120 days    Potassium  Date Value Ref Range Status  05/01/2022 5.1 3.5 - 5.3 mmol/L Final  10/02/2012 3.5 3.5 - 5.1 mmol/L Final         Failed - Na in normal range and within 120 days    Sodium  Date Value Ref Range Status  05/01/2022 138 135 - 146 mmol/L Final  05/29/2021 144 134 -  144 mmol/L Final  10/02/2012 137 136 - 145 mmol/L Final         Failed - WBC in normal range and within 120 days    WBC  Date Value Ref Range Status  05/01/2022 6.9 3.8 - 10.8 Thousand/uL Final         Failed - HGB in normal range and within 120 days    Hemoglobin  Date Value Ref Range Status  05/01/2022 13.7 11.7 - 15.5 g/dL Final   HGB  Date Value Ref Range Status  10/02/2012 15.3 12.0 - 16.0 g/dL Final         Failed - HCT in normal range and within 120 days    HCT  Date Value Ref Range Status  05/01/2022 41.5 35.0 - 45.0 % Final  10/02/2012 44.6 35.0 - 47.0 % Final         Failed - PLT in normal range and within 120 days    Platelets  Date Value Ref Range Status  05/01/2022 249 140 - 400 Thousand/uL Final   Platelet  Date Value Ref Range Status  10/02/2012 262 150 - 440 x10 3/mm 3 Final         Failed - Urinalysis completed in last 4 months.    Specific Gravity, Urine  Date Value Ref Range Status  05/17/2018 1.017 1.005 - 1.030 Final   Glucose, UA  Date Value Ref Range Status  05/17/2018 NEGATIVE NEGATIVE mg/dL Final   Urobilinogen, UA  Date Value Ref Range Status  01/01/2011 0.2 0.0 - 1.0 mg/dL Final   Protein, ur  Date Value Ref Range Status  05/17/2018 30 (A) NEGATIVE mg/dL Final   Total Protein  Date Value Ref Range Status  05/01/2022 7.8 6.1 - 8.1 g/dL Final   Nitrite  Date Value Ref Range Status  05/17/2018 NEGATIVE NEGATIVE Final   RBC / HPF  Date Value Ref Range Status  05/17/2018 0-5 0 - 5 RBC/hpf Final   WBC, UA  Date Value Ref Range Status  05/17/2018 0-5 0 - 5 WBC/hpf Final         Passed - Valid encounter within last 4 months    Recent Outpatient Visits           2 months ago Essential (primary) hypertension   [place], DO   6 months ago Edema of left foot   [facility] Delsa Grana, PA-C   8 months ago Essential hypertension   [place], [place]   11 months ago Essential hypertension   [place], DO   12 months ago Essential hypertension   [facility] Myles Gip, DO       Future Appointments             In 3 months Teodora Medici, [facility], [facility]

## 2022-10-29 NOTE — Telephone Encounter (Signed)
Requested medication (s) are due for refill today: yes  Requested medication (s) are on the active medication list: yes  Last refill:  11/14/21  Future visit scheduled: yes  Notes to clinic:  Unable to refill per protocol, last refill by another provider.      Requested Prescriptions  Pending Prescriptions Disp Refills   metoprolol tartrate (LOPRESSOR) 100 MG tablet 270 tablet 2    Sig: Take 1 tablet (100 mg total) by mouth 3 (three) times daily.     Cardiovascular:  Beta Blockers Failed - 10/26/2022  1:44 PM      Failed - Last BP in normal range    BP Readings from Last 1 Encounters:  09/14/22 (!) 148/80         Passed - Last Heart Rate in normal range    Pulse Readings from Last 1 Encounters:  09/14/22 (!) 11         Passed - Valid encounter within last 6 months    Recent Outpatient Visits           2 months ago Essential (primary) hypertension   [facility] [place], DO   6 months ago Edema of left foot   [facility] Delsa Grana, PA-C   8 months ago Essential hypertension   [facility] Teodora Medici, [place]   11 months ago Essential hypertension   [facility] Rory Percy M, DO   12 months ago Essential hypertension   [place], DO       Future Appointments             In 3 months Teodora Medici, [facility], [facility]            Refused Prescriptions Disp Refills   lisinopril (ZESTRIL) 40 MG tablet 90 tablet 3    Sig: Take 1 tablet (40 mg total) by mouth daily.     Cardiovascular:  ACE Inhibitors Failed - 10/26/2022  1:44 PM      Failed - Cr in normal range and within 180 days    Creat  Date Value Ref Range Status  05/01/2022 1.43 (H) 0.60 - 1.00 mg/dL Final         Failed - Last BP in normal range    BP Readings from Last 1 Encounters:  09/14/22 (!)  148/80         Passed - K in normal range and within 180 days    Potassium  Date Value Ref Range Status  05/01/2022 5.1 3.5 - 5.3 mmol/L Final  10/02/2012 3.5 3.5 - 5.1 mmol/L Final         Passed - Patient is not pregnant      Passed - Valid encounter within last 6 months    Recent Outpatient Visits           2 months ago Essential (primary) hypertension   [place], DO   6 months ago Edema of left foot   [facility] Delsa Grana, PA-C   8 months ago Essential hypertension   [place], [place]   11 months ago Essential hypertension   [place], DO   12 months ago Essential hypertension   [facility] Myles Gip, [place]  Future Appointments             In 3 months Teodora Medici, [facility], [facility]             potassium citrate (UROCIT-K) 10 MEQ (1080 MG) SR tablet 60 tablet 1    Sig: Take 1 tablet (10 mEq total) by mouth 2 (two) times daily.     Endocrinology:  Minerals - Potassium Citrate Failed - 10/26/2022  1:44 PM      Failed - CO2 in normal range and within 120 days    CO2  Date Value Ref Range Status  05/01/2022 30 20 - 32 mmol/L Final   Co2  Date Value Ref Range Status  10/02/2012 24 21 - 32 mmol/L Final   Bicarbonate  Date Value Ref Range Status  05/21/2018 26.0 20.0 - 28.0 mmol/L Final         Failed - Cl in normal range and within 120 days    Chloride  Date Value Ref Range Status  05/01/2022 102 98 - 110 mmol/L Final  10/02/2012 105 98 - 107 mmol/L Final         Failed - Cr in normal range and within 120 days    Creat  Date Value Ref Range Status  05/01/2022 1.43 (H) 0.60 - 1.00 mg/dL Final         Failed - K in normal range and within 120 days    Potassium  Date Value Ref Range Status  05/01/2022 5.1 3.5 -  5.3 mmol/L Final  10/02/2012 3.5 3.5 - 5.1 mmol/L Final         Failed - Na in normal range and within 120 days    Sodium  Date Value Ref Range Status  05/01/2022 138 135 - 146 mmol/L Final  05/29/2021 144 134 - 144 mmol/L Final  10/02/2012 137 136 - 145 mmol/L Final         Failed - WBC in normal range and within 120 days    WBC  Date Value Ref Range Status  05/01/2022 6.9 3.8 - 10.8 Thousand/uL Final         Failed - HGB in normal range and within 120 days    Hemoglobin  Date Value Ref Range Status  05/01/2022 13.7 11.7 - 15.5 g/dL Final   HGB  Date Value Ref Range Status  10/02/2012 15.3 12.0 - 16.0 g/dL Final         Failed - HCT in normal range and within 120 days    HCT  Date Value Ref Range Status  05/01/2022 41.5 35.0 - 45.0 % Final  10/02/2012 44.6 35.0 - 47.0 % Final         Failed - PLT in normal range and within 120 days    Platelets  Date Value Ref Range Status  05/01/2022 249 140 - 400 Thousand/uL Final   Platelet  Date Value Ref Range Status  10/02/2012 262 150 - 440 x10 3/mm 3 Final         Failed - Urinalysis completed in last 4 months.    Specific Gravity, Urine  Date Value Ref Range Status  05/17/2018 1.017 1.005 - 1.030 Final   Glucose, UA  Date Value Ref Range Status  05/17/2018 NEGATIVE NEGATIVE mg/dL Final   Urobilinogen, UA  Date Value Ref Range Status  01/01/2011 0.2 0.0 - 1.0 mg/dL Final   Protein, ur  Date Value Ref Range Status  05/17/2018 30 (A) NEGATIVE mg/dL  Final   Total Protein  Date Value Ref Range Status  05/01/2022 7.8 6.1 - 8.1 g/dL Final   Nitrite  Date Value Ref Range Status  05/17/2018 NEGATIVE NEGATIVE Final   RBC / HPF  Date Value Ref Range Status  05/17/2018 0-5 0 - 5 RBC/hpf Final   WBC, UA  Date Value Ref Range Status  05/17/2018 0-5 0 - 5 WBC/hpf Final         Passed - Valid encounter within last 4 months    Recent Outpatient Visits           2 months ago Essential (primary) hypertension    [place], DO   6 months ago Edema of left foot   [facility] Delsa Grana, PA-C   8 months ago Essential hypertension   [place], [place]   11 months ago Essential hypertension   [place], DO   12 months ago Essential hypertension   [facility] Myles Gip, DO       Future Appointments             In 3 months Teodora Medici, [facility], [facility]

## 2022-11-06 ENCOUNTER — Other Ambulatory Visit: Payer: Self-pay | Admitting: *Deleted

## 2022-11-06 ENCOUNTER — Telehealth: Payer: Self-pay | Admitting: Internal Medicine

## 2022-11-06 NOTE — Telephone Encounter (Signed)
Requested medications are due for refill today.  Unsure  Requested medications are on the active medications list.  yes  Last refill. 11/14/2021 #270 2 rf  Future visit scheduled.   yes  Notes to clinic.  Rx written to expired 09/04/2022 Rx is expired.    Requested Prescriptions  Pending Prescriptions Disp Refills   metoprolol tartrate (LOPRESSOR) 100 MG tablet 270 tablet 2    Sig: Take 1 tablet (100 mg total) by mouth 3 (three) times daily.     Cardiovascular:  Beta Blockers Failed - 11/06/2022  3:21 PM      Failed - Last BP in normal range    BP Readings from Last 1 Encounters:  09/14/22 (!) 148/80         Passed - Last Heart Rate in normal range    Pulse Readings from Last 1 Encounters:  09/14/22 (!) 52         Passed - Valid encounter within last 6 months    Recent Outpatient Visits           2 months ago Essential (primary) hypertension   [place], DO   6 months ago Edema of left foot   [facility] Delsa Gr[facility], PA-C   8 months ago Essential hypertension   [place], [place]   11 months ago Essential hypertension   [facility] Myles Gip, DO   1 year ago Essential hypertension   [place], [place], DO       Future Appointments             In 3 months Teodora Medici, [facility], [facility]

## 2022-11-06 NOTE — Telephone Encounter (Unsigned)
Copied from [place] [id number]. Topic: General - Other >> Nov 06, 2022  2:33 PM Everette C wrote: Reason for CRM: Medication Refill - Medication: metoprolol tartrate (LOPRESSOR) 100 MG tablet [id number]    Has the patient contacted their pharmacy? Yes.   (Agent: If no, request that the patient contact the pharmacy for the refill. If patient does not wish to contact the pharmacy document the reason why and proceed with request.) (Agent: If yes, when and what did the pharmacy advise?)  Preferred Pharmacy (with phone number or street name): [facility] [id number], [place] [address] Phone: [phone] Fax: [phone] Hours: Not open 24 hours   Has the patient been seen for an appointment in the last year OR does the patient have an upcoming appointment? Yes.    Agent: Please be advised that RX refills may take up to 3 business days. We ask that you follow-up with your pharmacy.

## 2022-11-22 ENCOUNTER — Other Ambulatory Visit: Payer: Self-pay | Admitting: Family Medicine

## 2022-11-22 ENCOUNTER — Other Ambulatory Visit: Payer: Self-pay | Admitting: Internal Medicine

## 2022-11-22 DIAGNOSIS — F419 Anxiety disorder, unspecified: Secondary | ICD-10-CM

## 2022-11-22 DIAGNOSIS — K219 Gastro-esophageal reflux disease without esophagitis: Secondary | ICD-10-CM

## 2022-11-23 ENCOUNTER — Other Ambulatory Visit: Payer: Self-pay | Admitting: Internal Medicine

## 2022-11-23 DIAGNOSIS — K219 Gastro-esophageal reflux disease without esophagitis: Secondary | ICD-10-CM

## 2022-11-23 MED ORDER — PANTOPRAZOLE SODIUM 40 MG PO TBEC: 40.0000 mg | DELAYED_RELEASE_TABLET | Freq: Every day | ORAL | 0 refills | Status: DC

## 2022-11-23 NOTE — Telephone Encounter (Signed)
Unable to refill per protocol, Rx request is too soon. Last refill 08/08/22 for 90 and 1 refill.  Requested Prescriptions  Pending Prescriptions Disp Refills   busPIRone (BUSPAR) 5 MG tablet [Pharmacy Med Name: BUSPIRONE HCL 5 MG TABLET] 360 tablet 0    Sig: TAKE 1-2 TABLETS (5-10 MG TOTAL) BY MOUTH 2 (TWO) TIMES DAILY AS NEEDED (ANXIETY/NERVES/PANIC).     Psychiatry: Anxiolytics/Hypnotics - Non-controlled Passed - 11/22/2022 12:42 PM      Passed - Valid encounter within last 12 months    Recent Outpatient Visits           3 months ago Essential (primary) hypertension   [facility] Margarita Mail, DO   6 months ago Edema of left foot   [facility] Danelle Berry, PA-C   9 months ago Essential hypertension   [facility] Margarita Mail, DO   1 year ago Essential hypertension   [facility] Caro [facility], DO   1 year ago Essential hypertension   [facility] Caro Laroche, DO       Future Appointments             In 2 months Margarita Mail, DO [facility]             pantoprazole (PROTONIX) 40 MG tablet [Pharmacy Med Name: PANTOPRAZOLE SOD DR 40 MG TAB] 90 tablet 0    Sig: TAKE 1 TABLET BY MOUTH EVERY DAY     Gastroenterology: Proton Pump Inhibitors Passed - 11/22/2022 12:42 PM      Passed - Valid encounter within last 12 months    Recent Outpatient Visits           3 months ago Essential (primary) hypertension   [facility] Margarita Mail, DO   6 months ago Edema of left foot   [facility] Danelle Berry, PA-C   9 months ago Essential hypertension   [facility] Margarita Mail, DO   1 year ago Essential hypertension   [facility] Caro Laroche, DO   1 year ago Essential  hypertension   [facility] Caro Laroche, DO       Future Appointments             In 2 months Margarita Mail, DO [facility], [facility]

## 2022-11-23 NOTE — Telephone Encounter (Signed)
Medication Refill - Medication: pantoprazole (PROTONIX) 40 MG tablet  Pt has 2 pills left.  Has the patient contacted their pharmacy? Yes.   Pharmacy advised they sent the refill request and have not heard back from PCP.  Preferred Pharmacy (with phone number or street name): [id number] [place], [place] - [address]  Phone: [phone] Fax: [phone]  Has the patient been seen for an appointment in the last year OR does the patient have an upcoming appointment? Yes.    Agent: Please be advised that RX refills may take up to 3 business days. We ask that you follow-up with your pharmacy.

## 2022-11-23 NOTE — Telephone Encounter (Signed)
Requested medication (s) are due for refill today: yes  Requested medication (s) are on the active medication list: yes  Last refill:  11/14/21  Future visit scheduled: yes  Notes to clinic:  Unable to refill per protocol due to failed labs, no updated results. Last refill by another provider, routing for approval.      Requested Prescriptions  Pending Prescriptions Disp Refills   furosemide (LASIX) 20 MG tablet [Pharmacy Med Name: FUROSEMIDE 20 MG TABLET] 90 tablet 1    Sig: TAKE 1 TABLET BY MOUTH EVERY DAY     Cardiovascular:  Diuretics - Loop Failed - 11/22/2022 12:42 PM      Failed - K in normal range and within 180 days    Potassium  Date Value Ref Range Status  05/01/2022 5.1 3.5 - 5.3 mmol/L Final  10/02/2012 3.5 3.5 - 5.1 mmol/L Final         Failed - Ca in normal range and within 180 days    Calcium  Date Value Ref Range Status  05/01/2022 9.6 8.6 - 10.4 mg/dL Final   Calcium, Total  Date Value Ref Range Status  10/02/2012 8.7 8.5 - 10.1 mg/dL Final         Failed - Na in normal range and within 180 days    Sodium  Date Value Ref Range Status  05/01/2022 138 135 - 146 mmol/L Final  05/29/2021 144 134 - 144 mmol/L Final  10/02/2012 137 136 - 145 mmol/L Final         Failed - Cr in normal range and within 180 days    Creat  Date Value Ref Range Status  05/01/2022 1.43 (H) 0.60 - 1.00 mg/dL Final         Failed - Cl in normal range and within 180 days    Chloride  Date Value Ref Range Status  05/01/2022 102 98 - 110 mmol/L Final  10/02/2012 105 98 - 107 mmol/L Final         Failed - Mg Level in normal range and within 180 days    Magnesium  Date Value Ref Range Status  05/20/2018 1.9 1.7 - 2.4 mg/dL Final    Comment:    Performed at [facility], [address], [place], [place] [address]         Failed - Last BP in normal range    BP Readings from Last 1 Encounters:  09/14/22 (!) 148/80         Passed - Valid encounter within last 6  months    Recent Outpatient Visits           3 months ago Essential (primary) hypertension   [place] [facility] Margarita Mail, DO   6 months ago Edema of left foot   [facility] Danelle Berry, PA-C   9 months ago Essential hypertension   [facility] Margarita Mail, DO   1 year ago Essential hypertension   [facility] Caro Laroche, DO   1 year ago Essential hypertension   [facility] Caro Lar[facility], DO       Future Appointments             In 2 months Margarita Mail, DO [facility], [facility]

## 2022-11-23 NOTE — Telephone Encounter (Signed)
Requested Prescriptions  Pending Prescriptions Disp Refills   pantoprazole (PROTONIX) 40 MG tablet 90 tablet 0    Sig: Take 1 tablet (40 mg total) by mouth daily.     Gastroenterology: Proton Pump Inhibitors Passed - 11/23/2022 11:10 AM      Passed - Valid encounter within last 12 months    Recent Outpatient Visits           3 months ago Essential (primary) hypertension   [facility] Margarita Mail, DO   6 months ago Edema of left foot   [facility] Danelle Berry, PA-C   9 months ago Essential hypertension   [facility] Margarita Mail, DO   1 year ago Essential hypertension   [facility] Caro Laroche, DO   1 year ago Essential hypertension   [facility] Caro Laroche, DO       Future Appointments             In 2 months Margarita Mail, DO [facility], [facility]

## 2022-11-27 ENCOUNTER — Other Ambulatory Visit (INDEPENDENT_AMBULATORY_CARE_PROVIDER_SITE_OTHER): Payer: Self-pay | Admitting: Nurse Practitioner

## 2022-11-27 ENCOUNTER — Other Ambulatory Visit: Payer: Self-pay | Admitting: Internal Medicine

## 2022-11-27 DIAGNOSIS — I739 Peripheral vascular disease, unspecified: Secondary | ICD-10-CM

## 2022-11-27 DIAGNOSIS — I714 Abdominal aortic aneurysm, without rupture, unspecified: Secondary | ICD-10-CM

## 2022-11-27 DIAGNOSIS — I6523 Occlusion and stenosis of bilateral carotid arteries: Secondary | ICD-10-CM

## 2022-11-28 ENCOUNTER — Ambulatory Visit (INDEPENDENT_AMBULATORY_CARE_PROVIDER_SITE_OTHER): Payer: Medicare Other

## 2022-11-28 ENCOUNTER — Ambulatory Visit (INDEPENDENT_AMBULATORY_CARE_PROVIDER_SITE_OTHER): Payer: Medicare Other | Admitting: Nurse Practitioner

## 2022-11-28 VITALS — BP 152/69 | HR 62 | Resp 17 | Ht 61.0 in | Wt 150.8 lb

## 2022-11-28 DIAGNOSIS — E785 Hyperlipidemia, unspecified: Secondary | ICD-10-CM

## 2022-11-28 DIAGNOSIS — I6523 Occlusion and stenosis of bilateral carotid arteries: Secondary | ICD-10-CM | POA: Diagnosis not present

## 2022-11-28 DIAGNOSIS — I714 Abdominal aortic aneurysm, without rupture, unspecified: Secondary | ICD-10-CM

## 2022-11-28 DIAGNOSIS — I10 Essential (primary) hypertension: Secondary | ICD-10-CM

## 2022-11-28 NOTE — Telephone Encounter (Signed)
Requested Prescriptions  Pending Prescriptions Disp Refills   clopidogrel (PLAVIX) 75 MG tablet [Pharmacy Med Name: CLOPIDOGREL 75 MG TABLET] 90 tablet 1    Sig: TAKE 1 TABLET BY MOUTH EVERY DAY     Hematology: Antiplatelets - clopidogrel Failed - 11/27/2022  5:56 PM      Failed - HCT in normal range and within 180 days    HCT  Date Value Ref Range Status  05/01/2022 41.5 35.0 - 45.0 % Final  10/02/2012 44.6 35.0 - 47.0 % Final         Failed - HGB in normal range and within 180 days    Hemoglobin  Date Value Ref Range Status  05/01/2022 13.7 11.7 - 15.5 g/dL Final   HGB  Date Value Ref Range Status  10/02/2012 15.3 12.0 - 16.0 g/dL Final         Failed - PLT in normal range and within 180 days    Platelets  Date Value Ref Range Status  05/01/2022 249 140 - 400 Thousand/uL Final   Platelet  Date Value Ref Range Status  10/02/2012 262 150 - 440 x10 3/mm 3 Final         Failed - Cr in normal range and within 360 days    Creat  Date Value Ref Range Status  05/01/2022 1.43 (H) 0.60 - 1.00 mg/dL Final         Passed - Valid encounter within last 6 months    Recent Outpatient Visits           3 months ago Essential (primary) hypertension   [place] [facility] Margarita Mail, DO   7 months ago Edema of left foot   [facility] Danelle Berry, PA-C   9 months ago Essential hypertension   [facility] Margarita Mail, DO   1 year ago Essential hypertension   [facility] Caro Lar[facility], DO   1 year ago Essential hypertension   [facility] Caro Laroche, DO       Future Appointments             In 2 months Margarita Mail, DO [place] [facility], [facility]

## 2022-12-06 ENCOUNTER — Ambulatory Visit
Payer: Medicare Other | Attending: Student in an Organized Health Care Education/Training Program | Admitting: Student in an Organized Health Care Education/Training Program

## 2022-12-06 ENCOUNTER — Encounter: Payer: Self-pay | Admitting: Student in an Organized Health Care Education/Training Program

## 2022-12-06 VITALS — BP 149/65 | HR 58 | Temp 98.4°F | Ht 61.0 in | Wt 150.0 lb

## 2022-12-06 DIAGNOSIS — M1652 Unilateral post-traumatic osteoarthritis, left hip: Secondary | ICD-10-CM

## 2022-12-06 DIAGNOSIS — Z9889 Other specified postprocedural states: Secondary | ICD-10-CM | POA: Diagnosis not present

## 2022-12-06 DIAGNOSIS — G8921 Chronic pain due to trauma: Secondary | ICD-10-CM | POA: Insufficient documentation

## 2022-12-06 MED ORDER — HYDROCODONE-ACETAMINOPHEN 10-325 MG PO TABS: 1.0000 | ORAL_TABLET | ORAL | 0 refills | Status: DC | PRN

## 2022-12-06 NOTE — Progress Notes (Signed)
Nursing Pain Medication Assessment:  Safety precautions to be maintained throughout the outpatient stay will include: orient to surroundings, keep bed in low position, maintain call bell within reach at all times, provide assistance with transfer out of bed and ambulation.  Medication Inspection Compliance: Pill count conducted under aseptic conditions, in front of the patient. Neither the pills nor the bottle was removed from the patient's sight at any time. Once count was completed pills were immediately returned to the patient in their original bottle.  Medication: Hydrocodone/APAP Pill/Patch Count:  121 of 180 pills remain Pill/Patch Appearance: Markings consistent with prescribed medication Bottle Appearance: Standard pharmacy container. Clearly labeled. Filled Date: 4 / 22 / 2024 Last Medication intake:  TodaySafety precautions to be maintained throughout the outpatient stay will include: orient to surroundings, keep bed in low position, maintain call bell within reach at all times, provide assistance with transfer out of bed and ambulation.
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
## 2022-12-08 ENCOUNTER — Other Ambulatory Visit: Payer: Self-pay | Admitting: Internal Medicine

## 2022-12-10 NOTE — Telephone Encounter (Signed)
Requested Prescriptions  Pending Prescriptions Disp Refills   potassium citrate (UROCIT-K) 10 MEQ (1080 MG) SR tablet [Pharmacy Med Name: POTASSIUM CIT 1080MG  ( ) TABS] 180 tablet 0    Sig: TAKE ONE TABLET BY MOUTH TWICE DAILY     Endocrinology:  Minerals - Potassium Citrate Failed - 12/08/2022  3:46 AM      Failed - CO2 in normal range and within 120 days    CO2  Date Value Ref Range Status  05/01/2022 30 20 - 32 mmol/L Final   Co2  Date Value Ref Range Status  10/02/2012 24 21 - 32 mmol/L Final   Bicarbonate  Date Value Ref Range Status  05/21/2018 26.0 20.0 - 28.0 mmol/L Final         Failed - Cl in normal range and within 120 days    Chloride  Date Value Ref Range Status  05/01/2022 102 98 - 110 mmol/L Final  10/02/2012 105 98 - 107 mmol/L Final         Failed - Cr in normal range and within 120 days    Creat  Date Value Ref Range Status  05/01/2022 1.43 (H) 0.60 - 1.00 mg/dL Final         Failed - K in normal range and within 120 days    Potassium  Date Value Ref Range Status  05/01/2022 5.1 3.5 - 5.3 mmol/L Final  10/02/2012 3.5 3.5 - 5.1 mmol/L Final         Failed - Na in normal range and within 120 days    Sodium  Date Value Ref Range Status  05/01/2022 138 135 - 146 mmol/L Final  05/29/2021 144 134 - 144 mmol/L Final  10/02/2012 137 136 - 145 mmol/L Final         Failed - WBC in normal range and within 120 days    WBC  Date Value Ref Range Status  05/01/2022 6.9 3.8 - 10.8 Thousand/uL Final         Failed - HGB in normal range and within 120 days    Hemoglobin  Date Value Ref Range Status  05/01/2022 13.7 11.7 - 15.5 g/dL Final   HGB  Date Value Ref Range Status  10/02/2012 15.3 12.0 - 16.0 g/dL Final         Failed - HCT in normal range and within 120 days    HCT  Date Value Ref Range Status  05/01/2022 41.5 35.0 - 45.0 % Final  10/02/2012 44.6 35.0 - 47.0 % Final         Failed - PLT in normal range and within 120 days     Platelets  Date Value Ref Range Status  05/01/2022 249 140 - 400 Thousand/uL Final   Platelet  Date Value Ref Range Status  10/02/2012 262 150 - 440 x10 3/mm 3 Final         Failed - Urinalysis completed in last 4 months.    Specific Gravity, Urine  Date Value Ref Range Status  05/17/2018 1.017 1.005 - 1.030 Final   Glucose, UA  Date Value Ref Range Status  05/17/2018 NEGATIVE NEGATIVE mg/dL Final   Urobilinogen, UA  Date Value Ref Range Status  01/01/2011 0.2 0.0 - 1.0 mg/dL Final   Protein, ur  Date Value Ref Range Status  05/17/2018 30 (A) NEGATIVE mg/dL Final   Total Protein  Date Value Ref Range Status  05/01/2022 7.8 6.1 - 8.1 g/dL Final   Nitrite  Date Value Ref  Range Status  05/17/2018 NEGATIVE NEGATIVE Final   RBC / HPF  Date Value Ref Range Status  05/17/2018 0-5 0 - 5 RBC/hpf Final   WBC, UA  Date Value Ref Range Status  05/17/2018 0-5 0 - 5 WBC/hpf Final         Passed - Valid encounter within last 4 months    Recent Outpatient Visits           3 months ago Essential (primary) hypertension   [facility] Margarita Mail, DO   7 months ago Edema of left foot   [facility] Danelle Berry, PA-C   10 months ago Essential hypertension   [facility] Margarita Mail, DO   1 year ago Essential hypertension   [facility] Caro Laroche, DO   1 year ago Essential hypertension   [facility] Caro Laroche, DO       Future Appointments             In 2 months Margarita Mail, DO [facility], [facility]

## 2022-12-17 ENCOUNTER — Encounter (INDEPENDENT_AMBULATORY_CARE_PROVIDER_SITE_OTHER): Payer: Self-pay | Admitting: Nurse Practitioner

## 2022-12-17 NOTE — Progress Notes (Signed)
Subjective:    Patient ID: Elizabeth [name], female    DOB: 22-Nov-1940, 82 y.o.   MRN: [id number] Chief Complaint  Patient presents with   Follow-up    Elizabeth [name] is a 82 year old female that presents today for follow-up evaluation of her carotid disease as well as abdominal aortic aneurysm.  Currently she denies any TIA or CVA-like symptoms.  She denies any amaurosis fugax.  There is no abdominal pain or signs symptoms of distal embolization.  She also has a history of lower extremity revascularization.  She denies any current claudication-like symptoms.  Today the right ICA has a 1 to 39% stenosis of the left having an 60 to 79% stenosis.  Her previous study showed an 80 to 99% stenosis and studies today indicate that she is at the high end of that range.The bilateral vertebral arteries have antegrade flow with normal flow hemodynamics in the bilateral subclavian arteries.    Today her abdominal aortic aneurysm measures 4.90 cm in maximal diameter.  This is slightly increased from previous diameter measurement of 4.88 cm.  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    Review of Systems  Eyes:  Negative for visual disturbance.  All other systems reviewed and are negative.      Objective:   Physical Exam Vitals reviewed.  HENT:     Head: Normocephalic.  Neck:     Vascular: No carotid bruit.  Cardiovascular:     Rate and Rhythm: Normal rate.     Pulses: Normal pulses.  Pulmonary:     Effort: Pulmonary effort is normal.  Skin:    General: Skin is warm and dry.  Neurological:     Mental Status: She is alert and oriented to person, place, and time.  Psychiatric:        Mood and Affect: Mood normal.        Behavior: Behavior normal.        Thought Content: Thought content normal.        Judgment: Judgment normal.     BP (!) 152/69 (BP Location: Right Arm)   Pulse 62   Resp 17   Ht 5\' 1"  (1.549 m)   Wt 150 lb 12.8 oz (68.4 kg)   BMI 28.49 kg/m    Past Medical History:  Diagnosis Date   3-vessel CAD    05/2018 LHC with a single remaining conduit which is the left main coronary artery supplying the LAD.  Both the circumflex and right coronary artery were occluded.  The distal left main, proximal LAD is aneurysmal and heavily calcified.  The LAD supplies well-formed collaterals to the PDA.  Not felt to be candidate for revascularization and with recommendation for medical mgmt   AAA (abdominal aortic aneurysm) (HCC)    4.3cm (12/2018)   Celiac artery stenosis (HCC)    Degenerative joint disease of left hip    Heart failure with preserved ejection fraction (HCC)    EF 60-65%, 05/2018   History of non-ST elevation myocardial infarction (NSTEMI)    05/2018 with subsequent LHC and in the setting of acute aortic ulcer and hematoma   Hyperlipidemia    Hypertension    Internal carotid artery stenosis, left    80-99% 05/2018   Intramural hematoma of thoracic aorta (HCC)    05/2018   Mitral regurgitation    Mild by 05/2018 echo   Paroxysmal SVT (supraventricular tachycardia)    05/2018   Peripheral vascular disease (HCC)  extensive vascular dz s/p b/l carotid endarterectomies (2014) and iliac stenting, AAA, aortic ulcer   Right renal artery stenosis (HCC)    05/2018   Smoking hx    Quit ~1992   Status post bilateral carotid endarterectomy    Followed by Vascular surgery with most recent images 05/2018 showing L ICA 80-99% stenosis   Subclavian artery stenosis, left (HCC)    05/2018    Social History   Socioeconomic History   Marital status: Widowed    Spouse name: Not on file   Number of children: 2   Years of education: Not on file   Highest education level: GED or equivalent  Occupational History   Occupation: diabled  Tobacco Use   Smoking status: Former    Types: Cigarettes    Quit date: 02/02/1999    Years since quitting: 23.8   Smokeless tobacco: Never  Vaping Use   Vaping Use: Never used  Substance and  Sexual Activity   Alcohol use: No   Drug use: No   Sexual activity: Not Currently  Other Topics Concern   Not on file  Social History Narrative   Pt lives alone, does not drive but sister drives her when needed.    Social Determinants of Health   Financial Resource Strain: Low Risk  (05/01/2022)   Overall Financial Resource Strain (CARDIA)    Difficulty of Paying Living Expenses: Not hard at all  Food Insecurity: No Food Insecurity (05/01/2022)   Hunger Vital Sign    Worried About Running Out of Food in the Last Year: Never true    Ran Out of Food in the Last Year: Never true  Transportation Needs: No Transportation Needs (05/01/2022)   PRAPARE - Administrator, Civil Service (Medical): No    Lack of Transportation (Non-Medical): No  Physical Activity: Inactive (05/01/2022)   Exercise Vital Sign    Days of Exercise per Week: 0 days    Minutes of Exercise per Session: 0 min  Stress: No Stress Concern Present (05/01/2022)   [employer] of Occupational Health - Occupational Stress Questionnaire    Feeling of Stress : Not at all  Social Connections: Moderately Integrated (05/01/2022)   Social Connection and Isolation Panel [NHANES]    Frequency of Communication with Friends and Family: More than three times a week    Frequency of Social Gatherings with Friends and Family: More than three times a week    Attends Religious Services: More than 4 times per year    Active Member of [employer] or Organizations: Yes    Attends Banker Meetings: More than 4 times per year    Marital Status: Widowed  Intimate Partner Violence: Not At Risk (05/01/2022)   Humiliation, Afraid, Rape, and Kick questionnaire    Fear of Current or Ex-Partner: No    Emotionally Abused: No    Physically Abused: No    Sexually Abused: No    Past Surgical History:  Procedure Laterality Date   APPENDECTOMY     CARDIAC CATHETERIZATION     HIP SURGERY Left    x3   LEFT HEART CATH AND  CORONARY ANGIOGRAPHY N/A 05/19/2018   Procedure: LEFT HEART CATH AND CORONARY ANGIOGRAPHY;  Surgeon: Lyn Records, MD;  Location: [facility] INVASIVE CV LAB;  Service: Cardiovascular;  Laterality: N/A;   PERIPHERAL VASCULAR CATHETERIZATION Right 02/02/2016   Procedure: Lower Extremity Angiography;  Surgeon: Annice Needy, MD;  Location: [facility] INVASIVE CV LAB;  Service: Cardiovascular;  Laterality:  Right;    Family History  Problem Relation Age of Onset   Hypertension Mother    Stroke Mother    Hypertension Father    Stroke Father    Hypertension Sister    Hyperlipidemia Sister    Hodgkin's lymphoma Son    Heart disease Son    Kidney disease Son    Mental illness Neg Hx     Allergies  Allergen Reactions   Carvedilol Other (See Comments)    intolerance   Codeine Other (See Comments)    Reaction: Unsure   Hydralazine Other (See Comments)    intolerance   Sulfa Antibiotics Other (See Comments)    Mouth blisters       Latest Ref Rng & Units 05/01/2022    2:14 PM 08/16/2021   10:01 AM 02/08/2021   11:22 AM  CBC  WBC 3.8 - 10.8 Thousand/uL 6.9  6.4  6.1   Hemoglobin 11.7 - 15.5 g/dL 16.1  09.6  04.5   Hematocrit 35.0 - 45.0 % 41.5  42.9  44.9   Platelets 140 - 400 Thousand/uL 249  221  202       CMP     Component Value Date/Time   NA 138 05/01/2022 1414   NA 144 05/29/2021 1349   NA 137 10/02/2012 0608   K 5.1 05/01/2022 1414   K 3.5 10/02/2012 0608   CL 102 05/01/2022 1414   CL 105 10/02/2012 0608   CO2 30 05/01/2022 1414   CO2 24 10/02/2012 0608   GLUCOSE 80 05/01/2022 1414   GLUCOSE 124 (H) 10/02/2012 0608   BUN 29 (H) 05/01/2022 1414   BUN 32 (H) 05/29/2021 1349   BUN 8 10/02/2012 0608   CREATININE 1.43 (H) 05/01/2022 1414   CALCIUM 9.6 05/01/2022 1414   CALCIUM 8.7 10/02/2012 0608   PROT 7.8 05/01/2022 1414   ALBUMIN 2.8 (L) 05/21/2018 0430   AST 21 05/01/2022 1414   ALT 12 05/01/2022 1414   ALKPHOS 62 05/18/2018 0617   BILITOT 0.4 05/01/2022 1414   GFRNONAA 56  (L) 10/25/2020 1007   GFRAA 65 10/25/2020 1007     No results found.     Assessment & Plan:   1. Bilateral carotid artery stenosis Duplex today shows 1-39% right ICA stenosis and 80 to 99% left ICA stenosis.  She has now had 3 duplex studies that have suggested high-grade lesions in the past couple of years.   At this point, with her medical comorbidities general anesthesia for carotid endarterectomy would be suboptimal.  I discussed the role of carotid angiogram with expected stenting.  She does not want to have this done and says that she understands her stroke risk is significantly higher with medical management alone but is not interested in having any procedures.  I will plan to see her back with her follow-up studies for her aneurysm and her carotid in 6 months, but if she reconsiders I would be happy to offer her carotid angiogram and possible stenting.  2. Abdominal aortic aneurysm (AAA) without rupture, unspecified part (HCC) Recommend: No surgery or intervention is indicated at this time.  The patient has an asymptomatic abdominal aortic aneurysm that is greater than 4 cm but less than 5 cm in maximal diameter.    I have reviewed the natural history of abdominal aortic aneurysm and the small risk of rupture for aneurysm less than 5 cm in size.  However, as these small aneurysms tend to enlarge over time, continued surveillance with  ultrasound or CT scan is mandatory.   I have also discussed optimizing medical management with hypertension and lipid control and the importance of abstinence from tobacco.  The patient is also encouraged to exercise a minimum of 30 minutes 4 times a week.   Should the patient develop new onset abdominal or back pain or signs of peripheral embolization they are instructed to seek medical attention immediately and to alert the physician providing care that they have an aneurysm.   The patient voices their understanding.  I have scheduled the patient to  return in 6 months with an aortic duplex.   3. Essential hypertension Continue antihypertensive medications as already ordered, these medications have been reviewed and there are no changes at this time.   4. Hyperlipidemia, unspecified hyperlipidemia type Continue statin as ordered and reviewed, no changes at this time    Current Outpatient Medications on File Prior to Visit  Medication Sig Dispense Refill   amLODipine (NORVASC) 10 MG tablet Take 1 tablet (10 mg total) by mouth daily. 90 tablet 1   aspirin EC 81 MG tablet Take 1 tablet (81 mg total) by mouth daily. Swallow whole. 90 tablet 3   busPIRone (BUSPAR) 5 MG tablet TAKE 1-2 TABLETS (5-10 MG TOTAL) BY MOUTH 2 (TWO) TIMES DAILY AS NEEDED (ANXIETY/NERVES/PANIC). 360 tablet 0   cholecalciferol (VITAMIN D) 1000 units tablet Take 1,000 Units by mouth daily.     citalopram (CELEXA) 20 MG tablet TAKE 1 TABLET BY MOUTH EVERYDAY AT BEDTIME 90 tablet 1   clopidogrel (PLAVIX) 75 MG tablet Take 1 tablet (75 mg total) by mouth daily. 90 tablet 1   Cyanocobalamin (VITAMIN B-12 PO) Take 1 tablet by mouth daily.     furosemide (LASIX) 20 MG tablet TAKE 1 TABLET BY MOUTH EVERY DAY 90 tablet 1   glucosamine-chondroitin 500-400 MG tablet Take 1 tablet by mouth daily.     HYDROcodone-acetaminophen (NORCO) 10-325 MG tablet Take 1 tablet by mouth every 4 (four) hours as needed for severe pain. Must last 30 days. 180 tablet 0   isosorbide mononitrate (IMDUR) 30 MG 24 hr tablet TAKE 1/2 OF A TABLET (15 MG TOTAL) BY MOUTH DAILY 45 tablet 3   lisinopril (ZESTRIL) 40 MG tablet Take 1 tablet (40 mg total) by mouth daily. 90 tablet 3   METRONIDAZOLE, TOPICAL, 0.75 % LOTN APPLY 1 APPLICATION. TOPICALLY 2 (TWO) TIMES DAILY. AFTER WASHING. 59 mL 0   Multiple Vitamin (MULTIVITAMIN) tablet Take 1 tablet by mouth daily.     nitroGLYCERIN (NITROSTAT) 0.4 MG SL tablet Place 1 tablet (0.4 mg total) under the tongue every 5 (five) minutes as needed for chest pain. 15  tablet 0   pantoprazole (PROTONIX) 40 MG tablet Take 1 tablet (40 mg total) by mouth daily. 90 tablet 0   rosuvastatin (CRESTOR) 40 MG tablet Take 1 tablet (40 mg total) by mouth daily. 90 tablet 3   metoprolol tartrate (LOPRESSOR) 100 MG tablet Take 1 tablet (100 mg total) by mouth 3 (three) times daily. 270 tablet 2   No current facility-administered medications on file prior to visit.    There are no Patient Instructions on file for this visit. No follow-ups on file.   Georgiana Spinner, NP

## 2023-01-11 ENCOUNTER — Other Ambulatory Visit: Payer: Self-pay | Admitting: Internal Medicine

## 2023-01-11 DIAGNOSIS — I10 Essential (primary) hypertension: Secondary | ICD-10-CM

## 2023-01-11 MED ORDER — ISOSORBIDE MONONITRATE ER 30 MG PO TB24
ORAL_TABLET | ORAL | 2 refills | Status: DC
Start: 2023-01-11 — End: 2023-08-15

## 2023-01-11 NOTE — Telephone Encounter (Signed)
Requested Prescriptions  Pending Prescriptions Disp Refills   isosorbide mononitrate (IMDUR) 30 MG 24 hr tablet 45 tablet 2    Sig: TAKE 1/2 OF A TABLET (15 MG TOTAL) BY MOUTH DAILY     Cardiovascular:  Nitrates Failed - 01/11/2023 11:13 AM      Failed - Last BP in normal range    BP Readings from Last 1 Encounters:  12/06/22 (!) 149/65         Passed - Last Heart Rate in normal range    Pulse Readings from Last 1 Encounters:  12/06/22 (!) 58         Passed - Valid encounter within last 12 months    Recent Outpatient Visits           4 months ago Essential (primary) hypertension   [facility] [place], DO   8 months ago Edema of left foot   [facility] Danelle Berry, PA-C   11 months ago Essential hypertension   [facility] Margarita Mail, DO   1 year ago Essential hypertension   [facility] Caro Laroche, DO   1 year ago Essential hypertension   [facility] Caro Laroche, DO       Future Appointments             In 1 month Margarita Mail, DO [facility], [facility]

## 2023-01-11 NOTE — Telephone Encounter (Signed)
Medication Refill - Medication: isosorbide mononitrate (IMDUR) 30 MG 24 hr tablet   Has the patient contacted their pharmacy? No /pt switching pharmacy (Agent: If no, request that the patient contact the pharmacy for the refill. If patient does not wish to contact the pharmacy document the reason why and proceed with request.) (Agent: If yes, when and what did the pharmacy advise?)pt called directly in  Preferred Pharmacy (with phone number or street name): [facility]  [address] [place], [place], [place] [address]   [phone] Has the patient been seen for an appointment in the last year OR does the patient have an upcoming appointment? yes  Agent: Please be advised that RX refills may take up to 3 business days. We ask that you follow-up with your pharmacy.

## 2023-01-14 ENCOUNTER — Other Ambulatory Visit: Payer: Self-pay | Admitting: Family Medicine

## 2023-01-14 DIAGNOSIS — I10 Essential (primary) hypertension: Secondary | ICD-10-CM

## 2023-01-15 NOTE — Telephone Encounter (Signed)
Requested Prescriptions  Refused Prescriptions Disp Refills   isosorbide mononitrate (IMDUR) 30 MG 24 hr tablet [Pharmacy Med Name: ISOSORBIDE MONONIT ER 30 MG TB] 45 tablet 3    Sig: TAKE 1/2 OF A TABLET (15 MG TOTAL) BY MOUTH DAILY     Cardiovascular:  Nitrates Failed - 01/14/2023  2:33 AM      Failed - Last BP in normal range    BP Readings from Last 1 Encounters:  12/06/22 (!) 149/65         Passed - Last Heart Rate in normal range    Pulse Readings from Last 1 Encounters:  12/06/22 (!) 58         Passed - Valid encounter within last 12 months    Recent Outpatient Visits           5 months ago Essential (primary) hypertension   [facility] [place], DO   8 months ago Edema of left foot   [facility] Danelle Berry, PA-C   11 months ago Essential hypertension   [facility] Margarita Mail, DO   1 year ago Essential hypertension   [facility] Caro Laroche, DO   1 year ago Essential hypertension   [facility] Caro Laroche, DO       Future Appointments             In 1 month Margarita Mail, DO [facility], [facility]

## 2023-02-07 ENCOUNTER — Other Ambulatory Visit: Payer: Self-pay | Admitting: Internal Medicine

## 2023-02-08 NOTE — Telephone Encounter (Signed)
Requested medication (s) are due for refill today: yes  Requested medication (s) are on the active medication list: yes  Last refill:  12/30/22 #180   Future visit scheduled: yes  Notes to clinic:  overdue lab work    Requested Prescriptions  Pending Prescriptions Disp Refills   potassium citrate (UROCIT-K) 10 MEQ (1080 MG) SR tablet [Pharmacy Med Name: POTASSIUM CIT 1080MG  ( ) TABS] 180 tablet 0    Sig: TAKE 1 TABLET BY MOUTH TWICE DAILY     Endocrinology:  Minerals - Potassium Citrate Failed - 02/07/2023 10:03 PM      Failed - CO2 in normal range and within 120 days    CO2  Date Value Ref Range Status  05/01/2022 30 20 - 32 mmol/L Final   Co2  Date Value Ref Range Status  10/02/2012 24 21 - 32 mmol/L Final   Bicarbonate  Date Value Ref Range Status  05/21/2018 26.0 20.0 - 28.0 mmol/L Final         Failed - Cl in normal range and within 120 days    Chloride  Date Value Ref Range Status  05/01/2022 102 98 - 110 mmol/L Final  10/02/2012 105 98 - 107 mmol/L Final         Failed - Cr in normal range and within 120 days    Creat  Date Value Ref Range Status  05/01/2022 1.43 (H) 0.60 - 1.00 mg/dL Final         Failed - K in normal range and within 120 days    Potassium  Date Value Ref Range Status  05/01/2022 5.1 3.5 - 5.3 mmol/L Final  10/02/2012 3.5 3.5 - 5.1 mmol/L Final         Failed - Na in normal range and within 120 days    Sodium  Date Value Ref Range Status  05/01/2022 138 135 - 146 mmol/L Final  05/29/2021 144 134 - 144 mmol/L Final  10/02/2012 137 136 - 145 mmol/L Final         Failed - WBC in normal range and within 120 days    WBC  Date Value Ref Range Status  05/01/2022 6.9 3.8 - 10.8 Thousand/uL Final         Failed - HGB in normal range and within 120 days    Hemoglobin  Date Value Ref Range Status  05/01/2022 13.7 11.7 - 15.5 g/dL Final   HGB  Date Value Ref Range Status  10/02/2012 15.3 12.0 - 16.0 g/dL Final         Failed -  HCT in normal range and within 120 days    HCT  Date Value Ref Range Status  05/01/2022 41.5 35.0 - 45.0 % Final  10/02/2012 44.6 35.0 - 47.0 % Final         Failed - PLT in normal range and within 120 days    Platelets  Date Value Ref Range Status  05/01/2022 249 140 - 400 Thousand/uL Final   Platelet  Date Value Ref Range Status  10/02/2012 262 150 - 440 x10 3/mm 3 Final         Failed - Valid encounter within last 4 months    Recent Outpatient Visits           5 months ago Essential (primary) hypertension   [facility] Margarita Mail, DO   9 months ago Edema of left foot   [facility] Danelle Berry, [place]  12 months ago Essential hypertension   [facility] Margarita Mail, DO   1 year ago Essential hypertension   [facility] Caro Laroche, DO   1 year ago Essential hypertension   [facility] Caro Laroche, DO       Future Appointments             In 6 days Margarita Mail, DO [facility], [facility]            Failed - Urinalysis completed in last 4 months.    Specific Gravity, Urine  Date Value Ref Range Status  05/17/2018 1.017 1.005 - 1.030 Final   Glucose, UA  Date Value Ref Range Status  05/17/2018 NEGATIVE NEGATIVE mg/dL Final   Urobilinogen, UA  Date Value Ref Range Status  01/01/2011 0.2 0.0 - 1.0 mg/dL Final   Protein, ur  Date Value Ref Range Status  05/17/2018 30 (A) NEGATIVE mg/dL Final   Total Protein  Date Value Ref Range Status  05/01/2022 7.8 6.1 - 8.1 g/dL Final   Nitrite  Date Value Ref Range Status  05/17/2018 NEGATIVE NEGATIVE Final   RBC / HPF  Date Value Ref Range Status  05/17/2018 0-5 0 - 5 RBC/hpf Final   WBC, UA  Date Value Ref Range Status  05/17/2018 0-5 0 - 5 WBC/hpf Final

## 2023-02-13 NOTE — Progress Notes (Unsigned)
Established Patient Office Visit  Subjective:  Patient ID: Elizabeth [name], female    DOB: 1941-03-17  Age: 82 y.o. MRN: [id number]  CC:  No chief complaint on file.   HPI  Patient here for follow up on chronic medical conditions.   Hypertension: -Medications: Lisinopril 40 mg, Metoprolol 100 mg TID, Amlodipine 10 mg, Imdur 30 mg, Lasix 20 mg - will take if at home but won't take it if going out due to urinary frequency. Takes about 5 times a week  -Patient is compliant with above medications and reports no side effects. -Checking BP at home (average): 125-150/75-85 -Denies any SOB, CP, vision changes, LE edema or symptoms of hypotension. Will get facial flushing if BP high but this hasn't happened in awhile.  -Follows with Cardiology, note from 07/07/21 reviewed. Patient will call to get appointment for this year.   HLD/CAD/PVD/AAA: -Medications: Crestor 40 mg, Plavix 75 mg, aspirin 81, Vasecpa (cannot afford will switch to fish oil) but is taking over the counter fish oil  -Patient is compliant with above medications and reports no side effects. No abnormal bleeding.  -Last lipid panel: Lipid Panel     Component Value Date/Time   CHOL 138 08/16/2021 1001   TRIG 256 (H) 08/16/2021 1001   HDL 39 (L) 08/16/2021 1001   CHOLHDL 3.5 08/16/2021 1001   VLDL 28 05/20/2018 0249   LDLCALC 69 08/16/2021 1001  -Following with vascular surgery, last seen 02/26/22. US showed 1-39% on right ICA and 80-99% on left ICA but surgery not recommended. AAA measuring 4  cm, monitoring with [place].  GERD: -At last office visit we discussed her ongoing epigastric burning pain and increased belching and she was started on Protonix 40 mg daily which she states she is doing well with and her symtoms are better controlled but she is still taking Tums daily  -Denies abdominal pain, nausea, vomiting. Appetite good, weight stable.  Anxiety: -Currently on Celexa 20 mg, Buspar 5 once a day, sometimes will take  twice a day -Moods stable, doing well on current doses -Daily compliance without side effects  Health Maintenance: -Blood work UTD -Breast cancer screening: no longer screening per patient's wishes -Colon cancer screening: no longer screening per patient's wishes   Past Medical History:  Diagnosis Date   3-vessel CAD    05/2018 LHC with a single remaining conduit which is the left main coronary artery supplying the LAD.  Both the circumflex and right coronary artery were occluded.  The distal left main, proximal LAD is aneurysmal and heavily calcified.  The LAD supplies well-formed collaterals to the PDA.  Not felt to be candidate for revascularization and with recommendation for medical mgmt   AAA (abdominal aortic aneurysm) (HCC)    4.3cm (12/2018)   Celiac artery stenosis (HCC)    Degenerative joint disease of left hip    Heart failure with preserved ejection fraction (HCC)    EF 60-65%, 05/2018   History of non-ST elevation myocardial infarction (NSTEMI)    05/2018 with subsequent LHC and in the setting of acute aortic ulcer and hematoma   Hyperlipidemia    Hypertension    Internal carotid artery stenosis, left    80-99% 05/2018   Intramural hematoma of thoracic aorta (HCC)    05/2018   Mitral regurgitation    Mild by 05/2018 echo   Paroxysmal SVT (supraventricular tachycardia)    05/2018   Peripheral vascular disease (HCC)    extensive vascular dz s/p b/l carotid endarterectomies (  2014) and iliac stenting, AAA, aortic ulcer   Right renal artery stenosis (HCC)    05/2018   Smoking hx    Quit ~1992   Status post bilateral carotid endarterectomy    Followed by Vascular surgery with most recent images 05/2018 showing L ICA 80-99% stenosis   Subclavian artery stenosis, left (HCC)    05/2018    Past Surgical History:  Procedure Laterality Date   APPENDECTOMY     CARDIAC CATHETERIZATION     HIP SURGERY Left    x3   LEFT HEART CATH AND CORONARY ANGIOGRAPHY N/A 05/19/2018    Procedure: LEFT HEART CATH AND CORONARY ANGIOGRAPHY;  Surgeon: Lyn Records, MD;  Location: [facility]IVE [facility];  Service: Cardiovascular;  Laterality: N/A;   PERIPHERAL VASCULAR CATHETERIZATION Right 02/02/2016   Procedure: Lower Extremity Angiography;  Surgeon: Annice Needy, MD;  Location: [facility] INVASIVE CV LAB;  Service: Cardiovascular;  Laterality: Right;    Family History  Problem Relation Age of Onset   Hypertension Mother    Stroke Mother    Hypertension Father    Stroke Father    Hypertension Sister    Hyperlipidemia Sister    Hodgkin's lymphoma Son    Heart disease Son    Kidney disease Son    Mental illness Neg Hx     Social History   Socioeconomic History   Marital status: Widowed    Spouse name: Not on file   Number of children: 2   Years of education: Not on file   Highest education level: GED or equivalent  Occupational History   Occupation: diabled  Tobacco Use   Smoking status: Former    Types: Cigarettes    Quit date: 02/02/1999    Years since quitting: 24.0   Smokeless tobacco: Never  Vaping Use   Vaping Use: Never used  Substance and Sexual Activity   Alcohol use: No   Drug use: No   Sexual activity: Not Currently  Other Topics Concern   Not on file  Social History Narrative   Pt lives alone, does not drive but sister drives her when needed.    Social Determinants of Health   Financial Resource Strain: Low Risk  (02/10/2023)   Overall Financial Resource Strain (CARDIA)    Difficulty of Paying Living Expenses: Not hard at all  Food Insecurity: No Food Insecurity (02/10/2023)   Hunger Vital Sign    Worried About Running Out of Food in the Last Year: Never true    Ran Out of Food in the Last Year: Never true  Transportation Needs: No Transportation Needs (02/10/2023)   PRAPARE - Administrator, Civil Service (Medical): No    Lack of Transportation (Non-Medical): No  Physical Activity: Inactive (02/10/2023)   Exercise Vital Sign    Days  of Exercise per Week: 0 days    Minutes of Exercise per Session: 0 min  Stress: No Stress Concern Present (05/01/2022)   [employer] of Occupational Health - Occupational Stress Questionnaire    Feeling of Stress : Not at all  Social Connections: Moderately Integrated (02/10/2023)   Social Connection and Isolation Panel [NHANES]    Frequency of Communication with Friends and Family: More than three times a week    Frequency of Social Gatherings with Friends and Family: More than three times a week    Attends Religious Services: 1 to 4 times per year    Active Member of Clubs or Organizations: No  Attends Banker Meetings: More than 4 times per year    Marital Status: Widowed  Intimate Partner Violence: Not At Risk (05/01/2022)   Humiliation, Afraid, Rape, and Kick questionnaire    Fear of Current or Ex-Partner: No    Emotionally Abused: No    Physically Abused: No    Sexually Abused: No    Outpatient Medications Prior to Visit  Medication Sig Dispense Refill   amLODipine (NORVASC) 10 MG tablet Take 1 tablet (10 mg total) by mouth daily. 90 tablet 1   aspirin EC 81 MG tablet Take 1 tablet (81 mg total) by mouth daily. Swallow whole. 90 tablet 3   busPIRone (BUSPAR) 5 MG tablet TAKE 1-2 TABLETS (5-10 MG TOTAL) BY MOUTH 2 (TWO) TIMES DAILY AS NEEDED (ANXIETY/NERVES/PANIC). 360 tablet 0   cholecalciferol (VITAMIN D) 1000 units tablet Take 1,000 Units by mouth daily.     citalopram (CELEXA) 20 MG tablet TAKE 1 TABLET BY MOUTH EVERYDAY AT BEDTIME 90 tablet 1   clopidogrel (PLAVIX) 75 MG tablet Take 1 tablet (75 mg total) by mouth daily. 90 tablet 1   Cyanocobalamin (VITAMIN B-12 PO) Take 1 tablet by mouth daily.     furosemide (LASIX) 20 MG tablet TAKE 1 TABLET BY MOUTH EVERY DAY 90 tablet 1   glucosamine-chondroitin 500-400 MG tablet Take 1 tablet by mouth daily.     HYDROcodone-acetaminophen (NORCO) 10-325 MG tablet Take 1 tablet by mouth every 4 (four) hours as needed  for severe pain. Must last 30 days. 180 tablet 0   HYDROcodone-acetaminophen (NORCO) 10-325 MG tablet Take 1 tablet by mouth every 4 (four) hours as needed for severe pain. Must last 30 days. 180 tablet 0   HYDROcodone-acetaminophen (NORCO) 10-325 MG tablet Take 1 tablet by mouth every 4 (four) hours as needed for severe pain. Must last 30 days. 180 tablet 0   [START ON 02/22/2023] HYDROcodone-acetaminophen (NORCO) 10-325 MG tablet Take 1 tablet by mouth every 4 (four) hours as needed for severe pain. Must last 30 days. 180 tablet 0   isosorbide mononitrate (IMDUR) 30 MG 24 hr tablet TAKE 1/2 OF A TABLET (15 MG TOTAL) BY MOUTH DAILY 45 tablet 2   lisinopril (ZESTRIL) 40 MG tablet Take 1 tablet (40 mg total) by mouth daily. 90 tablet 3   metoprolol tartrate (LOPRESSOR) 100 MG tablet Take 1 tablet (100 mg total) by mouth 3 (three) times daily. 270 tablet 2   METRONIDAZOLE, TOPICAL, 0.75 % LOTN APPLY 1 APPLICATION. TOPICALLY 2 (TWO) TIMES DAILY. AFTER WASHING. 59 mL 0   Multiple Vitamin (MULTIVITAMIN) tablet Take 1 tablet by mouth daily.     nitroGLYCERIN (NITROSTAT) 0.4 MG SL tablet Place 1 tablet (0.4 mg total) under the tongue every 5 (five) minutes as needed for chest pain. 15 tablet 0   pantoprazole (PROTONIX) 40 MG tablet Take 1 tablet (40 mg total) by mouth daily. 90 tablet 0   potassium citrate (UROCIT-K) 10 MEQ (1080 MG) SR tablet TAKE 1 TABLET BY MOUTH TWICE DAILY 180 tablet 0   rosuvastatin (CRESTOR) 40 MG tablet Take 1 tablet (40 mg total) by mouth daily. 90 tablet 3   No facility-administered medications prior to visit.    Allergies  Allergen Reactions   Carvedilol Other (See Comments)    intolerance   Codeine Other (See Comments)    Reaction: Unsure   Hydralazine Other (See Comments)    intolerance   Sulfa Antibiotics Other (See Comments)    Mouth blisters  ROS Review of Systems  Constitutional:  Negative for chills and fever.  Eyes:  Negative for visual disturbance.   Respiratory:  Negative for cough and shortness of breath.   Cardiovascular:  Negative for chest pain.  Gastrointestinal:  Negative for abdominal pain, blood in stool, nausea and vomiting.  Genitourinary:  Negative for hematuria.  Neurological:  Negative for dizziness and headaches.  Hematological:  Does not bruise/bleed easily.      Objective:    Physical Exam Constitutional:      Appearance: Normal appearance.  HENT:     Head: Normocephalic and atraumatic.     Mouth/Throat:     Mouth: Mucous membranes are moist.     Pharynx: Oropharynx is clear.  Eyes:     Conjunctiva/sclera: Conjunctivae normal.  Cardiovascular:     Rate and Rhythm: Normal rate and regular rhythm.  Pulmonary:     Effort: Pulmonary effort is normal.     Breath sounds: Normal breath sounds.  Musculoskeletal:     Right lower leg: No edema.     Left lower leg: No edema.  Skin:    General: Skin is warm and dry.  Neurological:     General: No focal deficit present.     Mental Status: She is alert. Mental status is at baseline.  Psychiatric:        Mood and Affect: Mood normal.        Behavior: Behavior normal.     There were no vitals taken for this visit. Wt Readings from Last 3 Encounters:  12/06/22 150 lb (68 kg)  11/28/22 150 lb 12.8 oz (68.4 kg)  09/14/22 151 lb (68.5 kg)     Health Maintenance Due  Topic Date Due   Zoster Vaccines- Shingrix (1 of 2) Never done   DEXA SCAN  Never done   COVID-19 Vaccine (7 - 2023-24 season) 04/06/2022     There are no preventive care reminders to display for this patient.  Lab Results  Component Value Date   TSH 1.43 08/04/2020   Lab Results  Component Value Date   WBC 6.9 05/01/2022   HGB 13.7 05/01/2022   HCT 41.5 05/01/2022   MCV 101.7 (H) 05/01/2022   PLT 249 05/01/2022   Lab Results  Component Value Date   NA 138 05/01/2022   K 5.1 05/01/2022   CO2 30 05/01/2022   GLUCOSE 80 05/01/2022   BUN 29 (H) 05/01/2022   CREATININE 1.43 (H)  05/01/2022   BILITOT 0.4 05/01/2022   ALKPHOS 62 05/18/2018   AST 21 05/01/2022   ALT 12 05/01/2022   PROT 7.8 05/01/2022   ALBUMIN 2.8 (L) 05/21/2018   CALCIUM 9.6 05/01/2022   ANIONGAP 10 03/13/2020   EGFR 37 (L) 05/01/2022   Lab Results  Component Value Date   CHOL 138 08/16/2021   Lab Results  Component Value Date   HDL 39 (L) 08/16/2021   Lab Results  Component Value Date   LDLCALC 69 08/16/2021   Lab Results  Component Value Date   TRIG 256 (H) 08/16/2021   Lab Results  Component Value Date   CHOLHDL 3.5 08/16/2021   Lab Results  Component Value Date   HGBA1C 5.3 09/13/2022      Assessment & Plan:   1. Essential (primary) hypertension: Chronic and stable.  Continue lisinopril 40 mg, metoprolol 100 mg 3 times daily, amlodipine 10 mg, Imdur 30 mg and Lasix 20 mg as needed.  Amlodipine and Imdur refilled today.  Follow-up in  6 months.  - amLODipine (NORVASC) 10 MG tablet; Take 1 tablet (10 mg total) by mouth daily.  Dispense: 90 tablet; Refill: 1 - isosorbide mononitrate (IMDUR) 30 MG 24 hr tablet; TAKE 1/2 OF A TABLET (15 MG TOTAL) BY MOUTH DAILY  Dispense: 45 tablet; Refill: 3 - lisinopril (ZESTRIL) 40 MG tablet; Take 1 tablet (40 mg total) by mouth daily.  Dispense: 90 tablet; Refill: 3  2. Atherosclerotic heart disease of native coronary artery with other forms of angina pectoris (HCC)/Peripheral vascular disease (HCC): Following with vascular surgery, note from 02/26/2022 reviewed.  Plan to recheck fasting lipid panel at follow-up.  - clopidogrel (PLAVIX) 75 MG tablet; Take 1 tablet (75 mg total) by mouth daily.  Dispense: 90 tablet; Refill: 1  3. Anxiety: Controlled.  Continue Celexa 20 mg daily and BuSpar 10 mg as needed.  Celexa refilled.  - citalopram (CELEXA) 20 MG tablet; TAKE 1 TABLET BY MOUTH EVERYDAY AT BEDTIME  Dispense: 90 tablet; Refill: 1  5. Gastroesophageal reflux disease, unspecified whether esophagitis present: Controlled.  Patient states  she is doing well with the Protonix 40 mg but does not want to try to decrease the dose.  Continue 40 mg for now.  Follow-up: No follow-ups on file.    Margarita Mail, DO

## 2023-02-14 ENCOUNTER — Encounter: Payer: Self-pay | Admitting: Internal Medicine

## 2023-02-14 ENCOUNTER — Ambulatory Visit (INDEPENDENT_AMBULATORY_CARE_PROVIDER_SITE_OTHER): Payer: Medicare Other | Admitting: Internal Medicine

## 2023-02-14 VITALS — BP 136/86 | HR 62 | Temp 98.6°F | Resp 18 | Ht 61.0 in | Wt 153.4 lb

## 2023-02-14 DIAGNOSIS — I739 Peripheral vascular disease, unspecified: Secondary | ICD-10-CM | POA: Diagnosis not present

## 2023-02-14 DIAGNOSIS — K219 Gastro-esophageal reflux disease without esophagitis: Secondary | ICD-10-CM | POA: Diagnosis not present

## 2023-02-14 DIAGNOSIS — E559 Vitamin D deficiency, unspecified: Secondary | ICD-10-CM | POA: Diagnosis not present

## 2023-02-14 DIAGNOSIS — F419 Anxiety disorder, unspecified: Secondary | ICD-10-CM

## 2023-02-14 DIAGNOSIS — I25118 Atherosclerotic heart disease of native coronary artery with other forms of angina pectoris: Secondary | ICD-10-CM | POA: Diagnosis not present

## 2023-02-14 DIAGNOSIS — I10 Essential (primary) hypertension: Secondary | ICD-10-CM

## 2023-02-14 DIAGNOSIS — E785 Hyperlipidemia, unspecified: Secondary | ICD-10-CM | POA: Diagnosis not present

## 2023-02-14 MED ORDER — METOPROLOL TARTRATE 100 MG PO TABS
100.0000 mg | ORAL_TABLET | Freq: Three times a day (TID) | ORAL | 2 refills | Status: DC
Start: 2023-02-14 — End: 2023-08-15

## 2023-02-14 MED ORDER — AMLODIPINE BESYLATE 10 MG PO TABS: 10.0000 mg | ORAL_TABLET | Freq: Every day | ORAL | 1 refills | Status: DC

## 2023-02-14 MED ORDER — BUSPIRONE HCL 5 MG PO TABS
5.0000 mg | ORAL_TABLET | Freq: Every evening | ORAL | 1 refills | Status: DC | PRN
Start: 2023-02-14 — End: 2023-08-15

## 2023-02-14 MED ORDER — CLOPIDOGREL BISULFATE 75 MG PO TABS: 75.0000 mg | ORAL_TABLET | Freq: Every day | ORAL | 1 refills | Status: DC

## 2023-02-14 MED ORDER — ROSUVASTATIN CALCIUM 40 MG PO TABS: 40.0000 mg | ORAL_TABLET | Freq: Every day | ORAL | 3 refills | Status: DC

## 2023-02-14 MED ORDER — CITALOPRAM HYDROBROMIDE 20 MG PO TABS
ORAL_TABLET | ORAL | 1 refills | Status: DC
Start: 2023-02-14 — End: 2023-08-15

## 2023-02-14 MED ORDER — OMEPRAZOLE 40 MG PO CPDR
40.0000 mg | DELAYED_RELEASE_CAPSULE | Freq: Every day | ORAL | 1 refills | Status: DC
Start: 2023-02-14 — End: 2023-08-15

## 2023-02-15 LAB — CBC WITH DIFFERENTIAL/PLATELET
Absolute Monocytes: 578 cells/uL (ref 200–950)
Basophils Absolute: 33 cells/uL (ref 0–200)
Basophils Relative: 0.6 %
Eosinophils Absolute: 121 cells/uL (ref 15–500)
Eosinophils Relative: 2.2 %
HCT: 42.1 % (ref 35.0–45.0)
Hemoglobin: 14.1 g/dL (ref 11.7–15.5)
Lymphs Abs: 1804 cells/uL (ref 850–3900)
MCH: 34 pg — ABNORMAL HIGH (ref 27.0–33.0)
MCHC: 33.5 g/dL (ref 32.0–36.0)
MCV: 101.4 fL — ABNORMAL HIGH (ref 80.0–100.0)
MPV: 11.6 fL (ref 7.5–12.5)
Monocytes Relative: 10.5 %
Neutro Abs: 2965 cells/uL (ref 1500–7800)
Neutrophils Relative %: 53.9 %
Platelets: 202 10*3/uL (ref 140–400)
RBC: 4.15 10*6/uL (ref 3.80–5.10)
RDW: 12.7 % (ref 11.0–15.0)
Total Lymphocyte: 32.8 %
WBC: 5.5 10*3/uL (ref 3.8–10.8)

## 2023-02-15 LAB — COMPLETE METABOLIC PANEL WITH GFR
AG Ratio: 1.3 (calc) (ref 1.0–2.5)
ALT: 13 U/L (ref 6–29)
AST: 21 U/L (ref 10–35)
Albumin: 4.2 g/dL (ref 3.6–5.1)
Alkaline phosphatase (APISO): 82 U/L (ref 37–153)
BUN/Creatinine Ratio: 20 (calc) (ref 6–22)
BUN: 26 mg/dL — ABNORMAL HIGH (ref 7–25)
CO2: 31 mmol/L (ref 20–32)
Calcium: 9.8 mg/dL (ref 8.6–10.4)
Chloride: 105 mmol/L (ref 98–110)
Creat: 1.32 mg/dL — ABNORMAL HIGH (ref 0.60–0.95)
Globulin: 3.3 g/dL (calc) (ref 1.9–3.7)
Glucose, Bld: 88 mg/dL (ref 65–99)
Potassium: 4.9 mmol/L (ref 3.5–5.3)
Sodium: 140 mmol/L (ref 135–146)
Total Bilirubin: 0.4 mg/dL (ref 0.2–1.2)
Total Protein: 7.5 g/dL (ref 6.1–8.1)
eGFR: 41 mL/min/{1.73_m2} — ABNORMAL LOW (ref >=60)

## 2023-02-15 LAB — LIPID PANEL
Cholesterol: 142 mg/dL (ref <200)
HDL: 44 mg/dL — ABNORMAL LOW (ref >=50)
LDL Cholesterol (Calc): 71 mg/dL (calc)
Non-HDL Cholesterol (Calc): 98 mg/dL (calc) (ref <130)
Total CHOL/HDL Ratio: 3.2 (calc) (ref <5.0)
Triglycerides: 196 mg/dL — ABNORMAL HIGH (ref <150)

## 2023-02-15 LAB — VITAMIN D 25 HYDROXY (VIT D DEFICIENCY, FRACTURES): Vit D, 25-Hydroxy: 61 ng/mL (ref 30–100)

## 2023-02-20 ENCOUNTER — Other Ambulatory Visit: Payer: Self-pay | Admitting: Internal Medicine

## 2023-02-20 DIAGNOSIS — K219 Gastro-esophageal reflux disease without esophagitis: Secondary | ICD-10-CM

## 2023-02-20 NOTE — Telephone Encounter (Signed)
Unable to refill per protocol, Rx expired. Discontinued 02/14/23, change in therapy.  Requested Prescriptions  Pending Prescriptions Disp Refills   pantoprazole (PROTONIX) 40 MG tablet [Pharmacy Med Name: PANTOPRAZOLE SOD DR 40 MG TAB] 90 tablet 0    Sig: TAKE 1 TABLET BY MOUTH EVERY DAY     Gastroenterology: Proton Pump Inhibitors Passed - 02/20/2023  2:18 AM      Passed - Valid encounter within last 12 months    Recent Outpatient Visits           6 days ago Essential (primary) hypertension   [facility] Margarita Mail, DO   6 months ago Essential (primary) hypertension   [facility] Margarita Mail, DO   9 months ago Edema of left foot   [facility] Danelle Berry, PA-C   1 year ago Essential hypertension   [facility] Margarita Mail, DO   1 year ago Essential hypertension   [facility] Caro Laroche, DO       Future Appointments             In 5 months Margarita Mail, DO [facility], [facility]

## 2023-02-21 ENCOUNTER — Other Ambulatory Visit: Payer: Self-pay | Admitting: Internal Medicine

## 2023-02-21 DIAGNOSIS — F419 Anxiety disorder, unspecified: Secondary | ICD-10-CM

## 2023-02-21 NOTE — Telephone Encounter (Signed)
Unable to refill per protocol, Rx request is too soon. Last refill 02/14/23.E-Prescribing Status: Receipt confirmed by pharmacy (02/14/2023 11:02 AM EDT).  Requested Prescriptions  Pending Prescriptions Disp Refills   citalopram (CELEXA) 20 MG tablet [Pharmacy Med Name: CITALOPRAM HBR 20 MG TABLET] 90 tablet 1    Sig: TAKE 1 TABLET BY MOUTH EVERYDAY AT BEDTIME     Psychiatry:  Antidepressants - SSRI Passed - 02/21/2023  2:37 AM      Passed - Valid encounter within last 6 months    Recent Outpatient Visits           1 week ago Essential (primary) hypertension   [facility] Margarita Mail, DO   6 months ago Essential (primary) hypertension   [facility] Margarita Mail, DO   9 months ago Edema of left foot   [facility] Danelle Berry, PA-C   1 year ago Essential hypertension   [facility] Margarita Mail, DO   1 year ago Essential hypertension   [facility] Caro Laroche, DO       Future Appointments             In 5 months Margarita Mail, DO [facility], [facility]

## 2023-03-07 ENCOUNTER — Ambulatory Visit (INDEPENDENT_AMBULATORY_CARE_PROVIDER_SITE_OTHER): Payer: Medicare Other

## 2023-03-07 VITALS — Ht 61.0 in | Wt 153.0 lb

## 2023-03-07 DIAGNOSIS — Z0000 Encounter for general adult medical examination without abnormal findings: Secondary | ICD-10-CM

## 2023-03-07 NOTE — Patient Instructions (Signed)
[name] , Thank you for taking time to come for your Medicare Wellness Visit. I appreciate your ongoing commitment to your health goals. Please review the following plan we discussed and let me know if I can assist you in the future.   Referrals/Orders/Follow-Ups/Clinician Recommendations: none  This is a list of the screening recommended for you and due dates:  Health Maintenance  Topic Date Due   COVID-19 Vaccine (7 - 2023-24 season) 04/06/2022   Flu Shot  03/07/2023   Zoster (Shingles) Vaccine (1 of 2) 05/17/2023*   DEXA scan (bone density measurement)  02/14/2024*   Medicare Annual Wellness Visit  03/06/2024   Pneumonia Vaccine  Completed   HPV Vaccine  Aged Out   DTaP/Tdap/Td vaccine  Discontinued   Hepatitis C Screening  Discontinued  *Topic was postponed. The date shown is not the original due date.    Advanced directives: (Copy Requested) Please bring a copy of your health care power of attorney and living will to the office to be added to your chart at your convenience.  Next Medicare Annual Wellness Visit scheduled for next year: Yes 03/12/24 @ 10:45am telephone  Preventive Care 65 Years and Older, Female Preventive care refers to lifestyle choices and visits with your health care provider that can promote health and wellness. What does preventive care include? A yearly physical exam. This is also called an annual well check. Dental exams once or twice a year. Routine eye exams. Ask your health care provider how often you should have your eyes checked. Personal lifestyle choices, including: Daily care of your teeth and gums. Regular physical activity. Eating a healthy diet. Avoiding tobacco and drug use. Limiting alcohol use. Practicing safe sex. Taking low-dose aspirin every day. Taking vitamin and mineral supplements as recommended by your health care provider. What happens during an annual well check? The services and screenings done by your health care provider  during your annual well check will depend on your age, overall health, lifestyle risk factors, and family history of disease. Counseling  Your health care provider may ask you questions about your: Alcohol use. Tobacco use. Drug use. Emotional well-being. Home and relationship well-being. Sexual activity. Eating habits. History of falls. Memory and ability to understand (cognition). Work and work Astronomer. Reproductive health. Screening  You may have the following tests or measurements: Height, weight, and BMI. Blood pressure. Lipid and cholesterol levels. These may be checked every 5 years, or more frequently if you are over 57 years old. Skin check. Lung cancer screening. You may have this screening every year starting at age 60 if you have a 30-pack-year history of smoking and currently smoke or have quit within the past 15 years. Fecal occult blood test (FOBT) of the stool. You may have this test every year starting at age 39. Flexible sigmoidoscopy or colonoscopy. You may have a sigmoidoscopy every 5 years or a colonoscopy every 10 years starting at age 23. Hepatitis C blood test. Hepatitis B blood test. Sexually transmitted disease (STD) testing. Diabetes screening. This is done by checking your blood sugar (glucose) after you have not eaten for a while (fasting). You may have this done every 1-3 years. Bone density scan. This is done to screen for osteoporosis. You may have this done starting at age 46. Mammogram. This may be done every 1-2 years. Talk to your health care provider about how often you should have regular mammograms. Talk with your health care provider about your test results, treatment options, and if  necessary, the need for more tests. Vaccines  Your health care provider may recommend certain vaccines, such as: Influenza vaccine. This is recommended every year. Tetanus, diphtheria, and acellular pertussis (Tdap, Td) vaccine. You may need a Td booster every  10 years. Zoster vaccine. You may need this after age 23. Pneumococcal 13-valent conjugate (PCV13) vaccine. One dose is recommended after age 68. Pneumococcal polysaccharide (PPSV23) vaccine. One dose is recommended after age 38. Talk to your health care provider about which screenings and vaccines you need and how often you need them. This information is not intended to replace advice given to you by your health care provider. Make sure you discuss any questions you have with your health care provider. Document Released: 08/19/2015 Document Revised: 04/11/2016 Document Reviewed: 05/24/2015 Elsevier Interactive Patient Education  2017 [employer].  Fall Prevention in the Home Falls can cause injuries. They can happen to people of all ages. There are many things you can do to make your home safe and to help prevent falls. What can I do on the outside of my home? Regularly fix the edges of walkways and driveways and fix any cracks. Remove anything that might make you trip as you walk through a door, such as a raised step or threshold. Trim any bushes or trees on the path to your home. Use bright outdoor lighting. Clear any walking paths of anything that might make someone trip, such as rocks or tools. Regularly check to see if handrails are loose or broken. Make sure that both sides of any steps have handrails. Any raised decks and porches should have guardrails on the edges. Have any leaves, snow, or ice cleared regularly. Use sand or salt on walking paths during winter. Clean up any spills in your garage right away. This includes oil or grease spills. What can I do in the bathroom? Use night lights. Install grab bars by the toilet and in the tub and shower. Do not use towel bars as grab bars. Use non-skid mats or decals in the tub or shower. If you need to sit down in the shower, use a plastic, non-slip stool. Keep the floor dry. Clean up any water that spills on the floor as soon as it  happens. Remove soap buildup in the tub or shower regularly. Attach bath mats securely with double-sided non-slip rug tape. Do not have throw rugs and other things on the floor that can make you trip. What can I do in the bedroom? Use night lights. Make sure that you have a light by your bed that is easy to reach. Do not use any sheets or blankets that are too big for your bed. They should not hang down onto the floor. Have a firm chair that has side arms. You can use this for support while you get dressed. Do not have throw rugs and other things on the floor that can make you trip. What can I do in the kitchen? Clean up any spills right away. Avoid walking on wet floors. Keep items that you use a lot in easy-to-reach places. If you need to reach something above you, use a strong step stool that has a grab bar. Keep electrical cords out of the way. Do not use floor polish or wax that makes floors slippery. If you must use wax, use non-skid floor wax. Do not have throw rugs and other things on the floor that can make you trip. What can I do with my stairs? Do not leave any items  on the stairs. Make sure that there are handrails on both sides of the stairs and use them. Fix handrails that are broken or loose. Make sure that handrails are as long as the stairways. Check any carpeting to make sure that it is firmly attached to the stairs. Fix any carpet that is loose or worn. Avoid having throw rugs at the top or bottom of the stairs. If you do have throw rugs, attach them to the floor with carpet tape. Make sure that you have a light switch at the top of the stairs and the bottom of the stairs. If you do not have them, ask someone to add them for you. What else can I do to help prevent falls? Wear shoes that: Do not have high heels. Have rubber bottoms. Are comfortable and fit you well. Are closed at the toe. Do not wear sandals. If you use a stepladder: Make sure that it is fully opened.  Do not climb a closed stepladder. Make sure that both sides of the stepladder are locked into place. Ask someone to hold it for you, if possible. Clearly mark and make sure that you can see: Any grab bars or handrails. First and last steps. Where the edge of each step is. Use tools that help you move around (mobility aids) if they are needed. These include: Canes. Walkers. Scooters. Crutches. Turn on the lights when you go into a dark area. Replace any light bulbs as soon as they burn out. Set up your furniture so you have a clear path. Avoid moving your furniture around. If any of your floors are uneven, fix them. If there are any pets around you, be aware of where they are. Review your medicines with your doctor. Some medicines can make you feel dizzy. This can increase your chance of falling. Ask your doctor what other things that you can do to help prevent falls. This information is not intended to replace advice given to you by your health care provider. Make sure you discuss any questions you have with your health care provider. Document Released: 05/19/2009 Document Revised: 12/29/2015 Document Reviewed: 08/27/2014 Elsevier Interactive Patient Education  2017 [employer].
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
## 2023-03-11 ENCOUNTER — Other Ambulatory Visit: Payer: Self-pay | Admitting: Internal Medicine

## 2023-03-12 NOTE — Telephone Encounter (Signed)
Requested Prescriptions  Pending Prescriptions Disp Refills   potassium citrate (UROCIT-K) 10 MEQ (1080 MG) SR tablet [Pharmacy Med Name: POTASSIUM CIT 1080MG  ( ) TABS] 180 tablet 0    Sig: TAKE 1 TABLET BY MOUTH TWICE DAILY     Endocrinology:  Minerals - Potassium Citrate Failed - 03/11/2023  3:41 AM      Failed - Cr in normal range and within 120 days    Creat  Date Value Ref Range Status  02/14/2023 1.32 (H) 0.60 - 0.95 mg/dL Final         Failed - Urinalysis completed in last 4 months.    Specific Gravity, Urine  Date Value Ref Range Status  05/17/2018 1.017 1.005 - 1.030 Final   Glucose, UA  Date Value Ref Range Status  05/17/2018 NEGATIVE NEGATIVE mg/dL Final   Urobilinogen, UA  Date Value Ref Range Status  01/01/2011 0.2 0.0 - 1.0 mg/dL Final   Protein, ur  Date Value Ref Range Status  05/17/2018 30 (A) NEGATIVE mg/dL Final   Total Protein  Date Value Ref Range Status  02/14/2023 7.5 6.1 - 8.1 g/dL Final   Nitrite  Date Value Ref Range Status  05/17/2018 NEGATIVE NEGATIVE Final   RBC / HPF  Date Value Ref Range Status  05/17/2018 0-5 0 - 5 RBC/hpf Final   WBC, UA  Date Value Ref Range Status  05/17/2018 0-5 0 - 5 WBC/hpf Final         Passed - CO2 in normal range and within 120 days    CO2  Date Value Ref Range Status  02/14/2023 31 20 - 32 mmol/L Final   Co2  Date Value Ref Range Status  10/02/2012 24 21 - 32 mmol/L Final   Bicarbonate  Date Value Ref Range Status  05/21/2018 26.0 20.0 - 28.0 mmol/L Final         Passed - Cl in normal range and within 120 days    Chloride  Date Value Ref Range Status  02/14/2023 105 98 - 110 mmol/L Final  10/02/2012 105 98 - 107 mmol/L Final         Passed - K in normal range and within 120 days    Potassium  Date Value Ref Range Status  02/14/2023 4.9 3.5 - 5.3 mmol/L Final  10/02/2012 3.5 3.5 - 5.1 mmol/L Final         Passed - Na in normal range and within 120 days    Sodium  Date Value Ref  Range Status  02/14/2023 140 135 - 146 mmol/L Final  05/29/2021 144 134 - 144 mmol/L Final  10/02/2012 137 136 - 145 mmol/L Final         Passed - WBC in normal range and within 120 days    WBC  Date Value Ref Range Status  02/14/2023 5.5 3.8 - 10.8 Thousand/uL Final         Passed - HGB in normal range and within 120 days    Hemoglobin  Date Value Ref Range Status  02/14/2023 14.1 11.7 - 15.5 g/dL Final   HGB  Date Value Ref Range Status  10/02/2012 15.3 12.0 - 16.0 g/dL Final         Passed - HCT in normal range and within 120 days    HCT  Date Value Ref Range Status  02/14/2023 42.1 35.0 - 45.0 % Final  10/02/2012 44.6 35.0 - 47.0 % Final         Passed - PLT  in normal range and within 120 days    Platelets  Date Value Ref Range Status  02/14/2023 202 140 - 400 Thousand/uL Final   Platelet  Date Value Ref Range Status  10/02/2012 262 150 - 440 x10 3/mm 3 Final         Passed - Valid encounter within last 4 months    Recent Outpatient Visits           3 weeks ago Essential (primary) hypertension   [facility] Margarita Mail, DO   6 months ago Essential (primary) hypertension   [facility] Margarita Mail, DO   10 months ago Edema of left foot   [facility] Danelle Berry, PA-C   1 year ago Essential hypertension   [facility] Margarita Mail, DO   1 year ago Essential hypertension   [facility] Caro Laroche, DO       Future Appointments             In 5 months Margarita Mail, DO [facility]

## 2023-03-19 ENCOUNTER — Telehealth: Payer: Self-pay | Admitting: *Deleted

## 2023-03-19 ENCOUNTER — Ambulatory Visit
Payer: Medicare Other | Attending: Student in an Organized Health Care Education/Training Program | Admitting: Student in an Organized Health Care Education/Training Program

## 2023-03-19 ENCOUNTER — Encounter: Payer: Self-pay | Admitting: *Deleted

## 2023-03-19 ENCOUNTER — Encounter: Payer: Self-pay | Admitting: Student in an Organized Health Care Education/Training Program

## 2023-03-19 VITALS — BP 151/59 | HR 58 | Temp 97.0°F | Resp 17 | Ht 61.0 in | Wt 150.0 lb

## 2023-03-19 DIAGNOSIS — I739 Peripheral vascular disease, unspecified: Secondary | ICD-10-CM | POA: Diagnosis not present

## 2023-03-19 DIAGNOSIS — G8921 Chronic pain due to trauma: Secondary | ICD-10-CM | POA: Diagnosis not present

## 2023-03-19 DIAGNOSIS — Z9889 Other specified postprocedural states: Secondary | ICD-10-CM | POA: Diagnosis not present

## 2023-03-19 DIAGNOSIS — M1652 Unilateral post-traumatic osteoarthritis, left hip: Secondary | ICD-10-CM | POA: Diagnosis not present

## 2023-03-19 MED ORDER — HYDROCODONE-ACETAMINOPHEN 10-325 MG PO TABS: 1.0000 | ORAL_TABLET | ORAL | 0 refills | Status: DC | PRN

## 2023-03-19 NOTE — Patient Instructions (Signed)
 Visit Information  Thank you for taking time to visit with me today. Please don't hesitate to contact me if I can be of assistance to you.   Please call the Suicide and Crisis Lifeline: 988 call the [place] National Suicide Prevention Lifeline: 8130983144 or TTY: 470-380-9009 TTY [phone]) to talk to a trained counselor call 1-800-273-TALK (toll free, 24 hour hotline) call 911 if you are experiencing a Mental Health or Behavioral Health Crisis or need someone to talk to.  Patient verbalizes understanding of instructions and care plan provided today and agrees to view in MyChart. Active MyChart status and patient understanding of how to access instructions and care plan via MyChart confirmed with patient.     The patient has been provided with contact information for the care management team and has been advised to call with any health related questions or concerns.   Kemper Durie [facility] Care Management Care Management Coordinator [phone]
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
## 2023-03-19 NOTE — Progress Notes (Signed)
Nursing Pain Medication Assessment:  Safety precautions to be maintained throughout the outpatient stay will include: orient to surroundings, keep bed in low position, maintain call bell within reach at all times, provide assistance with transfer out of bed and ambulation.  Medication Inspection Compliance: Pill count conducted under aseptic conditions, in front of the patient. Neither the pills nor the bottle was removed from the patient's sight at any time. Once count was completed pills were immediately returned to the patient in their original bottle.  Medication: Hydrocodone/APAP Pill/Patch Count:  45 of 180 pills remain Pill/Patch Appearance: Markings consistent with prescribed medication Bottle Appearance: Standard pharmacy container. Clearly labeled. Filled Date: 7 / 68 / 2024 Last Medication intake:  Today

## 2023-03-19 NOTE — Patient Outreach (Signed)
  Care Coordination   Initial Visit Note   03/19/2023 Name: Elizabeth [name] MRN: [id number] DOB: Jul 05, 1941  Elizabeth [name] is a 82 y.o. year old female who sees Margarita Mail, DO for primary care. I spoke with  Lubertha Basque by phone today.  What matters to the patients health and wellness today?  Continue current stable health management.  Does not feel follow up is needed, will call with questions/concerns.     Goals Addressed             This Visit's Progress    COMPLETED: Care Coordination Activities - No follow up needed       Interventions Today    Flowsheet Row Most Recent Value  Chronic Disease   Chronic disease during today's visit Hypertension (HTN), Chronic Kidney Disease/End Stage Renal Disease (ESRD)  [CAD]  General Interventions   General Interventions Discussed/Reviewed General Interventions Reviewed, Doctor Visits, Vaccines, Durable Medical Equipment (DME)  Vaccines Flu, Pneumonia  Doctor Visits Discussed/Reviewed Doctor Visits Reviewed, Annual Wellness Visits, PCP, Specialist  [10/22 with vascular, Pain clinic on 11/7, PCP 08/15/2023]  Durable Medical Equipment (DME) BP Cuff  PCP/Specialist Visits Compliance with follow-up visit  [AWV done on 8/1, last PCP visit done on 7/11]  Education Interventions   Education Provided Provided Education  Provided Verbal Education On Medication, When to see the doctor  Pharmacy Interventions   Pharmacy Dicussed/Reviewed Pharmacy Topics Reviewed, Affording Medications              SDOH assessments and interventions completed:  Yes  SDOH Interventions Today    Flowsheet Row Most Recent Value  SDOH Interventions   Food Insecurity Interventions Intervention Not Indicated  Housing Interventions Intervention Not Indicated  Transportation Interventions Intervention Not Indicated        Care Coordination Interventions:  Yes, provided   Follow up plan: No further intervention required.   Encounter  Outcome:  Pt. Visit Completed   Kemper Durie, RN, MSN, [facility] Care Management Care Management Coordinator [phone]

## 2023-04-04 ENCOUNTER — Other Ambulatory Visit: Payer: Self-pay | Admitting: Internal Medicine

## 2023-04-04 DIAGNOSIS — F419 Anxiety disorder, unspecified: Secondary | ICD-10-CM

## 2023-04-04 NOTE — Telephone Encounter (Signed)
Medication Refill - Medication: busPIRone (BUSPAR) 5 MG tablet   Has the patient contacted their pharmacy? No.  Preferred Pharmacy (with phone number or street name):  [id number] [place], [place] - [address] AT [facility] [address] Phone: [phone]  Fax: [phone]     Has the patient been seen for an appointment in the last year OR does the patient have an upcoming appointment? Yes.    Agent: Please be advised that RX refills may take up to 3 business days. We ask that you follow-up with your pharmacy.  Patient stated she is out of medication as she has been taking 4 tablets a day. Please f/u with patient

## 2023-04-05 NOTE — Telephone Encounter (Signed)
Requested medications are due for refill today.  Should not be - see pt note below  Requested medications are on the active medications list.  yes  Last refill. 02/14/2023 #90 1 rf  Future visit scheduled.   yes  Notes to clinic.  Patient stated she is out of medication as she has been taking 4 tablets a day. Please f/u with patient    Requested Prescriptions  Pending Prescriptions Disp Refills   busPIRone (BUSPAR) 5 MG tablet 90 tablet 1    Sig: Take 1 tablet (5 mg total) by mouth at bedtime as needed (anxiety/nerves/panic).     Psychiatry: Anxiolytics/Hypnotics - Non-controlled Passed - 04/04/2023 11:00 AM      Passed - Valid encounter within last 12 months    Recent Outpatient Visits           1 month ago Essential (primary) hypertension   [facility] Margarita Mail, DO   7 months ago Essential (primary) hypertension   [facility] Margarita Mail, DO   11 months ago Edema of left foot   [facility] Danelle Berry, PA-C   1 year ago Essential hypertension   [facility] Margarita Mail, DO   1 year ago Essential hypertension   [facility] Caro Laroche, DO       Future Appointments             In 4 months Margarita Mail, DO [facility], [facility]

## 2023-04-11 ENCOUNTER — Other Ambulatory Visit: Payer: Self-pay

## 2023-04-11 ENCOUNTER — Other Ambulatory Visit: Payer: Self-pay | Admitting: Internal Medicine

## 2023-04-11 DIAGNOSIS — I739 Peripheral vascular disease, unspecified: Secondary | ICD-10-CM

## 2023-04-11 MED ORDER — NITROGLYCERIN 0.4 MG SL SUBL: 0.4000 mg | SUBLINGUAL_TABLET | SUBLINGUAL | 0 refills | Status: AC | PRN

## 2023-04-12 NOTE — Telephone Encounter (Signed)
Requested Prescriptions  Pending Prescriptions Disp Refills   clopidogrel (PLAVIX) 75 MG tablet [Pharmacy Med Name: CLOPIDOGREL 75MG  TABLETS] 90 tablet 1    Sig: TAKE 1 TABLET BY MOUTH EVERY DAY     Hematology: Antiplatelets - clopidogrel Failed - 04/11/2023  9:27 AM      Failed - Cr in normal range and within 360 days    Creat  Date Value Ref Range Status  02/14/2023 1.32 (H) 0.60 - 0.95 mg/dL Final         Passed - HCT in normal range and within 180 days    HCT  Date Value Ref Range Status  02/14/2023 42.1 35.0 - 45.0 % Final  10/02/2012 44.6 35.0 - 47.0 % Final         Passed - HGB in normal range and within 180 days    Hemoglobin  Date Value Ref Range Status  02/14/2023 14.1 11.7 - 15.5 g/dL Final   HGB  Date Value Ref Range Status  10/02/2012 15.3 12.0 - 16.0 g/dL Final         Passed - PLT in normal range and within 180 days    Platelets  Date Value Ref Range Status  02/14/2023 202 140 - 400 Thousand/uL Final   Platelet  Date Value Ref Range Status  10/02/2012 262 150 - 440 x10 3/mm 3 Final         Passed - Valid encounter within last 6 months    Recent Outpatient Visits           1 month ago Essential (primary) hypertension   [facility] Margarita Mail, DO   7 months ago Essential (primary) hypertension   [facility] Margarita Mail, DO   11 months ago Edema of left foot   [facility] Danelle Berry, PA-C   1 year ago Essential hypertension   [facility] Margarita Mail, DO   1 year ago Essential hypertension   [facility] Caro Laroche, DO       Future Appointments             In 4 months Margarita Mail, DO [facility], [facility]

## 2023-05-28 ENCOUNTER — Encounter (INDEPENDENT_AMBULATORY_CARE_PROVIDER_SITE_OTHER): Payer: Medicare Other

## 2023-05-28 ENCOUNTER — Ambulatory Visit (INDEPENDENT_AMBULATORY_CARE_PROVIDER_SITE_OTHER): Payer: Medicare Other | Admitting: Vascular Surgery

## 2023-05-28 ENCOUNTER — Other Ambulatory Visit (INDEPENDENT_AMBULATORY_CARE_PROVIDER_SITE_OTHER): Payer: Medicare Other

## 2023-06-10 ENCOUNTER — Other Ambulatory Visit: Payer: Self-pay | Admitting: Internal Medicine

## 2023-06-11 ENCOUNTER — Other Ambulatory Visit: Payer: Self-pay

## 2023-06-11 NOTE — Telephone Encounter (Signed)
Requested medication (s) are due for refill today: Yes  Requested medication (s) are on the active medication list: Yes  Last refill:  03/12/23  Future visit scheduled: Yes  Notes to clinic:  Unable to refill per protocol due to failed labs, no updated results.      Requested Prescriptions  Pending Prescriptions Disp Refills   potassium citrate (UROCIT-K) 10 MEQ (1080 MG) SR tablet [Pharmacy Med Name: POTASSIUM CIT 1080MG  ( ) TABS] 180 tablet 0    Sig: TAKE 1 TABLET BY MOUTH TWICE DAILY     Endocrinology:  Minerals - Potassium Citrate Failed - 06/10/2023 12:38 PM      Failed - Cr in normal range and within 120 days    Creat  Date Value Ref Range Status  02/14/2023 1.32 (H) 0.60 - 0.95 mg/dL Final         Failed - Urinalysis completed in last 4 months.    Specific Gravity, Urine  Date Value Ref Range Status  05/17/2018 1.017 1.005 - 1.030 Final   Glucose, UA  Date Value Ref Range Status  05/17/2018 NEGATIVE NEGATIVE mg/dL Final   Urobilinogen, UA  Date Value Ref Range Status  01/01/2011 0.2 0.0 - 1.0 mg/dL Final   Protein, ur  Date Value Ref Range Status  05/17/2018 30 (A) NEGATIVE mg/dL Final   Total Protein  Date Value Ref Range Status  02/14/2023 7.5 6.1 - 8.1 g/dL Final   Nitrite  Date Value Ref Range Status  05/17/2018 NEGATIVE NEGATIVE Final   RBC / HPF  Date Value Ref Range Status  05/17/2018 0-5 0 - 5 RBC/hpf Final   WBC, UA  Date Value Ref Range Status  05/17/2018 0-5 0 - 5 WBC/hpf Final         Passed - CO2 in normal range and within 120 days    CO2  Date Value Ref Range Status  02/14/2023 31 20 - 32 mmol/L Final   Co2  Date Value Ref Range Status  10/02/2012 24 21 - 32 mmol/L Final   Bicarbonate  Date Value Ref Range Status  05/21/2018 26.0 20.0 - 28.0 mmol/L Final         Passed - Cl in normal range and within 120 days    Chloride  Date Value Ref Range Status  02/14/2023 105 98 - 110 mmol/L Final  10/02/2012 105 98 - 107 mmol/L  Final         Passed - K in normal range and within 120 days    Potassium  Date Value Ref Range Status  02/14/2023 4.9 3.5 - 5.3 mmol/L Final  10/02/2012 3.5 3.5 - 5.1 mmol/L Final         Passed - Na in normal range and within 120 days    Sodium  Date Value Ref Range Status  02/14/2023 140 135 - 146 mmol/L Final  05/29/2021 144 134 - 144 mmol/L Final  10/02/2012 137 136 - 145 mmol/L Final         Passed - WBC in normal range and within 120 days    WBC  Date Value Ref Range Status  02/14/2023 5.5 3.8 - 10.8 Thousand/uL Final         Passed - HGB in normal range and within 120 days    Hemoglobin  Date Value Ref Range Status  02/14/2023 14.1 11.7 - 15.5 g/dL Final   HGB  Date Value Ref Range Status  10/02/2012 15.3 12.0 - 16.0 g/dL Final  Passed - HCT in normal range and within 120 days    HCT  Date Value Ref Range Status  02/14/2023 42.1 35.0 - 45.0 % Final  10/02/2012 44.6 35.0 - 47.0 % Final         Passed - PLT in normal range and within 120 days    Platelets  Date Value Ref Range Status  02/14/2023 202 140 - 400 Thousand/uL Final   Platelet  Date Value Ref Range Status  10/02/2012 262 150 - 440 x10 3/mm 3 Final         Passed - Valid encounter within last 4 months    Recent Outpatient Visits           3 months ago Essential (primary) hypertension   [facility] Margarita Mail, DO   9 months ago Essential (primary) hypertension   [facility] Margarita Mail, DO   1 year ago Edema of left foot   [facility] Danelle Berry, PA-C   1 year ago Essential hypertension   [facility] Margarita Mail, DO   1 year ago Essential hypertension   [facility] Caro Laroche, DO       Future Appointments             In 2 months Margarita Mail, DO [facility], [facility]

## 2023-06-12 MED ORDER — POTASSIUM CITRATE ER 10 MEQ (1080 MG) PO TBCR: 10.0000 meq | EXTENDED_RELEASE_TABLET | Freq: Two times a day (BID) | ORAL | 0 refills | Status: DC

## 2023-06-13 ENCOUNTER — Ambulatory Visit
Payer: Medicare Other | Attending: Student in an Organized Health Care Education/Training Program | Admitting: Student in an Organized Health Care Education/Training Program

## 2023-06-13 ENCOUNTER — Encounter: Payer: Self-pay | Admitting: Student in an Organized Health Care Education/Training Program

## 2023-06-13 VITALS — BP 141/68 | HR 55 | Temp 99.5°F | Resp 18 | Ht 61.0 in | Wt 150.0 lb

## 2023-06-13 DIAGNOSIS — G8921 Chronic pain due to trauma: Secondary | ICD-10-CM | POA: Insufficient documentation

## 2023-06-13 DIAGNOSIS — I25118 Atherosclerotic heart disease of native coronary artery with other forms of angina pectoris: Secondary | ICD-10-CM | POA: Diagnosis not present

## 2023-06-13 DIAGNOSIS — M1652 Unilateral post-traumatic osteoarthritis, left hip: Secondary | ICD-10-CM | POA: Diagnosis not present

## 2023-06-13 DIAGNOSIS — I739 Peripheral vascular disease, unspecified: Secondary | ICD-10-CM | POA: Diagnosis not present

## 2023-06-13 DIAGNOSIS — Z9889 Other specified postprocedural states: Secondary | ICD-10-CM | POA: Insufficient documentation

## 2023-06-13 DIAGNOSIS — Z79891 Long term (current) use of opiate analgesic: Secondary | ICD-10-CM | POA: Diagnosis not present

## 2023-06-13 MED ORDER — HYDROCODONE-ACETAMINOPHEN 10-325 MG PO TABS: 1.0000 | ORAL_TABLET | ORAL | 0 refills | Status: DC | PRN

## 2023-06-13 NOTE — Patient Instructions (Addendum)
November 17th December 17th January 16th  Make sure your med refill appt is before 2-15
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
## 2023-06-13 NOTE — Progress Notes (Signed)
Nursing Pain Medication Assessment:  Safety precautions to be maintained throughout the outpatient stay will include: orient to surroundings, keep bed in low position, maintain call bell within reach at all times, provide assistance with transfer out of bed and ambulation.  Medication Inspection Compliance: [name] did not comply with our request to bring her pills to be counted. She was reminded that bringing the medication bottles, even when empty, is a requirement.  Medication: Hydrocodone/APAP Pill/Patch Count:  ? of ? pills remain Pill/Patch Appearance: No markings Bottle Appearance: No container available. Did not bring bottle(s) to appointment. Filled Date: 88 / 19 / 2024 Last Medication intake:  Today

## 2023-06-18 LAB — TOXASSURE SELECT 13 (MW), URINE

## 2023-08-13 ENCOUNTER — Other Ambulatory Visit: Payer: Self-pay | Admitting: Internal Medicine

## 2023-08-13 DIAGNOSIS — F419 Anxiety disorder, unspecified: Secondary | ICD-10-CM

## 2023-08-13 DIAGNOSIS — I739 Peripheral vascular disease, unspecified: Secondary | ICD-10-CM

## 2023-08-13 DIAGNOSIS — I10 Essential (primary) hypertension: Secondary | ICD-10-CM

## 2023-08-13 DIAGNOSIS — K219 Gastro-esophageal reflux disease without esophagitis: Secondary | ICD-10-CM

## 2023-08-15 ENCOUNTER — Encounter: Payer: Self-pay | Admitting: Internal Medicine

## 2023-08-15 ENCOUNTER — Ambulatory Visit (INDEPENDENT_AMBULATORY_CARE_PROVIDER_SITE_OTHER): Payer: Medicare Other | Admitting: Internal Medicine

## 2023-08-15 VITALS — BP 132/74 | HR 68 | Temp 97.8°F | Resp 14 | Ht 61.0 in | Wt 153.5 lb

## 2023-08-15 DIAGNOSIS — N1832 Chronic kidney disease, stage 3b: Secondary | ICD-10-CM | POA: Diagnosis not present

## 2023-08-15 DIAGNOSIS — I25118 Atherosclerotic heart disease of native coronary artery with other forms of angina pectoris: Secondary | ICD-10-CM

## 2023-08-15 DIAGNOSIS — K219 Gastro-esophageal reflux disease without esophagitis: Secondary | ICD-10-CM

## 2023-08-15 DIAGNOSIS — I10 Essential (primary) hypertension: Secondary | ICD-10-CM | POA: Diagnosis not present

## 2023-08-15 DIAGNOSIS — F419 Anxiety disorder, unspecified: Secondary | ICD-10-CM

## 2023-08-15 MED ORDER — OMEPRAZOLE 40 MG PO CPDR: 40.0000 mg | DELAYED_RELEASE_CAPSULE | Freq: Every day | ORAL | 1 refills | Status: DC

## 2023-08-15 MED ORDER — METOPROLOL TARTRATE 100 MG PO TABS: 100.0000 mg | ORAL_TABLET | Freq: Three times a day (TID) | ORAL | 2 refills | Status: DC

## 2023-08-15 MED ORDER — BUSPIRONE HCL 10 MG PO TABS: 10.0000 mg | ORAL_TABLET | Freq: Two times a day (BID) | ORAL | 1 refills | Status: DC

## 2023-08-15 MED ORDER — AMLODIPINE BESYLATE 10 MG PO TABS: 10.0000 mg | ORAL_TABLET | Freq: Every day | ORAL | 1 refills | Status: DC

## 2023-08-15 MED ORDER — POTASSIUM CITRATE ER 10 MEQ (1080 MG) PO TBCR: 10.0000 meq | EXTENDED_RELEASE_TABLET | Freq: Two times a day (BID) | ORAL | 0 refills | Status: DC

## 2023-08-15 MED ORDER — CITALOPRAM HYDROBROMIDE 20 MG PO TABS: ORAL_TABLET | ORAL | 1 refills | Status: DC

## 2023-08-15 MED ORDER — ISOSORBIDE MONONITRATE ER 30 MG PO TB24: ORAL_TABLET | ORAL | 2 refills | Status: DC

## 2023-08-15 MED ORDER — LISINOPRIL 40 MG PO TABS: 40.0000 mg | ORAL_TABLET | Freq: Every day | ORAL | 3 refills | Status: DC

## 2023-08-15 NOTE — Telephone Encounter (Signed)
 Requested medication (s) are due for refill today: yes   Requested medication (s) are on the active medication list: yes   Last refill:  04/12/23 #90 1 refills  Future visit scheduled: yes in 6 months  Notes to clinic:  protocol failed. Last labs 02/14/23. Last OV today . Do you want to refill Rx?     Requested Prescriptions  Pending Prescriptions Disp Refills   clopidogrel  (PLAVIX ) 75 MG tablet [Pharmacy Med Name: CLOPIDOGREL  75MG  TABLETS] 90 tablet 1    Sig: TAKE 1 TABLET(75 MG) BY MOUTH DAILY     Hematology: Antiplatelets - clopidogrel  Failed - 08/15/2023 11:56 AM      Failed - HCT in normal range and within 180 days    HCT  Date Value Ref Range Status  02/14/2023 42.1 35.0 - 45.0 % Final  10/02/2012 44.6 35.0 - 47.0 % Final         Failed - HGB in normal range and within 180 days    Hemoglobin  Date Value Ref Range Status  02/14/2023 14.1 11.7 - 15.5 g/dL Final   HGB  Date Value Ref Range Status  10/02/2012 15.3 12.0 - 16.0 g/dL Final         Failed - PLT in normal range and within 180 days    Platelets  Date Value Ref Range Status  02/14/2023 202 140 - 400 Thousand/uL Final   Platelet  Date Value Ref Range Status  10/02/2012 262 150 - 440 x10 3/mm 3 Final         Failed - Cr in normal range and within 360 days    Creat  Date Value Ref Range Status  02/14/2023 1.32 (H) 0.60 - 0.95 mg/dL Final         Passed - Valid encounter within last 6 months    Recent Outpatient Visits           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 [facility])   [place] [facility] Fend, DO   6 months ago Essential (primary) hypertension   [place] [facility] Fend, DO   12 months ago Essential (primary) hypertension   [place] [facility]end, DO   1 year ago Edema of left foot   [facility] Leavy Mole, PA-C   1 year ago Essential hypertension   [place]  [facility] Bernardo Fend, DO       Future Appointments             In 6 months Bernardo Fend, DO [facility]            Refused Prescriptions Disp Refills   omeprazole  (PRILOSEC) 40 MG capsule [Pharmacy Med Name: OMEPRAZOLE  40MG  CAPSULES] 90 capsule 1    Sig: TAKE 1 CAPSULE(40 MG) BY MOUTH DAILY     Gastroenterology: Proton Pump Inhibitors Passed - 08/15/2023 11:56 AM      Passed - Valid encounter within last 12 months    Recent Outpatient Visits           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 [facility])   [place] [facility] Bern[facility] Fend, DO   6 months ago Essential (primary) hypertension   [facility], DO   12 months ago Essential (primary) hypertension   [place] [facility] Bern[facility] Fend, DO   1 year ago Edema of left foot   [place] [facility] Leavy Mole, PA-C   1 year ago Essential hypertension   [place] [facility] Bernardo Fend, DO       Future Appointments             In 6 months Bernardo Fend, DO [facility]             amLODipine  (NORVASC ) 10 MG tablet [Pharmacy Med Name: AMLODIPINE  BESYLATE 10MG  TABLETS] 90 tablet 1    Sig: TAKE 1 TABLET(10 MG) BY MOUTH DAILY     Cardiovascular: Calcium  Channel Blockers 2 Passed - 08/15/2023 11:56 AM      Passed - Last BP in normal range    BP Readings from Last 1 Encounters:  08/15/23 132/74         Passed - Last Heart Rate in normal range    Pulse Readings from Last 1 Encounters:  08/15/23 68         Passed - Valid encounter within last 6 months    Recent Outpatient Visits           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 [facility])   [place] [facility], DO   6 months ago Essential (primary) hypertension   [place] [facility]  Bernardo Fend, DO   12 months ago Essential (primary) hypertension   [place] [facility]end, DO   1 year ago Edema of left foot   [facility] Leavy Mole, PA-C   1 year ago Essential hypertension   [facility] Bern[facility] Fend, DO       Future Appointments             In 6 months Bernardo Fend, DO [place] [facility]             citalopram  (CELEXA ) 20 MG tablet [Pharmacy Med Name: CITALOPRAM  20MG  TABLETS] 90 tablet 1    Sig: TAKE 1 TABLET BY MOUTH EVERY DAY AT BEDTIME     Psychiatry:  Antidepressants - SSRI Passed - 08/15/2023 11:56 AM      Passed - Valid encounter within last 6 months    Recent Outpatient Visits           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 [facility])   [place] [facility]end, DO   6 months ago Essential (primary) hypertension   [place] [facility], DO   12 months ago Essential (primary) hypertension   [place] [facility] Bern[facility] Fend, DO   1 year ago Edema of left foot   [facility] Leavy Mole, PA-C   1 year ago Essential hypertension   [facility] Bernardo Fend, DO       Future Appointments             In 6 months Bernardo Fend, DO  [facility]             busPIRone  (BUSPAR ) 5 MG tablet [Pharmacy Med Name: BUSPIRONE  5MG  TABLETS] 90 tablet 1    Sig: TAKE 1 TABLET(5 MG) BY MOUTH AT BEDTIME AS NEEDED FOR ANXIETY OR NERVES OR PANIC     Psychiatry: Anxiolytics/Hypnotics - Non-controlled Passed - 08/15/2023 11:56 AM      Passed - Valid encounter within last 12 months    Recent Outpatient Visits  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 [facility])   [place] [facility] Bern[facility]end, DO   6 months ago  Essential (primary) hypertension   [facility] DO   12 months ago Essential (primary) hypertension   [facility]end, DO   1 year ago Edema of left foot   [place] [facility] Leavy Mole, PA-C   1 year ago Essential hypertension   [facility] Bernardo Fend, DO       Future Appointments             In 6 months Bernardo Fend, DO [facility], [facility]

## 2023-08-15 NOTE — Assessment & Plan Note (Signed)
 Discussed with the patient that GFR has slightly improved from last time but still in the stage IIIb category.  Emphasized hydration and decreasing medications that can affect kidneys like anti-inflammatories, which she does not take regularly.  Will continue to monitor.

## 2023-08-15 NOTE — Assessment & Plan Note (Signed)
 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  Cholesterol panel from the summer controlled, continue Crestor, aspirin and Plavix.

## 2023-08-15 NOTE — Assessment & Plan Note (Signed)
 Stable, doing better on the Prilosec 40 mg but still has to take Tums once in a while.

## 2023-08-15 NOTE — Telephone Encounter (Signed)
 Requested by interface surescripts. Already signed 08/15/23. Medication dose change and medication discontinued 08/15/23. OV today future visit in 6 months. Requested Prescriptions  Pending Prescriptions Disp Refills   clopidogrel  (PLAVIX ) 75 MG tablet [Pharmacy Med Name: CLOPIDOGREL  75MG  TABLETS] 90 tablet 1    Sig: TAKE 1 TABLET(75 MG) BY MOUTH DAILY     Hematology: Antiplatelets - clopidogrel  Failed - 08/15/2023 11:55 AM      Failed - HCT in normal range and within 180 days    HCT  Date Value Ref Range Status  02/14/2023 42.1 35.0 - 45.0 % Final  10/02/2012 44.6 35.0 - 47.0 % Final         Failed - HGB in normal range and within 180 days    Hemoglobin  Date Value Ref Range Status  02/14/2023 14.1 11.7 - 15.5 g/dL Final   HGB  Date Value Ref Range Status  10/02/2012 15.3 12.0 - 16.0 g/dL Final         Failed - PLT in normal range and within 180 days    Platelets  Date Value Ref Range Status  02/14/2023 202 140 - 400 Thousand/uL Final   Platelet  Date Value Ref Range Status  10/02/2012 262 150 - 440 x10 3/mm 3 Final         Failed - Cr in normal range and within 360 days    Creat  Date Value Ref Range Status  02/14/2023 1.32 (H) 0.60 - 0.95 mg/dL Final         Passed - Valid encounter within last 6 months    Recent Outpatient Visits           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 [facility])   [place] [facility], DO   6 months ago Essential (primary) hypertension   [place] [facility]end, DO   12 months ago Essential (primary) hypertension   [place] [facility] Bernardo Fend, DO   1 year ago Edema of left foot   [facility] Leavy Mole, PA-C   1 year ago Essential hypertension   [facility] Bernardo Fend, DO       Future Appointments             In 6 months Bernardo Fend, DO [facility]            Refused Prescriptions Disp Refills   omeprazole  (PRILOSEC) 40 MG capsule [Pharmacy Med Name: OMEPRAZOLE  40MG  CAPSULES] 90 capsule 1    Sig: TAKE 1 CAPSULE(40 MG) BY MOUTH DAILY     Gastroenterology: Proton Pump Inhibitors Passed - 08/15/2023 11:55 AM      Passed - Valid encounter within last 12 months    Recent Outpatient Visits           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 [facility])   [place] [facility]end, DO   6 months ago Essential (primary) hypertension   [facility], DO   12 months ago Essential (primary) hypertension   [facility] Fend, DO   1 year ago Edema of left foot   [facility] Leavy Mole, PA-C   1 year ago Essential hypertension   [facility] Bernardo Fend, DO       Future Appointments  In 6 months Bernardo Fend, DO [place] [facility]             amLODipine  (NORVASC ) 10 MG tablet [Pharmacy Med Name: AMLODIPINE  BESYLATE 10MG  TABLETS] 90 tablet 1    Sig: TAKE 1 TABLET(10 MG) BY MOUTH DAILY     Cardiovascular: Calcium  Channel Blockers 2 Passed - 08/15/2023 11:55 AM      Passed - Last BP in normal range    BP Readings from Last 1 Encounters:  08/15/23 132/74         Passed - Last Heart Rate in normal range    Pulse Readings from Last 1 Encounters:  08/15/23 68         Passed - Valid encounter within last 6 months    Recent Outpatient Visits           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 [facility])   [place] [facility]end, DO   6 months ago Essential (primary) hypertension   [place] [facility] Fend, DO   12 months ago Essential (primary) hypertension   [facility]end, DO   1 year ago Edema of left  foot   [facility] Leavy Mole, PA-C   1 year ago Essential hypertension   [facility] Bern[facility] Fend, DO       Future Appointments             In 6 months Bernardo Fend, DO [facility]             citalopram  (CELEXA ) 20 MG tablet [Pharmacy Med Name: CITALOPRAM  20MG  TABLETS] 90 tablet 1    Sig: TAKE 1 TABLET BY MOUTH EVERY DAY AT BEDTIME     Psychiatry:  Antidepressants - SSRI Passed - 08/15/2023 11:55 AM      Passed - Valid encounter within last 6 months    Recent Outpatient Visits           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 [facility])   [place] [facility] Bern[facility] Fend, DO   6 months ago Essential (primary) hypertension   [facility] Bernardo Fend, DO   12 months ago Essential (primary) hypertension   [place] [facility] Fend, DO   1 year ago Edema of left foot   [facility] Leavy Mole, PA-C   1 year ago Essential hypertension   [facility] Fend, DO       Future Appointments             In 6 months Bernardo Fend, DO [facility]             busPIRone  (BUSPAR ) 5 MG tablet [Pharmacy Med Name: BUSPIRONE  5MG  TABLETS] 90 tablet 1    Sig: TAKE 1 TABLET(5 MG) BY MOUTH AT BEDTIME AS NEEDED FOR ANXIETY OR NERVES OR PANIC     Psychiatry: Anxiolytics/Hypnotics - Non-controlled Passed - 08/15/2023 11:55 AM      Passed - Valid encounter within last 12 months    Recent Outpatient Visits           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 [facility])   [facility] Bern[facility]end, DO   6 months ago Essential (primary) hypertension   [facility], DO   12 months  ago Essential (primary) hypertension   [place]  [facility] Bernardo Fend, DO   1 year ago Edema of left foot   [facility] Leavy Mole, PA-C   1 year ago Essential hypertension   [place] [facility] Bern[facility] Fend, DO       Future Appointments             In 6 months Bernardo Fend, DO [facility]
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
## 2023-08-15 NOTE — Assessment & Plan Note (Signed)
 Exacerbated lately, will continue Celexa 20 mg and increase BuSpar to 10 mg twice daily.

## 2023-08-20 ENCOUNTER — Other Ambulatory Visit (INDEPENDENT_AMBULATORY_CARE_PROVIDER_SITE_OTHER): Payer: Self-pay | Admitting: Vascular Surgery

## 2023-08-20 DIAGNOSIS — I714 Abdominal aortic aneurysm, without rupture, unspecified: Secondary | ICD-10-CM

## 2023-08-20 DIAGNOSIS — I6523 Occlusion and stenosis of bilateral carotid arteries: Secondary | ICD-10-CM

## 2023-08-23 ENCOUNTER — Encounter (INDEPENDENT_AMBULATORY_CARE_PROVIDER_SITE_OTHER): Payer: Self-pay | Admitting: Vascular Surgery

## 2023-08-23 ENCOUNTER — Ambulatory Visit (INDEPENDENT_AMBULATORY_CARE_PROVIDER_SITE_OTHER): Payer: Medicare Other | Admitting: Vascular Surgery

## 2023-08-23 ENCOUNTER — Ambulatory Visit (INDEPENDENT_AMBULATORY_CARE_PROVIDER_SITE_OTHER): Payer: Medicare Other

## 2023-08-23 VITALS — BP 158/82 | HR 56 | Resp 16 | Wt 152.8 lb

## 2023-08-23 DIAGNOSIS — I6523 Occlusion and stenosis of bilateral carotid arteries: Secondary | ICD-10-CM | POA: Diagnosis not present

## 2023-08-23 DIAGNOSIS — E785 Hyperlipidemia, unspecified: Secondary | ICD-10-CM

## 2023-08-23 DIAGNOSIS — I10 Essential (primary) hypertension: Secondary | ICD-10-CM | POA: Diagnosis not present

## 2023-08-23 DIAGNOSIS — N183 Chronic kidney disease, stage 3 unspecified: Secondary | ICD-10-CM | POA: Diagnosis not present

## 2023-08-23 DIAGNOSIS — I739 Peripheral vascular disease, unspecified: Secondary | ICD-10-CM

## 2023-08-23 DIAGNOSIS — I714 Abdominal aortic aneurysm, without rupture, unspecified: Secondary | ICD-10-CM

## 2023-08-23 DIAGNOSIS — I7143 Infrarenal abdominal aortic aneurysm, without rupture: Secondary | ICD-10-CM

## 2023-08-23 NOTE — Assessment & Plan Note (Signed)
Duplex today shows this to have increased in size now measuring 5.14 cm in maximal diameter.  Previously, this was 4.9 cm in maximal diameter. I had a long discussion today with the patient regarding her aneurysm.  This is now reached a size where we would generally recommend prophylactic repair.  She is adamant she does not want to have this fixed at this time.  She was told over 5 years ago that she was not well enough to have major surgery and at that time the only gave her about 2 years to live.  She seems reasonably well today and I told her we could certainly reassess her cardiac function and other medical comorbidities and try to optimize these prior to surgery, but she is adamant that she will not consider surgery at this time.  She has agreed to a follow-up visit in 6 months and says if this grows a lot larger then she may reconsider.  Duplex we planned for 6 months.

## 2023-08-23 NOTE — Progress Notes (Signed)
MRN : [id number]  Elizabeth [name] is a 83 y.o. ([birth year]) female who presents with chief complaint of  Chief Complaint  Patient presents with   Follow-up    3yr AAA and carotid ultrasound follow up  .  History of Present Illness: Patient returns today in follow up of multiple vascular issues.  She has done well and had no major complaints or problems since her last visit 6 months ago.  She is followed for her abdominal aortic aneurysm.  She denies any aneurysm related symptoms. Specifically, the patient denies new back or abdominal pain, or signs of peripheral embolization.  Duplex today shows this to have increased in size now measuring 5.14 cm in maximal diameter.  Previously, this was 4.9 cm in maximal diameter. She is also followed for carotid disease.  She has not had any focal neurologic symptoms. Specifically, the patient denies amaurosis fugax, speech or swallowing difficulties, or arm or leg weakness or numbness.  Carotid duplex today shows roughly stable velocities in the 1 to 39% range on the right in the 60 to 79% range on the left albeit the higher end of that range.  Current Outpatient Medications  Medication Sig Dispense Refill   amLODipine (NORVASC) 10 MG tablet Take 1 tablet (10 mg total) by mouth daily. 90 tablet 1   aspirin EC 81 MG tablet Take 1 tablet (81 mg total) by mouth daily. Swallow whole. 90 tablet 3   busPIRone (BUSPAR) 10 MG tablet Take 1 tablet (10 mg total) by mouth 2 (two) times daily. 180 tablet 1   cholecalciferol (VITAMIN D) 1000 units tablet Take 1,000 Units by mouth daily.     citalopram (CELEXA) 20 MG tablet TAKE 1 TABLET BY MOUTH EVERYDAY AT BEDTIME 90 tablet 1   clopidogrel (PLAVIX) 75 MG tablet TAKE 1 TABLET(75 MG) BY MOUTH DAILY 90 tablet 1   Cyanocobalamin (VITAMIN B-12 PO) Take 1 tablet by mouth daily.     furosemide (LASIX) 20 MG tablet TAKE 1 TABLET BY MOUTH EVERY DAY 90 tablet 1   glucosamine-chondroitin 500-400 MG tablet Take 1 tablet by  mouth daily.     HYDROcodone-acetaminophen (NORCO) 10-325 MG tablet Take 1 tablet by mouth every 4 (four) hours as needed for severe pain (pain score 7-10). Must last 30 days. 180 tablet 0   HYDROcodone-acetaminophen (NORCO) 10-325 MG tablet Take 1 tablet by mouth every 4 (four) hours as needed for severe pain (pain score 7-10). Must last 30 days. 180 tablet 0   isosorbide mononitrate (IMDUR) 30 MG 24 hr tablet TAKE 1/2 OF A TABLET (15 MG TOTAL) BY MOUTH DAILY 45 tablet 2   lisinopril (ZESTRIL) 40 MG tablet Take 1 tablet (40 mg total) by mouth daily. 90 tablet 3   metoprolol tartrate (LOPRESSOR) 100 MG tablet Take 1 tablet (100 mg total) by mouth 3 (three) times daily. 270 tablet 2   METRONIDAZOLE, TOPICAL, 0.75 % LOTN APPLY 1 APPLICATION. TOPICALLY 2 (TWO) TIMES DAILY. AFTER WASHING. 59 mL 0   Multiple Vitamin (MULTIVITAMIN) tablet Take 1 tablet by mouth daily.     omeprazole (PRILOSEC) 40 MG capsule Take 1 capsule (40 mg total) by mouth daily. 90 capsule 1   potassium citrate (UROCIT-K) 10 MEQ (1080 MG) SR tablet Take 1 tablet (10 mEq total) by mouth 2 (two) times daily. 180 tablet 0   rosuvastatin (CRESTOR) 40 MG tablet Take 1 tablet (40 mg total) by mouth daily. 90 tablet 3   HYDROcodone-acetaminophen (NORCO) 10-325 MG tablet  Take 1 tablet by mouth every 4 (four) hours as needed for severe pain (pain score 7-10). Must last 30 days. 180 tablet 0   nitroGLYCERIN (NITROSTAT) 0.4 MG SL tablet Place 1 tablet (0.4 mg total) under the tongue every 5 (five) minutes as needed for chest pain. 15 tablet 0   No current facility-administered medications for this visit.    Past Medical History:  Diagnosis Date   3-vessel CAD    05/2018 LHC with a single remaining conduit which is the left main coronary artery supplying the LAD.  Both the circumflex and right coronary artery were occluded.  The distal left main, proximal LAD is aneurysmal and heavily calcified.  The LAD supplies well-formed collaterals to  the PDA.  Not felt to be candidate for revascularization and with recommendation for medical mgmt   AAA (abdominal aortic aneurysm) (HCC)    4.3cm (12/2018)   Celiac artery stenosis (HCC)    Degenerative joint disease of left hip    Heart failure with preserved ejection fraction (HCC)    EF 60-65%, 05/2018   History of non-ST elevation myocardial infarction (NSTEMI)    05/2018 with subsequent LHC and in the setting of acute aortic ulcer and hematoma   Hyperlipidemia    Hypertension    Internal carotid artery stenosis, left    80-99% 05/2018   Intramural hematoma of thoracic aorta (HCC)    05/2018   Mitral regurgitation    Mild by 05/2018 echo   Paroxysmal SVT (supraventricular tachycardia) (HCC)    05/2018   Peripheral vascular disease (HCC)    extensive vascular dz s/p b/l carotid endarterectomies (2014) and iliac stenting, AAA, aortic ulcer   Right renal artery stenosis (HCC)    05/2018   Smoking hx    Quit ~1992   Status post bilateral carotid endarterectomy    Followed by Vascular surgery with most recent images 05/2018 showing L ICA 80-99% stenosis   Subclavian artery stenosis, left (HCC)    05/2018    Past Surgical History:  Procedure Laterality Date   APPENDECTOMY     CARDIAC CATHETERIZATION     HIP SURGERY Left    x3   LEFT HEART CATH AND CORONARY ANGIOGRAPHY N/A 05/19/2018   Procedure: LEFT HEART CATH AND CORONARY ANGIOGRAPHY;  Surgeon: Lyn Records, MD;  Location: [facility];  Service: Cardiovascular;  Laterality: N/A;   PERIPHERAL VASCULAR CATHETERIZATION Right 02/02/2016   Procedure: Lower Extremity Angiography;  Surgeon: Annice Needy, MD;  Location: [facility] INVASIVE CV LAB;  Service: Cardiovascular;  Laterality: Right;     Social History   Tobacco Use   Smoking status: Former    Current packs/day: 0.00    Types: Cigarettes    Quit date: 02/02/1999    Years since quitting: 24.5   Smokeless tobacco: Never  Vaping Use   Vaping status: Never Used   Substance Use Topics   Alcohol use: No   Drug use: No      Family History  Problem Relation Age of Onset   Hypertension Mother    Stroke Mother    Hypertension Father    Stroke Father    Hypertension Sister    Hyperlipidemia Sister    Hodgkin's lymphoma Son    Heart disease Son    Kidney disease Son    Mental illness Neg Hx      Allergies  Allergen Reactions   Carvedilol Other (See Comments)    intolerance   Codeine Other (See Comments)  Reaction: Unsure   Hydralazine Other (See Comments)    intolerance   Sulfa Antibiotics Other (See Comments)    Mouth blisters     REVIEW OF SYSTEMS (Negative unless checked)   Constitutional: [] Weight loss  [] Fever  [] Chills Cardiac: [] Chest pain   [] Chest pressure   [] Palpitations   [] Shortness of breath when laying flat   [x] Shortness of breath at rest   [x] Shortness of breath with exertion. 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   [] Asthma Neurologic:  [x] Dizziness  [] Blackouts   [] Seizures   [] History of stroke   [] History of TIA  [] Aphasia   [] Temporary blindness   [] Dysphagia   [] Weakness or numbness in arms   [] Weakness or numbness in legs Musculoskeletal:  [x] Arthritis   [] Joint swelling   [x] Joint pain   [] Low back pain Hematologic:  [] Easy bruising  [] Easy bleeding   [] Hypercoagulable state   [] Anemic  [] Hepatitis Gastrointestinal:  [] Blood in stool   [] Vomiting blood  [x] Gastroesophageal reflux/heartburn   [] Abdominal pain Genitourinary:  [x] Chronic kidney disease   [] Difficult urination  [] Frequent urination  [] Burning with urination   [] Hematuria Skin:  [] Rashes   [] Ulcers    [] Wounds Psychological:  [] History of anxiety   []  History of major depression.  Physical Examination  BP (!) 158/82   Pulse (!) 56   Resp 16   Wt 152 lb 12.8 oz (69.3 kg)   BMI 28.87 kg/m  Gen:  WD/WN, NAD Head: [place]/AT, No temporalis wasting. Ear/Nose/Throat: Hearing grossly intact, nares w/o erythema or drainage Eyes: Conjunctiva clear. Sclera non-icteric Neck: Supple.  Trachea midline Pulmonary:  Good air movement, no use of accessory muscles.  Cardiac: bradycardic Vascular:  Vessel Right Left  Radial Palpable Palpable                                   Gastrointestinal: soft, non-tender/non-distended. No guarding/reflex.  Musculoskeletal: M/S 5/5 throughout.  No deformity or atrophy. Trace LE edema. Neurologic: Sensation grossly intact in extremities.  Symmetrical.  Speech is fluent.  Psychiatric: Judgment intact, Mood & affect appropriate for pt's clinical situation. Dermatologic: No rashes or ulcers noted.  No cellulitis or open wounds.      Labs Recent Results (from the past 2160 hours)  [employer] Select 13 (MW), Urine     Status: None   Collection Time: 06/13/23  1:18 PM  Result Value Ref Range   Summary Note     Comment: ==================================================================== [employer] Select 13 (MW) ==================================================================== Test                             Result       Flag       Units  Drug Present and Declared for Prescription Verification   Hydrocodone                    3962         EXPECTED  ng/mg creat   Hydromorphone                  81           EXPECTED   ng/mg creat   Dihydrocodeine                 222          EXPECTED   ng/mg creat   Norhydrocodone                 >2262        EXPECTED   ng/mg creat    Sources of hydrocodone include scheduled prescription medications.    Hydromorphone, dihydrocodeine and norhydrocodone are expected    metabolites of hydrocodone. Hydromorphone and  dihydrocodeine are    also available as scheduled prescription medications.  ==================================================================== Test                      Result    Flag   Units      Ref Range   Creatinine              221               mg/dL      >=09 ==================================================================== Declared Medications:  The flagging and interpretation on this report are based on the  following declared medications.  Unexpected results may arise from  inaccuracies in the declared medications.   **Note: The testing scope of this panel includes these medications:   Hydrocodone (Norco)   **Note: The testing scope of this panel does not include the  following reported medications:   Acetaminophen (Norco)  Amlodipine (Norvasc)  Aspirin  Buspirone (Buspar)  Chondroitin  Citalopram (Celexa)  Clopidogrel (Plavix)  Furosemide (Lasix)  Glucosamine  Isosorbide (Imdur)  Lisinopril (Zestril)  Metoprolol (Lopressor)  Metronidazole  Multivitamin  Nitroglycerin (Nitrostat)  Omeprazole (Prilosec)  Potassium Chloride  Rosuvastatin (Crestor)  Vitamin B12  Vitamin D ==================================================================== For clinical consultation, please c all [phone]. ====================================================================     Radiology No results found.  Assessment/Plan  AAA (abdominal aortic aneurysm) (HCC) Duplex today shows this to have increased in size now measuring 5.14 cm in maximal diameter.  Previously, this was 4.9 cm in maximal diameter. I had a long discussion today with the patient regarding her aneurysm.  This is now reached a size where we would generally recommend prophylactic repair.  She is adamant she does not want to have this fixed at this time.  She was told over 5 years ago that she was not well enough to have major surgery and at that time the only gave her about 2 years to live.  She  seems reasonably well today and I told her we could certainly reassess her cardiac function and other medical comorbidities and try to optimize these prior to surgery, but she is adamant that she will not consider surgery at this time.  She has agreed to a follow-up visit in 6 months and says if this grows a lot larger then she may reconsider.  Duplex we planned for 6 months.  Carotid artery stenosis Carotid duplex today shows roughly stable velocities in the 1 to 39% range on the right in the 60 to 79% range on the left albeit the higher end of that range.  At this level, she is likely just below the threshold for consideration for surgery or stenting.  Again, she is adamant she does not want to have  any major procedures performed and said she has not had significant progression, I think that is reasonable.  I will plan to check this again in 6 months.  She will continue her Crestor, aspirin, and Plavix.  Essential hypertension blood pressure control important in reducing the progression of atherosclerotic disease and AAA growth. On appropriate oral medications.     CKD (chronic kidney disease) stage 3, GFR 30-59 ml/min (HCC) Limit contrast use and hydrate with any use of contrast.   Hyperlipidemia lipid control important in reducing the progression of atherosclerotic disease. Continue statin therapy  Festus Barren, MD  08/23/2023 11:25 AM    This note was created with Dragon medical transcription system.  Any errors from dictation are purely unintentional

## 2023-08-23 NOTE — Assessment & Plan Note (Addendum)
Carotid duplex today shows roughly stable velocities in the 1 to 39% range on the right in the 60 to 79% range on the left albeit the higher end of that range.  At this level, she is likely just below the threshold for consideration for surgery or stenting.  Again, she is adamant she does not want to have any major procedures performed and said she has not had significant progression, I think that is reasonable.  I will plan to check this again in 6 months.  She will continue her Crestor, aspirin, and Plavix.

## 2023-09-09 ENCOUNTER — Other Ambulatory Visit: Payer: Self-pay | Admitting: Internal Medicine

## 2023-09-09 DIAGNOSIS — I10 Essential (primary) hypertension: Secondary | ICD-10-CM

## 2023-09-10 ENCOUNTER — Other Ambulatory Visit: Payer: Self-pay | Admitting: Internal Medicine

## 2023-09-10 DIAGNOSIS — I10 Essential (primary) hypertension: Secondary | ICD-10-CM

## 2023-09-10 NOTE — Telephone Encounter (Signed)
 Rx 08/15/23 #180- too soon Requested Prescriptions  Pending Prescriptions Disp Refills   potassium citrate  (UROCIT-K ) 10 MEQ (1080 MG) SR tablet [Pharmacy Med Name: POTASSIUM CIT 1080MG  ( ) TABS] 180 tablet 0    Sig: TAKE 1 TABLET BY MOUTH TWICE DAILY     Endocrinology:  Minerals - Potassium Citrate  Failed - 09/10/2023 12:49 PM      Failed - CO2 in normal range and within 120 days    CO2  Date Value Ref Range Status  02/14/2023 31 20 - 32 mmol/L Final   Co2  Date Value Ref Range Status  10/02/2012 24 21 - 32 mmol/L Final   Bicarbonate  Date Value Ref Range Status  05/21/2018 26.0 20.0 - 28.0 mmol/L Final         Failed - Cl in normal range and within 120 days    Chloride  Date Value Ref Range Status  02/14/2023 105 98 - 110 mmol/L Final  10/02/2012 105 98 - 107 mmol/L Final         Failed - Cr in normal range and within 120 days    Creat  Date Value Ref Range Status  02/14/2023 1.32 (H) 0.60 - 0.95 mg/dL Final         Failed - K in normal range and within 120 days    Potassium  Date Value Ref Range Status  02/14/2023 4.9 3.5 - 5.3 mmol/L Final  10/02/2012 3.5 3.5 - 5.1 mmol/L Final         Failed - Na in normal range and within 120 days    Sodium  Date Value Ref Range Status  02/14/2023 140 135 - 146 mmol/L Final  05/29/2021 144 134 - 144 mmol/L Final  10/02/2012 137 136 - 145 mmol/L Final         Failed - WBC in normal range and within 120 days    WBC  Date Value Ref Range Status  02/14/2023 5.5 3.8 - 10.8 Thousand/uL Final         Failed - HGB in normal range and within 120 days    Hemoglobin  Date Value Ref Range Status  02/14/2023 14.1 11.7 - 15.5 g/dL Final   HGB  Date Value Ref Range Status  10/02/2012 15.3 12.0 - 16.0 g/dL Final         Failed - HCT in normal range and within 120 days    HCT  Date Value Ref Range Status  02/14/2023 42.1 35.0 - 45.0 % Final  10/02/2012 44.6 35.0 - 47.0 % Final         Failed - PLT in normal range and  within 120 days    Platelets  Date Value Ref Range Status  02/14/2023 202 140 - 400 Thousand/uL Final   Platelet  Date Value Ref Range Status  10/02/2012 262 150 - 440 x10 3/mm 3 Final         Failed - Urinalysis completed in last 4 months.    Specific Gravity, Urine  Date Value Ref Range Status  05/17/2018 1.017 1.005 - 1.030 Final   Glucose, UA  Date Value Ref Range Status  05/17/2018 NEGATIVE NEGATIVE mg/dL Final   Urobilinogen, UA  Date Value Ref Range Status  01/01/2011 0.2 0.0 - 1.0 mg/dL Final   Protein, ur  Date Value Ref Range Status  05/17/2018 30 (A) NEGATIVE mg/dL Final   Total Protein  Date Value Ref Range Status  02/14/2023 7.5 6.1 - 8.1 g/dL Final   Nitrite  Date Value Ref Range Status  05/17/2018 NEGATIVE NEGATIVE Final   RBC / HPF  Date Value Ref Range Status  05/17/2018 0-5 0 - 5 RBC/hpf Final   WBC, UA  Date Value Ref Range Status  05/17/2018 0-5 0 - 5 WBC/hpf Final         Passed - Valid encounter within last 4 months    Recent Outpatient Visits           3 weeks ago Essential (primary) hypertension   [facility] Bernardo Fend, DO   6 months ago Essential (primary) hypertension   [place] [facility] Bernardo Fend, DO   1 year ago Essential (primary) hypertension   [facility] Bernardo Fend, DO   1 year ago Edema of left foot   [facility] Leavy Mole, PA-C   1 year ago Essential hypertension   [facility] Bernardo Fend, DO       Future Appointments             In 5 months Bernardo Fend, DO [facility]

## 2023-09-10 NOTE — Telephone Encounter (Signed)
 Copied from CRM [id number]. Topic: General - Other >> Sep 10, 2023 12:55 PM Everette C wrote: Reason for CRM: Medication Refill - Most Recent Primary Care Visit:  Provider: BERNARDO FEND Department: [id number] Visit Type: OFFICE VISIT Date: 08/15/2023  Medication: potassium citrate  (UROCIT-K ) 10 MEQ (1080 MG) SR tablet [[id number]]  Has the patient contacted their pharmacy? Yes (Agent: If no, request that the patient contact the pharmacy for the refill. If patient does not wish to contact the pharmacy document the reason why and proceed with request.) (Agent: If yes, when and what did the pharmacy advise?)  Is this the correct pharmacy for this prescription? Yes If no, delete pharmacy and type the correct one.  This is the patient's preferred pharmacy:  [id number] [place], [place] - [address] AT [facility] OF [address] [place] [address] Phone: [phone] Fax: [phone]  Has the prescription been filled recently? Yes  Is the patient out of the medication? Yes  Has the patient been seen for an appointment in the last year OR does the patient have an upcoming appointment? Yes  Can we respond through MyChart? No  Agent: Please be advised that Rx refills may take up to 3 business days. We ask that you follow-up with your pharmacy.

## 2023-09-17 ENCOUNTER — Ambulatory Visit
Payer: Medicare Other | Attending: Student in an Organized Health Care Education/Training Program | Admitting: Student in an Organized Health Care Education/Training Program

## 2023-09-17 ENCOUNTER — Encounter: Payer: Self-pay | Admitting: Student in an Organized Health Care Education/Training Program

## 2023-09-17 VITALS — BP 162/69 | HR 57 | Temp 97.2°F | Resp 16 | Ht 61.0 in | Wt 153.0 lb

## 2023-09-17 DIAGNOSIS — I25118 Atherosclerotic heart disease of native coronary artery with other forms of angina pectoris: Secondary | ICD-10-CM | POA: Diagnosis not present

## 2023-09-17 DIAGNOSIS — Z9889 Other specified postprocedural states: Secondary | ICD-10-CM | POA: Diagnosis not present

## 2023-09-17 DIAGNOSIS — G8921 Chronic pain due to trauma: Secondary | ICD-10-CM | POA: Insufficient documentation

## 2023-09-17 DIAGNOSIS — M1652 Unilateral post-traumatic osteoarthritis, left hip: Secondary | ICD-10-CM | POA: Insufficient documentation

## 2023-09-17 DIAGNOSIS — I739 Peripheral vascular disease, unspecified: Secondary | ICD-10-CM | POA: Insufficient documentation

## 2023-09-17 MED ORDER — HYDROCODONE-ACETAMINOPHEN 10-325 MG PO TABS: 1.0000 | ORAL_TABLET | ORAL | 0 refills | Status: DC | PRN
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
## 2023-09-17 NOTE — Progress Notes (Signed)
Nursing Pain Medication Assessment:  Safety precautions to be maintained throughout the outpatient stay will include: orient to surroundings, keep bed in low position, maintain call bell within reach at all times, provide assistance with transfer out of bed and ambulation.  Medication Inspection Compliance: Pill count conducted under aseptic conditions, in front of the patient. Neither the pills nor the bottle was removed from the patient's sight at any time. Once count was completed pills were immediately returned to the patient in their original bottle.  Medication: Hydrocodone/APAP Pill/Patch Count:  32 of 180 pills remain Pill/Patch Appearance: Markings consistent with prescribed medication Bottle Appearance: Standard pharmacy container. Clearly labeled. Filled Date: 01 / 16 / 2025 Last Medication intake:  Today
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
## 2023-10-22 ENCOUNTER — Encounter: Payer: Self-pay | Admitting: Student

## 2023-10-22 ENCOUNTER — Ambulatory Visit: Payer: Medicare Other | Attending: Student | Admitting: Student

## 2023-10-22 VITALS — BP 150/84 | HR 62 | Ht 61.0 in | Wt 156.0 lb

## 2023-10-22 DIAGNOSIS — R0609 Other forms of dyspnea: Secondary | ICD-10-CM

## 2023-10-22 DIAGNOSIS — I10 Essential (primary) hypertension: Secondary | ICD-10-CM

## 2023-10-22 DIAGNOSIS — I6523 Occlusion and stenosis of bilateral carotid arteries: Secondary | ICD-10-CM | POA: Diagnosis not present

## 2023-10-22 DIAGNOSIS — I739 Peripheral vascular disease, unspecified: Secondary | ICD-10-CM

## 2023-10-22 DIAGNOSIS — R011 Cardiac murmur, unspecified: Secondary | ICD-10-CM

## 2023-10-22 DIAGNOSIS — E785 Hyperlipidemia, unspecified: Secondary | ICD-10-CM | POA: Diagnosis not present

## 2023-10-22 DIAGNOSIS — I7143 Infrarenal abdominal aortic aneurysm, without rupture: Secondary | ICD-10-CM

## 2023-10-22 DIAGNOSIS — I2510 Atherosclerotic heart disease of native coronary artery without angina pectoris: Secondary | ICD-10-CM | POA: Diagnosis not present

## 2023-10-22 DIAGNOSIS — T148XXS Other injury of unspecified body region, sequela: Secondary | ICD-10-CM

## 2023-10-22 DIAGNOSIS — T148XXA Other injury of unspecified body region, initial encounter: Secondary | ICD-10-CM

## 2023-10-22 NOTE — Patient Instructions (Signed)
 Medication Instructions:  Your Physician recommend you continue on your current medication as directed.    *If you need a refill on your cardiac medications before your next appointment, please call your pharmacy*   Lab Work: None ordered at this time    Follow-Up: At [facility], you and your health needs are our priority.  As part of our continuing mission to provide you with exceptional heart care, we have created designated Provider Care Teams.  These Care Teams include your primary Cardiologist (physician) and Advanced Practice Providers (APPs -  Physician Assistants and Nurse Practitioners) who all work together to provide you with the care you need, when you need it.   Your next appointment:   1 year(s) -- please call back in Jan or Feb for a March appt  Provider:   You may see Debbe Odea, MD or Carlos Levering, NP    Other Instructions BP log given - please take your BP everyday ay noon for 1 week and then report that to [place]. Either by calling or MyChart.

## 2023-11-01 ENCOUNTER — Telehealth: Payer: Self-pay | Admitting: Student

## 2023-11-01 NOTE — Telephone Encounter (Signed)
 Pt told to call in with BP readings after a week.   3/19: 135/70  3/20: 132/68 3/21: 130/64 3/22: 123/65 3/23: 125/70 3/24: 122/65 3/25: 129/72

## 2023-12-09 ENCOUNTER — Other Ambulatory Visit: Payer: Self-pay | Admitting: Internal Medicine

## 2023-12-09 DIAGNOSIS — I10 Essential (primary) hypertension: Secondary | ICD-10-CM

## 2023-12-11 ENCOUNTER — Telehealth: Payer: Self-pay | Admitting: Internal Medicine

## 2023-12-11 DIAGNOSIS — I10 Essential (primary) hypertension: Secondary | ICD-10-CM

## 2023-12-11 NOTE — Telephone Encounter (Signed)
 Copied from CRM [id number]. Topic: Clinical - Medication Refill >> Dec 11, 2023 10:27 AM Elle L wrote: Medication: potassium citrate  (UROCIT-K ) 10 MEQ (1080 MG) SR tablet  Has the patient contacted their pharmacy? Yes  This is the patient's preferred pharmacy:  [id number] [place], [place] - [address] AT [facility] [address] [place] [address] Phone: [phone] Fax: [phone]  Is this the correct pharmacy for this prescription? Yes  Has the prescription been filled recently? No  Is the patient out of the medication? Yes, she has been out for two days.   Has the patient been seen for an appointment in the last year OR does the patient have an upcoming appointment? Yes  Can we respond through MyChart? Yes  Agent: Please be advised that Rx refills may take up to 3 business days. We ask that you follow-up with your pharmacy.

## 2023-12-11 NOTE — Telephone Encounter (Signed)
 Requested Prescriptions  Pending Prescriptions Disp Refills   potassium citrate  (UROCIT-K ) 10 MEQ (1080 MG) SR tablet [Pharmacy Med Name: POTASSIUM CIT 1080MG  ( ) TABS] 180 tablet 0    Sig: TAKE 1 TABLET BY MOUTH TWICE DAILY     Endocrinology:  Minerals - Potassium Citrate  Failed - 12/11/2023 12:31 PM      Failed - CO2 in normal range and within 120 days    CO2  Date Value Ref Range Status  02/14/2023 31 20 - 32 mmol/L Final   Co2  Date Value Ref Range Status  10/02/2012 24 21 - 32 mmol/L Final   Bicarbonate  Date Value Ref Range Status  05/21/2018 26.0 20.0 - 28.0 mmol/L Final         Failed - Cl in normal range and within 120 days    Chloride  Date Value Ref Range Status  02/14/2023 105 98 - 110 mmol/L Final  10/02/2012 105 98 - 107 mmol/L Final         Failed - Cr in normal range and within 120 days    Creat  Date Value Ref Range Status  02/14/2023 1.32 (H) 0.60 - 0.95 mg/dL Final         Failed - K in normal range and within 120 days    Potassium  Date Value Ref Range Status  02/14/2023 4.9 3.5 - 5.3 mmol/L Final  10/02/2012 3.5 3.5 - 5.1 mmol/L Final         Failed - Na in normal range and within 120 days    Sodium  Date Value Ref Range Status  02/14/2023 140 135 - 146 mmol/L Final  05/29/2021 144 134 - 144 mmol/L Final  10/02/2012 137 136 - 145 mmol/L Final         Failed - WBC in normal range and within 120 days    WBC  Date Value Ref Range Status  02/14/2023 5.5 3.8 - 10.8 Thousand/uL Final         Failed - HGB in normal range and within 120 days    Hemoglobin  Date Value Ref Range Status  02/14/2023 14.1 11.7 - 15.5 g/dL Final   HGB  Date Value Ref Range Status  10/02/2012 15.3 12.0 - 16.0 g/dL Final         Failed - HCT in normal range and within 120 days    HCT  Date Value Ref Range Status  02/14/2023 42.1 35.0 - 45.0 % Final  10/02/2012 44.6 35.0 - 47.0 % Final         Failed - PLT in normal range and within 120 days    Platelets   Date Value Ref Range Status  02/14/2023 202 140 - 400 Thousand/uL Final   Platelet  Date Value Ref Range Status  10/02/2012 262 150 - 440 x10 3/mm 3 Final         Failed - Urinalysis completed in last 4 months.    Specific Gravity, Urine  Date Value Ref Range Status  05/17/2018 1.017 1.005 - 1.030 Final   Glucose, UA  Date Value Ref Range Status  05/17/2018 NEGATIVE NEGATIVE mg/dL Final   Urobilinogen, UA  Date Value Ref Range Status  01/01/2011 0.2 0.0 - 1.0 mg/dL Final   Protein, ur  Date Value Ref Range Status  05/17/2018 30 (A) NEGATIVE mg/dL Final   Total Protein  Date Value Ref Range Status  02/14/2023 7.5 6.1 - 8.1 g/dL Final   Nitrite  Date Value Ref Range  Status  05/17/2018 NEGATIVE NEGATIVE Final   RBC / HPF  Date Value Ref Range Status  05/17/2018 0-5 0 - 5 RBC/hpf Final   WBC, UA  Date Value Ref Range Status  05/17/2018 0-5 0 - 5 WBC/hpf Final         Passed - Valid encounter within last 4 months    Recent Outpatient Visits   None     Future Appointments             In 2 months Rockney Cid, DO [place] [facility], [facility]

## 2023-12-11 NOTE — Progress Notes (Unsigned)
 PROVIDER NOTE: Interpretation of information contained herein should be left to medically-trained personnel. Specific patient instructions are provided elsewhere under "Patient Instructions" section of medical record. This document was created in part using AI and STT-dictation technology, any transcriptional errors that may result from this process are unintentional.  Patient: Elizabeth [name]  Service: E/M   PCP: Rockney Cid, DO  DOB: 01/03/1941  DOS: 12/12/2023  Provider: Cherylin Corrigan, NP  MRN: [id number]  Delivery: Face-to-face  Specialty: Interventional Pain Management  Type: Established Patient  Setting: Ambulatory outpatient facility  Specialty designation: 09  Referring Prov.: Rockney Cid, DO  Location: Outpatient office facility       HPI  Elizabeth [name], a 83 y.o. year old female, is here today because of her No primary diagnosis found.. Elizabeth [name]'s primary complain today is No chief complaint on file.   Pain Assessment: Severity of   is reported as a  /10. Location:    / . Onset:  . Quality:  . Timing:  . Modifying factor(s):  Aaron Aas Vitals:  vitals were not taken for this visit.  BMI: Estimated body mass index is 29.48 kg/m as calculated from the following:   Height as of 10/22/23: 5\' 1"  (1.549 m).   Weight as of 10/22/23: 156 lb (70.8 kg). Last encounter: 09/17/2023.  Reason for encounter: medication management. No change in medical history since last visit.  Patient's pain is at baseline.  Patient continues multimodal pain regimen as prescribed.  States that it provides pain relief and improvement in functional status.   Pharmacotherapy Assessment  Analgesic: {There is no content from the last Subjective section.}   Monitoring: [place] PMP: PDMP not reviewed this encounter.       Pharmacotherapy: No side-effects or adverse reactions reported. Compliance: No problems identified. Effectiveness: Clinically acceptable.  No notes on file  No results found for:  "CBDTHCR" No results found for: "D8THCCBX" No results found for: "D9THCCBX"  UDS:  Summary  Date Value Ref Range Status  06/13/2023 Note  Final    Comment:    ==================================================================== [facility] ([facility]) ==================================================================== Test                             Result       Flag       Units  Drug Present and Declared for Prescription Verification   Hydrocodone                     3962         EXPECTED   ng/mg creat   Hydromorphone                   81           EXPECTED   ng/mg creat   Dihydrocodeine                 222          EXPECTED   ng/mg creat   Norhydrocodone                 >2262        EXPECTED   ng/mg creat    Sources of hydrocodone  include scheduled prescription medications.    Hydromorphone , dihydrocodeine and norhydrocodone are expected    metabolites of hydrocodone . Hydromorphone  and dihydrocodeine are    also available as scheduled prescription medications.  ==================================================================== Test  Result    Flag   Units      Ref Range   Creatinine              221              mg/dL      >=47 ==================================================================== Declared Medications:  The flagging and interpretation on this report are based on the  following declared medications.  Unexpected results may arise from  inaccuracies in the declared medications.   **Note: The testing scope of this panel includes these medications:   Hydrocodone  (Norco)   **Note: The testing scope of this panel does not include the  following reported medications:   Acetaminophen  (Norco)  Amlodipine  (Norvasc )  Aspirin   Buspirone  (Buspar )  Chondroitin  Citalopram  (Celexa )  Clopidogrel  (Plavix )  Furosemide  (Lasix )  Glucosamine  Isosorbide  (Imdur )  Lisinopril  (Zestril )  Metoprolol  (Lopressor )  Metronidazole   Multivitamin   Nitroglycerin  (Nitrostat )  Omeprazole  (Prilosec)  Potassium Chloride   Rosuvastatin  (Crestor )  Vitamin B12  Vitamin D  ==================================================================== For clinical consultation, please call [phone]. ====================================================================       ROS  Constitutional: Denies any fever or chills Gastrointestinal: No reported hemesis, hematochezia, vomiting, or acute GI distress Musculoskeletal: Denies any acute onset joint swelling, redness, loss of ROM, or weakness Neurological: No reported episodes of acute onset apraxia, aphasia, dysarthria, agnosia, amnesia, paralysis, loss of coordination, or loss of consciousness  Medication Review  Cyanocobalamin, HYDROcodone -acetaminophen , METRONIDAZOLE  (TOPICAL), amLODipine , aspirin  EC, busPIRone , cholecalciferol , citalopram , clopidogrel , furosemide , glucosamine-chondroitin, isosorbide  mononitrate, lisinopril , metoprolol  tartrate, multivitamin, nitroGLYCERIN , omeprazole , potassium citrate , and rosuvastatin   History Review  Allergy: Elizabeth [name] is allergic to carvedilol , codeine, hydralazine , and sulfa antibiotics. Drug: Elizabeth [name]  reports no history of drug use. Alcohol:  reports no history of alcohol use. Tobacco:  reports that she quit smoking about 24 years ago. Her smoking use included cigarettes. She has never used smokeless tobacco. Social: Elizabeth [name]  reports that she quit smoking about 24 years ago. Her smoking use included cigarettes. She has never used smokeless tobacco. She reports that she does not drink alcohol and does not use drugs. Medical:  has a past medical history of 3-vessel CAD, AAA (abdominal aortic aneurysm) (HCC), Celiac artery stenosis (HCC), Degenerative joint disease of left hip, Heart failure with preserved ejection fraction (HCC), History of non-ST elevation myocardial infarction (NSTEMI), Hyperlipidemia, Hypertension, Internal carotid artery  stenosis, left, Intramural hematoma of thoracic aorta (HCC), Mitral regurgitation, Paroxysmal SVT (supraventricular tachycardia) (HCC), Peripheral vascular disease (HCC), Right renal artery stenosis (HCC), Smoking hx, Status post bilateral carotid endarterectomy, and Subclavian artery stenosis, left (HCC). Surgical: Elizabeth [name]  has a past surgical history that includes Cardiac catheterization (Right, 02/02/2016); Appendectomy; LEFT HEART CATH AND CORONARY ANGIOGRAPHY (N/A, 05/19/2018); Cardiac catheterization; and Hip surgery (Left). Family: family history includes Heart disease in her son; Hodgkin's lymphoma in her son; Hyperlipidemia in her sister; Hypertension in her father, mother, and sister; Kidney disease in her son; Stroke in her father and mother.  Laboratory Chemistry Profile   Renal Lab Results  Component Value Date   BUN 26 (H) 02/14/2023   CREATININE 1.32 (H) 02/14/2023   BCR 20 02/14/2023   GFRAA 65 10/25/2020   GFRNONAA 56 (L) 10/25/2020    Hepatic Lab Results  Component Value Date   AST 21 02/14/2023   ALT 13 02/14/2023   ALBUMIN 2.8 (L) 05/21/2018   ALKPHOS 62 05/18/2018   AMYLASE 132 (H) 05/17/2018   LIPASE 29 05/17/2018    Electrolytes Lab Results  Component  Value Date   NA 140 02/14/2023   K 4.9 02/14/2023   CL 105 02/14/2023   CALCIUM  9.8 02/14/2023   MG 1.9 05/20/2018   PHOS 3.1 05/21/2018    Bone Lab Results  Component Value Date   VD25OH 61 02/14/2023    Inflammation (CRP: Acute Phase) (ESR: Chronic Phase) Lab Results  Component Value Date   LATICACIDVEN 3.48 (HH) 05/17/2018         Note: Above Lab results reviewed.  Recent Imaging Review  VAS US  CAROTID Carotid Arterial Duplex Study  Patient Name:  KIZ SANTAANA  Date of Exam:   08/23/2023 Medical Rec #: [id number]         Accession #:    [id number] Date of Birth: 08-16-1940         Patient Gender: F Patient Age:   89 years Exam Location:  [facility] Procedure:      VAS US   CAROTID Referring Phys: Mikki Alexander  --------------------------------------------------------------------------------   Indications:  Carotid artery disease and bilateral endarterectomies. Risk Factors: Hypertension, hyperlipidemia, past history of smoking, prior MI,               coronary artery disease.  Performing Technologist: Oneta Bilberry RVT    Examination Guidelines: A complete evaluation includes B-mode imaging, spectral Doppler, color Doppler, and power Doppler as needed of all accessible portions of each vessel. Bilateral testing is considered an integral part of a complete examination. Limited examinations for reoccurring indications may be performed as noted.    Right Carotid Findings: +----------+--------+--------+--------+----------------------+--------+           PSV cm/sEDV cm/sStenosisPlaque Description    Comments +----------+--------+--------+--------+----------------------+--------+ CCA Prox  148     18      <50%    calcific and irregular         +----------+--------+--------+--------+----------------------+--------+ CCA Mid   103     17                                             +----------+--------+--------+--------+----------------------+--------+ CCA Distal81      17      <50%    calcific and irregular         +----------+--------+--------+--------+----------------------+--------+ ICA Prox  69      17      1-39%   calcific and irregular         +----------+--------+--------+--------+----------------------+--------+ ICA Mid   76      23                                             +----------+--------+--------+--------+----------------------+--------+ ICA Distal95      22                                             +----------+--------+--------+--------+----------------------+--------+ ECA       104     0                                               +----------+--------+--------+--------+----------------------+--------+  +----------+--------+-------+----------------+-------------------+  PSV cm/sEDV cmsDescribe        Arm Pressure (mmHG) +----------+--------+-------+----------------+-------------------+ Subclavian105     0      Multiphasic, WNL                    +----------+--------+-------+----------------+-------------------+  +---------+--------+---+--------+--+---------+ VertebralPSV cm/s105EDV cm/s22Antegrade +---------+--------+---+--------+--+---------+     Left Carotid Findings: +----------+--------+--------+--------+-------------------+--------------------+           PSV cm/sEDV cm/sStenosisPlaque Description Comments             +----------+--------+--------+--------+-------------------+--------------------+ CCA Prox  83      17                                                      +----------+--------+--------+--------+-------------------+--------------------+ CCA Mid   54      10                                                      +----------+--------+--------+--------+-------------------+--------------------+ CCA Distal111     23      <50%    calcific                                +----------+--------+--------+--------+-------------------+--------------------+ ICA Prox  340     81      60-79%  calcific and                                                              irregular                               +----------+--------+--------+--------+-------------------+--------------------+ ICA Mid   110     24                                 post stenotic                                                             turbulence           +----------+--------+--------+--------+-------------------+--------------------+ ICA Distal42      13                                                       +----------+--------+--------+--------+-------------------+--------------------+ ECA       336     40      >50%                                            +----------+--------+--------+--------+-------------------+--------------------+  +----------+--------+--------+--------+-------------------+  PSV cm/sEDV cm/sDescribeArm Pressure (mmHG) +----------+--------+--------+--------+-------------------+ [id number]      0       Stenotic                    +----------+--------+--------+--------+-------------------+  +---------+--------+---+--------+--+---------+ VertebralPSV cm/s133EDV cm/s18Antegrade +---------+--------+---+--------+--+---------+        Summary: Right Carotid: Velocities in the right ICA are consistent with a 1-39% stenosis.                Non-hemodynamically significant plaque <50% noted in the CCA.  Left Carotid: Velocities in the left ICA are consistent with a 60-79% stenosis.               Non-hemodynamically significant plaque <50% noted in the CCA. The               ECA appears >50% stenosed.  Vertebrals:  Bilateral vertebral arteries demonstrate antegrade flow. Subclavians: Left subclavian artery was stenotic. Normal flow hemodynamics were              seen in the right subclavian artery.  *See table(s) above for measurements and observations.    Electronically signed by Mikki Alexander MD on 08/27/2023 at 2:14:40 PM.      Final   VAS US  AAA DUPLEX ABDOMINAL AORTA STUDY  Patient Name:  HIBAH GIACONA  Date of Exam:   08/23/2023 Medical Rec #: [id number]         Accession #:    [id number] Date of Birth: 10/04/1940         Patient Gender: F Patient Age:   25 years Exam Location:  [facility] Procedure:      VAS US  AAA DUPLEX Referring Phys: Mikki Alexander  --------------------------------------------------------------------------------   Indications: Follow up exam for known AAA.  Risk Factors: Hyperlipidemia,  past history of smoking, prior MI.  Vascular Interventions: 02/02/16: Left CIA & EIA PTA/stent with aorta PTA.  Limitations: Air/bowel gas.    Performing Technologist: Oneta Bilberry RVT    Examination Guidelines: A complete evaluation includes B-mode imaging, spectral Doppler, color Doppler, and power Doppler as needed of all accessible portions of each vessel. Bilateral testing is considered an integral part of a complete examination. Limited examinations for reoccurring indications may be performed as noted.    Abdominal Aorta Findings: +-----------+-------+----------+----------+--------+--------+--------+ Location   AP (cm)Trans (cm)PSV (cm/s)WaveformThrombusComments +-----------+-------+----------+----------+--------+--------+--------+ Proximal   2.12   2.01      83                                 +-----------+-------+----------+----------+--------+--------+--------+ Mid        4.92   5.14      128                                +-----------+-------+----------+----------+--------+--------+--------+ Distal     1.58   1.85      96                                 +-----------+-------+----------+----------+--------+--------+--------+ RT CIA Prox1.1    1.2       56                                 +-----------+-------+----------+----------+--------+--------+--------+ LT CIA Prox1.1  1.3       99                                 +-----------+-------+----------+----------+--------+--------+--------+     Summary: Abdominal Aorta: There is evidence of abnormal dilatation of the mid Abdominal aorta. The largest aortic measurement is 5.1 cm. The largest aortic diameter has increased compared to prior exam. Previous diameter measurement was 4.9 cm obtained on  11/28/2022.   *See table(s) above for measurements and observations.   Electronically signed by Mikki Alexander MD on 08/27/2023 at 2:14:34 PM.       Final   Note: Reviewed         Physical Exam  General appearance: Well nourished, well developed, and well hydrated. In no apparent acute distress Mental status: Alert, oriented x 3 (person, place, & time)       Respiratory: No evidence of acute respiratory distress Eyes: PERLA Vitals: There were no vitals taken for this visit. BMI: Estimated body mass index is 29.48 kg/m as calculated from the following:   Height as of 10/22/23: 5\' 1"  (1.549 m).   Weight as of 10/22/23: 156 lb (70.8 kg). Ideal: Patient weight not recorded  Assessment   Diagnosis Status  No diagnosis found. Controlled Controlled Controlled   Plan of Care  Assessment and Plan             Pharmacotherapy (Medications Ordered): No orders of the defined types were placed in this encounter.  Orders:  No orders of the defined types were placed in this encounter.  Follow-up plan:   No follow-ups on file.    Recent Visits Date Type Provider Dept  09/17/23 Office Visit Cephus Collin, MD [facility]  Showing recent visits within past 90 days and meeting all other requirements Future Appointments Date Type Provider Dept  12/12/23 Appointment Daiden Coltrane K, NP [facility]  Showing future appointments within next 90 days and meeting all other requirements  I discussed the assessment and treatment plan with the patient. The patient was provided an opportunity to ask questions and all were answered. The patient agreed with the plan and demonstrated an understanding of the instructions.  Patient advised to call back or seek an in-person evaluation if the symptoms or condition worsens.  Duration of encounter: *** minutes.  Total time on encounter, as per AMA guidelines included both the face-to-face and non-face-to-face time personally spent by the physician and/or other qualified health care professional(s) on the day of the encounter (includes time in activities that require the physician or other qualified health care  professional and does not include time in activities normally performed by clinical staff). Physician's time may include the following activities when performed: Preparing to see the patient (e.g., pre-charting review of records, searching for previously ordered imaging, lab work, and nerve conduction tests) Review of prior analgesic pharmacotherapies. Reviewing PMP Interpreting ordered tests (e.g., lab work, imaging, nerve conduction tests) Performing post-procedure evaluations, including interpretation of diagnostic procedures Obtaining and/or reviewing separately obtained history Performing a medically appropriate examination and/or evaluation Counseling and educating the patient/family/caregiver Ordering medications, tests, or procedures Referring and communicating with other health care professionals (when not separately reported) Documenting clinical information in the electronic or other health record Independently interpreting results (not separately reported) and communicating results to the patient/ family/caregiver Care coordination (not separately reported)  Note by: Justyce Yeater K Truth Wolaver, NP (TTS and AI technology used. I apologize  for any typographical errors that were not detected and corrected.) Date: 12/12/2023; Time: 2:42 PM

## 2023-12-12 ENCOUNTER — Encounter: Payer: Self-pay | Admitting: Nurse Practitioner

## 2023-12-12 ENCOUNTER — Ambulatory Visit: Attending: Nurse Practitioner | Admitting: Nurse Practitioner

## 2023-12-12 ENCOUNTER — Encounter: Payer: Medicare Other | Admitting: Student in an Organized Health Care Education/Training Program

## 2023-12-12 VITALS — BP 146/65 | HR 65 | Temp 97.2°F | Resp 18 | Ht 61.0 in | Wt 150.0 lb

## 2023-12-12 DIAGNOSIS — Z9889 Other specified postprocedural states: Secondary | ICD-10-CM | POA: Diagnosis not present

## 2023-12-12 DIAGNOSIS — M5442 Lumbago with sciatica, left side: Secondary | ICD-10-CM | POA: Insufficient documentation

## 2023-12-12 DIAGNOSIS — G8921 Chronic pain due to trauma: Secondary | ICD-10-CM | POA: Diagnosis not present

## 2023-12-12 DIAGNOSIS — M1652 Unilateral post-traumatic osteoarthritis, left hip: Secondary | ICD-10-CM | POA: Insufficient documentation

## 2023-12-12 DIAGNOSIS — M25552 Pain in left hip: Secondary | ICD-10-CM | POA: Insufficient documentation

## 2023-12-12 DIAGNOSIS — Z79891 Long term (current) use of opiate analgesic: Secondary | ICD-10-CM | POA: Insufficient documentation

## 2023-12-12 DIAGNOSIS — I739 Peripheral vascular disease, unspecified: Secondary | ICD-10-CM | POA: Diagnosis not present

## 2023-12-12 DIAGNOSIS — G8929 Other chronic pain: Secondary | ICD-10-CM | POA: Insufficient documentation

## 2023-12-12 MED ORDER — HYDROCODONE-ACETAMINOPHEN 10-325 MG PO TABS: 1.0000 | ORAL_TABLET | ORAL | 0 refills | Status: DC | PRN

## 2023-12-12 NOTE — Progress Notes (Signed)
 Nursing Pain Medication Assessment:  Safety precautions to be maintained throughout the outpatient stay will include: orient to surroundings, keep bed in low position, maintain call bell within reach at all times, provide assistance with transfer out of bed and ambulation.   Medication Inspection Compliance: Pill count conducted under aseptic conditions, in front of the patient. Neither the pills nor the bottle was removed from the patient's sight at any time. Once count was completed pills were immediately returned to the patient in their original bottle.  Medication: Hydrocodone /APAP Pill/Patch Count: 64 of 180 pills remain Pill/Patch Appearance: Markings consistent with prescribed medication Bottle Appearance: Standard pharmacy container. Clearly labeled. Filled Date: 04 / 17 / 2025 Last Medication intake:  Today

## 2023-12-12 NOTE — Patient Instructions (Signed)
Medication Rules  Applies to: All patients receiving prescriptions (written or electronic).  Pharmacy of record: Pharmacy where electronic prescriptions will be sent. If written prescriptions are taken to a different pharmacy, please inform the nursing staff. The pharmacy listed in the electronic medical record should be the one where you would like electronic prescriptions to be sent.  Prescription refills: Only during scheduled appointments. Applies to both, written and electronic prescriptions.  NOTE: The following applies primarily to controlled substances (Opioid* Pain Medications).   Patient's responsibilities: Pain Pills: Bring all pain pills to every appointment (except for procedure appointments). Pill Bottles: Bring pills in original pharmacy bottle. Always bring newest bottle. Bring bottle, even if empty. Medication refills: You are responsible for knowing and keeping track of what medications you need refilled. The day before your appointment, write a list of all prescriptions that need to be refilled. Bring that list to your appointment and give it to the admitting nurse. Prescriptions will be written only during appointments. If you forget a medication, it will not be "Called in", "Faxed", or "electronically sent". You will need to get another appointment to get these prescribed. Prescription Accuracy: You are responsible for carefully inspecting your prescriptions before leaving our office. Have the discharge nurse carefully go over each prescription with you, before taking them home. Make sure that your name is accurately spelled, that your address is correct. Check the name and dose of your medication to make sure it is accurate. Check the number of pills, and the written instructions to make sure they are clear and accurate. Make sure that you are given enough medication to last until your next medication refill appointment. Taking Medication: Take medication as prescribed. Never  take more pills than instructed. Never take medication more frequently than prescribed. Taking less pills or less frequently is permitted and encouraged, when it comes to controlled substances (written prescriptions).  Inform other Doctors: Always inform, all of your healthcare providers, of all the medications you take. Pain Medication from other Providers: You are not allowed to accept any additional pain medication from any other Doctor or Healthcare provider. There are two exceptions to this rule. (see below) In the event that you require additional pain medication, you are responsible for notifying us, as stated below. Medication Agreement: You are responsible for carefully reading and following our Medication Agreement. This must be signed before receiving any prescriptions from our practice. Safely store a copy of your signed Agreement. Violations to the Agreement will result in no further prescriptions. (Additional copies of our Medication Agreement are available upon request.) Laws, Rules, & Regulations: All patients are expected to follow all Federal and State Laws, Statutes, Rules, & Regulations. Ignorance of the Laws does not constitute a valid excuse. The use of any illegal substances is prohibited. Adopted CDC guidelines & recommendations: Target dosing levels will be at or below 60 MME/day. Use of benzodiazepines** is not recommended.  Exceptions: There are only two exceptions to the rule of not receiving pain medications from other Healthcare Providers. Exception #1 (Emergencies): In the event of an emergency (i.e.: accident requiring emergency care), you are allowed to receive additional pain medication. However, you are responsible for: As soon as you are able, call our office [phone], at any time of the day or night, and leave a message stating your name, the date and nature of the emergency, and the name and dose of the medication prescribed. In the event that your call is answered  by a member of   our staff, make sure to document and save the date, time, and the name of the person that took your information.  Exception #2 (Planned Surgery): In the event that you are scheduled by another doctor or dentist to have any type of surgery or procedure, you are allowed (for a period no longer than 30 days), to receive additional pain medication, for the acute post-op pain. However, in this case, you are responsible for picking up a copy of our "Post-op Pain Management for Surgeons" handout, and giving it to your surgeon or dentist. This document is available at our office, and does not require an appointment to obtain it. Simply go to our office during business hours (Monday-Thursday from 8:00 AM to 4:00 PM) (Friday 8:00 AM to 12:00 Noon) or if you have a scheduled appointment with us, prior to your surgery, and ask for it by name. In addition, you will need to provide us with your name, name of your surgeon, type of surgery, and date of procedure or surgery.  *Opioid medications include: morphine, codeine, oxycodone, oxymorphone, hydrocodone, hydromorphone, meperidine, tramadol, tapentadol, buprenorphine, fentanyl, methadone. **Benzodiazepine medications include: diazepam (Valium), alprazolam (Xanax), clonazepam (Klonopine), lorazepam (Ativan), clorazepate (Tranxene), chlordiazepoxide (Librium), estazolam (Prosom), oxazepam (Serax), temazepam (Restoril), triazolam (Halcion) (Last updated: 10/03/2017)  

## 2023-12-13 NOTE — Telephone Encounter (Signed)
 Rx 12/11/23 #180 - duplicate request Requested Prescriptions  Refused Prescriptions Disp Refills   potassium citrate  (UROCIT-K ) 10 MEQ (1080 MG) SR tablet 180 tablet 0    Sig: Take 1 tablet (10 mEq total) by mouth 2 (two) times daily.     Endocrinology:  Minerals - Potassium Citrate  Failed - 12/13/2023 10:47 AM      Failed - CO2 in normal range and within 120 days    CO2  Date Value Ref Range Status  02/14/2023 31 20 - 32 mmol/L Final   Co2  Date Value Ref Range Status  10/02/2012 24 21 - 32 mmol/L Final   Bicarbonate  Date Value Ref Range Status  05/21/2018 26.0 20.0 - 28.0 mmol/L Final         Failed - Cl in normal range and within 120 days    Chloride  Date Value Ref Range Status  02/14/2023 105 98 - 110 mmol/L Final  10/02/2012 105 98 - 107 mmol/L Final         Failed - Cr in normal range and within 120 days    Creat  Date Value Ref Range Status  02/14/2023 1.32 (H) 0.60 - 0.95 mg/dL Final         Failed - K in normal range and within 120 days    Potassium  Date Value Ref Range Status  02/14/2023 4.9 3.5 - 5.3 mmol/L Final  10/02/2012 3.5 3.5 - 5.1 mmol/L Final         Failed - Na in normal range and within 120 days    Sodium  Date Value Ref Range Status  02/14/2023 140 135 - 146 mmol/L Final  05/29/2021 144 134 - 144 mmol/L Final  10/02/2012 137 136 - 145 mmol/L Final         Failed - WBC in normal range and within 120 days    WBC  Date Value Ref Range Status  02/14/2023 5.5 3.8 - 10.8 Thousand/uL Final         Failed - HGB in normal range and within 120 days    Hemoglobin  Date Value Ref Range Status  02/14/2023 14.1 11.7 - 15.5 g/dL Final   HGB  Date Value Ref Range Status  10/02/2012 15.3 12.0 - 16.0 g/dL Final         Failed - HCT in normal range and within 120 days    HCT  Date Value Ref Range Status  02/14/2023 42.1 35.0 - 45.0 % Final  10/02/2012 44.6 35.0 - 47.0 % Final         Failed - PLT in normal range and within 120 days     Platelets  Date Value Ref Range Status  02/14/2023 202 140 - 400 Thousand/uL Final   Platelet  Date Value Ref Range Status  10/02/2012 262 150 - 440 x10 3/mm 3 Final         Failed - Urinalysis completed in last 4 months.    Specific Gravity, Urine  Date Value Ref Range Status  05/17/2018 1.017 1.005 - 1.030 Final   Glucose, UA  Date Value Ref Range Status  05/17/2018 NEGATIVE NEGATIVE mg/dL Final   Urobilinogen, UA  Date Value Ref Range Status  01/01/2011 0.2 0.0 - 1.0 mg/dL Final   Protein, ur  Date Value Ref Range Status  05/17/2018 30 (A) NEGATIVE mg/dL Final   Total Protein  Date Value Ref Range Status  02/14/2023 7.5 6.1 - 8.1 g/dL Final   Nitrite  Date  Value Ref Range Status  05/17/2018 NEGATIVE NEGATIVE Final   RBC / HPF  Date Value Ref Range Status  05/17/2018 0-5 0 - 5 RBC/hpf Final   WBC, UA  Date Value Ref Range Status  05/17/2018 0-5 0 - 5 WBC/hpf Final         Passed - Valid encounter within last 4 months    Recent Outpatient Visits   None     Future Appointments             In 2 months Rockney Cid, DO [facility], [facility]

## 2023-12-24 ENCOUNTER — Encounter (INDEPENDENT_AMBULATORY_CARE_PROVIDER_SITE_OTHER): Payer: Self-pay

## 2024-01-07 ENCOUNTER — Ambulatory Visit: Admitting: Nurse Practitioner

## 2024-02-11 ENCOUNTER — Other Ambulatory Visit: Payer: Self-pay | Admitting: Internal Medicine

## 2024-02-11 DIAGNOSIS — E785 Hyperlipidemia, unspecified: Secondary | ICD-10-CM

## 2024-02-11 DIAGNOSIS — I25118 Atherosclerotic heart disease of native coronary artery with other forms of angina pectoris: Secondary | ICD-10-CM

## 2024-02-12 ENCOUNTER — Other Ambulatory Visit: Payer: Self-pay | Admitting: Internal Medicine

## 2024-02-12 DIAGNOSIS — I739 Peripheral vascular disease, unspecified: Secondary | ICD-10-CM

## 2024-02-13 ENCOUNTER — Ambulatory Visit (INDEPENDENT_AMBULATORY_CARE_PROVIDER_SITE_OTHER): Payer: Self-pay | Admitting: Internal Medicine

## 2024-02-13 ENCOUNTER — Other Ambulatory Visit: Payer: Self-pay

## 2024-02-13 ENCOUNTER — Encounter: Payer: Self-pay | Admitting: Internal Medicine

## 2024-02-13 VITALS — BP 126/78 | HR 64 | Temp 97.9°F | Resp 16 | Ht 61.0 in | Wt 155.8 lb

## 2024-02-13 DIAGNOSIS — I25118 Atherosclerotic heart disease of native coronary artery with other forms of angina pectoris: Secondary | ICD-10-CM | POA: Diagnosis not present

## 2024-02-13 DIAGNOSIS — F419 Anxiety disorder, unspecified: Secondary | ICD-10-CM

## 2024-02-13 DIAGNOSIS — K219 Gastro-esophageal reflux disease without esophagitis: Secondary | ICD-10-CM | POA: Diagnosis not present

## 2024-02-13 DIAGNOSIS — E785 Hyperlipidemia, unspecified: Secondary | ICD-10-CM | POA: Diagnosis not present

## 2024-02-13 DIAGNOSIS — I10 Essential (primary) hypertension: Secondary | ICD-10-CM

## 2024-02-13 DIAGNOSIS — E538 Deficiency of other specified B group vitamins: Secondary | ICD-10-CM

## 2024-02-13 DIAGNOSIS — I739 Peripheral vascular disease, unspecified: Secondary | ICD-10-CM | POA: Diagnosis not present

## 2024-02-13 MED ORDER — AMLODIPINE BESYLATE 10 MG PO TABS: 10.0000 mg | ORAL_TABLET | Freq: Every day | ORAL | 1 refills | Status: DC

## 2024-02-13 MED ORDER — CITALOPRAM HYDROBROMIDE 20 MG PO TABS: ORAL_TABLET | ORAL | 1 refills | Status: DC

## 2024-02-13 MED ORDER — ROSUVASTATIN CALCIUM 40 MG PO TABS: 40.0000 mg | ORAL_TABLET | Freq: Every day | ORAL | 3 refills | Status: AC

## 2024-02-13 MED ORDER — BUSPIRONE HCL 10 MG PO TABS: 10.0000 mg | ORAL_TABLET | Freq: Two times a day (BID) | ORAL | 1 refills | Status: DC

## 2024-02-13 MED ORDER — CLOPIDOGREL BISULFATE 75 MG PO TABS: 75.0000 mg | ORAL_TABLET | Freq: Every day | ORAL | 1 refills | Status: DC

## 2024-02-13 MED ORDER — OMEPRAZOLE 40 MG PO CPDR: 40.0000 mg | DELAYED_RELEASE_CAPSULE | Freq: Every day | ORAL | 1 refills | Status: DC

## 2024-02-13 MED ORDER — FAMOTIDINE 40 MG PO TABS: 40.0000 mg | ORAL_TABLET | Freq: Every day | ORAL | 1 refills | Status: DC | PRN

## 2024-02-13 MED ORDER — ISOSORBIDE MONONITRATE ER 30 MG PO TB24: ORAL_TABLET | ORAL | 2 refills | Status: DC

## 2024-02-13 MED ORDER — METOPROLOL TARTRATE 100 MG PO TABS: 100.0000 mg | ORAL_TABLET | Freq: Three times a day (TID) | ORAL | 2 refills | Status: DC

## 2024-02-13 NOTE — Telephone Encounter (Signed)
 Refilled today in a separate encounter, refusing this request.   Requested Prescriptions  Pending Prescriptions Disp Refills   rosuvastatin  (CRESTOR ) 40 MG tablet [Pharmacy Med Name: ROSUVASTATIN  40MG  TABLETS] 90 tablet 3    Sig: TAKE 1 TABLET(40 MG) BY MOUTH DAILY     Cardiovascular:  Antilipid - Statins 2 Failed - 02/13/2024  3:44 PM      Failed - Cr in normal range and within 360 days    Creat  Date Value Ref Range Status  02/14/2023 1.32 (H) 0.60 - 0.95 mg/dL Final         Failed - Lipid Panel in normal range within the last 12 months    Cholesterol  Date Value Ref Range Status  02/14/2023 142 <200 mg/dL Final   LDL Cholesterol (Calc)  Date Value Ref Range Status  02/14/2023 71 mg/dL (calc) Final    Comment:    Reference range: <100 . Desirable range <100 mg/dL for primary prevention;   <70 mg/dL for patients with CHD or diabetic patients  with > or = 2 CHD risk factors. SABRA LDL-C is now calculated using the Martin-Hopkins  calculation, which is a validated novel method providing  better accuracy than the Friedewald equation in the  estimation of LDL-C.  Gladis APPLETHWAITE et al. SANDREA. 7986;689(80): 2061-2068  (http://education.QuestDiagnostics.com/faq/FAQ164)    HDL  Date Value Ref Range Status  02/14/2023 44 (L) > OR = 50 mg/dL Final   Triglycerides  Date Value Ref Range Status  02/14/2023 196 (H) <150 mg/dL Final         Passed - Patient is not pregnant      Passed - Valid encounter within last 12 months    Recent Outpatient Visits           Today Essential (primary) hypertension   [facility] [place], [place]

## 2024-02-13 NOTE — Patient Instructions (Signed)
 RSV Vaccine: What You Need to Know Many vaccine information statements are available in Spanish and other languages. See ClassThemes.[email or web address]. 1. Why get vaccinated? RSV vaccine can prevent lower respiratory tract disease caused by respiratory syncytial virus (RSV). RSV is a common respiratory virus that usually causes mild, cold-like symptoms. RSV can cause illness in people of all ages but may be especially serious for infants and older adults. RSV is the most common cause of hospitalization in [place] infants. Infants up to 36 months of age (especially those 6 months and younger) and children who were born prematurely, or who have chronic lung or heart disease, or a weakened immune system, are at increased risk of severe RSV disease. RSV infections can be dangerous for certain adults. Adults at highest risk for severe RSV disease include older adults, especially those with chronic heart or lung disease, a weakened immune system, certain other chronic medical conditions, or who live in nursing homes. RSV spreads through direct contact with the virus, such as when droplets from an infected person's cough or sneeze contact your eyes, nose, or mouth. It can also be spread by someone touching a surface, such as a doorknob, that has the virus on it, and then touching your face. Symptoms of RSV infection may include runny nose, decreased appetite, coughing, sneezing, fever, or wheezing. In very young infants, symptoms of RSV may also include irritability (fussiness), decreased activity, or apnea (pauses in breathing for more than 10 seconds). Most people recover in a week or two, but RSV can be more serious, resulting in shortness of breath and low oxygen levels. RSV can cause bronchiolitis (inflammation of the small airways in the lung) and pneumonia (infection of the lungs). RSV can also lead to worsening of other medical conditions such as asthma, chronic obstructive pulmonary disease (a chronic disease of the  lungs that makes it hard to breathe), or heart failure (when the heart cannot pump enough blood and oxygen throughout the body). Infants and older adults who get very sick from RSV may need to be hospitalized. Some may even die. 2. RSV vaccine There are two immunization options available for protecting infants against RSV: maternal vaccine for the pregnant person or preventive antibodies given to the baby. Only one of these options is needed for most babies to be protected. CDC recommends a one-time dose of RSV vaccine for pregnant people from week 32 through week 36 of pregnancy for the prevention of RSV disease in their infants during the first 6 months of life. This vaccine is recommended to be given from September through January for most of the [place] . However, in some locations (for example, the territories, [place] , [place] , and parts of [place] ), the timing of vaccination may differ based on the time of year when RSV circulates in the area. CDC recommends a one-time-dose of RSV vaccine for everyone 75 years and older and for adults 66 through 81 years of age who are at increased risk of severe RSV disease. Adults 63 through 81 years old who are at increased risk include those with chronic heart or lung disease, a weakened immune system, or certain other chronic medical conditions, and those who are residents of nursing homes. RSV vaccine may be given at the same time as other vaccines. 3. Talk with your health care provider Tell your vaccination provider if the person getting the vaccine: Has had an allergic reaction after a previous dose of RSV vaccine, or has any severe, life-threatening allergies In some cases, your health  care provider may decide to postpone RSV vaccination until a future visit.  People with minor illnesses, such as a cold, may be vaccinated. People who are moderately or severely ill should usually wait until they recover before getting RSV vaccine.  Your health care  provider can give you more information. 4. Risks of a vaccine reaction Pain, redness, and swelling where the shot is given, fatigue (feeling tired), fever, headache, nausea, diarrhea, and muscle or joint pain can happen after RSV vaccination. Serious neurologic conditions, including Guillain-Barr syndrome (GBS), have been reported after RSV vaccination in some older adults. At this time, an increased risk of GBS following RSV vaccine among persons aged 39 years and older cannot be confirmed or ruled out. Preterm birth and high blood pressure during pregnancy, including pre-eclampsia, have been reported among pregnant people who received RSV vaccine. It is unclear whether these events were caused by the vaccine. People sometimes faint after medical procedures, including vaccination. Tell your provider if you feel dizzy or have vision changes or ringing in the ears.  As with any medicine, there is a very remote chance of a vaccine causing a severe allergic reaction, other serious injury, or death. V-Safe is a safety monitoring system that lets you share with CDC how you, or your dependent, feel after getting RSV vaccine. You can find information and enroll in V-Safe RadarLocations.no. 5. What if there is a serious problem? An allergic reaction could occur after the vaccinated person leaves the clinic. If you see signs of a severe allergic reaction (hives, swelling of the face and throat, difficulty breathing, a fast heartbeat, dizziness, or weakness), call 9-1-1 and get the person to the nearest hospital. For other signs that concern you, call your health care provider.  Adverse reactions should be reported to the Vaccine Adverse Event Reporting System (VAERS). Your health care provider will usually file this report, or you can do it yourself. Visit the VAERS website at www.vaers.LAgents.no or call 937-036-2828. VAERS is only for reporting reactions, and VAERS staff members do not give medical advice. 6. How  can I learn more? Ask your health care provider. Call your local or state health department. Visit the website of the Food and Drug Administration (FDA) for vaccine package inserts and additional information at FinderList.no Contact the Centers for Disease Control and Prevention (CDC): Call 256-431-3537 (1-800-CDC-INFO) or Visit CDC's vaccine website at PicCapture.uy Source: CDC Vaccine Information Statement RSV (Respiratory Syncytial Virus) Vaccine (05/23/2023) This same material is available at TonerPromos.no for no charge. This information is not intended to replace advice given to you by your health care provider. Make sure you discuss any questions you have with your health care provider. Document Revised: 06/18/2023 Document Reviewed: 08/08/2022 Elsevier Patient Education  2024 [employer].
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
## 2024-02-16 ENCOUNTER — Other Ambulatory Visit: Payer: Self-pay | Admitting: Internal Medicine

## 2024-02-16 DIAGNOSIS — I10 Essential (primary) hypertension: Secondary | ICD-10-CM

## 2024-02-16 DIAGNOSIS — F419 Anxiety disorder, unspecified: Secondary | ICD-10-CM

## 2024-02-16 DIAGNOSIS — K219 Gastro-esophageal reflux disease without esophagitis: Secondary | ICD-10-CM

## 2024-02-18 NOTE — Telephone Encounter (Signed)
 Refused 4 refills because they are duplicate requests for Amlodipine , Celexa , Buspar  and Omeprazole .

## 2024-02-19 DIAGNOSIS — Z23 Encounter for immunization: Secondary | ICD-10-CM | POA: Diagnosis not present

## 2024-02-19 DIAGNOSIS — I10 Essential (primary) hypertension: Secondary | ICD-10-CM | POA: Diagnosis not present

## 2024-02-19 DIAGNOSIS — E785 Hyperlipidemia, unspecified: Secondary | ICD-10-CM | POA: Diagnosis not present

## 2024-02-19 DIAGNOSIS — I25118 Atherosclerotic heart disease of native coronary artery with other forms of angina pectoris: Secondary | ICD-10-CM | POA: Diagnosis not present

## 2024-02-19 LAB — COMPREHENSIVE METABOLIC PANEL WITH GFR
AG Ratio: 1.3 (calc) (ref 1.0–2.5)
ALT: 12 U/L (ref 6–29)
AST: 19 U/L (ref 10–35)
Albumin: 4.3 g/dL (ref 3.6–5.1)
Alkaline phosphatase (APISO): 84 U/L (ref 37–153)
BUN/Creatinine Ratio: 20 (calc) (ref 6–22)
BUN: 25 mg/dL (ref 7–25)
CO2: 29 mmol/L (ref 20–32)
Calcium: 9.5 mg/dL (ref 8.6–10.4)
Chloride: 104 mmol/L (ref 98–110)
Creat: 1.25 mg/dL — ABNORMAL HIGH (ref 0.60–0.95)
Globulin: 3.2 g/dL (ref 1.9–3.7)
Glucose, Bld: 79 mg/dL (ref 65–99)
Potassium: 5.3 mmol/L (ref 3.5–5.3)
Sodium: 140 mmol/L (ref 135–146)
Total Bilirubin: 0.3 mg/dL (ref 0.2–1.2)
Total Protein: 7.5 g/dL (ref 6.1–8.1)
eGFR: 43 mL/min/1.73m2 — ABNORMAL LOW (ref >=60)

## 2024-02-19 LAB — CBC WITH DIFFERENTIAL/PLATELET
Absolute Lymphocytes: 2245 {cells}/uL (ref 850–3900)
Absolute Monocytes: 804 {cells}/uL (ref 200–950)
Basophils Absolute: 20 {cells}/uL (ref 0–200)
Basophils Relative: 0.3 %
Eosinophils Absolute: 127 {cells}/uL (ref 15–500)
Eosinophils Relative: 1.9 %
HCT: 41.4 % (ref 35.0–45.0)
Hemoglobin: 13.8 g/dL (ref 11.7–15.5)
MCH: 34.6 pg — ABNORMAL HIGH (ref 27.0–33.0)
MCHC: 33.3 g/dL (ref 32.0–36.0)
MCV: 103.8 fL — ABNORMAL HIGH (ref 80.0–100.0)
MPV: 11.3 fL (ref 7.5–12.5)
Monocytes Relative: 12 %
Neutro Abs: 3504 {cells}/uL (ref 1500–7800)
Neutrophils Relative %: 52.3 %
Platelets: 194 Thousand/uL (ref 140–400)
RBC: 3.99 Million/uL (ref 3.80–5.10)
RDW: 12.4 % (ref 11.0–15.0)
Total Lymphocyte: 33.5 %
WBC: 6.7 Thousand/uL (ref 3.8–10.8)

## 2024-02-19 LAB — LIPID PANEL
Cholesterol: 138 mg/dL (ref <200)
HDL: 36 mg/dL — ABNORMAL LOW (ref >=50)
LDL Cholesterol (Calc): 67 mg/dL
Non-HDL Cholesterol (Calc): 102 mg/dL (ref <130)
Total CHOL/HDL Ratio: 3.8 (calc) (ref <5.0)
Triglycerides: 267 mg/dL — ABNORMAL HIGH (ref <150)

## 2024-02-19 LAB — VITAMIN B12: Vitamin B-12: 1690 pg/mL — ABNORMAL HIGH (ref 200–1100)

## 2024-02-20 ENCOUNTER — Ambulatory Visit: Payer: Self-pay | Admitting: Internal Medicine

## 2024-02-25 ENCOUNTER — Other Ambulatory Visit (INDEPENDENT_AMBULATORY_CARE_PROVIDER_SITE_OTHER): Payer: Medicare Other

## 2024-02-25 ENCOUNTER — Ambulatory Visit (INDEPENDENT_AMBULATORY_CARE_PROVIDER_SITE_OTHER): Payer: Medicare Other | Admitting: Vascular Surgery

## 2024-02-25 ENCOUNTER — Encounter (INDEPENDENT_AMBULATORY_CARE_PROVIDER_SITE_OTHER): Payer: Medicare Other

## 2024-03-03 ENCOUNTER — Other Ambulatory Visit: Payer: Self-pay | Admitting: Internal Medicine

## 2024-03-03 DIAGNOSIS — I10 Essential (primary) hypertension: Secondary | ICD-10-CM

## 2024-03-04 NOTE — Telephone Encounter (Signed)
 Requested medication (s) are due for refill today:   Yes  Requested medication (s) are on the active medication list:   Yes  Future visit scheduled:   Yes 08/17/2024      LOV 02/13/2024   Last ordered: 12/11/2023 #180, 0 refills  Unable to refill because urinalysis due per protocol   Requested Prescriptions  Pending Prescriptions Disp Refills   potassium citrate  (UROCIT-K ) 10 MEQ (1080 MG) SR tablet [Pharmacy Med Name: POTASSIUM CIT 1080MG  ( ) TABS] 180 tablet 0    Sig: TAKE 1 TABLET BY MOUTH TWICE DAILY     Endocrinology:  Minerals - Potassium Citrate  Failed - 03/04/2024  3:07 PM      Failed - Cr in normal range and within 120 days    Creat  Date Value Ref Range Status  02/19/2024 1.25 (H) 0.60 - 0.95 mg/dL Final         Failed - Valid encounter within last 4 months    Recent Outpatient Visits           2 weeks ago Essential (primary) hypertension   [facility] Bernardo Fend, DO              Failed - Urinalysis completed in last 4 months.    Specific Gravity, Urine  Date Value Ref Range Status  05/17/2018 1.017 1.005 - 1.030 Final   Glucose, UA  Date Value Ref Range Status  05/17/2018 NEGATIVE NEGATIVE mg/dL Final   Urobilinogen, UA  Date Value Ref Range Status  01/01/2011 0.2 0.0 - 1.0 mg/dL Final   Protein, ur  Date Value Ref Range Status  05/17/2018 30 (A) NEGATIVE mg/dL Final   Total Protein  Date Value Ref Range Status  02/19/2024 7.5 6.1 - 8.1 g/dL Final   Nitrite  Date Value Ref Range Status  05/17/2018 NEGATIVE NEGATIVE Final   RBC / HPF  Date Value Ref Range Status  05/17/2018 0-5 0 - 5 RBC/hpf Final   WBC, UA  Date Value Ref Range Status  05/17/2018 0-5 0 - 5 WBC/hpf Final         Passed - CO2 in normal range and within 120 days    CO2  Date Value Ref Range Status  02/19/2024 29 20 - 32 mmol/L Final   Co2  Date Value Ref Range Status  10/02/2012 24 21 - 32 mmol/L Final   Bicarbonate  Date Value  Ref Range Status  05/21/2018 26.0 20.0 - 28.0 mmol/L Final         Passed - Cl in normal range and within 120 days    Chloride  Date Value Ref Range Status  02/19/2024 104 98 - 110 mmol/L Final  10/02/2012 105 98 - 107 mmol/L Final         Passed - K in normal range and within 120 days    Potassium  Date Value Ref Range Status  02/19/2024 5.3 3.5 - 5.3 mmol/L Final  10/02/2012 3.5 3.5 - 5.1 mmol/L Final         Passed - Na in normal range and within 120 days    Sodium  Date Value Ref Range Status  02/19/2024 140 135 - 146 mmol/L Final  05/29/2021 144 134 - 144 mmol/L Final  10/02/2012 137 136 - 145 mmol/L Final         Passed - WBC in normal range and within 120 days    WBC  Date Value Ref Range Status  02/19/2024 6.7 3.8 -  10.8 Thousand/uL Final         Passed - HGB in normal range and within 120 days    Hemoglobin  Date Value Ref Range Status  02/19/2024 13.8 11.7 - 15.5 g/dL Final   HGB  Date Value Ref Range Status  10/02/2012 15.3 12.0 - 16.0 g/dL Final         Passed - HCT in normal range and within 120 days    HCT  Date Value Ref Range Status  02/19/2024 41.4 35.0 - 45.0 % Final  10/02/2012 44.6 35.0 - 47.0 % Final         Passed - PLT in normal range and within 120 days    Platelets  Date Value Ref Range Status  02/19/2024 194 140 - 400 Thousand/uL Final   Platelet  Date Value Ref Range Status  10/02/2012 262 150 - 440 x10 3/mm 3 Final

## 2024-03-06 ENCOUNTER — Ambulatory Visit

## 2024-03-06 VITALS — BP 126/78 | Ht 61.0 in | Wt 150.0 lb

## 2024-03-06 DIAGNOSIS — Z0000 Encounter for general adult medical examination without abnormal findings: Secondary | ICD-10-CM | POA: Diagnosis not present

## 2024-03-06 DIAGNOSIS — Z2882 Immunization not carried out because of caregiver refusal: Secondary | ICD-10-CM

## 2024-03-06 NOTE — Patient Instructions (Signed)
 [name] , Thank you for taking time out of your busy schedule to complete your Annual Wellness Visit with me. I enjoyed our conversation and look forward to speaking with you again next year. I, as well as your care team,  appreciate your ongoing commitment to your health goals. Please review the following plan we discussed and let me know if I can assist you in the future. Your Game plan/ To Do List    Referrals: If you haven't heard from the office you've been referred to, please reach out to them at the phone provided.   Follow up Visits: We will see or speak with you next year for your Next Medicare AWV with our clinical staff Have you seen your provider in the last 6 months (3 months if uncontrolled diabetes)? No  Clinician Recommendations:  Aim for 30 minutes of exercise or brisk walking, 6-8 glasses of water, and 5 servings of fruits and vegetables each day.       This is a list of the screenings recommended for you:  Health Maintenance  Topic Date Due   DEXA scan (bone density measurement)  Never done   COVID-19 Vaccine (7 - 2024-25 season) 04/07/2023   Flu Shot  03/06/2024   Zoster (Shingles) Vaccine (1 of 2) 05/15/2024*   Medicare Annual Wellness Visit  03/06/2025   Pneumococcal Vaccine for age over 9  Completed   Hepatitis B Vaccine  Aged Out   HPV Vaccine  Aged Out   Meningitis B Vaccine  Aged Out   DTaP/Tdap/Td vaccine  Discontinued   Hepatitis C Screening  Discontinued  *Topic was postponed. The date shown is not the original due date.    Advanced directives: (Declined) Advance directive discussed with you today. Even though you declined this today, please call our office should you change your mind, and we can give you the proper paperwork for you to fill out. Advance Care Planning is important because it:  [x]  Makes sure you receive the medical care that is consistent with your values, goals, and preferences  [x]  It provides guidance to your family and loved  ones and reduces their decisional burden about whether or not they are making the right decisions based on your wishes.  Follow the link provided in your after visit summary or read over the paperwork we have mailed to you to help you started getting your Advance Directives in place. If you need assistance in completing these, please reach out to us  so that we can help you!  See attachments for Preventive Care and Fall Prevention Tips.

## 2024-03-06 NOTE — Progress Notes (Signed)
 Because this visit was a virtual/telehealth visit,  certain criteria was not obtained, such a blood pressure, CBG if applicable, and timed get up and go. Any medications not marked as taking were not mentioned during the medication reconciliation part of the visit. Any vitals not documented were not able to be obtained due to this being a telehealth visit or patient was unable to self-report a recent blood pressure reading due to a lack of equipment at home via telehealth. Vitals that have been documented are verbally provided by the patient.   This visit was performed by a medical professional under my direct supervision. I was immediately available for consultation/collaboration. I have reviewed and agree with the Annual Wellness Visit documentation.  Subjective:   Elizabeth [name] is a 83 y.o. who presents for a Medicare Wellness preventive visit.  As a reminder, Annual Wellness Visits don't include a physical exam, and some assessments may be limited, especially if this visit is performed virtually. We may recommend an in-person follow-up visit with your provider if needed.  Visit Complete: Virtual I connected with  Elizabeth [name] on 03/06/24 by a audio enabled telemedicine application and verified that I am speaking with the correct person using two identifiers.  Patient Location: Home  Provider Location: Home Office  I discussed the limitations of evaluation and management by telemedicine. The patient expressed understanding and agreed to proceed.  Vital Signs: Because this visit was a virtual/telehealth visit, some criteria may be missing or patient reported. Any vitals not documented were not able to be obtained and vitals that have been documented are patient reported.  VideoDeclined- This patient declined Librarian, academic. Therefore the visit was completed with audio only.  Persons Participating in Visit: Patient.  AWV Questionnaire: No: Patient  Medicare AWV questionnaire was not completed prior to this visit.  Cardiac Risk Factors include: advanced age (>48men, >2 women);hypertension;dyslipidemia     Objective:    Today's Vitals   03/06/24 1057 03/06/24 1058  BP: 126/78   Weight: 150 lb (68 kg)   Height: 5' 1 (1.549 m)   PainSc:  3    Body mass index is 28.34 kg/m.     03/06/2024   10:57 AM 12/12/2023   10:05 AM 06/13/2023   12:50 PM 03/19/2023   11:00 AM 03/07/2023   10:57 AM 12/06/2022   11:15 AM 09/04/2022   10:28 AM  Advanced Directives  Does Patient Have a Medical Advance Directive? No No No Yes Yes No Yes  Type of Agricultural consultant;Living will  Healthcare Power of Parchment;Living will  Does patient want to make changes to medical advance directive?    No - Patient declined     Would patient like information on creating a medical advance directive? No - Patient declined No - Patient declined No - Patient declined   No - Patient declined     Current Medications (verified) Outpatient Encounter Medications as of 03/06/2024  Medication Sig   amLODipine  (NORVASC ) 10 MG tablet Take 1 tablet (10 mg total) by mouth daily.   aspirin  EC 81 MG tablet Take 1 tablet (81 mg total) by mouth daily. Swallow whole.   busPIRone  (BUSPAR ) 10 MG tablet Take 1 tablet (10 mg total) by mouth 2 (two) times daily.   cholecalciferol  (VITAMIN D ) 1000 units tablet Take 1,000 Units by mouth daily.   citalopram  (CELEXA ) 20 MG tablet TAKE 1 TABLET BY MOUTH EVERYDAY AT BEDTIME  clopidogrel  (PLAVIX ) 75 MG tablet Take 1 tablet (75 mg total) by mouth daily.   Cyanocobalamin (VITAMIN B-12 PO) Take 1 tablet by mouth daily.   famotidine  (PEPCID ) 40 MG tablet Take 1 tablet (40 mg total) by mouth daily as needed for heartburn or indigestion.   furosemide  (LASIX ) 20 MG tablet TAKE 1 TABLET BY MOUTH EVERY DAY   glucosamine-chondroitin 500-400 MG tablet Take 1 tablet by mouth daily.   HYDROcodone -acetaminophen  (NORCO) 10-325 MG  tablet Take 1 tablet by mouth every 4 (four) hours as needed for severe pain (pain score 7-10). Must last 30 days.   HYDROcodone -acetaminophen  (NORCO) 10-325 MG tablet Take 1 tablet by mouth every 4 (four) hours as needed for severe pain (pain score 7-10). Must last 30 days.   HYDROcodone -acetaminophen  (NORCO) 10-325 MG tablet Take 1 tablet by mouth every 4 (four) hours as needed for severe pain (pain score 7-10). Must last 30 days.   isosorbide  mononitrate (IMDUR ) 30 MG 24 hr tablet TAKE 1/2 OF A TABLET (15 MG TOTAL) BY MOUTH DAILY   lisinopril  (ZESTRIL ) 40 MG tablet Take 1 tablet (40 mg total) by mouth daily.   metoprolol  tartrate (LOPRESSOR ) 100 MG tablet Take 1 tablet (100 mg total) by mouth 3 (three) times daily.   METRONIDAZOLE , TOPICAL, 0.75 % LOTN APPLY 1 APPLICATION. TOPICALLY 2 (TWO) TIMES DAILY. AFTER WASHING.   Multiple Vitamin (MULTIVITAMIN) tablet Take 1 tablet by mouth daily.   nitroGLYCERIN  (NITROSTAT ) 0.4 MG SL tablet Place 1 tablet (0.4 mg total) under the tongue every 5 (five) minutes as needed for chest pain.   omeprazole  (PRILOSEC) 40 MG capsule Take 1 capsule (40 mg total) by mouth daily.   potassium citrate  (UROCIT-K ) 10 MEQ (1080 MG) SR tablet TAKE 1 TABLET BY MOUTH TWICE DAILY   rosuvastatin  (CRESTOR ) 40 MG tablet Take 1 tablet (40 mg total) by mouth daily.   No facility-administered encounter medications on file as of 03/06/2024.    Allergies (verified) Carvedilol , Codeine, Hydralazine , and Sulfa antibiotics   History: Past Medical History:  Diagnosis Date   3-vessel CAD    05/2018 LHC with a single remaining conduit which is the left main coronary artery supplying the LAD.  Both the circumflex and [name] coronary artery were occluded.  The distal left main, proximal LAD is aneurysmal and heavily calcified.  The LAD supplies well-formed collaterals to the PDA.  Not felt to be candidate for revascularization and with recommendation for medical mgmt   AAA (abdominal  aortic aneurysm) (HCC)    4.3cm (12/2018)   Celiac artery stenosis (HCC)    Degenerative joint disease of left hip    Heart failure with preserved ejection fraction (HCC)    EF 60-65%, 05/2018   History of non-ST elevation myocardial infarction (NSTEMI)    05/2018 with subsequent LHC and in the setting of acute aortic ulcer and hematoma   Hyperlipidemia    Hypertension    Internal carotid artery stenosis, left    80-99% 05/2018   Intramural hematoma of thoracic aorta (HCC)    05/2018   Mitral regurgitation    Mild by 05/2018 echo   Paroxysmal SVT (supraventricular tachycardia) (HCC)    05/2018   Peripheral vascular disease (HCC)    extensive vascular dz s/p b/l carotid endarterectomies (2014) and iliac stenting, AAA, aortic ulcer   [name] renal artery stenosis (HCC)    05/2018   Smoking hx    Quit ~8007   Status post bilateral carotid endarterectomy    Followed by  Vascular surgery with most recent images 05/2018 showing L ICA 80-99% stenosis   Subclavian artery stenosis, left (HCC)    05/2018   Past Surgical History:  Procedure Laterality Date   APPENDECTOMY     CARDIAC CATHETERIZATION     HIP SURGERY Left    x3   LEFT HEART CATH AND CORONARY ANGIOGRAPHY N/A 05/19/2018   Procedure: LEFT HEART CATH AND CORONARY ANGIOGRAPHY;  Surgeon: Claudene Victory ORN, MD;  Location: [facility]VASIVE [facility];  Service: Cardiovascular;  Laterality: N/A;   PERIPHERAL VASCULAR CATHETERIZATION [name] 02/02/2016   Procedure: Lower Extremity Angiography;  Surgeon: Selinda GORMAN Gu, MD;  Location: [facility] INVASIVE CV LAB;  Service: Cardiovascular;  Laterality: [name];   Family History  Problem Relation Age of Onset   Hypertension Mother    Stroke Mother    Hypertension Father    Stroke Father    Hypertension Sister    Hyperlipidemia Sister    Hodgkin's lymphoma Son    Heart disease Son    Kidney disease Son    Mental illness Neg Hx    Social History   Socioeconomic History   Marital status: Widowed     Spouse name: Not on file   Number of children: 2   Years of education: Not on file   Highest education level: GED or equivalent  Occupational History   Occupation: diabled  Tobacco Use   Smoking status: Former    Current packs/day: 0.00    Types: Cigarettes    Quit date: 02/02/1999    Years since quitting: 25.1   Smokeless tobacco: Never  Vaping Use   Vaping status: Never Used  Substance and Sexual Activity   Alcohol use: No   Drug use: No   Sexual activity: Not Currently  Other Topics Concern   Not on file  Social History Narrative   Pt lives alone, does not drive but sister drives her when needed.    Social Drivers of Corporate investment banker Strain: Low Risk  (03/06/2024)   Overall Financial Resource Strain (CARDIA)    Difficulty of Paying Living Expenses: Not hard at all  Food Insecurity: No Food Insecurity (03/06/2024)   Hunger Vital Sign    Worried About Running Out of Food in the Last Year: Never true    Ran Out of Food in the Last Year: Never true  Transportation Needs: No Transportation Needs (03/06/2024)   PRAPARE - Administrator, Civil Service (Medical): No    Lack of Transportation (Non-Medical): No  Physical Activity: Sufficiently Active (03/06/2024)   Exercise Vital Sign    Days of Exercise per Week: 7 days    Minutes of Exercise per Session: 30 min  Stress: No Stress Concern Present (03/06/2024)   [employer] of Occupational Health - Occupational Stress Questionnaire    Feeling of Stress: Only a little  Social Connections: Moderately Integrated (03/06/2024)   Social Connection and Isolation Panel    Frequency of Communication with Friends and Family: More than three times a week    Frequency of Social Gatherings with Friends and Family: Twice a week    Attends Religious Services: 1 to 4 times per year    Active Member of [employer] or Organizations: No    Attends Engineer, structural: More than 4 times per year    Marital Status: Widowed     Tobacco Counseling Counseling given: Not Answered    Clinical Intake:  Pre-visit preparation completed: Yes  Pain : 0-10  Pain Score: 3  Pain Type: Chronic pain Pain Location: Hip Pain Orientation: Left Pain Descriptors / Indicators: Aching Pain Onset: Today Pain Frequency: Constant     BMI - recorded: 28.34 Nutritional Status: BMI 25 -29 Overweight Nutritional Risks: None Diabetes: No  Lab Results  Component Value Date   HGBA1C 5.3 09/13/2022   HGBA1C 5 12/16/2020     How often do you need to have someone help you when you read instructions, pamphlets, or other written materials from your doctor or pharmacy?: 1 - Never What is the last grade level you completed in school?: GED  Interpreter Needed?: No  Information entered by :: Resean Brander,cma   Activities of Daily Living     03/06/2024   11:12 AM 02/13/2024    1:11 PM  In your present state of health, do you have any difficulty performing the following activities:  Hearing? 0 0  Vision? 0 0  Difficulty concentrating or making decisions? 0 0  Walking or climbing stairs? 0 1  Dressing or bathing? 0 0  Doing errands, shopping? 0 1  Preparing Food and eating ? N   Using the Toilet? N   In the past six months, have you accidently leaked urine? N   Do you have problems with loss of bowel control? N   Managing your Medications? N   Managing your Finances? N   Housekeeping or managing your Housekeeping? N     Patient Care Team: Bernardo Fend, DO as PCP - General (Internal Medicine) Darliss Rogue, MD as PCP - Cardiology (Cardiology)  I have updated your Care Teams any recent Medical Services you may have received from other providers in the past year.     Assessment:   This is a routine wellness examination for Elizabeth [name].  Hearing/Vision screen Hearing Screening - Comments:: No difficulties  Vision Screening - Comments:: Patient wears otc readers    Goals Addressed              This Visit's Progress    Prevent falls   On track    Continue to prevent falls with balance and strength training.        Depression Screen     03/06/2024   11:00 AM 02/13/2024    1:11 PM 12/12/2023   10:05 AM 09/17/2023    1:13 PM 08/15/2023   10:42 AM 06/13/2023   12:51 PM 03/07/2023   10:55 AM  PHQ 2/9 Scores  PHQ - 2 Score 0 0 0 0 0 0 0  PHQ- 9 Score 1          Fall Risk     03/06/2024   11:00 AM 02/13/2024    1:11 PM 12/12/2023   10:05 AM 09/17/2023    1:13 PM 08/15/2023   10:42 AM  Fall Risk   Falls in the past year? 0 0 0 0 0  Number falls in past yr: 0 0 0 0 0  Injury with Fall? 0 0 0  0  Risk for fall due to : No Fall Risks No Fall Risks  No Fall Risks No Fall Risks;Impaired mobility;Impaired balance/gait  Follow up Falls evaluation completed Falls evaluation completed  Falls evaluation completed Falls evaluation completed    MEDICARE RISK AT HOME:  Medicare Risk at Home Any stairs in or around the home?: Yes If so, are there any without handrails?: No Home free of loose throw rugs in walkways, pet beds, electrical cords, etc?: Yes Adequate lighting in your home to reduce  risk of falls?: Yes Life alert?: No Use of a cane, walker or w/c?: No Grab bars in the bathroom?: Yes Shower chair or bench in shower?: Yes Elevated toilet seat or a handicapped toilet?: Yes  TIMED UP AND GO:  Was the test performed?  No  Cognitive Function: 6CIT completed        03/06/2024   11:01 AM 03/07/2023   11:01 AM 05/01/2022    1:23 PM 04/26/2020    2:04 PM  6CIT Screen  What Year? 0 points 0 points 0 points 0 points  What month? 0 points 0 points 0 points 0 points  What time? 0 points 0 points 0 points 0 points  Count back from 20 0 points 0 points 0 points 0 points  Months in reverse 0 points 0 points 0 points 0 points  Repeat phrase 0 points 0 points 0 points 0 points  Total Score 0 points 0 points 0 points 0 points    Immunizations Immunization History  Administered Date(s)  Administered   Fluad Quad(high Dose 65+) 04/28/2020, 04/27/2021   Influenza, High Dose Seasonal PF 05/01/2017, 04/08/2018, 05/01/2022   Influenza-Unspecified 05/06/2014, 04/22/2019, 04/23/2023   PFIZER Comirnaty(Gray Top)Covid-19 Tri-Sucrose Vaccine 05/03/2020, 11/07/2020   PFIZER(Purple Top)SARS-COV-2 Vaccination 08/12/2019, 09/02/2019   PNEUMOCOCCAL CONJUGATE-20 08/14/2021   Pfizer Covid-19 Vaccine Bivalent Booster 21yrs & up 06/07/2021, 06/07/2021    Screening Tests Health Maintenance  Topic Date Due   DEXA SCAN  Never done   COVID-19 Vaccine (7 - 2024-25 season) 04/07/2023   INFLUENZA VACCINE  03/06/2024   Zoster Vaccines- Shingrix (1 of 2) 05/15/2024 (Originally 08/29/1990)   Medicare Annual Wellness (AWV)  03/06/2025   Pneumococcal Vaccine: 50+ Years  Completed   Hepatitis B Vaccines  Aged Out   HPV VACCINES  Aged Out   Meningococcal B Vaccine  Aged Out   DTaP/Tdap/Td  Discontinued   Hepatitis C Screening  Discontinued    Health Maintenance  Health Maintenance Due  Topic Date Due   DEXA SCAN  Never done   COVID-19 Vaccine (7 - 2024-25 season) 04/07/2023   INFLUENZA VACCINE  03/06/2024   Health Maintenance Items Addressed:patient declined health maintenance  Additional Screening:  Vision Screening: Recommended annual ophthalmology exams for early detection of glaucoma and other disorders of the eye. Would you like a referral to an eye doctor? No    Dental Screening: Recommended annual dental exams for proper oral hygiene  Community Resource Referral / Chronic Care Management: CRR required this visit?  No   CCM required this visit?  No   Plan:    I have personally reviewed and noted the following in the patient's chart:   Medical and social history Use of alcohol, tobacco or illicit drugs  Current medications and supplements including opioid prescriptions. Patient is not currently taking opioid prescriptions. Functional ability and status Nutritional  status Physical activity Advanced directives List of other physicians Hospitalizations, surgeries, and ER visits in previous 12 months Vitals Screenings to include cognitive, depression, and falls Referrals and appointments  In addition, I have reviewed and discussed with patient certain preventive protocols, quality metrics, and best practice recommendations. A written personalized care plan for preventive services as well as general preventive health recommendations were provided to patient.   Elizabeth [name], [place]   03/06/2024   After Visit Summary: (MyChart) Due to this being a telephonic visit, the after visit summary with patients personalized plan was offered to patient via MyChart   Notes: Nothing significant to report  at this time.

## 2024-03-10 ENCOUNTER — Ambulatory Visit: Attending: Nurse Practitioner | Admitting: Nurse Practitioner

## 2024-03-10 ENCOUNTER — Encounter: Payer: Self-pay | Admitting: Nurse Practitioner

## 2024-03-10 VITALS — BP 128/55 | HR 55 | Temp 97.3°F | Ht 61.0 in | Wt 150.0 lb

## 2024-03-10 DIAGNOSIS — Z9889 Other specified postprocedural states: Secondary | ICD-10-CM | POA: Insufficient documentation

## 2024-03-10 DIAGNOSIS — Z79899 Other long term (current) drug therapy: Secondary | ICD-10-CM | POA: Diagnosis not present

## 2024-03-10 DIAGNOSIS — M5442 Lumbago with sciatica, left side: Secondary | ICD-10-CM | POA: Diagnosis not present

## 2024-03-10 DIAGNOSIS — Z79891 Long term (current) use of opiate analgesic: Secondary | ICD-10-CM | POA: Diagnosis not present

## 2024-03-10 DIAGNOSIS — M1652 Unilateral post-traumatic osteoarthritis, left hip: Secondary | ICD-10-CM | POA: Insufficient documentation

## 2024-03-10 DIAGNOSIS — I739 Peripheral vascular disease, unspecified: Secondary | ICD-10-CM | POA: Insufficient documentation

## 2024-03-10 DIAGNOSIS — G894 Chronic pain syndrome: Secondary | ICD-10-CM | POA: Insufficient documentation

## 2024-03-10 DIAGNOSIS — M25552 Pain in left hip: Secondary | ICD-10-CM | POA: Insufficient documentation

## 2024-03-10 DIAGNOSIS — G8921 Chronic pain due to trauma: Secondary | ICD-10-CM | POA: Diagnosis not present

## 2024-03-10 DIAGNOSIS — G8929 Other chronic pain: Secondary | ICD-10-CM | POA: Diagnosis not present

## 2024-03-10 MED ORDER — HYDROCODONE-ACETAMINOPHEN 10-325 MG PO TABS: 1.0000 | ORAL_TABLET | ORAL | 0 refills | Status: DC | PRN
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
## 2024-03-10 NOTE — Progress Notes (Signed)
 Nursing Pain Medication Assessment:  Safety precautions to be maintained throughout the outpatient stay will include: orient to surroundings, keep bed in low position, maintain call bell within reach at all times, provide assistance with transfer out of bed and ambulation.  Medication Inspection Compliance: Pill count conducted under aseptic conditions, in front of the patient. Neither the pills nor the bottle was removed from the patient's sight at any time. Once count was completed pills were immediately returned to the patient in their original bottle.  Medication: Hydrocodone /APAP Pill/Patch Count: 68 of 180 pills/patches remain Pill/Patch Appearance: Markings consistent with prescribed medication Bottle Appearance: Standard pharmacy container. Clearly labeled. Filled Date: 7 / 53 / 2025 Last Medication intake:  TodaySafety precautions to be maintained throughout the outpatient stay will include: orient to surroundings, keep bed in low position, maintain call bell within reach at all times, provide assistance with transfer out of bed and ambulation.

## 2024-05-15 ENCOUNTER — Encounter (INDEPENDENT_AMBULATORY_CARE_PROVIDER_SITE_OTHER): Payer: Self-pay | Admitting: Vascular Surgery

## 2024-05-15 ENCOUNTER — Ambulatory Visit (INDEPENDENT_AMBULATORY_CARE_PROVIDER_SITE_OTHER)

## 2024-05-15 ENCOUNTER — Ambulatory Visit (INDEPENDENT_AMBULATORY_CARE_PROVIDER_SITE_OTHER): Admitting: Vascular Surgery

## 2024-05-15 VITALS — BP 144/77 | HR 60 | Resp 16 | Ht 61.0 in | Wt 156.0 lb

## 2024-05-15 DIAGNOSIS — I6523 Occlusion and stenosis of bilateral carotid arteries: Secondary | ICD-10-CM | POA: Diagnosis not present

## 2024-05-15 DIAGNOSIS — N183 Chronic kidney disease, stage 3 unspecified: Secondary | ICD-10-CM | POA: Diagnosis not present

## 2024-05-15 DIAGNOSIS — I7143 Infrarenal abdominal aortic aneurysm, without rupture: Secondary | ICD-10-CM

## 2024-05-15 DIAGNOSIS — E785 Hyperlipidemia, unspecified: Secondary | ICD-10-CM

## 2024-05-15 DIAGNOSIS — I10 Essential (primary) hypertension: Secondary | ICD-10-CM

## 2024-05-15 NOTE — Progress Notes (Signed)
 MRN : [id number]  Elizabeth [name] is a 83 y.o. (1940/08/10) female who presents with chief complaint of  Chief Complaint  Patient presents with   Follow-up    6 month follow up carotid/AAA  .  History of Present Illness: Patient returns today in follow up of Multiple vascular issues.  She is doing well today.  She has no new complaints.  She denies focal neurologic symptoms.  Specifically, the patient denies amaurosis fugax, speech or swallowing difficulties, or arm or leg weakness or numbness. Her carotid duplex actually shows better velocities today in the 1 to 39% range on the right in the 40 to 59% range on the left. She is also followed for her abdominal aortic aneurysm.  She denies any aneurysm related symptoms. Specifically, the patient denies new back or abdominal pain, or signs of peripheral embolization. Duplex today measures this at 5.0 cm in maximal diameter.  This is stable from her 5.1 cm 6 months ago.    Current Outpatient Medications  Medication Sig Dispense Refill   amLODipine  (NORVASC ) 10 MG tablet Take 1 tablet (10 mg total) by mouth daily. 90 tablet 1   aspirin  EC 81 MG tablet Take 1 tablet (81 mg total) by mouth daily. Swallow whole. 90 tablet 3   busPIRone  (BUSPAR ) 10 MG tablet Take 1 tablet (10 mg total) by mouth 2 (two) times daily. 180 tablet 1   cholecalciferol  (VITAMIN D ) 1000 units tablet Take 1,000 Units by mouth daily.     citalopram  (CELEXA ) 20 MG tablet TAKE 1 TABLET BY MOUTH EVERYDAY AT BEDTIME 90 tablet 1   clopidogrel  (PLAVIX ) 75 MG tablet Take 1 tablet (75 mg total) by mouth daily. 90 tablet 1   Cyanocobalamin (VITAMIN B-12 PO) Take 1 tablet by mouth daily.     famotidine  (PEPCID ) 40 MG tablet Take 1 tablet (40 mg total) by mouth daily as needed for heartburn or indigestion. 90 tablet 1   furosemide  (LASIX ) 20 MG tablet TAKE 1 TABLET BY MOUTH EVERY DAY 90 tablet 1   glucosamine-chondroitin 500-400 MG tablet Take 1 tablet by mouth daily.      HYDROcodone -acetaminophen  (NORCO) 10-325 MG tablet Take 1 tablet by mouth every 4 (four) hours as needed for severe pain (pain score 7-10). Must last 30 days. 180 tablet 0   HYDROcodone -acetaminophen  (NORCO) 10-325 MG tablet Take 1 tablet by mouth every 4 (four) hours as needed for severe pain (pain score 7-10). Must last 30 days. 180 tablet 0   [START ON 05/19/2024] HYDROcodone -acetaminophen  (NORCO) 10-325 MG tablet Take 1 tablet by mouth every 4 (four) hours as needed for severe pain (pain score 7-10). Must last 30 days. 180 tablet 0   isosorbide  mononitrate (IMDUR ) 30 MG 24 hr tablet TAKE 1/2 OF A TABLET (15 MG TOTAL) BY MOUTH DAILY 45 tablet 2   lisinopril  (ZESTRIL ) 40 MG tablet Take 1 tablet (40 mg total) by mouth daily. 90 tablet 3   metoprolol  tartrate (LOPRESSOR ) 100 MG tablet Take 1 tablet (100 mg total) by mouth 3 (three) times daily. 270 tablet 2   METRONIDAZOLE , TOPICAL, 0.75 % LOTN APPLY 1 APPLICATION. TOPICALLY 2 (TWO) TIMES DAILY. AFTER WASHING. 59 mL 0   Multiple Vitamin (MULTIVITAMIN) tablet Take 1 tablet by mouth daily.     nitroGLYCERIN  (NITROSTAT ) 0.4 MG SL tablet Place 1 tablet (0.4 mg total) under the tongue every 5 (five) minutes as needed for chest pain. 15 tablet 0   omeprazole  (PRILOSEC) 40 MG capsule Take 1  capsule (40 mg total) by mouth daily. 90 capsule 1   potassium citrate  (UROCIT-K ) 10 MEQ (1080 MG) SR tablet TAKE 1 TABLET BY MOUTH TWICE DAILY 180 tablet 0   rosuvastatin  (CRESTOR ) 40 MG tablet Take 1 tablet (40 mg total) by mouth daily. 90 tablet 3   No current facility-administered medications for this visit.    Past Medical History:  Diagnosis Date   3-vessel CAD    05/2018 LHC with a single remaining conduit which is the left main coronary artery supplying the LAD.  Both the circumflex and right coronary artery were occluded.  The distal left main, proximal LAD is aneurysmal and heavily calcified.  The LAD supplies well-formed collaterals to the PDA.  Not felt to  be candidate for revascularization and with recommendation for medical mgmt   AAA (abdominal aortic aneurysm)    4.3cm (12/2018)   Celiac artery stenosis    Degenerative joint disease of left hip    Heart failure with preserved ejection fraction (HCC)    EF 60-65%, 05/2018   History of non-ST elevation myocardial infarction (NSTEMI)    05/2018 with subsequent LHC and in the setting of acute aortic ulcer and hematoma   Hyperlipidemia    Hypertension    Internal carotid artery stenosis, left    80-99% 05/2018   Intramural hematoma of thoracic aorta (HCC)    05/2018   Mitral regurgitation    Mild by 05/2018 echo   Paroxysmal SVT (supraventricular tachycardia)    05/2018   Peripheral vascular disease    extensive vascular dz s/p b/l carotid endarterectomies (2014) and iliac stenting, AAA, aortic ulcer   Right renal artery stenosis    05/2018   Smoking hx    Quit ~1992   Status post bilateral carotid endarterectomy    Followed by Vascular surgery with most recent images 05/2018 showing L ICA 80-99% stenosis   Subclavian artery stenosis, left    05/2018    Past Surgical History:  Procedure Laterality Date   APPENDECTOMY     CARDIAC CATHETERIZATION     HIP SURGERY Left    x3   LEFT HEART CATH AND CORONARY ANGIOGRAPHY N/A 05/19/2018   Procedure: LEFT HEART CATH AND CORONARY ANGIOGRAPHY;  Surgeon: Claudene Victory ORN, MD;  Location: [facility];  Service: Cardiovascular;  Laterality: N/A;   PERIPHERAL VASCULAR CATHETERIZATION Right 02/02/2016   Procedure: Lower Extremity Angiography;  Surgeon: Selinda GORMAN Gu, MD;  Location: [facility] INVASIVE CV [facility];  Service: Cardiovascular;  Laterality: Right;     Social History   Tobacco Use   Smoking status: Former    Current packs/day: 0.00    Types: Cigarettes    Quit date: 02/02/1999    Years since quitting: 25.2   Smokeless tobacco: Never  Vaping Use   Vaping status: Never Used  Substance Use Topics   Alcohol use: No   Drug use: No       Family History  Problem Relation Age of Onset   Hypertension Mother    Stroke Mother    Hypertension Father    Stroke Father    Hypertension Sister    Hyperlipidemia Sister    Hodgkin's lymphoma Son    Heart disease Son    Kidney disease Son    Mental illness Neg Hx      Allergies  Allergen Reactions   Carvedilol  Other (See Comments)    intolerance   Codeine Other (See Comments)    Reaction: Unsure   Hydralazine  Other (  See Comments)    intolerance   Sulfa Antibiotics Other (See Comments)    Mouth blisters    REVIEW OF SYSTEMS (Negative unless checked)   Constitutional: [] Weight loss  [] Fever  [] Chills Cardiac: [] Chest pain   [] Chest pressure   [] Palpitations   [] Shortness of breath when laying flat   [x] Shortness of breath at rest   [x] Shortness of breath with exertion. 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   [] Asthma Neurologic:  [x] Dizziness  [] Blackouts   [] Seizures   [] History of stroke   [] History of TIA  [] Aphasia   [] Temporary blindness   [] Dysphagia   [] Weakness or numbness in arms   [] Weakness or numbness in legs Musculoskeletal:  [x] Arthritis   [] Joint swelling   [x] Joint pain   [] Low back pain Hematologic:  [] Easy bruising  [] Easy bleeding   [] Hypercoagulable state   [] Anemic  [] Hepatitis Gastrointestinal:  [] Blood in stool   [] Vomiting blood  [x] Gastroesophageal reflux/heartburn   [] Abdominal pain Genitourinary:  [x] Chronic kidney disease   [] Difficult urination  [] Frequent urination  [] Burning with urination   [] Hematuria Skin:  [] Rashes   [] Ulcers   [] Wounds Psychological:  [] History of anxiety   []  History of  major depression.  Physical Examination  BP (!) 144/77 (BP Location: Right Arm, Patient Position: Sitting, Cuff Size: Normal)   Pulse 60   Resp 16   Ht 5' 1 (1.549 m)   Wt 156 lb (70.8 kg)   BMI 29.48 kg/m  Gen:  WD/WN, NAD Head: High [place]/AT, No temporalis wasting. Ear/Nose/Throat: Hearing grossly intact, nares w/o erythema or drainage Eyes: Conjunctiva clear. Sclera non-icteric Neck: Supple.  Trachea midline Pulmonary:  Good air movement, no use of accessory muscles.  Cardiac: RRR, no JVD Vascular:  Vessel Right Left  Radial Palpable Palpable                                   Gastrointestinal: soft, non-tender/non-distended. No guarding/reflex.  Musculoskeletal: M/S 5/5 throughout.  No deformity or atrophy. No edema. Neurologic: Sensation grossly intact in extremities.  Symmetrical.  Speech is fluent.  Psychiatric: Judgment intact, Mood & affect appropriate for pt's clinical situation. Dermatologic: No rashes or ulcers noted.  No cellulitis or open wounds.      Labs Recent Results (from the past 2160 hours)  CBC w/Diff/Platelet     Status: Abnormal   Collection Time: 02/19/24 11:49 AM  Result Value Ref Range   WBC 6.7 3.8 - 10.8 Thousand/uL   RBC 3.99 3.80 - 5.10 Million/uL   Hemoglobin 13.8 11.7 - 15.5 g/dL   HCT 58.5 64.9 - 54.9 %   MCV 103.8 (H) 80.0 - 100.0 fL   MCH 34.6 (H) 27.0 - 33.0 pg   MCHC 33.3 32.0 - 36.0 g/dL    Comment: For adults, a slight decrease in the calculated MCHC value (in the range of 30 to 32 g/dL) is most likely not clinically significant; however, it should be interpreted with caution in correlation with other  red cell parameters and the patient's clinical condition.    RDW 12.4 11.0 - 15.0 %   Platelets 194 140 - 400 Thousand/uL   MPV 11.3 7.5 - 12.5 fL   Neutro Abs 3,504 1,500 - 7,800 cells/uL   Absolute Lymphocytes 2,245 850 - 3,900 cells/uL   Absolute Monocytes 804 200 - 950 cells/uL   Eosinophils Absolute 127 15 - 500  cells/uL   Basophils Absolute 20 0 - 200 cells/uL   Neutrophils Relative % 52.3 %   Total Lymphocyte 33.5 %   Monocytes Relative 12.0 %   Eosinophils Relative 1.9 %   Basophils Relative 0.3 %  Comprehensive Metabolic Panel (CMET)     Status: Abnormal   Collection Time: 02/19/24 11:49 AM  Result Value Ref Range   Glucose, Bld 79 65 - 99 mg/dL    Comment: .            Fasting reference interval .    BUN 25 7 - 25 mg/dL   Creat 8.74 (H) 9.39 - 0.95 mg/dL   eGFR 43 (L) > OR = 60 mL/min/1.38m2   BUN/Creatinine Ratio 20 6 - 22 (calc)   Sodium 140 135 - 146 mmol/L   Potassium 5.3 3.5 - 5.3 mmol/L   Chloride 104 98 - 110 mmol/L   CO2 29 20 - 32 mmol/L   Calcium  9.5 8.6 - 10.4 mg/dL   Total Protein 7.5 6.1 - 8.1 g/dL   Albumin 4.3 3.6 - 5.1 g/dL   Globulin 3.2 1.9 - 3.7 g/dL (calc)   AG Ratio 1.3 1.0 - 2.5 (calc)   Total Bilirubin 0.3 0.2 - 1.2 mg/dL   Alkaline phosphatase (APISO) 84 37 - 153 U/L   AST 19 10 - 35 U/L   ALT 12 6 - 29 U/L  Vitamin B12     Status: Abnormal   Collection Time: 02/19/24 11:49 AM  Result Value Ref Range   Vitamin B-12 1,690 (H) 200 - 1,100 pg/mL  Lipid Profile     Status: Abnormal   Collection Time: 02/19/24 11:49 AM  Result Value Ref Range   Cholesterol 138 <200 mg/dL   HDL 36 (L) > OR = 50 mg/dL   Triglycerides 732 (H) <150 mg/dL    Comment: . If a non-fasting specimen was collected, consider repeat triglyceride testing on a fasting specimen if clinically indicated.  Veatrice et al. J. of Clin. Lipidol. 2015;9:129-169. SABRA    LDL Cholesterol (Calc) 67 mg/dL (calc)    Comment: Reference range: <100 . Desirable range <100 mg/dL for primary prevention;   <70 mg/dL for patients with CHD or diabetic patients  with > or = 2 CHD risk factors. SABRA LDL-C is now calculated using the Martin-Hopkins  calculation, which is a validated novel method providing  better accuracy than the Friedewald equation in the  estimation of LDL-C.  Gladis APPLETHWAITE et al. SANDREA.  7986;689(80): 2061-2068  (http://education.[facility].com/faq/FAQ164)    Total CHOL/HDL Ratio 3.8 <5.0 (calc)   Non-HDL Cholesterol (Calc) 102 <130 mg/dL (calc)    Comment: For patients with diabetes plus 1 major ASCVD risk  factor, treating to a non-HDL-C goal of <100 mg/dL  (LDL-C of <29 mg/dL) is considered a therapeutic  option.     Radiology No results found.  Assessment/Plan  Carotid artery stenosis Her carotid duplex actually shows better velocities today in the 1 to 39% range on the right in the 40 to 59% range on the left.  No role for intervention.  Continue  Crestor , aspirin , and Plavix .  Recheck in 6 months.  AAA (abdominal aortic aneurysm) Duplex today measures this at 5.0 cm in maximal diameter.  This is stable from her 5.1 cm 6 months ago.  She is fairly adamant she does not want to have anything done and at this size she is certainly reasonable to consider repair, but the annual risk of rupture is still 1 to 2%.  She will plan to continue to follow-up in 6 months with duplex unless problems develop in the interim.  Essential hypertension blood pressure control important in reducing the progression of atherosclerotic disease and AAA growth. On appropriate oral medications.     CKD (chronic kidney disease) stage 3, GFR 30-59 ml/min (HCC) Limit contrast use and hydrate with any use of contrast.   Hyperlipidemia lipid control important in reducing the progression of atherosclerotic disease. Continue statin therapy  Selinda Gu, MD  05/15/2024 12:20 PM    This note was created with Dragon medical transcription system.  Any errors from dictation are purely unintentional

## 2024-05-15 NOTE — Assessment & Plan Note (Signed)
 Duplex today measures this at 5.0 cm in maximal diameter.  This is stable from her 5.1 cm 6 months ago.  She is fairly adamant she does not want to have anything done and at this size she is certainly reasonable to consider repair, but the annual risk of rupture is still 1 to 2%.  She will plan to continue to follow-up in 6 months with duplex unless problems develop in the interim.

## 2024-05-15 NOTE — Assessment & Plan Note (Signed)
 Her carotid duplex actually shows better velocities today in the 1 to 39% range on the right in the 40 to 59% range on the left.  No role for intervention.  Continue Crestor , aspirin , and Plavix .  Recheck in 6 months.

## 2024-06-08 ENCOUNTER — Other Ambulatory Visit: Payer: Self-pay | Admitting: Internal Medicine

## 2024-06-08 DIAGNOSIS — I10 Essential (primary) hypertension: Secondary | ICD-10-CM

## 2024-06-09 NOTE — Telephone Encounter (Signed)
 Requested Prescriptions  Pending Prescriptions Disp Refills   potassium citrate  (UROCIT-K ) 10 MEQ (1080 MG) SR tablet [Pharmacy Med Name: POTASSIUM CIT 1080MG  ( ) TABS] 180 tablet 0    Sig: TAKE 1 TABLET BY MOUTH TWICE DAILY     Endocrinology:  Minerals - Potassium Citrate  Failed - 06/09/2024  4:27 PM      Failed - Cr in normal range and within 120 days    Creat  Date Value Ref Range Status  02/19/2024 1.25 (H) 0.60 - 0.95 mg/dL Final         Failed - Valid encounter within last 4 months    Recent Outpatient Visits           3 months ago Essential (primary) hypertension   [facility] Bernardo Fend, DO              Failed - Urinalysis completed in last 4 months.    Specific Gravity, Urine  Date Value Ref Range Status  05/17/2018 1.017 1.005 - 1.030 Final   Glucose, UA  Date Value Ref Range Status  05/17/2018 NEGATIVE NEGATIVE mg/dL Final   Urobilinogen, UA  Date Value Ref Range Status  01/01/2011 0.2 0.0 - 1.0 mg/dL Final   Protein, ur  Date Value Ref Range Status  05/17/2018 30 (A) NEGATIVE mg/dL Final   Total Protein  Date Value Ref Range Status  02/19/2024 7.5 6.1 - 8.1 g/dL Final   Nitrite  Date Value Ref Range Status  05/17/2018 NEGATIVE NEGATIVE Final   RBC / HPF  Date Value Ref Range Status  05/17/2018 0-5 0 - 5 RBC/hpf Final   WBC, UA  Date Value Ref Range Status  05/17/2018 0-5 0 - 5 WBC/hpf Final         Passed - CO2 in normal range and within 120 days    CO2  Date Value Ref Range Status  02/19/2024 29 20 - 32 mmol/L Final   Co2  Date Value Ref Range Status  10/02/2012 24 21 - 32 mmol/L Final   Bicarbonate  Date Value Ref Range Status  05/21/2018 26.0 20.0 - 28.0 mmol/L Final         Passed - Cl in normal range and within 120 days    Chloride  Date Value Ref Range Status  02/19/2024 104 98 - 110 mmol/L Final  10/02/2012 105 98 - 107 mmol/L Final         Passed - K in normal range and within  120 days    Potassium  Date Value Ref Range Status  02/19/2024 5.3 3.5 - 5.3 mmol/L Final  10/02/2012 3.5 3.5 - 5.1 mmol/L Final         Passed - Na in normal range and within 120 days    Sodium  Date Value Ref Range Status  02/19/2024 140 135 - 146 mmol/L Final  05/29/2021 144 134 - 144 mmol/L Final  10/02/2012 137 136 - 145 mmol/L Final         Passed - WBC in normal range and within 120 days    WBC  Date Value Ref Range Status  02/19/2024 6.7 3.8 - 10.8 Thousand/uL Final         Passed - HGB in normal range and within 120 days    Hemoglobin  Date Value Ref Range Status  02/19/2024 13.8 11.7 - 15.5 g/dL Final   HGB  Date Value Ref Range Status  10/02/2012 15.3 12.0 - 16.0 g/dL Final  Passed - HCT in normal range and within 120 days    HCT  Date Value Ref Range Status  02/19/2024 41.4 35.0 - 45.0 % Final  10/02/2012 44.6 35.0 - 47.0 % Final         Passed - PLT in normal range and within 120 days    Platelets  Date Value Ref Range Status  02/19/2024 194 140 - 400 Thousand/uL Final   Platelet  Date Value Ref Range Status  10/02/2012 262 150 - 440 x10 3/mm 3 Final

## 2024-06-10 ENCOUNTER — Ambulatory Visit: Attending: Nurse Practitioner | Admitting: Nurse Practitioner

## 2024-06-10 ENCOUNTER — Encounter: Payer: Self-pay | Admitting: Nurse Practitioner

## 2024-06-10 VITALS — BP 146/61 | HR 62 | Temp 97.3°F | Ht 61.0 in | Wt 156.0 lb

## 2024-06-10 DIAGNOSIS — M25552 Pain in left hip: Secondary | ICD-10-CM | POA: Diagnosis present

## 2024-06-10 DIAGNOSIS — G8929 Other chronic pain: Secondary | ICD-10-CM | POA: Diagnosis present

## 2024-06-10 DIAGNOSIS — Z9889 Other specified postprocedural states: Secondary | ICD-10-CM | POA: Diagnosis present

## 2024-06-10 DIAGNOSIS — Z79891 Long term (current) use of opiate analgesic: Secondary | ICD-10-CM | POA: Diagnosis not present

## 2024-06-10 DIAGNOSIS — M1652 Unilateral post-traumatic osteoarthritis, left hip: Secondary | ICD-10-CM | POA: Diagnosis present

## 2024-06-10 DIAGNOSIS — Z79899 Other long term (current) drug therapy: Secondary | ICD-10-CM | POA: Insufficient documentation

## 2024-06-10 DIAGNOSIS — G8921 Chronic pain due to trauma: Secondary | ICD-10-CM | POA: Insufficient documentation

## 2024-06-10 DIAGNOSIS — G894 Chronic pain syndrome: Secondary | ICD-10-CM | POA: Insufficient documentation

## 2024-06-10 MED ORDER — HYDROCODONE-ACETAMINOPHEN 10-325 MG PO TABS: 1.0000 | ORAL_TABLET | ORAL | 0 refills | Status: DC | PRN

## 2024-06-10 MED ORDER — NALOXONE HCL 4 MG/0.1ML NA LIQD: 1.0000 | NASAL | 1 refills | Status: AC | PRN
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
## 2024-06-10 NOTE — Progress Notes (Signed)
 Nursing Pain Medication Assessment:  Safety precautions to be maintained throughout the outpatient stay will include: orient to surroundings, keep bed in low position, maintain call bell within reach at all times, provide assistance with transfer out of bed and ambulation.  Medication Inspection Compliance: Pill count conducted under aseptic conditions, in front of the patient. Neither the pills nor the bottle was removed from the patient's sight at any time. Once count was completed pills were immediately returned to the patient in their original bottle.  Medication: Hydrocodone /APAP Pill/Patch Count: 56 of 180 pills/patches remain Pill/Patch Appearance: Markings consistent with prescribed medication Bottle Appearance: Standard pharmacy container. Clearly labeled. Filled Date: 19 / 14 / 2025 Last Medication intake:  TodaySafety precautions to be maintained throughout the outpatient stay will include: orient to surroundings, keep bed in low position, maintain call bell within reach at all times, provide assistance with transfer out of bed and ambulation.

## 2024-06-13 LAB — TOXASSURE SELECT 13 (MW), URINE

## 2024-08-03 ENCOUNTER — Other Ambulatory Visit: Payer: Self-pay | Admitting: Internal Medicine

## 2024-08-03 DIAGNOSIS — K219 Gastro-esophageal reflux disease without esophagitis: Secondary | ICD-10-CM

## 2024-08-04 NOTE — Telephone Encounter (Signed)
 Requested Prescriptions  Pending Prescriptions Disp Refills   famotidine  (PEPCID ) 40 MG tablet [Pharmacy Med Name: FAMOTIDINE  40MG  TABLETS] 90 tablet 1    Sig: TAKE 1 TABLET(40 MG) BY MOUTH DAILY AS NEEDED FOR HEARTBURN OR INDIGESTION     Gastroenterology:  H2 Antagonists Passed - 08/04/2024  2:26 PM      Passed - Valid encounter within last 12 months    Recent Outpatient Visits           5 months ago Essential (primary) hypertension   [facility] Bernardo Fend, [place]

## 2024-08-14 ENCOUNTER — Other Ambulatory Visit: Payer: Self-pay | Admitting: Internal Medicine

## 2024-08-14 DIAGNOSIS — K219 Gastro-esophageal reflux disease without esophagitis: Secondary | ICD-10-CM

## 2024-08-14 DIAGNOSIS — F419 Anxiety disorder, unspecified: Secondary | ICD-10-CM

## 2024-08-14 DIAGNOSIS — I10 Essential (primary) hypertension: Secondary | ICD-10-CM

## 2024-08-14 NOTE — Telephone Encounter (Signed)
 Requested Prescriptions  Pending Prescriptions Disp Refills   amLODipine  (NORVASC ) 10 MG tablet [Pharmacy Med Name: AMLODIPINE  BESYLATE 10MG TABLETS] 90 tablet 0    Sig: TAKE 1 TABLET(10 MG) BY MOUTH DAILY     Cardiovascular: Calcium  Channel Blockers 2 Failed - 08/14/2024  4:05 PM      Failed - Last BP in normal range    BP Readings from Last 1 Encounters:  06/10/24 (!) 146/61         Passed - Last Heart Rate in normal range    Pulse Readings from Last 1 Encounters:  06/10/24 62         Passed - Valid encounter within last 6 months    Recent Outpatient Visits           6 months ago Essential (primary) hypertension   [facility] Bernardo Fend, [place]               omeprazole  (PRILOSEC) 40 MG capsule [Pharmacy Med Name: OMEPRAZOLE  40MG  CAPSULES] 90 capsule 0    Sig: TAKE 1 CAPSULE(40 MG) BY MOUTH DAILY     Gastroenterology: Proton Pump Inhibitors Passed - 08/14/2024  4:05 PM      Passed - Valid encounter within last 12 months    Recent Outpatient Visits           6 months ago Essential (primary) hypertension   [facility] Bernardo Fend, [place]               busPIRone  (BUSPAR ) 10 MG tablet [Pharmacy Med Name: BUSPIRONE  10MG  TABLETS] 180 tablet 0    Sig: TAKE 1 TABLET(10 MG) BY MOUTH TWICE DAILY     Psychiatry: Anxiolytics/Hypnotics - Non-controlled Passed - 08/14/2024  4:05 PM      Passed - Valid encounter within last 12 months    Recent Outpatient Visits           6 months ago Essential (primary) hypertension   [facility] Bernardo Fend, [place]

## 2024-08-15 ENCOUNTER — Other Ambulatory Visit: Payer: Self-pay | Admitting: Internal Medicine

## 2024-08-15 DIAGNOSIS — F419 Anxiety disorder, unspecified: Secondary | ICD-10-CM

## 2024-08-16 ENCOUNTER — Other Ambulatory Visit: Payer: Self-pay | Admitting: Internal Medicine

## 2024-08-16 DIAGNOSIS — I739 Peripheral vascular disease, unspecified: Secondary | ICD-10-CM

## 2024-08-17 ENCOUNTER — Other Ambulatory Visit: Payer: Self-pay

## 2024-08-17 ENCOUNTER — Encounter: Payer: Self-pay | Admitting: Internal Medicine

## 2024-08-17 ENCOUNTER — Ambulatory Visit: Admitting: Internal Medicine

## 2024-08-17 VITALS — BP 132/82 | HR 61 | Temp 98.3°F | Resp 16 | Ht 61.0 in | Wt 160.7 lb

## 2024-08-17 DIAGNOSIS — I10 Essential (primary) hypertension: Secondary | ICD-10-CM

## 2024-08-17 DIAGNOSIS — N183 Chronic kidney disease, stage 3 unspecified: Secondary | ICD-10-CM | POA: Diagnosis not present

## 2024-08-17 DIAGNOSIS — R600 Localized edema: Secondary | ICD-10-CM | POA: Diagnosis not present

## 2024-08-17 DIAGNOSIS — E539 Vitamin B deficiency, unspecified: Secondary | ICD-10-CM | POA: Diagnosis not present

## 2024-08-17 DIAGNOSIS — I25118 Atherosclerotic heart disease of native coronary artery with other forms of angina pectoris: Secondary | ICD-10-CM | POA: Diagnosis not present

## 2024-08-17 DIAGNOSIS — K219 Gastro-esophageal reflux disease without esophagitis: Secondary | ICD-10-CM

## 2024-08-17 DIAGNOSIS — I739 Peripheral vascular disease, unspecified: Secondary | ICD-10-CM

## 2024-08-17 DIAGNOSIS — F419 Anxiety disorder, unspecified: Secondary | ICD-10-CM

## 2024-08-17 MED ORDER — ISOSORBIDE MONONITRATE ER 30 MG PO TB24: ORAL_TABLET | ORAL | 2 refills | Status: AC

## 2024-08-17 MED ORDER — CLOPIDOGREL BISULFATE 75 MG PO TABS: 75.0000 mg | ORAL_TABLET | Freq: Every day | ORAL | 1 refills | Status: AC

## 2024-08-17 MED ORDER — AMLODIPINE BESYLATE 10 MG PO TABS: 10.0000 mg | ORAL_TABLET | Freq: Every day | ORAL | 1 refills | Status: AC

## 2024-08-17 MED ORDER — CITALOPRAM HYDROBROMIDE 20 MG PO TABS: ORAL_TABLET | ORAL | 1 refills | Status: AC

## 2024-08-17 MED ORDER — OMEPRAZOLE 40 MG PO CPDR: 40.0000 mg | DELAYED_RELEASE_CAPSULE | Freq: Every day | ORAL | 1 refills | Status: AC

## 2024-08-17 MED ORDER — METOPROLOL TARTRATE 100 MG PO TABS: 100.0000 mg | ORAL_TABLET | Freq: Three times a day (TID) | ORAL | 2 refills | Status: AC

## 2024-08-17 MED ORDER — FUROSEMIDE 20 MG PO TABS: 20.0000 mg | ORAL_TABLET | Freq: Every day | ORAL | 0 refills | Status: AC | PRN

## 2024-08-17 MED ORDER — LISINOPRIL 40 MG PO TABS: 40.0000 mg | ORAL_TABLET | Freq: Every day | ORAL | 3 refills | Status: AC

## 2024-08-17 MED ORDER — BUSPIRONE HCL 10 MG PO TABS: 10.0000 mg | ORAL_TABLET | Freq: Every day | ORAL | 1 refills | Status: AC

## 2024-08-17 NOTE — Telephone Encounter (Signed)
 Requested Prescriptions  Refused Prescriptions Disp Refills   clopidogrel  (PLAVIX ) 75 MG tablet [Pharmacy Med Name: CLOPIDOGREL  75MG  TABLETS] 90 tablet 1    Sig: TAKE 1 TABLET(75 MG) BY MOUTH DAILY     Hematology: Antiplatelets - clopidogrel  Failed - 08/17/2024  4:31 PM      Failed - Cr in normal range and within 360 days    Creat  Date Value Ref Range Status  02/19/2024 1.25 (H) 0.60 - 0.95 mg/dL Final         Passed - HCT in normal range and within 180 days    HCT  Date Value Ref Range Status  02/19/2024 41.4 35.0 - 45.0 % Final  10/02/2012 44.6 35.0 - 47.0 % Final         Passed - HGB in normal range and within 180 days    Hemoglobin  Date Value Ref Range Status  02/19/2024 13.8 11.7 - 15.5 g/dL Final   HGB  Date Value Ref Range Status  10/02/2012 15.3 12.0 - 16.0 g/dL Final         Passed - PLT in normal range and within 180 days    Platelets  Date Value Ref Range Status  02/19/2024 194 140 - 400 Thousand/uL Final   Platelet  Date Value Ref Range Status  10/02/2012 262 150 - 440 x10 3/mm 3 Final         Passed - Valid encounter within last 6 months    Recent Outpatient Visits           Today Essential (primary) hypertension   [facility] Bernardo Fend, [place]   6 months ago Essential (primary) hypertension   [facility] Bernardo Fend, [place]

## 2024-08-17 NOTE — Telephone Encounter (Signed)
 Duplicate request, refilled 08/17/24.  Requested Prescriptions  Pending Prescriptions Disp Refills   citalopram  (CELEXA ) 20 MG tablet [Pharmacy Med Name: CITALOPRAM  20MG  TABLETS] 90 tablet 1    Sig: TAKE 1 TABLET BY MOUTH EVERY DAY AT BEDTIME     Psychiatry:  Antidepressants - SSRI Passed - 08/17/2024  3:33 PM      Passed - Valid encounter within last 6 months    Recent Outpatient Visits           Today Essential (primary) hypertension   [facility] Bernardo Fend, [place]   6 months ago Essential (primary) hypertension   [facility] Bernardo Fend, [place]
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
## 2024-09-02 ENCOUNTER — Other Ambulatory Visit: Payer: Self-pay | Admitting: Internal Medicine

## 2024-09-02 DIAGNOSIS — I10 Essential (primary) hypertension: Secondary | ICD-10-CM

## 2024-09-03 NOTE — Telephone Encounter (Signed)
 Requested Prescriptions  Pending Prescriptions Disp Refills   potassium citrate  (UROCIT-K ) 10 MEQ (1080 MG) SR tablet [Pharmacy Med Name: POTASSIUM CIT 1080MG  ( ) TABS] 180 tablet 0    Sig: TAKE 1 TABLET BY MOUTH TWICE DAILY     Endocrinology:  Minerals - Potassium Citrate  Failed - 09/03/2024  2:29 PM      Failed - CO2 in normal range and within 120 days    CO2  Date Value Ref Range Status  02/19/2024 29 20 - 32 mmol/L Final   Co2  Date Value Ref Range Status  10/02/2012 24 21 - 32 mmol/L Final   Bicarbonate  Date Value Ref Range Status  05/21/2018 26.0 20.0 - 28.0 mmol/L Final         Failed - Cl in normal range and within 120 days    Chloride  Date Value Ref Range Status  02/19/2024 104 98 - 110 mmol/L Final  10/02/2012 105 98 - 107 mmol/L Final         Failed - Cr in normal range and within 120 days    Creat  Date Value Ref Range Status  02/19/2024 1.25 (H) 0.60 - 0.95 mg/dL Final         Failed - K in normal range and within 120 days    Potassium  Date Value Ref Range Status  02/19/2024 5.3 3.5 - 5.3 mmol/L Final  10/02/2012 3.5 3.5 - 5.1 mmol/L Final         Failed - Na in normal range and within 120 days    Sodium  Date Value Ref Range Status  02/19/2024 140 135 - 146 mmol/L Final  05/29/2021 144 134 - 144 mmol/L Final  10/02/2012 137 136 - 145 mmol/L Final         Failed - WBC in normal range and within 120 days    WBC  Date Value Ref Range Status  02/19/2024 6.7 3.8 - 10.8 Thousand/uL Final         Failed - HGB in normal range and within 120 days    Hemoglobin  Date Value Ref Range Status  02/19/2024 13.8 11.7 - 15.5 g/dL Final   HGB  Date Value Ref Range Status  10/02/2012 15.3 12.0 - 16.0 g/dL Final         Failed - HCT in normal range and within 120 days    HCT  Date Value Ref Range Status  02/19/2024 41.4 35.0 - 45.0 % Final  10/02/2012 44.6 35.0 - 47.0 % Final         Failed - PLT in normal range and within 120 days    Platelets   Date Value Ref Range Status  02/19/2024 194 140 - 400 Thousand/uL Final   Platelet  Date Value Ref Range Status  10/02/2012 262 150 - 440 x10 3/mm 3 Final         Failed - Valid encounter within last 4 months    Recent Outpatient Visits           2 weeks ago Essential (primary) hypertension   [place] [facility] Bernardo Fend, DO   6 months ago Essential (primary) hypertension   [facility] Bernardo Fend, DO              Failed - Urinalysis completed in last 4 months.    Specific Gravity, Urine  Date Value Ref Range Status  05/17/2018 1.017 1.005 - 1.030 Final   Glucose, UA  Date Value Ref Range Status  05/17/2018 NEGATIVE NEGATIVE mg/dL Final   Urobilinogen, UA  Date Value Ref Range Status  01/01/2011 0.2 0.0 - 1.0 mg/dL Final   Protein, ur  Date Value Ref Range Status  05/17/2018 30 (A) NEGATIVE mg/dL Final   Total Protein  Date Value Ref Range Status  02/19/2024 7.5 6.1 - 8.1 g/dL Final   Nitrite  Date Value Ref Range Status  05/17/2018 NEGATIVE NEGATIVE Final   RBC / HPF  Date Value Ref Range Status  05/17/2018 0-5 0 - 5 RBC/hpf Final   WBC, UA  Date Value Ref Range Status  05/17/2018 0-5 0 - 5 WBC/hpf Final

## 2024-09-07 DIAGNOSIS — Z79899 Other long term (current) drug therapy: Secondary | ICD-10-CM | POA: Insufficient documentation

## 2024-09-07 DIAGNOSIS — G894 Chronic pain syndrome: Secondary | ICD-10-CM | POA: Insufficient documentation

## 2024-09-09 ENCOUNTER — Ambulatory Visit: Admitting: Nurse Practitioner

## 2024-09-09 ENCOUNTER — Encounter: Payer: Self-pay | Admitting: Nurse Practitioner

## 2024-09-09 VITALS — BP 170/72 | HR 58 | Temp 96.8°F | Resp 16 | Ht 61.0 in | Wt 160.0 lb

## 2024-09-09 DIAGNOSIS — Z79899 Other long term (current) drug therapy: Secondary | ICD-10-CM

## 2024-09-09 DIAGNOSIS — I25118 Atherosclerotic heart disease of native coronary artery with other forms of angina pectoris: Secondary | ICD-10-CM | POA: Diagnosis not present

## 2024-09-09 DIAGNOSIS — G894 Chronic pain syndrome: Secondary | ICD-10-CM

## 2024-09-09 DIAGNOSIS — I739 Peripheral vascular disease, unspecified: Secondary | ICD-10-CM

## 2024-09-09 DIAGNOSIS — Z79891 Long term (current) use of opiate analgesic: Secondary | ICD-10-CM | POA: Diagnosis not present

## 2024-09-09 DIAGNOSIS — G8921 Chronic pain due to trauma: Secondary | ICD-10-CM

## 2024-09-09 DIAGNOSIS — M5442 Lumbago with sciatica, left side: Secondary | ICD-10-CM

## 2024-09-09 DIAGNOSIS — M25551 Pain in right hip: Secondary | ICD-10-CM

## 2024-09-09 DIAGNOSIS — Z9889 Other specified postprocedural states: Secondary | ICD-10-CM

## 2024-09-09 DIAGNOSIS — G8929 Other chronic pain: Secondary | ICD-10-CM

## 2024-09-09 DIAGNOSIS — M25552 Pain in left hip: Secondary | ICD-10-CM

## 2024-09-09 DIAGNOSIS — M1652 Unilateral post-traumatic osteoarthritis, left hip: Secondary | ICD-10-CM | POA: Diagnosis not present

## 2024-09-09 MED ORDER — HYDROCODONE-ACETAMINOPHEN 10-325 MG PO TABS: 1.0000 | ORAL_TABLET | ORAL | 0 refills | Status: AC | PRN

## 2024-09-09 NOTE — Patient Instructions (Signed)

## 2024-09-09 NOTE — Progress Notes (Signed)
 Nursing Pain Medication Assessment:  Safety precautions to be maintained throughout the outpatient stay will include: orient to surroundings, keep bed in low position, maintain call bell within reach at all times, provide assistance with transfer out of bed and ambulation.  Medication Inspection Compliance: Pill count conducted under aseptic conditions, in front of the patient. Neither the pills nor the bottle was removed from the patient's sight at any time. Once count was completed pills were immediately returned to the patient in their original bottle.  Medication: Hydrocodone /APAP Pill/Patch Count: 55 of 180 pills/patches remain Pill/Patch Appearance: Markings consistent with prescribed medication Bottle Appearance: Standard pharmacy container. Clearly labeled. Filled Date: 01 / 12 / 2026 Last Medication intake:  Today

## 2024-11-13 ENCOUNTER — Ambulatory Visit (INDEPENDENT_AMBULATORY_CARE_PROVIDER_SITE_OTHER): Admitting: Vascular Surgery

## 2024-11-13 ENCOUNTER — Encounter (INDEPENDENT_AMBULATORY_CARE_PROVIDER_SITE_OTHER)

## 2024-11-13 ENCOUNTER — Other Ambulatory Visit (INDEPENDENT_AMBULATORY_CARE_PROVIDER_SITE_OTHER)

## 2024-12-01 ENCOUNTER — Encounter: Admitting: Nurse Practitioner

## 2025-02-15 ENCOUNTER — Ambulatory Visit: Admitting: Internal Medicine

## 2025-03-12 ENCOUNTER — Ambulatory Visit
